# Patient Record
Sex: Female | Born: 1950 | Race: White | Hispanic: No | Marital: Married | Smoking: Former smoker
Health system: Southern US, Community
[De-identification: ages and names within clinical notes are randomized; demographics above are authoritative.]

## PROBLEM LIST (undated history)

## (undated) DIAGNOSIS — C4490 Unspecified malignant neoplasm of skin, unspecified: Secondary | ICD-10-CM

## (undated) DIAGNOSIS — C50411 Malignant neoplasm of upper-outer quadrant of right female breast: Secondary | ICD-10-CM

## (undated) DIAGNOSIS — I2510 Atherosclerotic heart disease of native coronary artery without angina pectoris: Secondary | ICD-10-CM

## (undated) DIAGNOSIS — Z9221 Personal history of antineoplastic chemotherapy: Secondary | ICD-10-CM

## (undated) DIAGNOSIS — E119 Type 2 diabetes mellitus without complications: Secondary | ICD-10-CM

## (undated) DIAGNOSIS — C50919 Malignant neoplasm of unspecified site of unspecified female breast: Secondary | ICD-10-CM

## (undated) DIAGNOSIS — R011 Cardiac murmur, unspecified: Secondary | ICD-10-CM

## (undated) DIAGNOSIS — Z923 Personal history of irradiation: Secondary | ICD-10-CM

## (undated) DIAGNOSIS — I10 Essential (primary) hypertension: Secondary | ICD-10-CM

## (undated) HISTORY — DX: Malignant neoplasm of upper-outer quadrant of right female breast: C50.411

## (undated) HISTORY — DX: Essential (primary) hypertension: I10

## (undated) HISTORY — DX: Type 2 diabetes mellitus without complications: E11.9

## (undated) HISTORY — DX: Unspecified malignant neoplasm of skin, unspecified: C44.90

---

## 2012-08-04 ENCOUNTER — Telehealth (HOSPITAL_COMMUNITY): Payer: Self-pay | Admitting: Cardiac Rehabilitation

## 2012-08-22 ENCOUNTER — Other Ambulatory Visit: Payer: Self-pay | Admitting: Family Medicine

## 2012-08-22 DIAGNOSIS — R011 Cardiac murmur, unspecified: Secondary | ICD-10-CM

## 2012-08-27 ENCOUNTER — Ambulatory Visit (HOSPITAL_COMMUNITY): Payer: Self-pay

## 2012-09-03 ENCOUNTER — Encounter: Payer: Self-pay | Admitting: Internal Medicine

## 2012-09-03 ENCOUNTER — Ambulatory Visit (INDEPENDENT_AMBULATORY_CARE_PROVIDER_SITE_OTHER): Payer: PRIVATE HEALTH INSURANCE | Admitting: Internal Medicine

## 2012-09-03 VITALS — BP 148/86 | HR 78 | Ht 62.0 in | Wt 183.1 lb

## 2012-09-03 DIAGNOSIS — R011 Cardiac murmur, unspecified: Secondary | ICD-10-CM

## 2012-09-03 NOTE — Patient Instructions (Addendum)
We will call you with echo appoinment.\ Also an appointment with Dr Rennis Golden

## 2012-09-04 ENCOUNTER — Encounter: Payer: Self-pay | Admitting: Internal Medicine

## 2012-09-04 DIAGNOSIS — R011 Cardiac murmur, unspecified: Secondary | ICD-10-CM | POA: Insufficient documentation

## 2012-09-04 NOTE — Progress Notes (Signed)
  OFFICE NOTE  Chief Complaint:  Murmur  Primary Care Physician: Delorse Lek, MD  HPI:  Darlene Kelly is a pleasant 62 year old female, with a history of heart murmur dating back to her youth. Recently she was referred by Dr. Electa Sniff for worsening intensity of the murmur and some lower extremity edema which is new.  She also has a history of dyslipidemia, hypertension and diabetes. She has not reported any new or worsening shortness of breath, chest pain, palpitations, presyncope, or syncopal symptoms. She denies any history of orthopnea, PND or other heart failure symptoms. There is no family history of sudden cardiac death.  PMHx:  History reviewed. No pertinent past medical history.  History reviewed. No pertinent past surgical history.  FAMHx:  No family history on file.  SOCHx:   reports that she quit smoking about 26 years ago. Her smoking use included Cigarettes. She has a 6 pack-year smoking history. She has never used smokeless tobacco. She reports that she drinks about 1.8 ounces of alcohol per week. She reports that she does not use illicit drugs.  ALLERGIES:  Allergies  Allergen Reactions  . Lipitor (Atorvastatin) Other (See Comments)    Bad leg cramps    ROS: A comprehensive review of systems was negative except for: Cardiovascular: positive for Murmur  HOME MEDS: Current Outpatient Prescriptions  Medication Sig Dispense Refill  . aspirin EC 81 MG tablet Take 81 mg by mouth daily.      Marland Kitchen MELATONIN PO Take by mouth at bedtime.      . metFORMIN (GLUCOPHAGE) 1000 MG tablet Take 1,000 mg by mouth 2 (two) times daily with a meal.      . metoprolol succinate (TOPROL-XL) 100 MG 24 hr tablet Take 100 mg by mouth daily. Take with or immediately following a meal.      . Omega-3 Fatty Acids (FISH OIL PO) Take by mouth.      . rosuvastatin (CRESTOR) 5 MG tablet Take 5 mg by mouth daily.       No current facility-administered medications for this visit.     LABS/IMAGING: No results found for this or any previous visit (from the past 48 hour(s)). No results found.  VITALS: BP 148/86  Pulse 78  Ht 5\' 2"  (1.575 m)  Wt 183 lb 1.6 oz (83.054 kg)  BMI 33.48 kg/m2  EXAM: General appearance: alert and no distress Neck: no adenopathy, no carotid bruit, no JVD, supple, symmetrical, trachea midline and thyroid not enlarged, symmetric, no tenderness/mass/nodules Lungs: clear to auscultation bilaterally Heart: S1, S2 normal and systolic murmur: early systolic 3/6, crescendo at 2nd right intercostal space Abdomen: soft, non-tender; bowel sounds normal; no masses,  no organomegaly Extremities: extremities normal, atraumatic, no cyanosis or edema Pulses: 2+ and symmetric Skin: Skin color, texture, turgor normal. No rashes or lesions Neurologic: Grossly normal  EKG: Normal sinus rhythm at 70  ASSESSMENT: 1. Murmur  PLAN: 1.   Darlene Kelly has a loud murmur, which is nonphysiologic. It does sound mitral, however there is also a louder intensity over the tricuspid area. This may represent 2 different murmurs. Would like to obtain an echocardiogram to rule out any significant degenerative valve disease, especially with her lower extremity swelling. We'll plan to see her back to discuss results in a few weeks. Thanks again for the referral.  Chrystie Nose, MD, [facility] Attending Cardiologist [facility]  HILTY,Kenneth C 09/04/2012, 8:07 PM

## 2012-09-08 NOTE — Addendum Note (Signed)
Addended by: Barrie Dunker on: 09/08/2012 05:07 PM   Modules accepted: Orders

## 2013-08-26 ENCOUNTER — Telehealth (HOSPITAL_COMMUNITY): Payer: Self-pay | Admitting: *Deleted

## 2013-09-22 ENCOUNTER — Ambulatory Visit: Payer: PRIVATE HEALTH INSURANCE | Admitting: Internal Medicine

## 2014-04-02 DIAGNOSIS — Z9221 Personal history of antineoplastic chemotherapy: Secondary | ICD-10-CM

## 2014-04-02 HISTORY — DX: Personal history of antineoplastic chemotherapy: Z92.21

## 2014-07-07 ENCOUNTER — Other Ambulatory Visit: Payer: Self-pay | Admitting: Family Medicine

## 2014-07-07 DIAGNOSIS — N6310 Unspecified lump in the right breast, unspecified quadrant: Secondary | ICD-10-CM

## 2014-07-09 ENCOUNTER — Other Ambulatory Visit: Payer: Self-pay | Admitting: Family Medicine

## 2014-07-09 ENCOUNTER — Ambulatory Visit
Admission: RE | Admit: 2014-07-09 | Discharge: 2014-07-09 | Disposition: A | Discharge status: Home / Self Care | Payer: 59 | Source: Ambulatory Visit | Attending: Family Medicine | Admitting: Family Medicine

## 2014-07-09 DIAGNOSIS — N6310 Unspecified lump in the right breast, unspecified quadrant: Secondary | ICD-10-CM

## 2014-07-12 ENCOUNTER — Other Ambulatory Visit: Payer: Self-pay | Admitting: Family Medicine

## 2014-07-12 DIAGNOSIS — N6310 Unspecified lump in the right breast, unspecified quadrant: Secondary | ICD-10-CM

## 2014-07-13 ENCOUNTER — Ambulatory Visit
Admission: RE | Admit: 2014-07-13 | Discharge: 2014-07-13 | Disposition: A | Discharge status: Home / Self Care | Payer: 59 | Source: Ambulatory Visit | Attending: Family Medicine | Admitting: Family Medicine

## 2014-07-13 DIAGNOSIS — N6310 Unspecified lump in the right breast, unspecified quadrant: Secondary | ICD-10-CM

## 2014-07-13 HISTORY — PX: BREAST BIOPSY: SHX20

## 2014-07-14 ENCOUNTER — Other Ambulatory Visit: Payer: Self-pay | Admitting: Family Medicine

## 2014-07-14 ENCOUNTER — Ambulatory Visit
Admission: RE | Admit: 2014-07-14 | Discharge: 2014-07-14 | Disposition: A | Discharge status: Home / Self Care | Payer: 59 | Source: Ambulatory Visit | Attending: Family Medicine | Admitting: Family Medicine

## 2014-07-14 DIAGNOSIS — R928 Other abnormal and inconclusive findings on diagnostic imaging of breast: Secondary | ICD-10-CM

## 2014-07-15 ENCOUNTER — Other Ambulatory Visit: Payer: Self-pay | Admitting: Family Medicine

## 2014-07-15 DIAGNOSIS — C50911 Malignant neoplasm of unspecified site of right female breast: Secondary | ICD-10-CM

## 2014-07-16 ENCOUNTER — Encounter: Payer: Self-pay | Admitting: *Deleted

## 2014-07-16 ENCOUNTER — Telehealth: Payer: Self-pay | Admitting: *Deleted

## 2014-07-16 DIAGNOSIS — C50411 Malignant neoplasm of upper-outer quadrant of right female breast: Secondary | ICD-10-CM

## 2014-07-16 DIAGNOSIS — Z170 Estrogen receptor positive status [ER+]: Secondary | ICD-10-CM

## 2014-07-16 HISTORY — DX: Malignant neoplasm of upper-outer quadrant of right female breast: C50.411

## 2014-07-16 NOTE — Telephone Encounter (Signed)
Confirmed [facility] for 07/21/14 at 0830 .  Instructions and contact information given.

## 2014-07-20 ENCOUNTER — Ambulatory Visit
Admission: RE | Admit: 2014-07-20 | Discharge: 2014-07-20 | Disposition: A | Discharge status: Home / Self Care | Payer: 59 | Source: Ambulatory Visit | Attending: Family Medicine | Admitting: Family Medicine

## 2014-07-20 DIAGNOSIS — C50911 Malignant neoplasm of unspecified site of right female breast: Secondary | ICD-10-CM

## 2014-07-21 ENCOUNTER — Ambulatory Visit: Payer: 59

## 2014-07-21 ENCOUNTER — Other Ambulatory Visit: Payer: Self-pay | Admitting: General Surgery

## 2014-07-21 ENCOUNTER — Ambulatory Visit: Payer: 59 | Attending: General Surgery | Admitting: Physical Therapy

## 2014-07-21 ENCOUNTER — Other Ambulatory Visit (HOSPITAL_BASED_OUTPATIENT_CLINIC_OR_DEPARTMENT_OTHER): Payer: 59

## 2014-07-21 ENCOUNTER — Encounter: Payer: Self-pay | Admitting: *Deleted

## 2014-07-21 ENCOUNTER — Ambulatory Visit
Admission: RE | Admit: 2014-07-21 | Discharge: 2014-07-21 | Disposition: A | Discharge status: Home / Self Care | Payer: 59 | Source: Ambulatory Visit | Attending: Radiation Oncology | Admitting: Radiation Oncology

## 2014-07-21 ENCOUNTER — Ambulatory Visit (HOSPITAL_BASED_OUTPATIENT_CLINIC_OR_DEPARTMENT_OTHER): Payer: 59 | Admitting: Oncology

## 2014-07-21 ENCOUNTER — Encounter: Payer: Self-pay | Admitting: Physical Therapy

## 2014-07-21 ENCOUNTER — Encounter: Payer: Self-pay | Admitting: Oncology

## 2014-07-21 ENCOUNTER — Encounter: Payer: Self-pay | Admitting: Skilled Nursing Facility1

## 2014-07-21 VITALS — BP 168/76 | HR 74 | Temp 98.6°F | Resp 18 | Ht 62.0 in | Wt 176.8 lb

## 2014-07-21 DIAGNOSIS — E119 Type 2 diabetes mellitus without complications: Secondary | ICD-10-CM | POA: Diagnosis not present

## 2014-07-21 DIAGNOSIS — C50411 Malignant neoplasm of upper-outer quadrant of right female breast: Secondary | ICD-10-CM

## 2014-07-21 DIAGNOSIS — R011 Cardiac murmur, unspecified: Secondary | ICD-10-CM

## 2014-07-21 DIAGNOSIS — E7800 Pure hypercholesterolemia, unspecified: Secondary | ICD-10-CM | POA: Insufficient documentation

## 2014-07-21 DIAGNOSIS — R293 Abnormal posture: Secondary | ICD-10-CM | POA: Diagnosis not present

## 2014-07-21 DIAGNOSIS — I10 Essential (primary) hypertension: Secondary | ICD-10-CM

## 2014-07-21 DIAGNOSIS — E785 Hyperlipidemia, unspecified: Secondary | ICD-10-CM

## 2014-07-21 LAB — COMPREHENSIVE METABOLIC PANEL (CC13)
ALT: 14 U/L (ref 0–55)
ANION GAP: 15 meq/L — AB (ref 3–11)
AST: 15 U/L (ref 5–34)
Albumin: 4.1 g/dL (ref 3.5–5.0)
Alkaline Phosphatase: 66 U/L (ref 40–150)
BUN: 15.3 mg/dL (ref 7.0–26.0)
CO2: 23 mEq/L (ref 22–29)
Calcium: 10 mg/dL (ref 8.4–10.4)
Chloride: 105 mEq/L (ref 98–109)
Creatinine: 0.8 mg/dL (ref 0.6–1.1)
EGFR: 82 mL/min/{1.73_m2} — ABNORMAL LOW (ref >90)
Glucose: 163 mg/dl — ABNORMAL HIGH (ref 70–140)
POTASSIUM: 4.7 meq/L (ref 3.5–5.1)
Sodium: 143 mEq/L (ref 136–145)
TOTAL PROTEIN: 7.4 g/dL (ref 6.4–8.3)
Total Bilirubin: 0.38 mg/dL (ref 0.20–1.20)

## 2014-07-21 LAB — CBC WITH DIFFERENTIAL/PLATELET
BASO%: 0.2 % (ref 0.0–2.0)
BASOS ABS: 0 10*3/uL (ref 0.0–0.1)
EOS%: 0.7 % (ref 0.0–7.0)
Eosinophils Absolute: 0 10*3/uL (ref 0.0–0.5)
HCT: 40.7 % (ref 34.8–46.6)
HGB: 13.3 g/dL (ref 11.6–15.9)
LYMPH#: 1.1 10*3/uL (ref 0.9–3.3)
LYMPH%: 18.8 % (ref 14.0–49.7)
MCH: 26.9 pg (ref 25.1–34.0)
MCHC: 32.7 g/dL (ref 31.5–36.0)
MCV: 82.2 fL (ref 79.5–101.0)
MONO#: 0.5 10*3/uL (ref 0.1–0.9)
MONO%: 7.6 % (ref 0.0–14.0)
NEUT#: 4.4 10*3/uL (ref 1.5–6.5)
NEUT%: 72.7 % (ref 38.4–76.8)
PLATELETS: 224 10*3/uL (ref 145–400)
RBC: 4.95 10*6/uL (ref 3.70–5.45)
RDW: 14.9 % — AB (ref 11.2–14.5)
WBC: 6.1 10*3/uL (ref 3.9–10.3)

## 2014-07-21 NOTE — Therapy (Signed)
[place], [place], [address] Phone: [phone]   Fax:  [phone]  Physical Therapy Evaluation  Patient Details  Name: Darlene Kelly MRN: [id number] Date of Birth: 1950/12/15 Referring Provider:  [name], MD  Encounter Date: 07/21/2014      PT End of Session - 07/21/14 1044    Visit Number 1   Number of Visits 1   PT Start Time 1000   PT Stop Time 1030   PT Time Calculation (min) 30 min   Activity Tolerance Patient tolerated treatment well   Behavior During Therapy [facility] for tasks assessed/performed      Past Medical History  Diagnosis Date  . Breast cancer of upper-outer quadrant of right female breast 07/16/2014  . Diabetes mellitus without complication   . Hypertension   . Skin cancer     History reviewed. No pertinent past surgical history.  There were no vitals filed for this visit.  Visit Diagnosis:  Carcinoma of upper-outer quadrant of right female breast - Plan: PT plan of care cert/re-cert  Abnormal posture - Plan: PT plan of care cert/re-cert      Subjective Assessment - 07/21/14 0932    Subjective Patient is being seen today for a baseline assessment related to her newly diagnosed right breast cancer.   Pertinent History Patient diagnosed 07/13/14 with right upper outer quadrant measuring 5 cm on MRI.  Her mass is 50% ER positive, PR negative, and HER2 negative with a Ki67  of 40%.  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   Patient Stated Goals Reduce lymphedema risk; learn post op shoulder ROM   Currently in Pain? No/denies            [facility] PT Assessment - 07/21/14 0001    Assessment   Medical Diagnosis Right breast cancer   Onset Date 07/14/14   Precautions   Precautions Other (comment)  Active right breast cancer   Restrictions   Weight Bearing Restrictions No   Balance Screen   Has the patient fallen in the past 6 months No   Has the patient had a  decrease in activity level because of a fear of falling?  No   Is the patient reluctant to leave their home because of a fear of falling?  No   Home Environment   Living Enviornment Private residence   Living Arrangements Spouse/significant other   Prior Function   Level of Independence Independent with basic ADLs   Vocation Retired  from [employer] She walks 30-60 minutes 4x/wk   Cognition   Overall Cognitive Status Within Functional Limits for tasks assessed   Posture/Postural Control   Posture/Postural Control Postural limitations   Postural Limitations Rounded Shoulders;Forward head   ROM / Strength   AROM / PROM / Strength AROM;Strength   AROM   AROM Assessment Site Shoulder   Right/Left Shoulder Right;Left   Right Shoulder Extension 60 Degrees   Right Shoulder Flexion 160 Degrees   Right Shoulder ABduction 167 Degrees   Right Shoulder Internal Rotation 68 Degrees   Right Shoulder External Rotation 87 Degrees   Left Shoulder Extension 65 Degrees   Left Shoulder Flexion 152 Degrees   Left Shoulder ABduction 162 Degrees   Left Shoulder Internal Rotation 72 Degrees   Left Shoulder External Rotation 84 Degrees   Strength   Overall Strength Within functional limits for tasks performed           LYMPHEDEMA/ONCOLOGY QUESTIONNAIRE -  07/21/14 1041    Type   Cancer Type Right breast   Lymphedema Assessments   Lymphedema Assessments Upper extremities   Right Upper Extremity Lymphedema   10 cm Proximal to Olecranon Process 31.6 cm   Olecranon Process 26.6 cm   10 cm Proximal to Ulnar Styloid Process 23 cm   Just Proximal to Ulnar Styloid Process 16.2 cm   Across Hand at [employer] 18.9 cm   At [place] of 2nd Digit 6.8 cm   Left Upper Extremity Lymphedema   10 cm Proximal to Olecranon Process 33 cm   Olecranon Process 26.5 cm   10 cm Proximal to Ulnar Styloid Process 23.7 cm   Just Proximal to Ulnar Styloid Process 16.7 cm   Across Hand at  [employer] 19.3 cm   At [place] of 2nd Digit 6.6 cm      Patient was instructed today in a home exercise program today for post op shoulder range of motion. These included active assist shoulder flexion in sitting, scapular retraction, wall walking with shoulder abduction, and hands behind head external rotation.  She was encouraged to do these twice a day, holding 3 seconds and repeating 5 times when permitted by her physician.         PT Education - 07/21/14 1043    Education provided Yes   Education Details Lymphedema risk reduction and post op shoulder ROM HEP   Person(s) Educated Patient;Spouse   Methods Explanation;Demonstration;Handout   Comprehension Verbalized understanding;Returned demonstration              Breast Clinic Goals - 07/21/14 1057    Patient will be able to verbalize understanding of pertinent lymphedema risk reduction practices relevant to her diagnosis specifically related to skin care.   Time 1   Period Days   Status Achieved   Patient will be able to return demonstrate and/or verbalize understanding of the post-op home exercise program related to regaining shoulder range of motion.   Time 1   Period Days   Status Achieved   Patient will be able to verbalize understanding of the importance of attending the postoperative After Breast Cancer Class for further lymphedema risk reduction education and therapeutic exercise.   Time 1   Period Days   Status Achieved              Plan - 07/21/14 1044    Clinical Impression Statement Patient was seen today for a baseline assessment of her newly diagnosed right breast cancer.  Her mass measures 5 cm in size.  She is planning to undergo neoadjuvant chemotherapy, have a right lumpectomy with a sentinel node biopsy (unless she opts to have a bilateral mastectomy if her genetic test comes back positive), and then radiation and anti-estrogen.   Pt will benefit from skilled therapeutic intervention in order  to improve on the following deficits Decreased range of motion;Increased edema;Decreased knowledge of precautions;Impaired UE functional use;Pain;Decreased strength   Rehab Potential Excellent   Clinical Impairments Affecting Rehab Potential none   PT Frequency One time visit   PT Treatment/Interventions Therapeutic exercise;Patient/family education   Consulted and Agree with Plan of Care Patient     Patient will follow up at outpatient cancer rehab if needed following surgery.  If the patient requires physical therapy at that time, a specific plan will be dictated and sent to the referring physician for approval. The patient was educated today on appropriate basic range of motion exercises to begin post operatively and  the importance of attending the After Breast Cancer class following surgery.  Patient was educated today on lymphedema risk reduction practices as it pertains to recommendations that will benefit the patient immediately following surgery.  She verbalized good understanding.  No additional physical therapy is indicated at this time.       Problem List Patient Active Problem List   Diagnosis Date Noted  . Diabetes mellitus type II, non insulin dependent 07/21/2014  . Essential hypertension 07/21/2014  . Cardiac murmur 07/21/2014  . Hyperlipidemia 07/21/2014  . Breast cancer of upper-outer quadrant of right female breast 07/16/2014    Annia Friendly, PT 07/21/2014, 10:59 AM  [place], [place], [address] Phone: [phone]   Fax:  [phone]

## 2014-07-21 NOTE — Progress Notes (Signed)
[place]  Telephone:(336) [phone] Fax:(336) [phone]     ID: Kynzleigh Bandel DOB: 1950/12/11  MR#: [id number]  WTU#:[id number]  Patient Care Team: [name], MD as PCP - General (Family Medicine) [name], MD as Consulting Physician (General Surgery) [name], MD as Consulting Physician (Oncology) [name], MD as Consulting Physician (Radiation Oncology) [name], RN as Registered Nurse [name], RN as Registered Nurse [name], MD as Referring Physician (Cardiology) [name], MD as Referring Physician (Dermatology) PCP: [name], MD OTHER MD:  CHIEF COMPLAINT: Estrogen receptor positive breast cancer  CURRENT TREATMENT: Neoadjuvant chemotherapy   BREAST CANCER HISTORY: Kamoni first noted a change in her right breast September 2015. At that time however she was consumed by the care of her mother-in-law, who had severe Alzheimer's disease. She finally passed in January 2016, but Janautica did not share the information regarding the right breast with her husband until late March 2016. At that point she was set up for bilateral diagnostic mammography with tomography at the breast Center, for 11/20/2014. This found the breast density to be category B. In the upper outer quadrant of the right breast there was a 4 cm irregular spiculated mass. There were no other findings of concern. The mass was palpable and firm, and by ultrasound measured 3.3 cm. The right axilla was negative sonographically.  Biopsy of the right breast mass in question was obtained for 03/22/2015. 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  On 07/20/2014 the patient underwent bilateral breast MRI. This measured the large irregular enhancing mass in the right breast at the 10:00 position at  5.0 cm. There were no additional worrisome findings in the right breast, and no findings in the left breast or any abnormal appearing lymph nodes.  The patient's subsequent history is as detailed below  INTERVAL HISTORY: Anniemae was evaluated at the multidisciplinary breast cancer clinic 07/21/2014 accompanied by her husband bruits. Her case was also presented in the multidisciplinary breast cancer conference that same morning. At that conference a preliminary plan was formulated to include neoadjuvant chemotherapy, followed by surgery, followed by radiation, followed by anti-estrogens.  REVIEW OF SYSTEMS: Aside from the mass itself, there were no specific symptoms leading to the April mammogram. The patient denies unusual headaches, visual changes, nausea, vomiting, stiff neck, dizziness, or gait imbalance. There has been no cough, phlegm production, or pleurisy, no chest pain or pressure, and no change in bowel or bladder habits. The patient denies fever, rash, bleeding, unexplained fatigue or unexplained weight loss. She rarely experiences palpitations and these have been evaluated by her cardiologist. Her murmur is asymptomatic and does not require prophylactic antibiotics prior to dental procedures. She tells me her blood sugars are well-controlled. For exercise she walks a mile and about 4 times a week. A detailed review of systems was otherwise entirely negative.  PAST MEDICAL HISTORY: Past Medical History  Diagnosis Date  . Breast cancer of upper-outer quadrant of right female breast 07/16/2014  . Diabetes mellitus without complication   . Hypertension   . Skin cancer     PAST SURGICAL HISTORY: History reviewed. No pertinent past surgical history.  FAMILY HISTORY Family History  Problem Relation Age of Onset  . Lung cancer Father    the patient's father died from lung cancer in the setting of tobacco abuse at the  age of 53. The patient's mother died from COPD at the age of 59. The  patient has 3 brothers, no sisters. There is no history of breast or ovarian cancer in the family.  GYNECOLOGIC HISTORY:  No LMP recorded. Menarche age 64, first live birth age 64. She is GX P3. She went through the change of life in her late 9s. She did not take hormone replacement.  SOCIAL HISTORY:  Hina worked for [employer], but is now retired. Her husband [name] is Programmer, systems for a [employer]. They have been married more than 40 years. Son [name] "[name] was "'s Tanaiya Kolarik is a climbing Acupuncturist in [place]. Son [name] "map" Britnay Magnussen is a Dealer in [place]. Daughter Vickki Igou is a branch bank or for [employer] in [place]. The patient has 3 grandchildren.    ADVANCED DIRECTIVES: In place   HEALTH MAINTENANCE: History  Substance Use Topics  . Smoking status: Former Smoker -- 0.50 packs/day for 12 years    Types: Cigarettes    Quit date: 04/02/1986  . Smokeless tobacco: Never Used  . Alcohol Use: 1.8 oz/week    3 Glasses of wine per week     Colonoscopy: Never  PAP:  Bone density: Never  Lipid panel:  Allergies  Allergen Reactions  . Latex Itching, Dermatitis and Rash  . Lipitor [Atorvastatin] Other (See Comments)    Bad leg cramps    Current Outpatient Prescriptions  Medication Sig Dispense Refill  . aspirin EC 81 MG tablet Take 81 mg by mouth daily.    Marland Kitchen lisinopril (PRINIVIL,ZESTRIL) 10 MG tablet Take 10 mg by mouth daily.  6  . MELATONIN PO Take by mouth at bedtime.    . metFORMIN (GLUCOPHAGE) 1000 MG tablet Take 1,000 mg by mouth 2 (two) times daily with a meal.    . metoprolol succinate (TOPROL-XL) 100 MG 24 hr tablet Take 100 mg by mouth daily. Take with or immediately following a meal.    . Omega-3 Fatty Acids (FISH OIL PO) Take by mouth.    . rosuvastatin (CRESTOR) 5 MG tablet Take 5 mg by mouth daily.     No current facility-administered medications for this visit.    OBJECTIVE: Middle-aged white woman  without appears stated age 64 Vitals:   07/21/14 0841  BP: 168/76  Pulse: 74  Temp: 98.6 F (37 C)  Resp: 18     Body mass index is 32.33 kg/(m^2).    ECOG FS:0 - Asymptomatic  Ocular: Sclerae unicteric, pupils equal, round and reactive to light Ear-nose-throat: Oropharynx clear, dentition in good repair Lymphatic: No cervical or supraclavicular adenopathy Lungs no rales or rhonchi, good excursion bilaterally Heart regular rate and rhythm, 3/6 midsystolic murmur noted Abd soft, obese, nontender, positive bowel sounds MSK no focal spinal tenderness, no joint edema Neuro: non-focal, well-oriented, appropriate affect Breasts: In the upper outer quadrant of the right breast there is a firm movable mass which is hard to delineate but measures in excess of 4 cm. There is no associated erythema or nipple change. The rest of the breast is unremarkable as is the right axillae. The left breast is unremarkable as well   LAB RESULTS:  CMP     Component Value Date/Time   NA 143 07/21/2014 0818   K 4.7 07/21/2014 0818   CO2 23 07/21/2014 0818   GLUCOSE 163* 07/21/2014 0818   BUN 15.3 07/21/2014 0818   CREATININE 0.8 07/21/2014 0818   CALCIUM 10.0  07/21/2014 0818   PROT 7.4 07/21/2014 0818   ALBUMIN 4.1 07/21/2014 0818   AST 15 07/21/2014 0818   ALT 14 07/21/2014 0818   ALKPHOS 66 07/21/2014 0818   BILITOT 0.38 07/21/2014 0818    INo results found for: SPEP, UPEP  Lab Results  Component Value Date   WBC 6.1 07/21/2014   NEUTROABS 4.4 07/21/2014   HGB 13.3 07/21/2014   HCT 40.7 07/21/2014   MCV 82.2 07/21/2014   PLT 224 07/21/2014      Chemistry      Component Value Date/Time   NA 143 07/21/2014 0818   K 4.7 07/21/2014 0818   CO2 23 07/21/2014 0818   BUN 15.3 07/21/2014 0818   CREATININE 0.8 07/21/2014 0818      Component Value Date/Time   CALCIUM 10.0 07/21/2014 0818   ALKPHOS 66 07/21/2014 0818   AST 15 07/21/2014 0818   ALT 14 07/21/2014 0818   BILITOT 0.38  07/21/2014 0818       No results found for: LABCA2  No components found for: LABCA125  No results for input(s): INR in the last 168 hours.  Urinalysis No results found for: COLORURINE, APPEARANCEUR, LABSPEC, PHURINE, GLUCOSEU, HGBUR, BILIRUBINUR, KETONESUR, PROTEINUR, UROBILINOGEN, NITRITE, LEUKOCYTESUR  STUDIES: Mr Breast Bilateral W Wo Contrast  07/20/2014   CLINICAL DATA:  Recently diagnosed grade 3 right breast invasive ductal carcinoma. Preop evaluation.  LABS:  BUN and creatinine were obtained on site at [facility] at  [address]  Results:  BUN 16 mg/dL,  Creatinine 0.6 mg/dL.  EXAM: BILATERAL BREAST MRI WITH AND WITHOUT CONTRAST  TECHNIQUE: Multiplanar, multisequence MR images of both breasts were obtained prior to and following the intravenous administration of 15 ml of MultiHance  THREE-DIMENSIONAL MR IMAGE RENDERING ON INDEPENDENT WORKSTATION:  Three-dimensional MR images were rendered by post-processing of the original MR data on an independent workstation. The three-dimensional MR images were interpreted, and findings are reported in the following complete MRI report for this study. Three dimensional images were evaluated at the independent DynaCad workstation  COMPARISON:  Mammogram and ultrasound dated 07/09/2014.  FINDINGS: Breast composition: b.  Scattered fibroglandular tissue.  Background parenchymal enhancement: Minimal  Right breast: There is a large irregular enhancing mass located within the middle 1/3 of the right breast at the 10 o'clock position which measures 5.0 x 4.3 x 3.5 cm in size and corresponds to the recently diagnosed invasive ductal carcinoma. There is signal void associated with the clip within the anterior portion of the mass. There are no additional worrisome enhancing foci within the right breast.  Left breast: No mass or abnormal enhancement.  Lymph nodes: No abnormal appearing lymph nodes.  Ancillary findings:  None.  IMPRESSION: 5.0 cm  irregular enhancing mass located within the right breast at 10 o'clock position corresponding to the recently diagnosed invasive ductal carcinoma. No additional findings.  RECOMMENDATION: Treatment plan.  BI-RADS CATEGORY  6: Known biopsy-proven malignancy.   Electronically Signed   By: Altamese Cabal M.D.   On: 07/20/2014 11:04   Mm Digital Diagnostic Unilat R  07/13/2014   CLINICAL DATA:  Post ultrasound-guided right breast biopsy  EXAM: DIAGNOSTIC right MAMMOGRAM POST ULTRASOUND BIOPSY  COMPARISON:  Previous exam(s).  FINDINGS: Mammographic images were obtained following ultrasound guided biopsy of right breast mass 10 o'clock location. The ribbon shaped clip is appropriately located at the biopsy portion of the mass anteriorly.  IMPRESSION: Appropriate ribbon shaped clip location, right breast mass 10 o'clock location biopsy  site.  Final Assessment: Post Procedure Mammograms for Marker Placement   Electronically Signed   By: Conchita Paris M.D.   On: 07/13/2014 10:45   Mm Radiologist Eval And Mgmt  07/14/2014   EXAM: ESTABLISHED PATIENT OFFICE VISIT - LEVEL II  CHIEF COMPLAINT: The patient presented initially for evaluation of a palpable right breast mass in the 10 o'clock location. Imaging revealed a 3 cm irregular spiculated suspicious mass in that location and biopsy was performed 07/13/2014.  Current Pain Level: 0  HISTORY OF PRESENT ILLNESS: The patient presents today for discussion of pathology results after biopsy.  EXAM: At physical exam, there is mild erythema at the right breast 10 o'clock location biopsy site. The bandages are clean and dry and no ecchymosis or hematoma formation is identified.  PATHOLOGY: Pathology demonstrates invasive ductal carcinoma, grade 3, which is concordant with the imaging appearance.  ASSESSMENT AND PLAN: ASSESSMENT AND PLAN Multi disciplinary [place] referral has been arranged 07/21/2014. The patient will be contacted regarding breast MRI scheduling, and  an appointment will be made by telephone. All questions were answered. She was provided with educational materials. She was asked to contact us if the erythema increases or if there is any other clinical problem in the meantime. Infection would be unlikely so soon after biopsy, and contact dermatitis reaction to the chlorhexidine preparation or Band-Aids is possible. Conservative treatment is recommended unless there is any worsening.   Electronically Signed   By: Conchita Paris M.D.   On: 07/14/2014 11:06   US Breast Ltd Uni Right Inc Axilla  07/09/2014   CLINICAL DATA:  Patient presents for evaluation of palpable abnormality within the upper-outer right breast.  EXAM: DIGITAL DIAGNOSTIC RIGHT MAMMOGRAM WITH 3D TOMOSYNTHESIS WITH CAD  ULTRASOUND RIGHT BREAST  COMPARISON:  None.  ACR Breast Density Category b: There are scattered areas of fibroglandular density.  FINDINGS: Within the upper-outer right breast middle depth underlying the palpable marker is a 4 cm irregular spiculated mass. No additional concerning masses, calcifications or architectural distortion identified within either breast.  Mammographic images were processed with CAD.  On physical exam, I palpate a firm mass within the upper-outer right breast.  Targeted ultrasound is performed, showing a 3.2 x 3.3 x 3.0 cm irregular hypoechoic mass within the right breast 10 o'clock position 6 cm from the nipple with internal color vascularity. No definite enlarged right axillary lymph nodes are able to be identified with ultrasound.  IMPRESSION: Large suspicious right breast mass.  RECOMMENDATION: Ultrasound-guided core needle biopsy right breast mass.  This has been scheduled for 07/13/2014 at 10 a.m.  I have discussed the findings and recommendations with the patient. Results were also provided in writing at the conclusion of the visit. If applicable, a reminder letter will be sent to the patient regarding the next appointment.  BI-RADS CATEGORY  5:  Highly suggestive of malignancy.   Electronically Signed   By: Lovey Newcomer M.D.   On: 07/09/2014 10:24   Mm Diag Breast Tomo Bilateral  07/09/2014   CLINICAL DATA:  Patient presents for evaluation of palpable abnormality within the upper-outer right breast.  EXAM: DIGITAL DIAGNOSTIC RIGHT MAMMOGRAM WITH 3D TOMOSYNTHESIS WITH CAD  ULTRASOUND RIGHT BREAST  COMPARISON:  None.  ACR Breast Density Category b: There are scattered areas of fibroglandular density.  FINDINGS: Within the upper-outer right breast middle depth underlying the palpable marker is a 4 cm irregular spiculated mass. No additional concerning masses, calcifications or architectural distortion identified within either breast.  Mammographic images were processed with CAD.  On physical exam, I palpate a firm mass within the upper-outer right breast.  Targeted ultrasound is performed, showing a 3.2 x 3.3 x 3.0 cm irregular hypoechoic mass within the right breast 10 o'clock position 6 cm from the nipple with internal color vascularity. No definite enlarged right axillary lymph nodes are able to be identified with ultrasound.  IMPRESSION: Large suspicious right breast mass.  RECOMMENDATION: Ultrasound-guided core needle biopsy right breast mass.  This has been scheduled for 07/13/2014 at 10 a.m.  I have discussed the findings and recommendations with the patient. Results were also provided in writing at the conclusion of the visit. If applicable, a reminder letter will be sent to the patient regarding the next appointment.  BI-RADS CATEGORY  5: Highly suggestive of malignancy.   Electronically Signed   By: Lovey Newcomer M.D.   On: 07/09/2014 10:24   [place] Rt Breast Bx W Loc Dev 1st Lesion Img Bx Spec US Guide  07/13/2014   CLINICAL DATA:  Suspicious right breast mass 10 o'clock location  EXAM: ULTRASOUND GUIDED right BREAST CORE NEEDLE BIOPSY  COMPARISON:  Previous exam(s).  PROCEDURE: I met with the patient and we discussed the procedure of  ultrasound-guided biopsy, including benefits and alternatives. We discussed the high likelihood of a successful procedure. We discussed the risks of the procedure including infection, bleeding, tissue injury, clip migration, and inadequate sampling. Informed written consent was given. The usual time-out protocol was performed immediately prior to the procedure.  Using sterile technique and 2% Lidocaine as local anesthetic, under direct ultrasound visualization, a 12 gauge vacuum-assisted device was used to perform biopsy of right breast mass 10 o'clock locationusing a lateral to medial approach. At the conclusion of the procedure, a ribbon shaped tissue marker clip was deployed into the biopsy cavity. Follow-up 2-view mammogram was performed and dictated separately.  IMPRESSION: Ultrasound-guided biopsy of right breast mass 10 o'clock location with ribbon shaped clip placement. Pathology is pending. No apparent complications.   Electronically Signed   By: Conchita Paris M.D.   On: 07/13/2014 11:28    ASSESSMENT: 64 y.o. [place] woman status post right breast upper outer quadrant biopsy 07/13/2014, for a clinical T3 N0, stage IIB invasive ductal carcinoma, grade 3, estrogen receptor positive, HER-2 and progesterone receptor negative, with an MIB-1 of 40%  (1) neoadjuvant chemotherapy to start 08/15/2013, consisting of doxorubicin and cyclophosphamide in dose dense fashion 4, with Neulasta support, followed by weekly paclitaxel 12  (2) breast conserving surgery with sentinel lymph node sampling to follow chemotherapy  (3) adjuvant radiation to follow surgery  (4) anti-estrogens to follow radiation  PLAN: We spent the better part of today's hour-long appointment discussing the biology of breast cancer in general, and the specifics of the patient's tumor in particular. Stephanie Coup understands that there is no difference in terms of survival between doing chemotherapy first or surgery first. She would  receive the same chemotherapy either way. In her case we recommend neoadjuvant chemotherapy because it will make the surgery easier and the cosmetic result better. S  We then discussed chemotherapy which will consist of cyclophosphamide and doxorubicin in dose dense fashion 4 followed by weekly paclitaxel 12. She has a good understanding of the possible toxicities, side effects and complications of these agents and she will also come to "chemotherapy school" to learn more about this.  She will need an echocardiogram, which we will request through Dr. Woody Seller' office. She will also need a port.  Are target start date is May 16, which means she will be done with the first part of her chemotherapy before they're July 4 week at the beach. She will start the weekly paclitaxel after that vacation.  The patient has a good understanding of the overall plan. She agrees with it. She knows the goal of treatment in her case is cure. She will call with any problems that may develop before her next visit here.  [name], MD   07/21/2014 10:05 AM Medical Oncology and Hematology [facility] [address] [place], [place] [address] Tel. [phone]    Fax. [phone]

## 2014-07-21 NOTE — Progress Notes (Signed)
  Radiation Oncology         [phone]) [phone] ________________________________  Initial outpatient Consultation - Date: 07/21/2014   Name: Darlene Kelly MRN: [id number]   DOB: 1950-08-17  REFERRING PHYSICIAN: [name], MD  DIAGNOSIS:    ICD-9-CM ICD-10-CM   1. Breast cancer of upper-outer quadrant of right female breast 174.4 C50.411     STAGE: T2N0 (Stage II)  HISTORY OF PRESENT ILLNESS::Darlene Kelly is a 64 y.o. female who presents with palpable right breast mass. This was found on mammogram and ultrasound to measure 3.6 cm abnormal density. Subsequent MRI showed 5 x 4.3 x 3.5 cm area of outer breast. Biopsy showed Grade 3 IDC. ER 50% PR negative KI67 was 40 and HER2 was negative. She was referred to me by Dr. [name] for consideration of radiation therapy in the management of her disease. She has recovered well from her biopsy. She presents today with he husband and daughter.  Past medical, social and family history were reviewed in the electronic chart. Review of symptoms was reviewed in the electronic chart. Medications were reviewed in the electronic chart.   GYN History: Menarche at age 76 No longer having periods. She doesn't use hormone replacement.  Obstetric History: Gx 3 Had her first child at 46 y/o. She is not pregnant and is not trying to get pregnant. She has used contraception for 12 years.    PHYSICAL EXAM:    07/21/14   BP 168/76 mmHg [nurse is aware of bp 168/74]   Pulse Rate 74   Resp 18   Temp 98.6 F (37 C)   Temp Source Oral   SpO2 99 %   Weight 176 lb 12.8 oz (80.196 kg)   Height 5' 2" (1.575 m)   Pain Score 0        Pleasant female. Ptotic breasts bilaterally. She has a palpable mass in the outer breast. There is no palpable axillary, cervical or supraclavicular adenopathy.   IMPRESSION: T2N0 Right breast cancer  PLAN:  We discussed the role of radiation and decreasing local failures in patients who undergo lumpectomy. We discussed the  retrospective data showing an increase in failure rates in patients even in patients who have had a pathologic complete response and did not undergo radiation. For this reason I have recommended radiation to the whole breast followed by boost to the tumor bed following her surgery. We discussed the process of simulation and the placement tattoos. We discussed possible side effects during treatment including but not limited to skin irritation darkness and fatigue. We discussed long-term effects of treatment which are extremely unlikely but possible including damage to the lungs and ribs. We discussed the low likelihood of secondary malignancies.  She was seen by medical oncology, surgery, physical therapy, and our dietician. She will be seen by our survivorship navigator following her treatment.   I spent 40 minutes  face to face with the patient and more than 50% of that time was spent in counseling and/or coordination of care.   This document serves as a record of services personally performed by [name] , MD. It was created on her behalf by Pearlie Oyster, a trained medical scribe. The creation of this record is based on the scribe's personal observations and the provider's statements to them. This document has been checked and approved by the attending provider.          ------------------------------------------------  [name], MD

## 2014-07-21 NOTE — Progress Notes (Signed)
Clinical Social Work [place] Psychosocial Distress Screening [place]  Patient completed distress screening protocol and scored a 5 on the Psychosocial Distress Thermometer which indicates moderate distress. Clinical Social Worker met with patient and patients husband in [facility] to assess for distress and other psychosocial needs. Patient stated she felt "much better" after getting information on her treatment plan and meeting with the treatment team. CSW and patient discussed common feeling and emotions when being diagnosed with cancer. CSW and patient discussed the importance of support during treatment. Patient reported a strong support system in her family and friends.  CSW informed patient of the support team and support services at [facility], and patient was agreeable to an [employer] referral. CSW provided contact information and encouraged patient to call with any questions or concerns.   ONCBCN DISTRESS SCREENING 07/21/2014  Screening Type Initial Screening  Distress experienced in past week (1-10) 5  Emotional problem type Nervousness/Anxiety;Adjusting to illness  Physician notified of physical symptoms Yes  Referral to clinical psychology No  Referral to clinical social work Yes  Referral to dietition No  Referral to financial advocate No  Referral to support programs Yes  Referral to palliative care No   Johnnye Lana, MSW, LCSW, OSW-C Clinical Social Worker [place] [phone]

## 2014-07-21 NOTE — Patient Instructions (Signed)

## 2014-07-21 NOTE — Progress Notes (Signed)
Subjective:     Patient ID: Darlene Kelly, female   DOB: 1951-03-02, 64 y.o.   MRN: [id number]  HPI   Review of Systems     Objective:   Physical Exam For the patient to understand and be given the tools to implement a healthy plant based diet during their cancer diagnosis.     Assessment:     Patient was seen today and found to be in good spirits and accompanied by her husband. Pts wt 176 pounds 5'2'' BMI 32.4. Pt does have diabetes and is being treated for hyperlipidemia. Pts current/relevant labs: crestor, lisinopril, metformin, and omega 3 fish oil. Pt expressed her distaste for salmon but states she is trying to eat it. Pt states she walks for about 30 minutes a couple times a week.     Plan:     Dietitian educated the patient on implementing a plant based diet by incorporating more plant proteins, fruits, and vegetables. As a part of a healthy routine physical activity was discussed. Dietitian also advised trying to incorporate more physical activity into her days. Dietitian also offered some recipes for salmon so it did not taste so "fishy" and assured her that tuna in a can packed in water is a great option. Other food sources as a source of omega 3 fatty acids was also discussed.  The importance of legitimate, evidence based information was discussed and examples were given. A folder of evidence based information with a focus on a plant based diet and general nutrition during cancer was given to the patient.  As a part of the continuum of care the cancer dietitian's contact information was given to the patient in the event they would like to have a follow up appointment.

## 2014-07-21 NOTE — Progress Notes (Signed)
Ms. Kumari is a very pleasant 64 y.o. female from [place], [place] with newly diagnosed grade 3 invasive ductal carcinoma of the right breast.  Biopsy results revealed the tumor's prognostic profile is ER positive, PR negative, and HER2/neu negative. Ki67 is 40%.  She presents today with her husband to the [facility]) for treatment consideration and recommendations from the breast surgeon, radiation oncologist, and medical oncologist.     I briefly met with Ms. Kuck and her husband during her [facility] visit today. We discussed the purpose of the Survivorship Clinic, which will include monitoring for recurrence, coordinating completion of age and gender-appropriate cancer screenings, promotion of overall wellness, as well as managing potential late/long-term side effects of anti-cancer treatments.    The treatment plan for Ms. Kugel will likely include chemotherapy, surgery, radiation therapy, and anti-estrogen therapy.  As of today, the intent of treatment for Ms. Lacerda is cure, therefore she will be eligible for the Survivorship Clinic upon her completion of treatment.  Her survivorship care plan (SCP) document will be drafted and updated throughout the course of her treatment trajectory. She will receive the SCP in an office visit with myself in the Survivorship Clinic once she has completed treatment.   Ms. Sant was encouraged to ask questions and all questions were answered to her satisfaction.  She was given my business card and encouraged to contact me with any concerns regarding survivorship.  I look forward to participating in her care.   Mike Craze, NP [place] [phone]

## 2014-07-21 NOTE — Progress Notes (Signed)
Checked in new pt with no financial concerns prior to seeing the dr. Informed pt if chemo is part of her treatment we will get auth from her ins as well as contact foundations that offer copay assistance for chemo if needed. She has my card for any billing questions or concerns.

## 2014-07-22 ENCOUNTER — Telehealth: Payer: Self-pay

## 2014-07-22 NOTE — Telephone Encounter (Signed)
Pt called about schedule. She thought she would start her adria/cytoxan chemo on 5/9 and her schedule showed 5/16. She is concerned b/c they are taking vacation on 7[place]2 to the beach and she was worried about chemo on the 27th June and leaving on 2 July. I assured her this would be OK.

## 2014-07-26 ENCOUNTER — Telehealth: Payer: Self-pay | Admitting: *Deleted

## 2014-07-26 ENCOUNTER — Other Ambulatory Visit: Payer: 59

## 2014-07-26 NOTE — Telephone Encounter (Signed)
Spoke with patient from [facility] 07/21/14.  She is doing well.  No questions or concerns at this time except for she stated that her appointment for her injection was the same day as her chemo.  Informed her I would get this changed.  Encouraged her to call with any needs or concerns.

## 2014-07-27 NOTE — Patient Instructions (Addendum)
Darlene Kelly  07/27/2014   Your procedure is scheduled on:08-03-2014   Report to [facility] Main  Entrance and follow signs to               [place] at 0800 AM.    Call this number if you have problems the morning of surgery [phone]   Remember:  Do not eat food or drink liquids :After Midnight.     Take these medicines the morning of surgery with A SIP OF WATER: None                               You may not have any metal on your body including hair pins and              piercings  Do not wear jewelry, make-up, lotions, powders or perfumes, deodorant             Do not wear nail polish.  Do not shave  48 hours prior to surgery.     .   Do not bring valuables to the hospital. [place].  Contacts, dentures or bridgework may not be worn into surgery. .     Patients discharged the day of surgery will not be allowed to drive home.  Name and phone number of your driver:  Special Instructions: coughing and deep breathing exercises, leg exercises              Please read over the following fact sheets you were given: _____________________________________________________________________             [facility] - Preparing for Surgery Before surgery, you can play an important role.  Because skin is not sterile, your skin needs to be as free of germs as possible.  You can reduce the number of germs on your skin by washing with CHG (chlorahexidine gluconate) soap before surgery.  CHG is an antiseptic cleaner which kills germs and bonds with the skin to continue killing germs even after washing. Please DO NOT use if you have an allergy to CHG or antibacterial soaps.  If your skin becomes reddened/irritated stop using the CHG and inform your nurse when you arrive at Short Stay. Do not shave (including legs and underarms) for at least 48 hours prior to the first CHG shower.  You may shave your  face/neck. Please follow these instructions carefully:  1.  Shower with CHG Soap the night before surgery and the  morning of Surgery.  2.  If you choose to wash your hair, wash your hair first as usual with your  normal  shampoo.  3.  After you shampoo, rinse your hair and body thoroughly to remove the  shampoo.                           4.  Use CHG as you would any other liquid soap.  You can apply chg directly  to the skin and wash                       Gently with a scrungie or clean washcloth.  5.  Apply the CHG Soap to your body ONLY FROM THE NECK DOWN.  Do not use on face/ open                           Wound or open sores. Avoid contact with eyes, ears mouth and genitals (private parts).                       Wash face,  Genitals (private parts) with your normal soap.             6.  Wash thoroughly, paying special attention to the area where your surgery  will be performed.  7.  Thoroughly rinse your body with warm water from the neck down.  8.  DO NOT shower/wash with your normal soap after using and rinsing off  the CHG Soap.                9.  Pat yourself dry with a clean towel.            10.  Wear clean pajamas.            11.  Place clean sheets on your bed the night of your first shower and do not  sleep with pets. Day of Surgery : Do not apply any lotions/deodorants the morning of surgery.  Please wear clean clothes to the hospital/surgery center.  FAILURE TO FOLLOW THESE INSTRUCTIONS MAY RESULT IN THE CANCELLATION OF YOUR SURGERY PATIENT SIGNATURE_________________________________  NURSE SIGNATURE__________________________________  ________________________________________________________________________

## 2014-07-28 ENCOUNTER — Encounter (HOSPITAL_COMMUNITY)
Admission: RE | Admit: 2014-07-28 | Discharge: 2014-07-28 | Disposition: A | Discharge status: Home / Self Care | Payer: 59 | Source: Ambulatory Visit | Attending: General Surgery | Admitting: General Surgery

## 2014-07-28 ENCOUNTER — Encounter (HOSPITAL_COMMUNITY): Payer: Self-pay

## 2014-07-28 DIAGNOSIS — C50411 Malignant neoplasm of upper-outer quadrant of right female breast: Secondary | ICD-10-CM | POA: Diagnosis not present

## 2014-07-28 DIAGNOSIS — Z01818 Encounter for other preprocedural examination: Secondary | ICD-10-CM | POA: Diagnosis not present

## 2014-07-28 HISTORY — DX: Cardiac murmur, unspecified: R01.1

## 2014-07-28 NOTE — Progress Notes (Signed)
CBC and CMP done 07/21/2014 in [facility].   EKG- 07/06/2014 in [facility]  Office visit - Dr Woody Seller- 07/06/2014 on chart  ECHO- 10/22/2013- on chart To have another ECHO- done on 54/2016 per patient at office of Dr Woody Seller.

## 2014-08-03 ENCOUNTER — Ambulatory Visit (HOSPITAL_COMMUNITY)
Admission: RE | Admit: 2014-08-03 | Discharge: 2014-08-03 | Disposition: A | Discharge status: Home / Self Care | Payer: 59 | Source: Ambulatory Visit | Attending: General Surgery | Admitting: General Surgery

## 2014-08-03 ENCOUNTER — Ambulatory Visit (HOSPITAL_COMMUNITY): Payer: 59 | Admitting: Certified Registered Nurse Anesthetist

## 2014-08-03 ENCOUNTER — Ambulatory Visit (HOSPITAL_COMMUNITY): Payer: 59

## 2014-08-03 ENCOUNTER — Encounter (HOSPITAL_COMMUNITY): Payer: Self-pay | Admitting: *Deleted

## 2014-08-03 ENCOUNTER — Encounter (HOSPITAL_COMMUNITY)
Admission: RE | Disposition: A | Discharge status: Home / Self Care | Payer: Self-pay | Source: Ambulatory Visit | Attending: General Surgery

## 2014-08-03 DIAGNOSIS — Z87891 Personal history of nicotine dependence: Secondary | ICD-10-CM | POA: Diagnosis not present

## 2014-08-03 DIAGNOSIS — E119 Type 2 diabetes mellitus without complications: Secondary | ICD-10-CM | POA: Insufficient documentation

## 2014-08-03 DIAGNOSIS — E780 Pure hypercholesterolemia: Secondary | ICD-10-CM | POA: Insufficient documentation

## 2014-08-03 DIAGNOSIS — Z79899 Other long term (current) drug therapy: Secondary | ICD-10-CM | POA: Diagnosis not present

## 2014-08-03 DIAGNOSIS — Z95828 Presence of other vascular implants and grafts: Secondary | ICD-10-CM

## 2014-08-03 DIAGNOSIS — Z09 Encounter for follow-up examination after completed treatment for conditions other than malignant neoplasm: Secondary | ICD-10-CM

## 2014-08-03 DIAGNOSIS — C50411 Malignant neoplasm of upper-outer quadrant of right female breast: Secondary | ICD-10-CM | POA: Diagnosis not present

## 2014-08-03 DIAGNOSIS — Z452 Encounter for adjustment and management of vascular access device: Secondary | ICD-10-CM | POA: Insufficient documentation

## 2014-08-03 DIAGNOSIS — Z170 Estrogen receptor positive status [ER+]: Secondary | ICD-10-CM | POA: Insufficient documentation

## 2014-08-03 DIAGNOSIS — K219 Gastro-esophageal reflux disease without esophagitis: Secondary | ICD-10-CM | POA: Insufficient documentation

## 2014-08-03 DIAGNOSIS — I10 Essential (primary) hypertension: Secondary | ICD-10-CM | POA: Insufficient documentation

## 2014-08-03 HISTORY — PX: PORTACATH PLACEMENT: SHX2246

## 2014-08-03 LAB — GLUCOSE, CAPILLARY: Glucose-Capillary: 108 mg/dL — ABNORMAL HIGH (ref 70–99)

## 2014-08-03 SURGERY — INSERTION, TUNNELED CENTRAL VENOUS DEVICE, WITH PORT
Anesthesia: General | Site: Chest

## 2014-08-03 MED ORDER — FENTANYL CITRATE (PF) 100 MCG/2ML IJ SOLN
INTRAMUSCULAR | Status: AC
  Filled 2014-08-03: qty 2

## 2014-08-03 MED ORDER — LIDOCAINE HCL (CARDIAC) 20 MG/ML IV SOLN
INTRAVENOUS | Status: DC | PRN
  Administered 2014-08-03: 50 mg via INTRAVENOUS

## 2014-08-03 MED ORDER — ONDANSETRON HCL 4 MG/2ML IJ SOLN
INTRAMUSCULAR | Status: AC
  Filled 2014-08-03: qty 2

## 2014-08-03 MED ORDER — PROPOFOL 10 MG/ML IV BOLUS
INTRAVENOUS | Status: AC
  Filled 2014-08-03: qty 20

## 2014-08-03 MED ORDER — HEPARIN SOD (PORK) LOCK FLUSH 100 UNIT/ML IV SOLN
INTRAVENOUS | Status: AC
  Filled 2014-08-03: qty 5

## 2014-08-03 MED ORDER — HEPARIN SOD (PORK) LOCK FLUSH 100 UNIT/ML IV SOLN
INTRAVENOUS | Status: DC | PRN
  Administered 2014-08-03: 500 [IU]

## 2014-08-03 MED ORDER — BUPIVACAINE-EPINEPHRINE 0.25% -1:200000 IJ SOLN
INTRAMUSCULAR | Status: DC | PRN
  Administered 2014-08-03: 19 mL

## 2014-08-03 MED ORDER — FENTANYL CITRATE (PF) 250 MCG/5ML IJ SOLN
INTRAMUSCULAR | Status: DC | PRN
  Administered 2014-08-03 (×2): 50 ug via INTRAVENOUS

## 2014-08-03 MED ORDER — CHLORHEXIDINE GLUCONATE 4 % EX LIQD: 1.0000 "application " | Freq: Once | CUTANEOUS | Status: DC

## 2014-08-03 MED ORDER — ONDANSETRON HCL 4 MG/2ML IJ SOLN
INTRAMUSCULAR | Status: DC | PRN
  Administered 2014-08-03: 4 mg via INTRAVENOUS

## 2014-08-03 MED ORDER — BUPIVACAINE HCL (PF) 0.5 % IJ SOLN
INTRAMUSCULAR | Status: AC
  Filled 2014-08-03: qty 30

## 2014-08-03 MED ORDER — MIDAZOLAM HCL 5 MG/5ML IJ SOLN
INTRAMUSCULAR | Status: DC | PRN
  Administered 2014-08-03: 2 mg via INTRAVENOUS

## 2014-08-03 MED ORDER — PROPOFOL 10 MG/ML IV BOLUS
INTRAVENOUS | Status: DC | PRN
  Administered 2014-08-03: 150 mg via INTRAVENOUS
  Administered 2014-08-03: 50 mg via INTRAVENOUS

## 2014-08-03 MED ORDER — HEPARIN SODIUM (PORCINE) 5000 UNIT/ML IJ SOLN
Freq: Once | INTRAMUSCULAR | Status: AC
  Administered 2014-08-03: 500 mL
  Filled 2014-08-03: qty 1.2

## 2014-08-03 MED ORDER — CEFAZOLIN SODIUM-DEXTROSE 2-3 GM-% IV SOLR
INTRAVENOUS | Status: AC
  Filled 2014-08-03: qty 50

## 2014-08-03 MED ORDER — HYDROMORPHONE HCL 1 MG/ML IJ SOLN: 0.2500 mg | INTRAMUSCULAR | Status: DC | PRN

## 2014-08-03 MED ORDER — MEPERIDINE HCL 50 MG/ML IJ SOLN: 6.2500 mg | INTRAMUSCULAR | Status: DC | PRN

## 2014-08-03 MED ORDER — LACTATED RINGERS IV SOLN
INTRAVENOUS | Status: DC
  Administered 2014-08-03: 1000 mL via INTRAVENOUS

## 2014-08-03 MED ORDER — BUPIVACAINE-EPINEPHRINE 0.25% -1:200000 IJ SOLN
INTRAMUSCULAR | Status: AC
  Filled 2014-08-03: qty 1

## 2014-08-03 MED ORDER — MIDAZOLAM HCL 2 MG/2ML IJ SOLN
INTRAMUSCULAR | Status: AC
Start: 2014-08-03 — End: 2014-08-03
  Filled 2014-08-03: qty 2

## 2014-08-03 MED ORDER — ONDANSETRON HCL 4 MG/2ML IJ SOLN: 4.0000 mg | Freq: Once | INTRAMUSCULAR | Status: DC | PRN

## 2014-08-03 MED ORDER — LIDOCAINE HCL 0.5 % IJ SOLN
INTRAMUSCULAR | Status: AC
  Filled 2014-08-03: qty 1

## 2014-08-03 MED ORDER — LIDOCAINE HCL (CARDIAC) 20 MG/ML IV SOLN
INTRAVENOUS | Status: AC
  Filled 2014-08-03: qty 5

## 2014-08-03 MED ORDER — HYDROCODONE-ACETAMINOPHEN 5-325 MG PO TABS: 1.0000 | ORAL_TABLET | ORAL | Status: DC | PRN

## 2014-08-03 MED ORDER — SODIUM BICARBONATE 4 % IV SOLN
INTRAVENOUS | Status: AC
  Filled 2014-08-03: qty 5

## 2014-08-03 MED ORDER — CEFAZOLIN SODIUM-DEXTROSE 2-3 GM-% IV SOLR
2.0000 g | INTRAVENOUS | Status: AC
  Administered 2014-08-03: 2 g via INTRAVENOUS

## 2014-08-03 SURGICAL SUPPLY — 33 items
APL SKNCLS STERI-STRIP NONHPOA (GAUZE/BANDAGES/DRESSINGS)
BAG DECANTER FOR FLEXI CONT (MISCELLANEOUS) ×3 IMPLANT
BENZOIN TINCTURE PRP APPL 2/3 (GAUZE/BANDAGES/DRESSINGS) IMPLANT
BLADE HEX COATED 2.75 (ELECTRODE) ×3 IMPLANT
BLADE SURG 15 STRL LF DISP TIS (BLADE) ×1 IMPLANT
BLADE SURG 15 STRL SS (BLADE) ×3
CLOSURE WOUND 1/2 X4 (GAUZE/BANDAGES/DRESSINGS)
DECANTER SPIKE VIAL GLASS SM (MISCELLANEOUS) ×3 IMPLANT
DRAPE C-ARM 42X120 X-RAY (DRAPES) ×3 IMPLANT
DRAPE LAPAROTOMY TRNSV 102X78 (DRAPE) ×3 IMPLANT
ELECT REM PT RETURN 9FT ADLT (ELECTROSURGICAL) ×3
ELECTRODE REM PT RTRN 9FT ADLT (ELECTROSURGICAL) ×1 IMPLANT
GAUZE SPONGE 4X4 12PLY STRL (GAUZE/BANDAGES/DRESSINGS) IMPLANT
GAUZE SPONGE 4X4 16PLY XRAY LF (GAUZE/BANDAGES/DRESSINGS) ×3 IMPLANT
GLOVE BIOGEL PI IND STRL 7.0 (GLOVE) ×3 IMPLANT
GLOVE BIOGEL PI INDICATOR 7.0 (GLOVE) ×6
GLOVE SURG SS PI 6.5 STRL IVOR (GLOVE) ×6 IMPLANT
GOWN STRL REUS W/TWL XL LVL3 (GOWN DISPOSABLE) ×3 IMPLANT
KIT BASIN OR (CUSTOM PROCEDURE TRAY) ×3 IMPLANT
KIT PORT POWER 8FR ISP CVUE (Catheter) ×3 IMPLANT
LIQUID BAND (GAUZE/BANDAGES/DRESSINGS) ×3 IMPLANT
MARKER SKIN DUAL TIP RULER LAB (MISCELLANEOUS) ×3 IMPLANT
NEEDLE HYPO 22GX1.5 SAFETY (NEEDLE) IMPLANT
NEEDLE HYPO 25X1 1.5 SAFETY (NEEDLE) ×3 IMPLANT
NS IRRIG 1000ML POUR BTL (IV SOLUTION) ×3 IMPLANT
PACK BASIC VI WITH GOWN DISP (CUSTOM PROCEDURE TRAY) ×3 IMPLANT
PENCIL BUTTON HOLSTER BLD 10FT (ELECTRODE) ×3 IMPLANT
STRIP CLOSURE SKIN 1/2X4 (GAUZE/BANDAGES/DRESSINGS) IMPLANT
SUT MNCRL AB 4-0 PS2 18 (SUTURE) ×3 IMPLANT
SUT PROLENE 2 0 CT2 30 (SUTURE) ×3 IMPLANT
SYR CONTROL 10ML LL (SYRINGE) ×3 IMPLANT
SYRINGE 10CC LL (SYRINGE) ×6 IMPLANT
TOWEL OR 17X26 10 PK STRL BLUE (TOWEL DISPOSABLE) ×3 IMPLANT

## 2014-08-03 NOTE — Transfer of Care (Signed)
Immediate Anesthesia Transfer of Care Note  Patient: Darlene Kelly  Procedure(s) Performed: Procedure(s): INSERTION PORT-A-CATH (N/A)  Patient Location: PACU  Anesthesia Type:General  Level of Consciousness: awake and alert   Airway & Oxygen Therapy: Patient Spontanous Breathing and Patient connected to face mask oxygen  Post-op Assessment: Report given to RN, Post -op Vital signs reviewed and stable and Patient moving all extremities  Post vital signs: Reviewed and stable  Last Vitals:  Filed Vitals:   08/03/14 0749  BP: 169/80  Pulse: 77  Temp: 36.7 C  Resp: 18    Complications: No apparent anesthesia complications

## 2014-08-03 NOTE — H&P (Signed)
History of Present Illness Darlene Kitchen T. Daphne Karrer MD; 07/21/2014 11:16 AM) The patient is a 64 year old female who presents with breast cancer. She is a post menopausal female referred by Dr. Conchita Paris for evaluation of recently diagnosed carcinoma of the right breast. she had not had a breast screening in recent years. She recently was able to feel a mass in the right breast. She subsequently was referred to the breast center for evaluation. Subsequent imaging included diagnostic mamogram showing a 4 cm irregular spiculated mass in the upper outer right breast middle depth and ultrasound showing a 3.2 x 3.3 x 3.0 irregular hypoechlic mass at the 05:69 position 6 cm from the nipple. An ultrasound guided breast biopsy was performed on 07/13/2014 with pathology revealing invasive ductal carcinoma of the breast. subsequent bilateral breast MRI showed a large irregular enhancing mass in the upper outer right breast measuring 5 cm in maximum diameter and no other worrisome abnormalities in either breast or any evidence of adenopathy. She is seen now in breast multidisciplinary clinic for initial treatment planning. She has experienced a lump but no other symptoms such as pain, skin changes or nipple retraction or discharge. She does not have a personal history of any previous breast problems.  Findings at that time were the following: Tumor size: 5 cm Tumor grade: III Estrogen Receptor: Pos Progesterone Receptor: Neg Her-2 neu: Neg Lymph node status: Neg    Other Problems Jeanann Lewandowsky, RMA; 07/21/2014 7:48 AM) Arthritis Back Pain Breast Cancer Diabetes Mellitus Gastroesophageal Reflux Disease Heart murmur High blood pressure Hypercholesterolemia Kidney Stone Lump In Breast Melanoma Umbilical Hernia Repair  Past Surgical History Anderson Malta [place], RMA; 07/21/2014 7:48 AM) Breast Biopsy Right. Oral Surgery  Diagnostic Studies History Anderson Malta [place], [place]; 07/21/2014 7:48  AM) Colonoscopy never Mammogram within last year Pap Smear >5 years ago  Social History Anderson Malta [place], RMA; 07/21/2014 7:48 AM) Alcohol use Occasional alcohol use. Caffeine use Coffee, Tea. No drug use Tobacco use Former smoker.  Family History Anderson Malta [place], [place]; 07/21/2014 7:48 AM) Alcohol Abuse Family Members In General. Arthritis Mother. Colon Cancer Family Members In General. Heart Disease Brother, Father. Heart disease in female family member before age 46 Ischemic Bowel Disease Mother. Respiratory Condition Father, Mother.  Pregnancy / Birth History Anderson Malta [place], [place]; 07/21/2014 7:48 AM) Age at menarche 65 years. Age of menopause 51-55 Contraceptive History Oral contraceptives. Gravida 4 Irregular periods Maternal age 54-30 Para 3  Review of Systems Anderson Malta [place] RMA; 07/21/2014 7:48 AM) General Present- Appetite Loss. Not Present- Chills, Fatigue, Fever, Night Sweats, Weight Gain and Weight Loss. Skin Not Present- Change in Wart/Mole, Dryness, Hives, Jaundice, New Lesions, Non-Healing Wounds, Rash and Ulcer. HEENT Present- Hoarseness, Seasonal Allergies and Wears glasses/contact lenses. Not Present- Earache, Hearing Loss, Nose Bleed, Oral Ulcers, Ringing in the Ears, Sinus Pain, Sore Throat, Visual Disturbances and Yellow Eyes. Respiratory Present- Snoring. Not Present- Bloody sputum, Chronic Cough, Difficulty Breathing and Wheezing. Breast Present- Breast Mass and Breast Pain. Not Present- Nipple Discharge and Skin Changes. Cardiovascular Present- Palpitations. Not Present- Chest Pain, Difficulty Breathing Lying Down, Leg Cramps, Rapid Heart Rate, Shortness of Breath and Swelling of Extremities. Gastrointestinal Not Present- Abdominal Pain, Bloating, Bloody Stool, Change in Bowel Habits, Chronic diarrhea, Constipation, Difficulty Swallowing, Excessive gas, Gets full quickly at meals, Hemorrhoids, Indigestion, Nausea, Rectal Pain and  Vomiting. Female Genitourinary Not Present- Frequency, Nocturia, Painful Urination, Pelvic Pain and Urgency. Musculoskeletal Present- Back Pain and Joint Pain. Not Present- Joint Stiffness, Muscle Pain, Muscle Weakness and  Swelling of Extremities. Neurological Not Present- Decreased Memory, Fainting, Headaches, Numbness, Seizures, Tingling, Tremor, Trouble walking and Weakness. Psychiatric Present- Anxiety, Change in Sleep Pattern and Fearful. Not Present- Bipolar, Depression and Frequent crying. Endocrine Not Present- Cold Intolerance, Excessive Hunger, Hair Changes, Heat Intolerance, Hot flashes and New Diabetes. Hematology Present- Easy Bruising. Not Present- Excessive bleeding, Gland problems, HIV and Persistent Infections.   Physical Exam Darlene Kitchen T. Carole Doner MD; 07/21/2014 11:18 AM) The physical exam findings are as follows: Note:General: Alert, moderately overweight Caucasian female, in no distress Skin: Warm and dry without rash or infection. HEENT: No palpable masses or thyromegaly. Sclera nonicteric. Pupils equal round and reactive. Oropharynx clear. Lymph nodes: No cervical, supraclavicular, or inguinal nodes palpable. Breasts: there is a approximately 5 cm firm freely movable palpable mass in the lateral right breast. No skin changes or nipple discharge or inversion Lungs: Breath sounds clear and equal. No wheezing or increased work of breathing. Cardiovascular: Regular rate and rhythm, 2/6 systolic murmer. No JVD or edema. Abdomen: Nondistended. Soft and nontender. No masses palpable. No organomegaly. No palpable hernias. Extremities: No edema or joint swelling or deformity. No chronic venous stasis changes. Neurologic: Alert and fully oriented. Gait normal. No focal weakness. Psychiatric: Normal mood and affect. Thought content appropriate with normal judgement and insight    Assessment & Plan Darlene Kitchen T. Dakarai Mcglocklin MD; 07/21/2014 11:26 AM) MALIGNANT NEOPLASM OF UPPER-OUTER  QUADRANT OF RIGHT FEMALE BREAST (174.4  C50.411) Impression: 64 year old female with a new diagnosis of cancer of the right breast, upper outer quadrant. Clinical stage II, T2N0M0 ER Pos, PR Neg, HER-2 Neg. I discussed with the patient and family members present today initial surgical treatment options. We discussed options of breast conservation with lumpectomy or total mastectomy and sentinal lymph node biopsy/dissection. Options for reconstruction were discussed. she will definitely require systemic chemotherapy. She is currently borderline for breast conservation and therefore I believe would benefit from neoadjuvant treatment which would give [place] options for her surgical therapy.After discussion they have elected to proceed with neoadjuvant chemotherapy and likely breast conservation with lumpectomy and sentinel lymph node biopsy following treatment. We discussed Port-A-Cath placement including s well asations for the procedure and its nature as well as risks of anesthesia, bleeding, infection, pneumothorax,, catheter displacement or occlusion and small risk of DVT. All their questions were answered. Current Plans  Pt Education - [facility] Free Text Education/Instructions: discussed with patient and provided information. Schedule for Surgery Port-A-Cath placement under general anesthesia as an outpatient

## 2014-08-03 NOTE — Anesthesia Postprocedure Evaluation (Signed)
  Anesthesia Post-op Note  Patient: Darlene Kelly  Procedure(s) Performed: Procedure(s) (LRB): INSERTION PORT-A-CATH (N/A)  Patient Location: PACU  Anesthesia Type: General  Level of Consciousness: awake and alert   Airway and Oxygen Therapy: Patient Spontanous Breathing  Post-op Pain: mild  Post-op Assessment: Post-op Vital signs reviewed, Patient's Cardiovascular Status Stable, Respiratory Function Stable, Patent Airway and No signs of Nausea or vomiting  Last Vitals:  Filed Vitals:   08/03/14 1311  BP: 137/70  Pulse: 77  Temp: 36.6 C  Resp: 16    Post-op Vital Signs: stable   Complications: No apparent anesthesia complications

## 2014-08-03 NOTE — Interval H&P Note (Signed)
History and Physical Interval Note:  08/03/2014 10:42 AM  Darlene Kelly  has presented today for surgery, with the diagnosis of right breast cancer  The various methods of treatment have been discussed with the patient and family. After consideration of risks, benefits and other options for treatment, the patient has consented to  Procedure(s): INSERTION PORT-A-CATH (N/A) as a surgical intervention .  The patient's history has been reviewed, patient examined, no change in status, stable for surgery.  I have reviewed the patient's chart and labs.  Questions were answered to the patient's satisfaction.     Vuong Musa T

## 2014-08-03 NOTE — Anesthesia Procedure Notes (Signed)
Procedure Name: LMA Insertion Performed by: Chandra Batch A Pre-anesthesia Checklist: Patient identified, Emergency Drugs available, Suction available, Patient being monitored and Timeout performed Patient Re-evaluated:Patient Re-evaluated prior to inductionOxygen Delivery Method: Circle system utilized Preoxygenation: Pre-oxygenation with 100% oxygen Intubation Type: IV induction Ventilation: Mask ventilation without difficulty LMA: LMA inserted LMA Size: 3.0 Number of attempts: 1 Placement Confirmation: breath sounds checked- equal and bilateral and positive ETCO2 Dental Injury: Teeth and Oropharynx as per pre-operative assessment

## 2014-08-03 NOTE — Discharge Instructions (Signed)
PORT-A-CATH: POST OP INSTRUCTIONS ° °Always review your discharge instruction sheet given to you by the facility where your surgery was performed.  ° °1. A prescription for pain medication may be given to you upon discharge. Take your pain medication as prescribed, if needed. If narcotic pain medicine is not needed, then you make take acetaminophen (Tylenol) or ibuprofen (Advil) as needed.  °2. Take your usually prescribed medications unless otherwise directed. °3. If you need a refill on your pain medication, please contact our office. All narcotic pain medicine now requires a paper prescription.  Phoned in and fax refills are no longer allowed by law.  Prescriptions will not be filled after 5 pm or on weekends.  °4. You should follow a light diet for the remainder of the day after your procedure. °5. Most patients will experience some mild swelling and/or bruising in the area of the incision. It may take several days to resolve. °6. It is common to experience some constipation if taking pain medication after surgery. Increasing fluid intake and taking a stool softener (such as Colace) will usually help or prevent this problem from occurring. A mild laxative (Milk of Magnesia or Miralax) should be taken according to package directions if there are no bowel movements after 48 hours.  °7. Unless discharge instructions indicate otherwise, you may remove your bandages 48 hours after surgery, and you may shower at that time. You may have steri-strips (small white skin tapes) in place directly over the incision.  These strips should be left on the skin for 7-10 days.  If your surgeon used Dermabond (skin glue) on the incision, you may shower in 24 hours.  The glue will flake off over the next 2-3 weeks.  °8. If your port is left accessed at the end of surgery (needle left in port), the dressing cannot get wet and should only by changed by a healthcare professional. When the port is no longer accessed (when the needle has  been removed), follow step 7.   °9. ACTIVITIES:  Limit activity involving your arms for the next 72 hours. Do no strenuous exercise or activity for 1 week. You may drive when you are no longer taking prescription pain medication, you can comfortably wear a seatbelt, and you can maneuver your car. °10.You may need to see your doctor in the office for a follow-up appointment.  Please °      check with your doctor.  °11.When you receive a new Port-a-Cath, you will get a product guide and  °      ID card.  Please keep them in case you need them. ° °WHEN TO CALL YOUR DOCTOR ([phone]): °1. Fever over 101.0 °2. Chills °3. Continued bleeding from incision °4. Increased redness and tenderness at the site °5. Shortness of breath, difficulty breathing ° ° °The clinic staff is available to answer your questions during regular business hours. Please don’t hesitate to call and ask to speak to one of the nurses or medical assistants for clinical concerns. If you have a medical emergency, go to the nearest emergency room or call 911.  A surgeon from [facility] is always on call at the hospital.  ° ° ° °For further information, please visit [email or web address] ° ° ° ° °General Anesthesia, Care After °Refer to this sheet in the next few weeks. These instructions provide you with information on caring for yourself after your procedure. Your health care provider may also give you more specific instructions. Your treatment has been   planned according to current medical practices, but problems sometimes occur. Call your health care provider if you have any problems or questions after your procedure. °WHAT TO EXPECT AFTER THE PROCEDURE °After the procedure, it is typical to experience: °· Sleepiness. °· Nausea and vomiting. °HOME CARE INSTRUCTIONS °· For the first 24 hours after general anesthesia: °¨ Have a responsible person with you. °¨ Do not drive a car. If you are alone, do not take public  transportation. °¨ Do not drink alcohol. °¨ Do not take medicine that has not been prescribed by your health care provider. °¨ Do not sign important papers or make important decisions. °¨ You may resume a normal diet and activities as directed by your health care provider. °· Change bandages (dressings) as directed. °· If you have questions or problems that seem related to general anesthesia, call the hospital and ask for the anesthetist or anesthesiologist on call. °SEEK MEDICAL CARE IF: °· You have nausea and vomiting that continue the day after anesthesia. °· You develop a rash. °SEEK IMMEDIATE MEDICAL CARE IF:  °· You have difficulty breathing. °· You have chest pain. °· You have any allergic problems. °Document Released: 06/25/2000 Document Revised: 03/24/2013 Document Reviewed: 10/02/2012 °Exit[employer]® Patient Information ©2015 [employer]. This information is not intended to replace advice given to you by your health care provider. Make sure you discuss any questions you have with your health care provider. ° °

## 2014-08-03 NOTE — Anesthesia Preprocedure Evaluation (Addendum)
Anesthesia Evaluation  Patient identified by MRN, date of birth, ID band Patient awake    Reviewed: Allergy & Precautions, NPO status , Patient's Chart, lab work & pertinent test results  Airway Mallampati: I  TM Distance: >3 FB Neck ROM: Full    Dental   Pulmonary former smoker,    Pulmonary exam normal       Cardiovascular hypertension, Pt. on medications + Valvular Problems/Murmurs AS and MR  Pt had ECHO 08/2013 - Rheumatic Fever as a child Mild AS and MR Good LV function   Neuro/Psych    GI/Hepatic   Endo/Other  diabetes, Type 2, Oral Hypoglycemic Agents  Renal/GU      Musculoskeletal   Abdominal   Peds  Hematology   Anesthesia Other Findings   Reproductive/Obstetrics                          Anesthesia Physical Anesthesia Plan  ASA: II  Anesthesia Plan: General   Post-op Pain Management:    Induction: Intravenous  Airway Management Planned: LMA  Additional Equipment:   Intra-op Plan:   Post-operative Plan: Extubation in OR  Informed Consent: I have reviewed the patients History and Physical, chart, labs and discussed the procedure including the risks, benefits and alternatives for the proposed anesthesia with the patient or authorized representative who has indicated his/her understanding and acceptance.     Plan Discussed with: CRNA and Surgeon  Anesthesia Plan Comments:         Anesthesia Quick Evaluation

## 2014-08-03 NOTE — Op Note (Signed)
Preoperative diagnosis: Cancer of the breast and the poor venous access  Postoperative diagnosis: Same  Procedure: Placement of ClearVue subcutaneous venous port  Surgeon: [name] M.D.  Anesthesia: LMA General  Description of procedure: Patient is brought to the operating room and placed in the supine position on the operating table. IV sedation was administered. The entire upper chest and neck were widely sterilely prepped and draped. Local anesthesia was used to infiltrate the insertion of port site. The left internal jugular vein vein was cannulated with a needle and guidewire without difficulty after initial attempt at the left subclavian vein. Good blood return but I could not thread the guidewire, and position in the superior vena cava was confirmed by fluoroscopy. The introducer was then placed over the guidewire and the flushed catheter placed via the introducer which was stripped away and the tip of the catheter positioned near the cavoatrial junction. A small transverse incision was made in the anterior chest wall and subcutaneous pocket created. The catheter was tunneled into the pocket, trimmed to length, and attached to the flushed port which was positioned in the pocket. The port was sutured to the chest wall with interrupted 2-0 Prolene. The incisions were closed with subcutaneous interrupted Monocryl and the skin incisions closed with subcuticular Monocryl and Dermabond. The port was accessed and flushed and aspirated easily and was left flushed with concentrated heparin solution. Sponge needle as the counts were correct. The patient was taken to recovery in good condition.  Darlene Kelly  08/03/2014

## 2014-08-04 ENCOUNTER — Encounter (HOSPITAL_COMMUNITY): Payer: Self-pay | Admitting: General Surgery

## 2014-08-04 NOTE — Anesthesia Postprocedure Evaluation (Signed)
Anesthesia Post Note  Patient: Darlene Kelly  Procedure(s) Performed: Procedure(s) (LRB): INSERTION PORT-A-CATH (N/A)  Anesthesia type: general  Patient location: PACU  Post pain: Pain level controlled  Post assessment: Patient's Cardiovascular Status Stable  Last Vitals:  Filed Vitals:   08/03/14 1415  BP: 140/72  Pulse: 79  Temp: 36.6 C  Resp: 18    Post vital signs: Reviewed and stable  Level of consciousness: sedated  Complications: No apparent anesthesia complications

## 2014-08-06 ENCOUNTER — Telehealth: Payer: Self-pay | Admitting: *Deleted

## 2014-08-06 ENCOUNTER — Other Ambulatory Visit: Payer: Self-pay | Admitting: *Deleted

## 2014-08-06 MED ORDER — LIDOCAINE-PRILOCAINE 2.5-2.5 % EX KIT: PACK | CUTANEOUS | Status: DC | PRN

## 2014-08-06 NOTE — Telephone Encounter (Signed)
TC from patient requesting prescription for EMLA cream. She has had her port inserted and needs the cream prior to chemo on 08/16/14

## 2014-08-06 NOTE — Telephone Encounter (Signed)
Order sent to pharmacy  

## 2014-08-11 ENCOUNTER — Encounter: Payer: Self-pay | Admitting: Nurse Practitioner

## 2014-08-11 ENCOUNTER — Telehealth: Payer: Self-pay | Admitting: Nurse Practitioner

## 2014-08-11 ENCOUNTER — Ambulatory Visit (HOSPITAL_BASED_OUTPATIENT_CLINIC_OR_DEPARTMENT_OTHER): Payer: 59 | Admitting: Nurse Practitioner

## 2014-08-11 VITALS — BP 163/63 | HR 66 | Temp 98.8°F | Resp 18 | Ht 63.0 in | Wt 172.9 lb

## 2014-08-11 DIAGNOSIS — C50411 Malignant neoplasm of upper-outer quadrant of right female breast: Secondary | ICD-10-CM | POA: Diagnosis not present

## 2014-08-11 DIAGNOSIS — Z170 Estrogen receptor positive status [ER+]: Secondary | ICD-10-CM | POA: Diagnosis not present

## 2014-08-11 MED ORDER — DEXAMETHASONE 4 MG PO TABS: ORAL_TABLET | ORAL | Status: DC

## 2014-08-11 MED ORDER — LORAZEPAM 0.5 MG PO TABS: 0.5000 mg | ORAL_TABLET | Freq: Every day | ORAL | Status: DC

## 2014-08-11 MED ORDER — ONDANSETRON HCL 8 MG PO TABS: 8.0000 mg | ORAL_TABLET | Freq: Two times a day (BID) | ORAL | Status: DC | PRN

## 2014-08-11 MED ORDER — PROCHLORPERAZINE MALEATE 10 MG PO TABS: 10.0000 mg | ORAL_TABLET | Freq: Four times a day (QID) | ORAL | Status: DC | PRN

## 2014-08-11 NOTE — Progress Notes (Signed)
[facility]  [phone] [phone]) [phone]     ID: Darlene Kelly DOB: 1950/08/21  MR#: [id number]  [facility]#:[id number]  Patient Care Team: [name], MD as PCP - General (Family Medicine) [name], MD as Consulting Physician (General Surgery) [name], MD as Consulting Physician (Oncology) [name], MD as Consulting Physician (Radiation Oncology) [name], RN as Registered Nurse [name], RN as Registered Nurse [name], MD as Referring Physician (Cardiology) [name], MD as Referring Physician (Dermatology) [name], NP as Nurse Practitioner (Nurse Practitioner) PCP: [name], MD OTHER MD:  CHIEF COMPLAINT: Estrogen receptor positive breast cancer  CURRENT TREATMENT: Neoadjuvant chemotherapy   BREAST CANCER HISTORY: Darlene Kelly first noted a change in her right breast September 2015. At that time however she was consumed by the care of her mother-in-law, who had severe Alzheimer's disease. She finally passed in January 2016, but Darlene Kelly did not share the information regarding the right breast with her husband until late March 2016. At that point she was set up for bilateral diagnostic mammography with tomography at the breast Center, for 11/20/2014. This found the breast density to be category B. In the upper outer quadrant of the right breast there was a 4 cm irregular spiculated mass. There were no other findings of concern. The mass was palpable and firm, and by ultrasound measured 3.3 cm. The right axilla was negative sonographically.  Biopsy of the right breast mass in question was obtained for 03/22/2015. 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  On 07/20/2014 the patient underwent bilateral breast MRI. This measured the large  irregular enhancing mass in the right breast at the 10:00 position at 5.0 cm. There were no additional worrisome findings in the right breast, and no findings in the left breast or any abnormal appearing lymph nodes.  The patient's subsequent history is as detailed below  INTERVAL HISTORY: Darlene Kelly returns today for follow up of her breast cancer. She had a port placed last week and this is healing nicely. She never had to fill her norco as advil was enough for the pain. She is due to start her neoadjuvant chemotherapy regimen, doxorubicin and cyclophosphamide in dose dense fashion 4, with Neulasta support on day 2.   REVIEW OF SYSTEMS: Darlene Kelly has no complaints to offer today. She walks multiple days weekly for exercise, sometimes twice a day. She has no pain beyond the port site. She denies shortness of breath, chest pain, cough, or palpitations. She sleeps well. She has no headaches, dizziness, vision changes, or weakness. A detailed review of systems is otherwise stable.  PAST MEDICAL HISTORY: Past Medical History  Diagnosis Date  . Breast cancer of upper-outer quadrant of right female breast 07/16/2014  . Diabetes mellitus without complication   . Hypertension   . Skin cancer   . Heart murmur     PAST SURGICAL HISTORY: Past Surgical History  Procedure Laterality Date  . Portacath placement N/A 08/03/2014    Procedure: INSERTION PORT-A-CATH;  Surgeon: [name], MD;  Location: [facility];  Service: General;  Laterality: N/A;    FAMILY HISTORY Family History  Problem Relation Age of Onset  . Lung cancer Father    the patient's father died from lung cancer in the setting of tobacco abuse at the age of 60. The patient's mother died from COPD at  the age of 56. The patient has 3 brothers, no sisters. There is no history of breast or ovarian cancer in the family.  GYNECOLOGIC HISTORY:  No LMP recorded. Patient is postmenopausal. Menarche age 54, first live birth age 72. She is GX P3.  She went through the change of life in her late 13s. She did not take hormone replacement.  SOCIAL HISTORY:  Darlene Kelly worked for [employer], but is now retired. Her husband [name] is Programmer, systems for a [employer]. They have been married more than 40 years. Son [name] "[name] was "'s Wing Gfeller is a climbing Acupuncturist in [place]. Son [name] "map" Ariela Mochizuki is a Dealer in [place]. Daughter Akira Perusse is a branch bank or for [employer] in [place]. The patient has 3 grandchildren.    ADVANCED DIRECTIVES: In place   HEALTH MAINTENANCE: History  Substance Use Topics  . Smoking status: Former Smoker -- 0.50 packs/day for 12 years    Types: Cigarettes    Quit date: 04/02/1986  . Smokeless tobacco: Never Used  . Alcohol Use: 1.8 oz/week    3 Glasses of wine per week     Colonoscopy: Never  PAP:  Bone density: Never  Lipid panel:  Allergies  Allergen Reactions  . Latex Itching, Dermatitis and Rash  . Lipitor [Atorvastatin] Other (See Comments)    Bad leg cramps    Current Outpatient Prescriptions  Medication Sig Dispense Refill  . aspirin EC 81 MG tablet Take 81 mg by mouth at bedtime.     Marland Kitchen ibuprofen (ADVIL,MOTRIN) 200 MG tablet Take 400 mg by mouth every 6 (six) hours as needed for headache or moderate pain.    Marland Kitchen lisinopril (PRINIVIL,ZESTRIL) 10 MG tablet Take 10 mg by mouth at bedtime.   6  . MELATONIN PO Take 5 mg by mouth at bedtime.     . metFORMIN (GLUCOPHAGE) 1000 MG tablet Take 1,000 mg by mouth 2 (two) times daily with a meal.    . metoprolol succinate (TOPROL-XL) 100 MG 24 hr tablet Take 100 mg by mouth every evening.     . Omega-3 Fatty Acids (FISH OIL PO) Take 1 capsule by mouth every evening.     . rosuvastatin (CRESTOR) 40 MG tablet Take by mouth daily. Pt takes 1/2 tablet(36m) on Mondays, Wednesdays, Fridays.    . cetirizine (ZYRTEC) 10 MG tablet Take 10 mg by mouth daily as needed for allergies.    .Marland Kitchendexamethasone  (DECADRON) 4 MG tablet Take 2 tablets by mouth once a day on the day after chemotherapy and then take 2 tablets two times a day for 2 days. Take with food. 30 tablet 1  . HYDROcodone-acetaminophen (NORCO/VICODIN) 5-325 MG per tablet Take 1-2 tablets by mouth every 4 (four) hours as needed for moderate pain or severe pain. (Patient not taking: Reported on 08/11/2014) 30 tablet 0  . lidocaine-prilocaine (EMLA) cream Apply topically as needed. Use as directed on port site 1 hour prior to IV access (Patient not taking: Reported on 08/11/2014) 1 each 3  . LORazepam (ATIVAN) 0.5 MG tablet Take 1 tablet (0.5 mg total) by mouth at bedtime. 30 tablet 0  . ondansetron (ZOFRAN) 8 MG tablet Take 1 tablet (8 mg total) by mouth 2 (two) times daily as needed. Start on the third day after chemotherapy. 30 tablet 1  . prochlorperazine (COMPAZINE) 10 MG tablet Take 1 tablet (10 mg total) by mouth every 6 (six) hours as needed (Nausea or vomiting).  30 tablet 1   No current facility-administered medications for this visit.    OBJECTIVE: Middle-aged white woman without appears stated age 84 Vitals:   08/11/14 1319  BP: 163/63  Pulse: 66  Temp: 98.8 F (37.1 C)  Resp: 18     Body mass index is 30.64 kg/(m^2).    ECOG FS:0 - Asymptomatic  Skin: warm, dry  HEENT: sclerae anicteric, conjunctivae pink, oropharynx clear. No thrush or mucositis.  Lymph Nodes: No cervical or supraclavicular lymphadenopathy  Lungs: clear to auscultation bilaterally, no rales, wheezes, or rhonci  Heart: regular rate and rhythm, systolic murmur  Abdomen: round, soft, non tender, positive bowel sounds  Musculoskeletal: No focal spinal tenderness, no peripheral edema  Neuro: non focal, well oriented, positive affect  Breasts: deferred. New port to left chest wall clean dry and intact  LAB RESULTS:  CMP     Component Value Date/Time   NA 143 07/21/2014 0818   K 4.7 07/21/2014 0818   CO2 23 07/21/2014 0818   GLUCOSE 163*  07/21/2014 0818   BUN 15.3 07/21/2014 0818   CREATININE 0.8 07/21/2014 0818   CALCIUM 10.0 07/21/2014 0818   PROT 7.4 07/21/2014 0818   ALBUMIN 4.1 07/21/2014 0818   AST 15 07/21/2014 0818   ALT 14 07/21/2014 0818   ALKPHOS 66 07/21/2014 0818   BILITOT 0.38 07/21/2014 0818    INo results found for: SPEP, UPEP  Lab Results  Component Value Date   WBC 6.1 07/21/2014   NEUTROABS 4.4 07/21/2014   HGB 13.3 07/21/2014   HCT 40.7 07/21/2014   MCV 82.2 07/21/2014   PLT 224 07/21/2014      Chemistry      Component Value Date/Time   NA 143 07/21/2014 0818   K 4.7 07/21/2014 0818   CO2 23 07/21/2014 0818   BUN 15.3 07/21/2014 0818   CREATININE 0.8 07/21/2014 0818      Component Value Date/Time   CALCIUM 10.0 07/21/2014 0818   ALKPHOS 66 07/21/2014 0818   AST 15 07/21/2014 0818   ALT 14 07/21/2014 0818   BILITOT 0.38 07/21/2014 0818       No results found for: LABCA2  No components found for: LABCA125  No results for input(s): INR in the last 168 hours.  Urinalysis No results found for: COLORURINE, APPEARANCEUR, LABSPEC, PHURINE, GLUCOSEU, HGBUR, BILIRUBINUR, KETONESUR, PROTEINUR, UROBILINOGEN, NITRITE, LEUKOCYTESUR  STUDIES: Dg Chest 2 View  08/03/2014   CLINICAL DATA:  64 year old female status post porta cath placement. Breast cancer. Abnormal appearing left hilum. Initial encounter.  EXAM: CHEST  2 VIEW  COMPARISON:  Portable chest 1230 hours today.  FINDINGS: Left chest IJ approach porta cath re - identified. The entire catheter course now is visible. The tip appears stable at the innominate vein confluence.  Low lung volumes. Persistent left perihilar and lower lobe Patchy and confluent pulmonary opacity. No pleural effusion identified. No pneumothorax or pulmonary edema.  Other mediastinal contours are within normal limits. Visualized tracheal air column is within normal limits. No acute osseous abnormality identified. Calcified atherosclerosis of the aorta.   IMPRESSION: 1. Stable left chest porta cath. The entire catheter course is included on these images. 2. Abnormal but nonspecific left perihilar and lower lobe pulmonary opacity. Consider aspiration, pneumonia, less likely atelectasis. No pleural effusion. 3. Chest CT (IV contrast preferred) would evaluate further).   Electronically Signed   By: Genevie Ann M.D.   On: 08/03/2014 13:34   Mr Breast Bilateral W Wo Contrast  07/20/2014  CLINICAL DATA:  Recently diagnosed grade 3 right breast invasive ductal carcinoma. Preop evaluation.  LABS:  BUN and creatinine were obtained on site at [facility] at  [address]  Results:  BUN 16 mg/dL,  Creatinine 0.6 mg/dL.  EXAM: BILATERAL BREAST MRI WITH AND WITHOUT CONTRAST  TECHNIQUE: Multiplanar, multisequence MR images of both breasts were obtained prior to and following the intravenous administration of 15 ml of MultiHance  THREE-DIMENSIONAL MR IMAGE RENDERING ON INDEPENDENT WORKSTATION:  Three-dimensional MR images were rendered by post-processing of the original MR data on an independent workstation. The three-dimensional MR images were interpreted, and findings are reported in the following complete MRI report for this study. Three dimensional images were evaluated at the independent DynaCad workstation  COMPARISON:  Mammogram and ultrasound dated 07/09/2014.  FINDINGS: Breast composition: b.  Scattered fibroglandular tissue.  Background parenchymal enhancement: Minimal  Right breast: There is a large irregular enhancing mass located within the middle 1/3 of the right breast at the 10 o'clock position which measures 5.0 x 4.3 x 3.5 cm in size and corresponds to the recently diagnosed invasive ductal carcinoma. There is signal void associated with the clip within the anterior portion of the mass. There are no additional worrisome enhancing foci within the right breast.  Left breast: No mass or abnormal enhancement.  Lymph nodes: No abnormal appearing lymph  nodes.  Ancillary findings:  None.  IMPRESSION: 5.0 cm irregular enhancing mass located within the right breast at 10 o'clock position corresponding to the recently diagnosed invasive ductal carcinoma. No additional findings.  RECOMMENDATION: Treatment plan.  BI-RADS CATEGORY  6: Known biopsy-proven malignancy.   Electronically Signed   By: Altamese Cabal M.D.   On: 07/20/2014 11:04   Dg Chest Port 1 View  08/03/2014   CLINICAL DATA:  64 year old female status post left porta cath placement. Breast cancer. Initial encounter.  EXAM: PORTABLE CHEST - 1 VIEW  COMPARISON:  None.  FINDINGS: Portable AP semi upright view at 1230 hours. Left chest IJ approach porta cath in place. The most cephalad aspect of the catheter is not included on this image. The catheter tip projects above the carina at the level of the innominate vein confluence. No pneumothorax.  There is an asymmetric appearance of the left hilum with mildly increased density and perihilar and retrocardiac opacity.  Other mediastinal contours are within normal limits. No pulmonary edema, pleural effusion or other confluent pulmonary opacity. Visualized tracheal air column is within normal limits.  IMPRESSION: 1. Left IJ approach porta cath placed. Tip projects at the innominate vein confluence level. The most cephalad aspect of the catheter is not included on this image. 2. Asymmetric appearance of the left hilum with perihilar/retrocardiac opacity. This is nonspecific and might simply reflect atelectasis. Recommend upright PA and lateral chest radiographs today if possible to evaluate further. PACU RN Bridget Hartshorn advised of the above by telephone on 08/03/2014 at 12:47 .   Electronically Signed   By: Genevie Ann M.D.   On: 08/03/2014 12:47   Mm Digital Diagnostic Unilat R  07/13/2014   CLINICAL DATA:  Post ultrasound-guided right breast biopsy  EXAM: DIAGNOSTIC right MAMMOGRAM POST ULTRASOUND BIOPSY  COMPARISON:  Previous exam(s).  FINDINGS: Mammographic  images were obtained following ultrasound guided biopsy of right breast mass 10 o'clock location. The ribbon shaped clip is appropriately located at the biopsy portion of the mass anteriorly.  IMPRESSION: Appropriate ribbon shaped clip location, right breast mass 10 o'clock location biopsy site.  Final Assessment: Post Procedure Mammograms for Marker Placement   Electronically Signed   By: Conchita Paris M.D.   On: 07/13/2014 10:45   Mm Radiologist Eval And Mgmt  07/14/2014   EXAM: ESTABLISHED PATIENT OFFICE VISIT - LEVEL II  CHIEF COMPLAINT: The patient presented initially for evaluation of a palpable right breast mass in the 10 o'clock location. Imaging revealed a 3 cm irregular spiculated suspicious mass in that location and biopsy was performed 07/13/2014.  Current Pain Level: 0  HISTORY OF PRESENT ILLNESS: The patient presents today for discussion of pathology results after biopsy.  EXAM: At physical exam, there is mild erythema at the right breast 10 o'clock location biopsy site. The bandages are clean and dry and no ecchymosis or hematoma formation is identified.  PATHOLOGY: Pathology demonstrates invasive ductal carcinoma, grade 3, which is concordant with the imaging appearance.  ASSESSMENT AND PLAN: ASSESSMENT AND PLAN Multi disciplinary [place] referral has been arranged 07/21/2014. The patient will be contacted regarding breast MRI scheduling, and an appointment will be made by telephone. All questions were answered. She was provided with educational materials. She was asked to contact us if the erythema increases or if there is any other clinical problem in the meantime. Infection would be unlikely so soon after biopsy, and contact dermatitis reaction to the chlorhexidine preparation or Band-Aids is possible. Conservative treatment is recommended unless there is any worsening.   Electronically Signed   By: Conchita Paris M.D.   On: 07/14/2014 11:06   Dg C-arm 1-60 Min-no Report  08/03/2014    CLINICAL DATA: port-a-cath placement   C-ARM 1-60 MINUTES  Fluoroscopy was utilized by the requesting physician.  No radiographic  interpretation.    [place] Rt Breast Bx W Loc Dev 1st Lesion Img Bx Spec US Guide  07/13/2014   CLINICAL DATA:  Suspicious right breast mass 10 o'clock location  EXAM: ULTRASOUND GUIDED right BREAST CORE NEEDLE BIOPSY  COMPARISON:  Previous exam(s).  PROCEDURE: I met with the patient and we discussed the procedure of ultrasound-guided biopsy, including benefits and alternatives. We discussed the high likelihood of a successful procedure. We discussed the risks of the procedure including infection, bleeding, tissue injury, clip migration, and inadequate sampling. Informed written consent was given. The usual time-out protocol was performed immediately prior to the procedure.  Using sterile technique and 2% Lidocaine as local anesthetic, under direct ultrasound visualization, a 12 gauge vacuum-assisted device was used to perform biopsy of right breast mass 10 o'clock locationusing a lateral to medial approach. At the conclusion of the procedure, a ribbon shaped tissue marker clip was deployed into the biopsy cavity. Follow-up 2-view mammogram was performed and dictated separately.  IMPRESSION: Ultrasound-guided biopsy of right breast mass 10 o'clock location with ribbon shaped clip placement. Pathology is pending. No apparent complications.   Electronically Signed   By: Conchita Paris M.D.   On: 07/13/2014 11:28    ASSESSMENT: 64 y.o.  woman status post right breast upper outer quadrant biopsy 07/13/2014, for a clinical T3 N0, stage IIB invasive ductal carcinoma, grade 3, estrogen receptor positive, HER-2 and progesterone receptor negative, with an MIB-1 of 40%  (1) neoadjuvant chemotherapy to start 08/15/2013, consisting of doxorubicin and cyclophosphamide in dose dense fashion 4, with Neulasta support, followed by weekly paclitaxel 12  (2) breast conserving surgery  with sentinel lymph node sampling to follow chemotherapy  (3) adjuvant radiation to follow surgery  (4) anti-estrogens to follow radiation  PLAN: Bria and I spent about 40  minutes discussing her upcoming chemotherapy plans. She was made aware of potential side effects and toxicities of both cyclophosphamide and doxorubicin. She attended chemotherapy school 2 weeks ago,so a lot of this information was a review for her. She was made aware of her neulasta injections. She prefers to return the next day for her injections instead of using the onbody device.  Finally, we reviewed her antiemetic schedule, and she feels comfortable with every medication listed as well as the indication and potential side effects. These prescriptions were sent to her pharmacy today.  Juliett will return next Monday for her first cycle of treatment. She understands and agrees with this plan. She knows the goal of treatment in her case is cure. She has been encouraged to call with any issues that might arise before her next visit here.  Laurie Panda, NP   08/11/2014 2:46 PM

## 2014-08-11 NOTE — Telephone Encounter (Signed)
Appointments made per pof and she will get a new schedule/avs on 5/16 at chemo

## 2014-08-12 ENCOUNTER — Telehealth: Payer: Self-pay | Admitting: *Deleted

## 2014-08-12 NOTE — Telephone Encounter (Signed)
This RN contacted [facility] to follow up on ECHO pt was to obtain.  ECHO was performed on 08/04/2014 " but not read yet and usually that take 7-10 business days ".  Per inquiry and need to start cardiotoxic chemotherapy on 5/17 request being sent to Dr Woody Seller for urgent reading.  Contact number for above is [phone].

## 2014-08-13 ENCOUNTER — Telehealth: Payer: Self-pay | Admitting: *Deleted

## 2014-08-13 NOTE — Telephone Encounter (Signed)
ECHO results received from [facility].  Test performed on 08/04/2014.  Reading states :  Left ventrickle cavity is normal in size. Mild concentric hypertrophy of the left ventricle. Normal global wall motion. Calculated EF 60%.  Read by Dr Adrian Prows

## 2014-08-16 ENCOUNTER — Other Ambulatory Visit: Payer: Self-pay | Admitting: *Deleted

## 2014-08-16 ENCOUNTER — Other Ambulatory Visit: Payer: Self-pay | Admitting: Oncology

## 2014-08-16 ENCOUNTER — Ambulatory Visit (HOSPITAL_BASED_OUTPATIENT_CLINIC_OR_DEPARTMENT_OTHER): Payer: 59

## 2014-08-16 VITALS — BP 129/54 | HR 69 | Temp 98.0°F | Resp 18

## 2014-08-16 DIAGNOSIS — C50411 Malignant neoplasm of upper-outer quadrant of right female breast: Secondary | ICD-10-CM

## 2014-08-16 DIAGNOSIS — Z5111 Encounter for antineoplastic chemotherapy: Secondary | ICD-10-CM | POA: Diagnosis not present

## 2014-08-16 LAB — BASIC METABOLIC PANEL (CC13)
ANION GAP: 12 meq/L — AB (ref 3–11)
BUN: 16.4 mg/dL (ref 7.0–26.0)
CO2: 22 meq/L (ref 22–29)
Calcium: 9.3 mg/dL (ref 8.4–10.4)
Chloride: 108 mEq/L (ref 98–109)
Creatinine: 0.7 mg/dL (ref 0.6–1.1)
EGFR: >90 mL/min/{1.73_m2} (ref >90)
GLUCOSE: 132 mg/dL (ref 70–140)
POTASSIUM: 3.8 meq/L (ref 3.5–5.1)
Sodium: 142 mEq/L (ref 136–145)

## 2014-08-16 MED ORDER — PALONOSETRON HCL INJECTION 0.25 MG/5ML
0.2500 mg | Freq: Once | INTRAVENOUS | Status: AC
  Administered 2014-08-16: 0.25 mg via INTRAVENOUS

## 2014-08-16 MED ORDER — SODIUM CHLORIDE 0.9 % IV SOLN
Freq: Once | INTRAVENOUS | Status: AC
  Administered 2014-08-16: 09:00:00 via INTRAVENOUS

## 2014-08-16 MED ORDER — SODIUM CHLORIDE 0.9 % IJ SOLN
10.0000 mL | INTRAMUSCULAR | Status: DC | PRN
  Administered 2014-08-16: 10 mL
  Filled 2014-08-16: qty 10

## 2014-08-16 MED ORDER — HEPARIN SOD (PORK) LOCK FLUSH 100 UNIT/ML IV SOLN
500.0000 [IU] | Freq: Once | INTRAVENOUS | Status: AC | PRN
  Administered 2014-08-16: 500 [IU]
  Filled 2014-08-16: qty 5

## 2014-08-16 MED ORDER — SODIUM CHLORIDE 0.9 % IV SOLN
Freq: Once | INTRAVENOUS | Status: AC
  Administered 2014-08-16: 10:00:00 via INTRAVENOUS
  Filled 2014-08-16: qty 5

## 2014-08-16 MED ORDER — DOXORUBICIN HCL CHEMO IV INJECTION 2 MG/ML
60.0000 mg/m2 | Freq: Once | INTRAVENOUS | Status: AC
  Administered 2014-08-16: 112 mg via INTRAVENOUS
  Filled 2014-08-16: qty 56

## 2014-08-16 MED ORDER — PALONOSETRON HCL INJECTION 0.25 MG/5ML
INTRAVENOUS | Status: AC
  Filled 2014-08-16: qty 5

## 2014-08-16 MED ORDER — CYCLOPHOSPHAMIDE CHEMO INJECTION 1 GM
600.0000 mg/m2 | Freq: Once | INTRAMUSCULAR | Status: AC
  Administered 2014-08-16: 1120 mg via INTRAVENOUS
  Filled 2014-08-16: qty 56

## 2014-08-16 NOTE — Patient Instructions (Addendum)
[facility] Discharge Instructions for Patients Receiving Chemotherapy  Today you received the following chemotherapy agents Adriamycin and Cytoxan.  To help prevent nausea and vomiting after your treatment, we encourage you to take your nausea medication as follows take Compazine(prochlorperazine) every 6 hours as needed. You may start taking Zofran(ondanstron) on Thursday 08/19/14.   If you develop nausea and vomiting that is not controlled by your nausea medication, call the clinic.   BELOW ARE SYMPTOMS THAT SHOULD BE REPORTED IMMEDIATELY:  *FEVER GREATER THAN 100.5 F  *CHILLS WITH OR WITHOUT FEVER  NAUSEA AND VOMITING THAT IS NOT CONTROLLED WITH YOUR NAUSEA MEDICATION  *UNUSUAL SHORTNESS OF BREATH  *UNUSUAL BRUISING OR BLEEDING  TENDERNESS IN MOUTH AND THROAT WITH OR WITHOUT PRESENCE OF ULCERS  *URINARY PROBLEMS  *BOWEL PROBLEMS  UNUSUAL RASH Items with * indicate a potential emergency and should be followed up as soon as possible.  Feel free to call the clinic you have any questions or concerns. The clinic phone number is [phone].  Please show the [facility] at check-in to the Emergency Department and triage nurse.

## 2014-08-16 NOTE — Progress Notes (Signed)
Per MD, proceed with treatment without lab results. BMET drawn today. Ejection fraction 60% per Dr Adrian Prows on 08/04/14.

## 2014-08-17 ENCOUNTER — Ambulatory Visit (HOSPITAL_BASED_OUTPATIENT_CLINIC_OR_DEPARTMENT_OTHER): Payer: 59

## 2014-08-17 VITALS — BP 133/70 | HR 64 | Temp 98.1°F

## 2014-08-17 DIAGNOSIS — C50411 Malignant neoplasm of upper-outer quadrant of right female breast: Secondary | ICD-10-CM | POA: Diagnosis not present

## 2014-08-17 DIAGNOSIS — Z5189 Encounter for other specified aftercare: Secondary | ICD-10-CM

## 2014-08-17 MED ORDER — PEGFILGRASTIM INJECTION 6 MG/0.6ML ~~LOC~~
6.0000 mg | PREFILLED_SYRINGE | Freq: Once | SUBCUTANEOUS | Status: AC
  Administered 2014-08-17: 6 mg via SUBCUTANEOUS
  Filled 2014-08-17: qty 0.6

## 2014-08-17 NOTE — Patient Instructions (Signed)
Pegfilgrastim injection What is this medicine? PEGFILGRASTIM (peg fil GRA stim) is a long-acting granulocyte colony-stimulating factor that stimulates the growth of neutrophils, a type of white blood cell important in the body's fight against infection. It is used to reduce the incidence of fever and infection in patients with certain types of cancer who are receiving chemotherapy that affects the bone marrow. This medicine may be used for other purposes; ask your health care provider or pharmacist if you have questions. COMMON BRAND NAME(S): Neulasta What should I tell my health care provider before I take this medicine? They need to know if you have any of these conditions: -latex allergy -ongoing radiation therapy -sickle cell disease -skin reactions to acrylic adhesives (On-Body Injector only) -an unusual or allergic reaction to pegfilgrastim, filgrastim, other medicines, foods, dyes, or preservatives -pregnant or trying to get pregnant -breast-feeding How should I use this medicine? This medicine is for injection under the skin. If you get this medicine at home, you will be taught how to prepare and give the pre-filled syringe or how to use the On-body Injector. Refer to the patient Instructions for Use for detailed instructions. Use exactly as directed. Take your medicine at regular intervals. Do not take your medicine more often than directed. It is important that you put your used needles and syringes in a special sharps container. Do not put them in a trash can. If you do not have a sharps container, call your pharmacist or healthcare provider to get one. Talk to your pediatrician regarding the use of this medicine in children. Special care may be needed. Overdosage: If you think you have taken too much of this medicine contact a poison control center or emergency room at once. NOTE: This medicine is only for you. Do not share this medicine with others. What if I miss a dose? It is  important not to miss your dose. Call your doctor or health care professional if you miss your dose. If you miss a dose due to an On-body Injector failure or leakage, a new dose should be administered as soon as possible using a single prefilled syringe for manual use. What may interact with this medicine? Interactions have not been studied. Give your health care provider a list of all the medicines, herbs, non-prescription drugs, or dietary supplements you use. Also tell them if you smoke, drink alcohol, or use illegal drugs. Some items may interact with your medicine. This list may not describe all possible interactions. Give your health care provider a list of all the medicines, herbs, non-prescription drugs, or dietary supplements you use. Also tell them if you smoke, drink alcohol, or use illegal drugs. Some items may interact with your medicine. What should I watch for while using this medicine? You may need blood work done while you are taking this medicine. If you are going to need a MRI, CT scan, or other procedure, tell your doctor that you are using this medicine (On-Body Injector only). What side effects may I notice from receiving this medicine? Side effects that you should report to your doctor or health care professional as soon as possible: -allergic reactions like skin rash, itching or hives, swelling of the face, lips, or tongue -dizziness -fever -pain, redness, or irritation at site where injected -pinpoint red spots on the skin -shortness of breath or breathing problems -stomach or side pain, or pain at the shoulder -swelling -tiredness -trouble passing urine Side effects that usually do not require medical attention (report to your doctor   or health care professional if they continue or are bothersome): -bone pain -muscle pain This list may not describe all possible side effects. Call your doctor for medical advice about side effects. You may report side effects to FDA at  1-800-FDA-1088. Where should I keep my medicine? Keep out of the reach of children. Store pre-filled syringes in a refrigerator between 2 and 8 degrees C (36 and 46 degrees F). Do not freeze. Keep in carton to protect from light. Throw away this medicine if it is left out of the refrigerator for more than 48 hours. Throw away any unused medicine after the expiration date. NOTE: This sheet is a summary. It may not cover all possible information. If you have questions about this medicine, talk to your doctor, pharmacist, or health care provider.  2015, Elsevier/Gold Standard. (2013-06-18 16:14:05)  

## 2014-08-18 ENCOUNTER — Telehealth: Payer: Self-pay | Admitting: *Deleted

## 2014-08-18 NOTE — Telephone Encounter (Signed)
This RN called pt and obtained VM.  Message left to return call to this office for follow up post treatment.

## 2014-08-24 ENCOUNTER — Ambulatory Visit (HOSPITAL_BASED_OUTPATIENT_CLINIC_OR_DEPARTMENT_OTHER): Payer: 59 | Admitting: Oncology

## 2014-08-24 ENCOUNTER — Other Ambulatory Visit: Payer: Self-pay | Admitting: *Deleted

## 2014-08-24 ENCOUNTER — Telehealth: Payer: Self-pay | Admitting: Nurse Practitioner

## 2014-08-24 ENCOUNTER — Other Ambulatory Visit (HOSPITAL_BASED_OUTPATIENT_CLINIC_OR_DEPARTMENT_OTHER): Payer: 59

## 2014-08-24 VITALS — BP 135/66 | HR 66 | Temp 98.3°F | Resp 18 | Ht 63.0 in | Wt 168.8 lb

## 2014-08-24 DIAGNOSIS — C50411 Malignant neoplasm of upper-outer quadrant of right female breast: Secondary | ICD-10-CM

## 2014-08-24 DIAGNOSIS — Z170 Estrogen receptor positive status [ER+]: Secondary | ICD-10-CM

## 2014-08-24 LAB — COMPREHENSIVE METABOLIC PANEL (CC13)
ALK PHOS: 57 U/L (ref 40–150)
ALT: 11 U/L (ref 0–55)
AST: 12 U/L (ref 5–34)
Albumin: 3.5 g/dL (ref 3.5–5.0)
Anion Gap: 12 mEq/L — ABNORMAL HIGH (ref 3–11)
BILIRUBIN TOTAL: 0.33 mg/dL (ref 0.20–1.20)
BUN: 10.3 mg/dL (ref 7.0–26.0)
CHLORIDE: 104 meq/L (ref 98–109)
CO2: 24 mEq/L (ref 22–29)
Calcium: 8.9 mg/dL (ref 8.4–10.4)
Creatinine: 0.7 mg/dL (ref 0.6–1.1)
Glucose: 129 mg/dl (ref 70–140)
Potassium: 3.8 mEq/L (ref 3.5–5.1)
SODIUM: 140 meq/L (ref 136–145)
Total Protein: 6.3 g/dL — ABNORMAL LOW (ref 6.4–8.3)

## 2014-08-24 LAB — CBC WITH DIFFERENTIAL/PLATELET
BASO%: 1.1 % (ref 0.0–2.0)
Basophils Absolute: 0 10*3/uL (ref 0.0–0.1)
EOS%: 4.6 % (ref 0.0–7.0)
Eosinophils Absolute: 0.1 10*3/uL (ref 0.0–0.5)
HCT: 36.2 % (ref 34.8–46.6)
HGB: 12.1 g/dL (ref 11.6–15.9)
LYMPH%: 33.1 % (ref 14.0–49.7)
MCH: 27.2 pg (ref 25.1–34.0)
MCHC: 33.4 g/dL (ref 31.5–36.0)
MCV: 81.3 fL (ref 79.5–101.0)
MONO#: 0.3 10*3/uL (ref 0.1–0.9)
MONO%: 15.4 % — ABNORMAL HIGH (ref 0.0–14.0)
NEUT#: 0.8 10*3/uL — ABNORMAL LOW (ref 1.5–6.5)
NEUT%: 45.8 % (ref 38.4–76.8)
Platelets: 131 10*3/uL — ABNORMAL LOW (ref 145–400)
RBC: 4.46 10*6/uL (ref 3.70–5.45)
RDW: 15.7 % — ABNORMAL HIGH (ref 11.2–14.5)
WBC: 1.7 10*3/uL — ABNORMAL LOW (ref 3.9–10.3)
lymph#: 0.6 10*3/uL — ABNORMAL LOW (ref 0.9–3.3)

## 2014-08-24 NOTE — Telephone Encounter (Signed)
Appointments made and avs printed for patient,patient aware to call for her mri  Darlene Kelly

## 2014-08-24 NOTE — Telephone Encounter (Signed)
Called and left a message with 5/31 lab

## 2014-08-24 NOTE — Progress Notes (Signed)
[facility]  [phone] [phone]     ID: Darlene Kelly DOB: 1950-10-15  MR#: [id number]  WER#:[id number]  Patient Care Team: [name], MD as PCP - General (Family Medicine) [name], MD as Consulting Physician (General Surgery) [name], MD as Consulting Physician (Oncology) [name], MD as Consulting Physician (Radiation Oncology) [name], RN as Registered Nurse [name], RN as Registered Nurse [name], MD as Referring Physician (Cardiology) [name], MD as Referring Physician (Dermatology) [name], NP as Nurse Practitioner (Nurse Practitioner) PCP: [name], MD OTHER MD:  CHIEF COMPLAINT: Estrogen receptor positive breast cancer  CURRENT TREATMENT: Neoadjuvant chemotherapy   BREAST CANCER HISTORY: From the original intake note:  Latausha first noted a change in her right breast September 2015. At that time however she was consumed by the care of her mother-in-law, who had severe Alzheimer's disease. She finally passed in January 2016, but Darlene Kelly did not share the information regarding the right breast with her husband until late March 2016. At that point she was set up for bilateral diagnostic mammography with tomography at the breast Center, for 11/20/2014. This found the breast density to be category B. In the upper outer quadrant of the right breast there was a 4 cm irregular spiculated mass. There were no other findings of concern. The mass was palpable and firm, and by ultrasound measured 3.3 cm. The right axilla was negative sonographically.  Biopsy of the right breast mass in question was obtained for 03/22/2015. 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  On 07/20/2014 the patient underwent bilateral  breast MRI. This measured the large irregular enhancing mass in the right breast at the 10:00 position at 5.0 cm. There were no additional worrisome findings in the right breast, and no findings in the left breast or any abnormal appearing lymph nodes.  The patient's subsequent history is as detailed below  INTERVAL HISTORY: [name] returns today for follow up of her breast cancer. Today is day 9 cycle 1 of 4 planned cycles of cyclophosphamide and doxorubicin, to be followed by weekly paclitaxel 12.  REVIEW OF SYSTEMS: Everlean did "great" with cycle one. In fact she had no problems with nausea or vomiting, no mouth sores, no bony pain from the Neulasta shot, no changes in bowel habits and specifically no diarrhea or constipation problems. The worse problem she had was a little bit of fatigue on day 6. She took it easy that day, but she still walked and in fact she is walking twice a day and joining [facility] [employer]. She has not yet started to lose her hair, but her sister-in-law is a hairdresser and they are planning to visit the "wake shop" today while she still has her here so they can make an appropriate choice. A detailed review of systems today was otherwise noncontributory  PAST MEDICAL HISTORY: Past Medical History  Diagnosis Date  . Breast cancer of upper-outer quadrant of right female breast 07/16/2014  . Diabetes mellitus without complication   . Hypertension   . Skin cancer   . Heart murmur     PAST SURGICAL HISTORY: Past Surgical History  Procedure Laterality Date  . Portacath placement N/A 08/03/2014    Procedure: INSERTION PORT-A-CATH;  Surgeon: [name], MD;  Location: [facility];  Service: General;  Laterality: N/A;  FAMILY HISTORY Family History  Problem Relation Age of Onset  . Lung cancer Father    the patient's father died from lung cancer in the setting of tobacco abuse at the age of 87. The patient's mother died from COPD at the age of 88. The patient has 3 brothers, no  sisters. There is no history of breast or ovarian cancer in the family.  GYNECOLOGIC HISTORY:  No LMP recorded. Patient is postmenopausal. Menarche age 7, first live birth age 56. She is GX P3. She went through the change of life in her late 64s. She did not take hormone replacement.  SOCIAL HISTORY:  [place] worked for [employer], but is now retired. Her husband [name] is Programmer, systems for a [employer]. They have been married more than 40 years. Son [name] "[name] was "'s [name] is a climbing Acupuncturist in [place]. Son [name] "map" [name] is a Dealer in [place]. Daughter [name] is a branch bank or for [employer] in [place]. The patient has 3 grandchildren.    ADVANCED DIRECTIVES: In place   HEALTH MAINTENANCE: History  Substance Use Topics  . Smoking status: Former Smoker -- 0.50 packs/day for 12 years    Types: Cigarettes    Quit date: 04/02/1986  . Smokeless tobacco: Never Used  . Alcohol Use: 1.8 oz/week    3 Glasses of wine per week     Colonoscopy: Never  PAP:  Bone density: Never  Lipid panel:  Allergies  Allergen Reactions  . Latex Itching, Dermatitis and Rash  . Lipitor [Atorvastatin] Other (See Comments)    Bad leg cramps    Current Outpatient Prescriptions  Medication Sig Dispense Refill  . aspirin EC 81 MG tablet Take 81 mg by mouth at bedtime.     . cetirizine (ZYRTEC) 10 MG tablet Take 10 mg by mouth daily as needed for allergies.    Marland Kitchen dexamethasone (DECADRON) 4 MG tablet Take 2 tablets by mouth once a day on the day after chemotherapy and then take 2 tablets two times a day for 2 days. Take with food. 30 tablet 1  . HYDROcodone-acetaminophen (NORCO/VICODIN) 5-325 MG per tablet Take 1-2 tablets by mouth every 4 (four) hours as needed for moderate pain or severe pain. (Patient not taking: Reported on 08/11/2014) 30 tablet 0  . ibuprofen (ADVIL,MOTRIN) 200 MG tablet Take 400 mg by mouth every 6 (six) hours  as needed for headache or moderate pain.    Marland Kitchen lidocaine-prilocaine (EMLA) cream Apply topically as needed. Use as directed on port site 1 hour prior to IV access (Patient not taking: Reported on 08/11/2014) 1 each 3  . lisinopril (PRINIVIL,ZESTRIL) 10 MG tablet Take 10 mg by mouth at bedtime.   6  . LORazepam (ATIVAN) 0.5 MG tablet Take 1 tablet (0.5 mg total) by mouth at bedtime. 30 tablet 0  . MELATONIN PO Take 5 mg by mouth at bedtime.     . metFORMIN (GLUCOPHAGE) 1000 MG tablet Take 1,000 mg by mouth 2 (two) times daily with a meal.    . metoprolol succinate (TOPROL-XL) 100 MG 24 hr tablet Take 100 mg by mouth every evening.     . Omega-3 Fatty Acids (FISH OIL PO) Take 1 capsule by mouth every evening.     . ondansetron (ZOFRAN) 8 MG tablet Take 1 tablet (8 mg total) by mouth 2 (two) times daily as needed. Start on the third day after chemotherapy. 30 tablet 1  .  prochlorperazine (COMPAZINE) 10 MG tablet Take 1 tablet (10 mg total) by mouth every 6 (six) hours as needed (Nausea or vomiting). 30 tablet 1  . rosuvastatin (CRESTOR) 40 MG tablet Take by mouth daily. Pt takes 1/2 tablet(77m) on Mondays, Wednesdays, Fridays.     No current facility-administered medications for this visit.    OBJECTIVE: Middle-aged white woman in no acute distress Filed Vitals:   08/24/14 0809  BP: 135/66  Pulse: 66  Temp: 98.3 F (36.8 C)  Resp: 18     Body mass index is 29.91 kg/(m^2).    ECOG FS:0 - Asymptomatic  Sclerae unicteric, pupils round and equal Oropharynx clear and moist-- no thrush or other lesions No cervical or supraclavicular adenopathy Lungs no rales or rhonchi Heart regular rate and rhythm Abd soft, nontender, positive bowel sounds MSK no focal spinal tenderness, no upper extremity lymphedema Neuro: nonfocal, well oriented, appropriate affect Breasts: Deferred   LAB RESULTS:  CMP     Component Value Date/Time   NA 142 08/16/2014 0912   K 3.8 08/16/2014 0912   CO2 22  08/16/2014 0912   GLUCOSE 132 08/16/2014 0912   BUN 16.4 08/16/2014 0912   CREATININE 0.7 08/16/2014 0912   CALCIUM 9.3 08/16/2014 0912   PROT 7.4 07/21/2014 0818   ALBUMIN 4.1 07/21/2014 0818   AST 15 07/21/2014 0818   ALT 14 07/21/2014 0818   ALKPHOS 66 07/21/2014 0818   BILITOT 0.38 07/21/2014 0818    INo results found for: SPEP, UPEP  Lab Results  Component Value Date   WBC 1.7* 08/24/2014   NEUTROABS 0.8* 08/24/2014   HGB 12.1 08/24/2014   HCT 36.2 08/24/2014   MCV 81.3 08/24/2014   PLT 131* 08/24/2014      Chemistry      Component Value Date/Time   NA 142 08/16/2014 0912   K 3.8 08/16/2014 0912   CO2 22 08/16/2014 0912   BUN 16.4 08/16/2014 0912   CREATININE 0.7 08/16/2014 0912      Component Value Date/Time   CALCIUM 9.3 08/16/2014 0912   ALKPHOS 66 07/21/2014 0818   AST 15 07/21/2014 0818   ALT 14 07/21/2014 0818   BILITOT 0.38 07/21/2014 0818       No results found for: LABCA2  No components found for: LABCA125  No results for input(s): INR in the last 168 hours.  Urinalysis No results found for: COLORURINE, APPEARANCEUR, LABSPEC, PHURINE, GLUCOSEU, HGBUR, BILIRUBINUR, KETONESUR, PROTEINUR, UROBILINOGEN, NITRITE, LEUKOCYTESUR  STUDIES: Dg Chest 2 View  08/03/2014   CLINICAL DATA:  64year old female status post porta cath placement. Breast cancer. Abnormal appearing left hilum. Initial encounter.  EXAM: CHEST  2 VIEW  COMPARISON:  Portable chest 1230 hours today.  FINDINGS: Left chest IJ approach porta cath re - identified. The entire catheter course now is visible. The tip appears stable at the innominate vein confluence.  Low lung volumes. Persistent left perihilar and lower lobe Patchy and confluent pulmonary opacity. No pleural effusion identified. No pneumothorax or pulmonary edema.  Other mediastinal contours are within normal limits. Visualized tracheal air column is within normal limits. No acute osseous abnormality identified. Calcified  atherosclerosis of the aorta.  IMPRESSION: 1. Stable left chest porta cath. The entire catheter course is included on these images. 2. Abnormal but nonspecific left perihilar and lower lobe pulmonary opacity. Consider aspiration, pneumonia, less likely atelectasis. No pleural effusion. 3. Chest CT (IV contrast preferred) would evaluate further).   Electronically Signed   By: HGenevie Ann  M.D.   On: 08/03/2014 13:34   Dg Chest Port 1 View  08/03/2014   CLINICAL DATA:  64 year old female status post left porta cath placement. Breast cancer. Initial encounter.  EXAM: PORTABLE CHEST - 1 VIEW  COMPARISON:  None.  FINDINGS: Portable AP semi upright view at 1230 hours. Left chest IJ approach porta cath in place. The most cephalad aspect of the catheter is not included on this image. The catheter tip projects above the carina at the level of the innominate vein confluence. No pneumothorax.  There is an asymmetric appearance of the left hilum with mildly increased density and perihilar and retrocardiac opacity.  Other mediastinal contours are within normal limits. No pulmonary edema, pleural effusion or other confluent pulmonary opacity. Visualized tracheal air column is within normal limits.  IMPRESSION: 1. Left IJ approach porta cath placed. Tip projects at the innominate vein confluence level. The most cephalad aspect of the catheter is not included on this image. 2. Asymmetric appearance of the left hilum with perihilar/retrocardiac opacity. This is nonspecific and might simply reflect atelectasis. Recommend upright PA and lateral chest radiographs today if possible to evaluate further. PACU RN Bridget Hartshorn advised of the above by telephone on 08/03/2014 at 12:47 .   Electronically Signed   By: Genevie Ann M.D.   On: 08/03/2014 12:47   Dg C-arm 1-60 Min-no Report  08/03/2014   CLINICAL DATA: port-a-cath placement   C-ARM 1-60 MINUTES  Fluoroscopy was utilized by the requesting physician.  No radiographic  interpretation.      ASSESSMENT: 64 y.o.  woman status post right breast upper outer quadrant biopsy 07/13/2014, for a clinical T3 N0, stage IIB invasive ductal carcinoma, grade 3, estrogen receptor positive, HER-2 and progesterone receptor negative, with an MIB-1 of 40%  (1) neoadjuvant chemotherapy to start 08/15/2013, consisting of doxorubicin and cyclophosphamide in dose dense fashion 4, with Neulasta support, followed by weekly paclitaxel 12  (2) breast conserving surgery with sentinel lymph node sampling to follow chemotherapy  (3) adjuvant radiation to follow surgery  (4) anti-estrogens to follow radiation  PLAN: Kariyah did terrific with her first cycle of chemotherapy. In fact there was absolutely nothing to "fix". Her counts also are very favorable, with the neutrophils between 500 and 1000, which is optimal.  She will receive the second cycle next week and we will check labs only the following week. Then she will be seen with cycles 3 and 4 with off-week visits and after that she will go to the beach for the July 4 weekend.  I have entered a visit with me for July 12. That is the first day she will receive her weekly paclitaxel. She will have a repeat breast MRI the day before to assess initial response. I will make sure her surgeon is aware of this mid-point reassessment in case he wants to see her in preparation for surgery later on.  Tahja has a good understanding of the overall plan. She agrees with it. She knows the goal of treatment in her case is cure. She will call with any problems that may develop before her next visit here   [name], MD   08/24/2014 8:45 AM

## 2014-08-31 ENCOUNTER — Other Ambulatory Visit (HOSPITAL_BASED_OUTPATIENT_CLINIC_OR_DEPARTMENT_OTHER): Payer: 59 | Admitting: *Deleted

## 2014-08-31 ENCOUNTER — Ambulatory Visit (HOSPITAL_BASED_OUTPATIENT_CLINIC_OR_DEPARTMENT_OTHER): Payer: 59

## 2014-08-31 ENCOUNTER — Encounter: Payer: Self-pay | Admitting: *Deleted

## 2014-08-31 ENCOUNTER — Ambulatory Visit: Payer: 59

## 2014-08-31 ENCOUNTER — Other Ambulatory Visit (HOSPITAL_BASED_OUTPATIENT_CLINIC_OR_DEPARTMENT_OTHER): Payer: 59

## 2014-08-31 ENCOUNTER — Other Ambulatory Visit: Payer: Self-pay | Admitting: Oncology

## 2014-08-31 VITALS — BP 152/72 | HR 72 | Temp 97.9°F | Resp 18

## 2014-08-31 DIAGNOSIS — C50411 Malignant neoplasm of upper-outer quadrant of right female breast: Secondary | ICD-10-CM

## 2014-08-31 DIAGNOSIS — Z5111 Encounter for antineoplastic chemotherapy: Secondary | ICD-10-CM

## 2014-08-31 LAB — COMPREHENSIVE METABOLIC PANEL (CC13)
ALBUMIN: 3.5 g/dL (ref 3.5–5.0)
ALK PHOS: 65 U/L (ref 40–150)
ALT: 15 U/L (ref 0–55)
AST: 16 U/L (ref 5–34)
Anion Gap: 11 mEq/L (ref 3–11)
BUN: 11.3 mg/dL (ref 7.0–26.0)
CHLORIDE: 107 meq/L (ref 98–109)
CO2: 24 mEq/L (ref 22–29)
CREATININE: 0.7 mg/dL (ref 0.6–1.1)
Calcium: 8.6 mg/dL (ref 8.4–10.4)
Glucose: 182 mg/dl — ABNORMAL HIGH (ref 70–140)
Potassium: 3.5 mEq/L (ref 3.5–5.1)
Sodium: 143 mEq/L (ref 136–145)
Total Bilirubin: 0.29 mg/dL (ref 0.20–1.20)
Total Protein: 5.9 g/dL — ABNORMAL LOW (ref 6.4–8.3)

## 2014-08-31 LAB — CBC WITH DIFFERENTIAL/PLATELET
BASO%: 0.4 % (ref 0.0–2.0)
Basophils Absolute: 0 10*3/uL (ref 0.0–0.1)
EOS ABS: 0 10*3/uL (ref 0.0–0.5)
EOS%: 0.1 % (ref 0.0–7.0)
HCT: 35.6 % (ref 34.8–46.6)
HEMOGLOBIN: 12 g/dL (ref 11.6–15.9)
LYMPH#: 1 10*3/uL (ref 0.9–3.3)
LYMPH%: 10.5 % — ABNORMAL LOW (ref 14.0–49.7)
MCH: 27.5 pg (ref 25.1–34.0)
MCHC: 33.6 g/dL (ref 31.5–36.0)
MCV: 81.9 fL (ref 79.5–101.0)
MONO#: 0.4 10*3/uL (ref 0.1–0.9)
MONO%: 4.1 % (ref 0.0–14.0)
NEUT#: 7.7 10*3/uL — ABNORMAL HIGH (ref 1.5–6.5)
NEUT%: 84.9 % — ABNORMAL HIGH (ref 38.4–76.8)
Platelets: 125 10*3/uL — ABNORMAL LOW (ref 145–400)
RBC: 4.35 10*6/uL (ref 3.70–5.45)
RDW: 15.9 % — ABNORMAL HIGH (ref 11.2–14.5)
WBC: 9.1 10*3/uL (ref 3.9–10.3)

## 2014-08-31 MED ORDER — SODIUM CHLORIDE 0.9 % IJ SOLN
10.0000 mL | INTRAMUSCULAR | Status: DC | PRN
  Administered 2014-08-31: 10 mL
  Filled 2014-08-31: qty 10

## 2014-08-31 MED ORDER — SODIUM CHLORIDE 0.9 % IV SOLN
Freq: Once | INTRAVENOUS | Status: AC
  Administered 2014-08-31: 09:00:00 via INTRAVENOUS

## 2014-08-31 MED ORDER — SODIUM CHLORIDE 0.9 % IV SOLN
600.0000 mg/m2 | Freq: Once | INTRAVENOUS | Status: AC
  Administered 2014-08-31: 1120 mg via INTRAVENOUS
  Filled 2014-08-31: qty 56

## 2014-08-31 MED ORDER — PALONOSETRON HCL INJECTION 0.25 MG/5ML
0.2500 mg | Freq: Once | INTRAVENOUS | Status: AC
  Administered 2014-08-31: 0.25 mg via INTRAVENOUS

## 2014-08-31 MED ORDER — PALONOSETRON HCL INJECTION 0.25 MG/5ML
INTRAVENOUS | Status: AC
  Filled 2014-08-31: qty 5

## 2014-08-31 MED ORDER — HEPARIN SOD (PORK) LOCK FLUSH 100 UNIT/ML IV SOLN
500.0000 [IU] | Freq: Once | INTRAVENOUS | Status: AC | PRN
  Administered 2014-08-31: 500 [IU]
  Filled 2014-08-31: qty 5

## 2014-08-31 MED ORDER — DOXORUBICIN HCL CHEMO IV INJECTION 2 MG/ML
60.0000 mg/m2 | Freq: Once | INTRAVENOUS | Status: AC
  Administered 2014-08-31: 112 mg via INTRAVENOUS
  Filled 2014-08-31: qty 56

## 2014-08-31 MED ORDER — SODIUM CHLORIDE 0.9 % IV SOLN
Freq: Once | INTRAVENOUS | Status: AC
  Administered 2014-08-31: 10:00:00 via INTRAVENOUS
  Filled 2014-08-31: qty 5

## 2014-08-31 NOTE — Progress Notes (Signed)
Met with patient today at her 2nd cycle A/C.  She states she is doing remarkably well.  She denies any side effects except for some fatigue.  She states she joined the [employer] and has been working out.  Encouraged her to call with any needs or concerns.

## 2014-08-31 NOTE — Patient Instructions (Signed)
[facility] Discharge Instructions for Patients Receiving Chemotherapy  Today you received the following chemotherapy agents Adriamycin and Cytoxan.  To help prevent nausea and vomiting after your treatment, we encourage you to take your nausea medication as prescribed.   If you develop nausea and vomiting that is not controlled by your nausea medication, call the clinic.   BELOW ARE SYMPTOMS THAT SHOULD BE REPORTED IMMEDIATELY:  *FEVER GREATER THAN 100.5 F  *CHILLS WITH OR WITHOUT FEVER  NAUSEA AND VOMITING THAT IS NOT CONTROLLED WITH YOUR NAUSEA MEDICATION  *UNUSUAL SHORTNESS OF BREATH  *UNUSUAL BRUISING OR BLEEDING  TENDERNESS IN MOUTH AND THROAT WITH OR WITHOUT PRESENCE OF ULCERS  *URINARY PROBLEMS  *BOWEL PROBLEMS  UNUSUAL RASH Items with * indicate a potential emergency and should be followed up as soon as possible.  Feel free to call the clinic you have any questions or concerns. The clinic phone number is [phone].  Please show the CHEMO ALERT CARD at check-in to the Emergency Department and triage nurse.   

## 2014-09-01 ENCOUNTER — Ambulatory Visit (HOSPITAL_BASED_OUTPATIENT_CLINIC_OR_DEPARTMENT_OTHER): Payer: 59

## 2014-09-01 VITALS — BP 118/48 | HR 68 | Temp 99.0°F

## 2014-09-01 DIAGNOSIS — C50411 Malignant neoplasm of upper-outer quadrant of right female breast: Secondary | ICD-10-CM

## 2014-09-01 DIAGNOSIS — Z5189 Encounter for other specified aftercare: Secondary | ICD-10-CM

## 2014-09-01 MED ORDER — PEGFILGRASTIM INJECTION 6 MG/0.6ML ~~LOC~~
6.0000 mg | PREFILLED_SYRINGE | Freq: Once | SUBCUTANEOUS | Status: AC
  Administered 2014-09-01: 6 mg via SUBCUTANEOUS
  Filled 2014-09-01: qty 0.6

## 2014-09-14 ENCOUNTER — Other Ambulatory Visit (HOSPITAL_BASED_OUTPATIENT_CLINIC_OR_DEPARTMENT_OTHER): Payer: 59

## 2014-09-14 ENCOUNTER — Encounter: Payer: Self-pay | Admitting: Nurse Practitioner

## 2014-09-14 ENCOUNTER — Ambulatory Visit (HOSPITAL_BASED_OUTPATIENT_CLINIC_OR_DEPARTMENT_OTHER): Payer: 59 | Admitting: Nurse Practitioner

## 2014-09-14 ENCOUNTER — Encounter: Payer: Self-pay | Admitting: *Deleted

## 2014-09-14 ENCOUNTER — Ambulatory Visit (HOSPITAL_BASED_OUTPATIENT_CLINIC_OR_DEPARTMENT_OTHER): Payer: 59

## 2014-09-14 ENCOUNTER — Telehealth: Payer: Self-pay | Admitting: Nurse Practitioner

## 2014-09-14 VITALS — BP 141/88 | HR 73 | Temp 98.2°F | Resp 18 | Ht 63.0 in | Wt 167.9 lb

## 2014-09-14 DIAGNOSIS — Z5111 Encounter for antineoplastic chemotherapy: Secondary | ICD-10-CM

## 2014-09-14 DIAGNOSIS — Z170 Estrogen receptor positive status [ER+]: Secondary | ICD-10-CM | POA: Diagnosis not present

## 2014-09-14 DIAGNOSIS — C50411 Malignant neoplasm of upper-outer quadrant of right female breast: Secondary | ICD-10-CM

## 2014-09-14 LAB — COMPREHENSIVE METABOLIC PANEL (CC13)
ALBUMIN: 3.7 g/dL (ref 3.5–5.0)
ALT: 17 U/L (ref 0–55)
AST: 16 U/L (ref 5–34)
Alkaline Phosphatase: 83 U/L (ref 40–150)
Anion Gap: 12 mEq/L — ABNORMAL HIGH (ref 3–11)
BUN: 10 mg/dL (ref 7.0–26.0)
CO2: 24 mEq/L (ref 22–29)
Calcium: 9.3 mg/dL (ref 8.4–10.4)
Chloride: 104 mEq/L (ref 98–109)
Creatinine: 0.7 mg/dL (ref 0.6–1.1)
GLUCOSE: 130 mg/dL (ref 70–140)
POTASSIUM: 4.7 meq/L (ref 3.5–5.1)
Sodium: 141 mEq/L (ref 136–145)
TOTAL PROTEIN: 6.4 g/dL (ref 6.4–8.3)
Total Bilirubin: 0.32 mg/dL (ref 0.20–1.20)

## 2014-09-14 LAB — CBC WITH DIFFERENTIAL/PLATELET
BASO%: 0.5 % (ref 0.0–2.0)
Basophils Absolute: 0.1 10*3/uL (ref 0.0–0.1)
EOS%: 0 % (ref 0.0–7.0)
Eosinophils Absolute: 0 10*3/uL (ref 0.0–0.5)
HEMATOCRIT: 36.7 % (ref 34.8–46.6)
HGB: 12.2 g/dL (ref 11.6–15.9)
LYMPH%: 7.4 % — AB (ref 14.0–49.7)
MCH: 27.3 pg (ref 25.1–34.0)
MCHC: 33.2 g/dL (ref 31.5–36.0)
MCV: 82.4 fL (ref 79.5–101.0)
MONO#: 0.8 10*3/uL (ref 0.1–0.9)
MONO%: 6.8 % (ref 0.0–14.0)
NEUT#: 10.6 10*3/uL — ABNORMAL HIGH (ref 1.5–6.5)
NEUT%: 85.3 % — AB (ref 38.4–76.8)
Platelets: 153 10*3/uL (ref 145–400)
RBC: 4.45 10*6/uL (ref 3.70–5.45)
RDW: 16.7 % — ABNORMAL HIGH (ref 11.2–14.5)
WBC: 12.4 10*3/uL — ABNORMAL HIGH (ref 3.9–10.3)
lymph#: 0.9 10*3/uL (ref 0.9–3.3)

## 2014-09-14 MED ORDER — HEPARIN SOD (PORK) LOCK FLUSH 100 UNIT/ML IV SOLN
500.0000 [IU] | Freq: Once | INTRAVENOUS | Status: AC | PRN
  Administered 2014-09-14: 500 [IU]
  Filled 2014-09-14: qty 5

## 2014-09-14 MED ORDER — FOSAPREPITANT DIMEGLUMINE INJECTION 150 MG
Freq: Once | INTRAVENOUS | Status: AC
  Administered 2014-09-14: 11:00:00 via INTRAVENOUS
  Filled 2014-09-14: qty 5

## 2014-09-14 MED ORDER — SODIUM CHLORIDE 0.9 % IV SOLN
Freq: Once | INTRAVENOUS | Status: AC
  Administered 2014-09-14: 11:00:00 via INTRAVENOUS

## 2014-09-14 MED ORDER — PALONOSETRON HCL INJECTION 0.25 MG/5ML
0.2500 mg | Freq: Once | INTRAVENOUS | Status: AC
  Administered 2014-09-14: 0.25 mg via INTRAVENOUS

## 2014-09-14 MED ORDER — SODIUM CHLORIDE 0.9 % IV SOLN
600.0000 mg/m2 | Freq: Once | INTRAVENOUS | Status: AC
  Administered 2014-09-14: 1120 mg via INTRAVENOUS
  Filled 2014-09-14: qty 56

## 2014-09-14 MED ORDER — DOXORUBICIN HCL CHEMO IV INJECTION 2 MG/ML
60.0000 mg/m2 | Freq: Once | INTRAVENOUS | Status: AC
  Administered 2014-09-14: 112 mg via INTRAVENOUS
  Filled 2014-09-14: qty 56

## 2014-09-14 MED ORDER — SODIUM CHLORIDE 0.9 % IJ SOLN
10.0000 mL | INTRAMUSCULAR | Status: DC | PRN
  Administered 2014-09-14: 10 mL
  Filled 2014-09-14: qty 10

## 2014-09-14 MED ORDER — PALONOSETRON HCL INJECTION 0.25 MG/5ML
INTRAVENOUS | Status: AC
  Filled 2014-09-14: qty 5

## 2014-09-14 NOTE — Telephone Encounter (Signed)
Appointments added per pof and patient will get a new avs in chemo

## 2014-09-14 NOTE — Patient Instructions (Signed)
[facility] Discharge Instructions for Patients Receiving Chemotherapy  Today you received the following chemotherapy agents: Adriamycin, Cytoxan To help prevent nausea and vomiting after your treatment, we encourage you to take your nausea medication: compazine 10mg  every 6 hours as needed. If you develop nausea and vomiting that is not controlled by your nausea medication, call the clinic.   BELOW ARE SYMPTOMS THAT SHOULD BE REPORTED IMMEDIATELY:  *FEVER GREATER THAN 100.5 F  *CHILLS WITH OR WITHOUT FEVER  NAUSEA AND VOMITING THAT IS NOT CONTROLLED WITH YOUR NAUSEA MEDICATION  *UNUSUAL SHORTNESS OF BREATH  *UNUSUAL BRUISING OR BLEEDING  TENDERNESS IN MOUTH AND THROAT WITH OR WITHOUT PRESENCE OF ULCERS  *URINARY PROBLEMS  *BOWEL PROBLEMS  UNUSUAL RASH Items with * indicate a potential emergency and should be followed up as soon as possible.  Feel free to call the clinic you have any questions or concerns. The clinic phone number is [phone].  Please show the [place] at check-in to the Emergency Department and triage nurse.

## 2014-09-14 NOTE — Progress Notes (Signed)
Darlene Kelly  [phone] [phone]     ID: Darlene Kelly DOB: 1950-11-22  MR#: [id number]  VXB#:[id number]  Patient Care Team: [name], MD as PCP - General (Family Medicine) [name], MD as Consulting Physician (General Surgery) [name], MD as Consulting Physician (Oncology) [name], MD as Consulting Physician (Radiation Oncology) [name], RN as Registered Nurse [name], RN as Registered Nurse [name], MD as Referring Physician (Cardiology) [name], MD as Referring Physician (Dermatology) [name], NP as Nurse Practitioner (Nurse Practitioner) PCP: [name], MD OTHER MD:  CHIEF COMPLAINT: Estrogen receptor positive breast cancer  CURRENT TREATMENT: Neoadjuvant chemotherapy   BREAST CANCER HISTORY: From the original intake note:  Darlene Kelly first noted a change in her right breast September 2015. At that time however she was consumed by the care of her mother-in-law, who had severe Alzheimer's disease. She finally passed in January 2016, but Darlene Kelly did not share the information regarding the right breast with her husband until late March 2016. At that point she was set up for bilateral diagnostic mammography with tomography at the breast Center, for 11/20/2014. This found the breast density to be category B. In the upper outer quadrant of the right breast there was a 4 cm irregular spiculated mass. There were no other findings of concern. The mass was palpable and firm, and by ultrasound measured 3.3 cm. The right axilla was negative sonographically.  Biopsy of the right breast mass in question was obtained for 03/22/2015. 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  On 07/20/2014 the patient underwent bilateral  breast MRI. This measured the large irregular enhancing mass in the right breast at the 10:00 position at 5.0 cm. There were no additional worrisome findings in the right breast, and no findings in the left breast or any abnormal appearing lymph nodes.  The patient's subsequent history is as detailed below  INTERVAL HISTORY: Darlene Kelly returns today for follow up of her breast cancer, accompanied by her daughter. Today is day 1 cycle 3 of 4 planned cycles of cyclophosphamide and doxorubicin, to be followed by weekly paclitaxel 12.  REVIEW OF SYSTEMS: Darlene Kelly denies fevers, chills, nausea, or vomiting. She had constipation on just one day and colace resolved this. She is eating and drinking well. She has fatigue for 2 days, but still makes sure to be physically active the best she can. She denies mouth sores, rashes, or peripheral neuropathy symptoms. She finally had her hair buzzed off. A detailed review of systems is otherwise stable.  PAST MEDICAL HISTORY: Past Medical History  Diagnosis Date  . Breast cancer of upper-outer quadrant of right female breast 07/16/2014  . Diabetes mellitus without complication   . Hypertension   . Skin cancer   . Heart murmur     PAST SURGICAL HISTORY: Past Surgical History  Procedure Laterality Date  . Portacath placement N/A 08/03/2014    Procedure: INSERTION PORT-A-CATH;  Surgeon: [name], MD;  Location: [facility];  Service: General;  Laterality: N/A;    FAMILY HISTORY Family History  Problem Relation Age of Onset  . Lung cancer Father    the patient's father died from lung cancer in the setting of tobacco abuse at the age of 55. The patient's mother died from COPD at the age of 17. The patient has 3 brothers,  no sisters. There is no history of breast or ovarian cancer in the family.  GYNECOLOGIC HISTORY:  No LMP recorded. Patient is postmenopausal. Menarche age 34, first live birth age 66. She is GX P3. She went through the change of life in  her late 61s. She did not take hormone replacement.  SOCIAL HISTORY:  Jan worked for [employer], but is now retired. Her husband [name] is Programmer, systems for a [employer]. They have been married more than 40 years. Son [name] "[name] was "'s Felita Bump is a climbing Acupuncturist in [place]. Son [name] "map" Dylana Shaw is a Dealer in [place]. Daughter Euline Kimbler is a branch bank or for [employer] in [place]. The patient has 3 grandchildren.    ADVANCED DIRECTIVES: In place   HEALTH MAINTENANCE: History  Substance Use Topics  . Smoking status: Former Smoker -- 0.50 packs/day for 12 years    Types: Cigarettes    Quit date: 04/02/1986  . Smokeless tobacco: Never Used  . Alcohol Use: 1.8 oz/week    3 Glasses of wine per week     Colonoscopy: Never  PAP:  Bone density: Never  Lipid panel:  Allergies  Allergen Reactions  . Latex Itching, Dermatitis and Rash  . Lipitor [Atorvastatin] Other (See Comments)    Bad leg cramps    Current Outpatient Prescriptions  Medication Sig Dispense Refill  . aspirin EC 81 MG tablet Take 81 mg by mouth at bedtime.     Marland Kitchen dexamethasone (DECADRON) 4 MG tablet Take 2 tablets by mouth once a day on the day after chemotherapy and then take 2 tablets two times a day for 2 days. Take with food. 30 tablet 1  . lidocaine-prilocaine (EMLA) cream Apply topically as needed. Use as directed on port site 1 hour prior to IV access 1 each 3  . lisinopril (PRINIVIL,ZESTRIL) 10 MG tablet Take 10 mg by mouth at bedtime.   6  . MELATONIN PO Take 5 mg by mouth at bedtime.     . metFORMIN (GLUCOPHAGE) 1000 MG tablet Take 1,000 mg by mouth 2 (two) times daily with a meal.    . metoprolol succinate (TOPROL-XL) 100 MG 24 hr tablet Take 100 mg by mouth every evening.     . Omega-3 Fatty Acids (FISH OIL PO) Take 1 capsule by mouth every evening.     . ondansetron (ZOFRAN) 8 MG tablet Take 1 tablet (8 mg total) by mouth 2 (two) times  daily as needed. Start on the third day after chemotherapy. 30 tablet 1  . prochlorperazine (COMPAZINE) 10 MG tablet Take 1 tablet (10 mg total) by mouth every 6 (six) hours as needed (Nausea or vomiting). 30 tablet 1  . rosuvastatin (CRESTOR) 40 MG tablet Take by mouth daily. Pt takes 1/2 tablet(52m) on Mondays, Wednesdays, Fridays.    . cetirizine (ZYRTEC) 10 MG tablet Take 10 mg by mouth daily as needed for allergies.    .Marland KitchenHYDROcodone-acetaminophen (NORCO/VICODIN) 5-325 MG per tablet Take 1-2 tablets by mouth every 4 (four) hours as needed for moderate pain or severe pain. (Patient not taking: Reported on 08/11/2014) 30 tablet 0  . ibuprofen (ADVIL,MOTRIN) 200 MG tablet Take 400 mg by mouth every 6 (six) hours as needed for headache or moderate pain.    .Marland KitchenLORazepam (ATIVAN) 0.5 MG tablet Take 1 tablet (0.5 mg total) by mouth at bedtime. (Patient not taking: Reported on 09/14/2014) 30 tablet 0   No current facility-administered medications  for this visit.    OBJECTIVE: Middle-aged white woman in no acute distress Filed Vitals:   09/14/14 1005  BP: 141/88  Pulse: 73  Temp: 98.2 F (36.8 C)  Resp: 18     Body mass index is 29.75 kg/(m^2).    ECOG FS:0 - Asymptomatic  Skin: warm, dry  HEENT: sclerae anicteric, conjunctivae pink, oropharynx clear. No thrush or mucositis.  Lymph Nodes: No cervical or supraclavicular lymphadenopathy  Lungs: clear to auscultation bilaterally, no rales, wheezes, or rhonci  Heart: regular rate and rhythm  Abdomen: round, soft, non tender, positive bowel sounds  Musculoskeletal: No focal spinal tenderness, no peripheral edema  Neuro: non focal, well oriented, positive affect  Breasts: deferred  LAB RESULTS:  CMP     Component Value Date/Time   NA 143 08/31/2014 0847   K 3.5 08/31/2014 0847   CO2 24 08/31/2014 0847   GLUCOSE 182* 08/31/2014 0847   BUN 11.3 08/31/2014 0847   CREATININE 0.7 08/31/2014 0847   CALCIUM 8.6 08/31/2014 0847   PROT 5.9*  08/31/2014 0847   ALBUMIN 3.5 08/31/2014 0847   AST 16 08/31/2014 0847   ALT 15 08/31/2014 0847   ALKPHOS 65 08/31/2014 0847   BILITOT 0.29 08/31/2014 0847    INo results found for: SPEP, UPEP  Lab Results  Component Value Date   WBC 12.4* 09/14/2014   NEUTROABS 10.6* 09/14/2014   HGB 12.2 09/14/2014   HCT 36.7 09/14/2014   MCV 82.4 09/14/2014   PLT 153 09/14/2014      Chemistry      Component Value Date/Time   NA 143 08/31/2014 0847   K 3.5 08/31/2014 0847   CO2 24 08/31/2014 0847   BUN 11.3 08/31/2014 0847   CREATININE 0.7 08/31/2014 0847      Component Value Date/Time   CALCIUM 8.6 08/31/2014 0847   ALKPHOS 65 08/31/2014 0847   AST 16 08/31/2014 0847   ALT 15 08/31/2014 0847   BILITOT 0.29 08/31/2014 0847       No results found for: LABCA2  No components found for: LABCA125  No results for input(s): INR in the last 168 hours.  Urinalysis No results found for: COLORURINE, APPEARANCEUR, LABSPEC, PHURINE, GLUCOSEU, HGBUR, BILIRUBINUR, KETONESUR, PROTEINUR, UROBILINOGEN, NITRITE, LEUKOCYTESUR  STUDIES: No results found.  ASSESSMENT: 64 y.o. [place] woman status post right breast upper outer quadrant biopsy 07/13/2014, for a clinical T3 N0, stage IIB invasive ductal carcinoma, grade 3, estrogen receptor positive, HER-2 and progesterone receptor negative, with an MIB-1 of 40%  (1) neoadjuvant chemotherapy to start 08/15/2013, consisting of doxorubicin and cyclophosphamide in dose dense fashion 4, with Neulasta support, followed by weekly paclitaxel 12  (2) breast conserving surgery with sentinel lymph node sampling to follow chemotherapy  (3) adjuvant radiation to follow surgery  (4) anti-estrogens to follow radiation  PLAN: Darlene Kelly is performing remarkably well on chemo. The labs were reviewed in detail and were entirely stable. She will proceed with cycle 3 of doxorubicin and cyclophosphamide as planned today.   She will return in 1 week for labs  and a nadir visit. She understands and agrees with this plan. She knows the goal of treatment in her case is cure. She has been encouraged to call with any issues that might arise before her next visit here.   Laurie Panda, NP   09/14/2014 10:19 AM

## 2014-09-15 ENCOUNTER — Ambulatory Visit (HOSPITAL_BASED_OUTPATIENT_CLINIC_OR_DEPARTMENT_OTHER): Payer: 59

## 2014-09-15 VITALS — BP 116/44 | HR 78 | Temp 98.8°F

## 2014-09-15 DIAGNOSIS — Z5189 Encounter for other specified aftercare: Secondary | ICD-10-CM | POA: Diagnosis not present

## 2014-09-15 DIAGNOSIS — C50411 Malignant neoplasm of upper-outer quadrant of right female breast: Secondary | ICD-10-CM | POA: Diagnosis not present

## 2014-09-15 MED ORDER — PEGFILGRASTIM INJECTION 6 MG/0.6ML ~~LOC~~
6.0000 mg | PREFILLED_SYRINGE | Freq: Once | SUBCUTANEOUS | Status: AC
  Administered 2014-09-15: 6 mg via SUBCUTANEOUS
  Filled 2014-09-15: qty 0.6

## 2014-09-21 ENCOUNTER — Encounter: Payer: Self-pay | Admitting: Nurse Practitioner

## 2014-09-21 ENCOUNTER — Other Ambulatory Visit: Payer: Self-pay | Admitting: *Deleted

## 2014-09-21 ENCOUNTER — Ambulatory Visit (HOSPITAL_BASED_OUTPATIENT_CLINIC_OR_DEPARTMENT_OTHER): Payer: 59 | Admitting: Nurse Practitioner

## 2014-09-21 ENCOUNTER — Other Ambulatory Visit (HOSPITAL_BASED_OUTPATIENT_CLINIC_OR_DEPARTMENT_OTHER): Payer: 59

## 2014-09-21 VITALS — BP 99/55 | HR 74 | Temp 98.8°F | Resp 18 | Ht 63.0 in | Wt 163.5 lb

## 2014-09-21 DIAGNOSIS — C50411 Malignant neoplasm of upper-outer quadrant of right female breast: Secondary | ICD-10-CM

## 2014-09-21 DIAGNOSIS — Z170 Estrogen receptor positive status [ER+]: Secondary | ICD-10-CM

## 2014-09-21 LAB — CBC WITH DIFFERENTIAL/PLATELET
BASO%: 1.6 % (ref 0.0–2.0)
Basophils Absolute: 0 10*3/uL (ref 0.0–0.1)
EOS%: 0.2 % (ref 0.0–7.0)
Eosinophils Absolute: 0 10*3/uL (ref 0.0–0.5)
HCT: 32.2 % — ABNORMAL LOW (ref 34.8–46.6)
HEMOGLOBIN: 10.9 g/dL — AB (ref 11.6–15.9)
LYMPH%: 23.8 % (ref 14.0–49.7)
MCH: 27.9 pg (ref 25.1–34.0)
MCHC: 33.8 g/dL (ref 31.5–36.0)
MCV: 82.7 fL (ref 79.5–101.0)
MONO#: 0.1 10*3/uL (ref 0.1–0.9)
MONO%: 8.5 % (ref 0.0–14.0)
NEUT#: 0.8 10*3/uL — ABNORMAL LOW (ref 1.5–6.5)
NEUT%: 65.9 % (ref 38.4–76.8)
Platelets: 103 10*3/uL — ABNORMAL LOW (ref 145–400)
RBC: 3.89 10*6/uL (ref 3.70–5.45)
RDW: 17.4 % — ABNORMAL HIGH (ref 11.2–14.5)
WBC: 1.3 10*3/uL — AB (ref 3.9–10.3)
lymph#: 0.3 10*3/uL — ABNORMAL LOW (ref 0.9–3.3)

## 2014-09-21 MED ORDER — PROCHLORPERAZINE MALEATE 10 MG PO TABS: 10.0000 mg | ORAL_TABLET | Freq: Four times a day (QID) | ORAL | Status: DC | PRN

## 2014-09-21 NOTE — Progress Notes (Signed)
[facility]  Telephone:(336) [phone] Fax:(336) [phone]     ID: Jennaya Pogue DOB: 1951-03-05  MR#: [id number]  OIN#:[id number]  Patient Care Team: [name], MD as PCP - General (Family Medicine) [name], MD as Consulting Physician (General Surgery) [name], MD as Consulting Physician (Oncology) [name], MD as Consulting Physician (Radiation Oncology) [name], RN as Registered Nurse [name], RN as Registered Nurse [name], MD as Referring Physician (Cardiology) [name], MD as Referring Physician (Dermatology) [name], NP as Nurse Practitioner (Nurse Practitioner) PCP: [name], MD OTHER MD:  CHIEF COMPLAINT: Estrogen receptor positive breast cancer  CURRENT TREATMENT: Neoadjuvant chemotherapy   BREAST CANCER HISTORY: From the original intake note:  Delmy first noted a change in her right breast September 2015. At that time however she was consumed by the care of her mother-in-law, who had severe Alzheimer's disease. She finally passed in January 2016, but Jessabelle did not share the information regarding the right breast with her husband until late March 2016. At that point she was set up for bilateral diagnostic mammography with tomography at the breast Center, for 11/20/2014. This found the breast density to be category B. In the upper outer quadrant of the right breast there was a 4 cm irregular spiculated mass. There were no other findings of concern. The mass was palpable and firm, and by ultrasound measured 3.3 cm. The right axilla was negative sonographically.  Biopsy of the right breast mass in question was obtained for 03/22/2015. 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  On 07/20/2014 the patient underwent bilateral  breast MRI. This measured the large irregular enhancing mass in the right breast at the 10:00 position at 5.0 cm. There were no additional worrisome findings in the right breast, and no findings in the left breast or any abnormal appearing lymph nodes.  The patient's subsequent history is as detailed below  INTERVAL HISTORY: Laylanie returns today for follow up of her breast cancer, alone. Today is day 8 cycle 3 of 4 planned cycles of cyclophosphamide and doxorubicin, to be followed by weekly paclitaxel 12.  REVIEW OF SYSTEMS: Lindora managed this cycle well. She denies fevers, chills, nausea, or vomiting. Her bowel movements are looser, but she would not consider it diarrhea. She had taste changes that decreased her appetite, but they are back to normal now. She had fatigue over the weekend, but is improving today. She denies mouth sores, rashes, or peripheral neuropathy symptoms. A detailed review of systems is otherwise stable.  PAST MEDICAL HISTORY: Past Medical History  Diagnosis Date  . Breast cancer of upper-outer quadrant of right female breast 07/16/2014  . Diabetes mellitus without complication   . Hypertension   . Skin cancer   . Heart murmur     PAST SURGICAL HISTORY: Past Surgical History  Procedure Laterality Date  . Portacath placement N/A 08/03/2014    Procedure: INSERTION PORT-A-CATH;  Surgeon: [name], MD;  Location: [facility];  Service: General;  Laterality: N/A;    FAMILY HISTORY Family History  Problem Relation Age of Onset  . Lung cancer Father    the patient's father died from lung cancer in the setting of tobacco abuse at the age of 42. The patient's mother died from COPD at the age of 23. The patient has 3 brothers, no sisters. There  is no history of breast or ovarian cancer in the family.  GYNECOLOGIC HISTORY:  No LMP recorded. Patient is postmenopausal. Menarche age 70, first live birth age 6. She is GX P3. She went through the change of life in her  late 98s. She did not take hormone replacement.  SOCIAL HISTORY:  Eugenie worked for [employer], but is now retired. Her husband [name] is Programmer, systems for a [employer]. They have been married more than 40 years. Son [name] "[name] was "'s Aniqua Briere is a climbing Acupuncturist in [place]. Son [name] "map" Champagne Paletta is a Dealer in [place]. Daughter Yeila Morro is a branch bank or for [employer] in [place]. The patient has 3 grandchildren.    ADVANCED DIRECTIVES: In place   HEALTH MAINTENANCE: History  Substance Use Topics  . Smoking status: Former Smoker -- 0.50 packs/day for 12 years    Types: Cigarettes    Quit date: 04/02/1986  . Smokeless tobacco: Never Used  . Alcohol Use: 1.8 oz/week    3 Glasses of wine per week     Colonoscopy: Never  PAP:  Bone density: Never  Lipid panel:  Allergies  Allergen Reactions  . Latex Itching, Dermatitis and Rash  . Lipitor [Atorvastatin] Other (See Comments)    Bad leg cramps    Current Outpatient Prescriptions  Medication Sig Dispense Refill  . aspirin EC 81 MG tablet Take 81 mg by mouth at bedtime.     . cetirizine (ZYRTEC) 10 MG tablet Take 10 mg by mouth daily as needed for allergies.    Marland Kitchen ibuprofen (ADVIL,MOTRIN) 200 MG tablet Take 400 mg by mouth every 6 (six) hours as needed for headache or moderate pain.    Marland Kitchen lisinopril (PRINIVIL,ZESTRIL) 10 MG tablet Take 10 mg by mouth at bedtime.   6  . MELATONIN PO Take 5 mg by mouth at bedtime.     . metFORMIN (GLUCOPHAGE) 1000 MG tablet Take 1,000 mg by mouth 2 (two) times daily with a meal.    . metoprolol succinate (TOPROL-XL) 100 MG 24 hr tablet Take 100 mg by mouth every evening.     . Omega-3 Fatty Acids (FISH OIL PO) Take 1 capsule by mouth every evening.     . rosuvastatin (CRESTOR) 40 MG tablet Take by mouth daily. Pt takes 1/2 tablet(47m) on Mondays, Wednesdays, Fridays.    .Marland Kitchendexamethasone (DECADRON) 4 MG tablet Take 2 tablets by mouth  once a day on the day after chemotherapy and then take 2 tablets two times a day for 2 days. Take with food. (Patient not taking: Reported on 09/21/2014) 30 tablet 1  . HYDROcodone-acetaminophen (NORCO/VICODIN) 5-325 MG per tablet Take 1-2 tablets by mouth every 4 (four) hours as needed for moderate pain or severe pain. (Patient not taking: Reported on 08/11/2014) 30 tablet 0  . lidocaine-prilocaine (EMLA) cream Apply topically as needed. Use as directed on port site 1 hour prior to IV access (Patient not taking: Reported on 09/21/2014) 1 each 3  . LORazepam (ATIVAN) 0.5 MG tablet Take 1 tablet (0.5 mg total) by mouth at bedtime. (Patient not taking: Reported on 09/14/2014) 30 tablet 0  . ondansetron (ZOFRAN) 8 MG tablet Take 1 tablet (8 mg total) by mouth 2 (two) times daily as needed. Start on the third day after chemotherapy. (Patient not taking: Reported on 09/21/2014) 30 tablet 1  . prochlorperazine (COMPAZINE) 10 MG tablet Take 1 tablet (10 mg total) by mouth every 6 (six)  hours as needed (Nausea or vomiting). 30 tablet 1   No current facility-administered medications for this visit.    OBJECTIVE: Middle-aged white woman in no acute distress Filed Vitals:   09/21/14 0830  BP: 99/55  Pulse: 74  Temp: 98.8 F (37.1 C)  Resp: 18     Body mass index is 28.97 kg/(m^2).    ECOG FS:0 - Asymptomatic  Sclerae unicteric, pupils round and equal Oropharynx clear and moist-- no thrush or other lesions No cervical or supraclavicular adenopathy Lungs no rales or rhonchi Heart regular rate and rhythm Abd soft, nontender, positive bowel sounds MSK no focal spinal tenderness, no upper extremity lymphedema Neuro: nonfocal, well oriented, appropriate affect Breasts: deferred  LAB RESULTS:  CMP     Component Value Date/Time   NA 141 09/14/2014 0941   K 4.7 09/14/2014 0941   CO2 24 09/14/2014 0941   GLUCOSE 130 09/14/2014 0941   BUN 10.0 09/14/2014 0941   CREATININE 0.7 09/14/2014 0941   CALCIUM  9.3 09/14/2014 0941   PROT 6.4 09/14/2014 0941   ALBUMIN 3.7 09/14/2014 0941   AST 16 09/14/2014 0941   ALT 17 09/14/2014 0941   ALKPHOS 83 09/14/2014 0941   BILITOT 0.32 09/14/2014 0941    INo results found for: SPEP, UPEP  Lab Results  Component Value Date   WBC 1.3* 09/21/2014   NEUTROABS 0.8* 09/21/2014   HGB 10.9* 09/21/2014   HCT 32.2* 09/21/2014   MCV 82.7 09/21/2014   PLT 103* 09/21/2014      Chemistry      Component Value Date/Time   NA 141 09/14/2014 0941   K 4.7 09/14/2014 0941   CO2 24 09/14/2014 0941   BUN 10.0 09/14/2014 0941   CREATININE 0.7 09/14/2014 0941      Component Value Date/Time   CALCIUM 9.3 09/14/2014 0941   ALKPHOS 83 09/14/2014 0941   AST 16 09/14/2014 0941   ALT 17 09/14/2014 0941   BILITOT 0.32 09/14/2014 0941       No results found for: LABCA2  No components found for: NVBTY606  No results for input(s): INR in the last 168 hours.  Urinalysis No results found for: COLORURINE, APPEARANCEUR, LABSPEC, PHURINE, GLUCOSEU, HGBUR, BILIRUBINUR, KETONESUR, PROTEINUR, UROBILINOGEN, NITRITE, LEUKOCYTESUR  STUDIES: No results found.  ASSESSMENT: 64 y.o. [place] woman status post right breast upper outer quadrant biopsy 07/13/2014, for a clinical T3 N0, stage IIB invasive ductal carcinoma, grade 3, estrogen receptor positive, HER-2 and progesterone receptor negative, with an MIB-1 of 40%  (1) neoadjuvant chemotherapy to start 08/15/2013, consisting of doxorubicin and cyclophosphamide in dose dense fashion 4, with Neulasta support, followed by weekly paclitaxel 12  (2) breast conserving surgery with sentinel lymph node sampling to follow chemotherapy  (3) adjuvant radiation to follow surgery  (4) anti-estrogens to follow radiation  PLAN: Justine looks and feels well today. The labs were reviewed in detail and her [place] is down to 0.8, while her hgb is down to 10.9. She is asymptomatic at this time. We will continue to monitor these  values.  She will return in 1 week for her 4th and final cycle of doxorubicin and cyclophoshphamide. She understands and agrees with this plan. She knows the goal of treatment in her case is cure. She has been encouraged to call with any issues that might arise before her next visit here.   Laurie Panda, NP   09/21/2014 9:47 AM

## 2014-09-28 ENCOUNTER — Ambulatory Visit (HOSPITAL_BASED_OUTPATIENT_CLINIC_OR_DEPARTMENT_OTHER): Payer: 59

## 2014-09-28 ENCOUNTER — Other Ambulatory Visit (HOSPITAL_BASED_OUTPATIENT_CLINIC_OR_DEPARTMENT_OTHER): Payer: 59

## 2014-09-28 ENCOUNTER — Ambulatory Visit (HOSPITAL_BASED_OUTPATIENT_CLINIC_OR_DEPARTMENT_OTHER): Payer: 59 | Admitting: Nurse Practitioner

## 2014-09-28 ENCOUNTER — Encounter: Payer: Self-pay | Admitting: Nurse Practitioner

## 2014-09-28 VITALS — BP 142/72 | HR 99 | Temp 98.6°F | Resp 18 | Ht 63.0 in | Wt 163.0 lb

## 2014-09-28 DIAGNOSIS — C50411 Malignant neoplasm of upper-outer quadrant of right female breast: Secondary | ICD-10-CM

## 2014-09-28 DIAGNOSIS — Z170 Estrogen receptor positive status [ER+]: Secondary | ICD-10-CM

## 2014-09-28 DIAGNOSIS — R05 Cough: Secondary | ICD-10-CM | POA: Diagnosis not present

## 2014-09-28 DIAGNOSIS — Z5111 Encounter for antineoplastic chemotherapy: Secondary | ICD-10-CM

## 2014-09-28 DIAGNOSIS — R059 Cough, unspecified: Secondary | ICD-10-CM

## 2014-09-28 LAB — COMPREHENSIVE METABOLIC PANEL (CC13)
ALBUMIN: 3.6 g/dL (ref 3.5–5.0)
ALT: 18 U/L (ref 0–55)
AST: 17 U/L (ref 5–34)
Alkaline Phosphatase: 98 U/L (ref 40–150)
Anion Gap: 10 mEq/L (ref 3–11)
BUN: 6.1 mg/dL — ABNORMAL LOW (ref 7.0–26.0)
CO2: 26 mEq/L (ref 22–29)
Calcium: 8.9 mg/dL (ref 8.4–10.4)
Chloride: 103 mEq/L (ref 98–109)
Creatinine: 0.7 mg/dL (ref 0.6–1.1)
EGFR: >90 mL/min/{1.73_m2} (ref >90)
GLUCOSE: 151 mg/dL — AB (ref 70–140)
Potassium: 4.1 mEq/L (ref 3.5–5.1)
SODIUM: 139 meq/L (ref 136–145)
Total Bilirubin: 0.55 mg/dL (ref 0.20–1.20)
Total Protein: 6.3 g/dL — ABNORMAL LOW (ref 6.4–8.3)

## 2014-09-28 LAB — CBC WITH DIFFERENTIAL/PLATELET
BASO%: 0.7 % (ref 0.0–2.0)
Basophils Absolute: 0.1 10*3/uL (ref 0.0–0.1)
EOS%: 0 % (ref 0.0–7.0)
Eosinophils Absolute: 0 10*3/uL (ref 0.0–0.5)
HCT: 32.9 % — ABNORMAL LOW (ref 34.8–46.6)
HGB: 10.9 g/dL — ABNORMAL LOW (ref 11.6–15.9)
LYMPH%: 3.9 % — ABNORMAL LOW (ref 14.0–49.7)
MCH: 27.8 pg (ref 25.1–34.0)
MCHC: 33.3 g/dL (ref 31.5–36.0)
MCV: 83.6 fL (ref 79.5–101.0)
MONO#: 0.9 10*3/uL (ref 0.1–0.9)
MONO%: 7 % (ref 0.0–14.0)
NEUT#: 11 10*3/uL — ABNORMAL HIGH (ref 1.5–6.5)
NEUT%: 88.4 % — ABNORMAL HIGH (ref 38.4–76.8)
Platelets: 150 10*3/uL (ref 145–400)
RBC: 3.93 10*6/uL (ref 3.70–5.45)
RDW: 21 % — ABNORMAL HIGH (ref 11.2–14.5)
WBC: 12.4 10*3/uL — ABNORMAL HIGH (ref 3.9–10.3)
lymph#: 0.5 10*3/uL — ABNORMAL LOW (ref 0.9–3.3)

## 2014-09-28 MED ORDER — PALONOSETRON HCL INJECTION 0.25 MG/5ML
INTRAVENOUS | Status: AC
  Filled 2014-09-28: qty 5

## 2014-09-28 MED ORDER — HYDROCODONE-HOMATROPINE 5-1.5 MG/5ML PO SYRP: 5.0000 mL | ORAL_SOLUTION | Freq: Four times a day (QID) | ORAL | Status: DC | PRN

## 2014-09-28 MED ORDER — HEPARIN SOD (PORK) LOCK FLUSH 100 UNIT/ML IV SOLN
500.0000 [IU] | Freq: Once | INTRAVENOUS | Status: AC | PRN
  Administered 2014-09-28: 500 [IU]
  Filled 2014-09-28: qty 5

## 2014-09-28 MED ORDER — SODIUM CHLORIDE 0.9 % IV SOLN
Freq: Once | INTRAVENOUS | Status: AC
  Administered 2014-09-28: 09:00:00 via INTRAVENOUS

## 2014-09-28 MED ORDER — SODIUM CHLORIDE 0.9 % IV SOLN
600.0000 mg/m2 | Freq: Once | INTRAVENOUS | Status: AC
  Administered 2014-09-28: 1120 mg via INTRAVENOUS
  Filled 2014-09-28: qty 56

## 2014-09-28 MED ORDER — DOXORUBICIN HCL CHEMO IV INJECTION 2 MG/ML
60.0000 mg/m2 | Freq: Once | INTRAVENOUS | Status: AC
  Administered 2014-09-28: 112 mg via INTRAVENOUS
  Filled 2014-09-28: qty 56

## 2014-09-28 MED ORDER — SODIUM CHLORIDE 0.9 % IV SOLN
Freq: Once | INTRAVENOUS | Status: AC
  Administered 2014-09-28: 10:00:00 via INTRAVENOUS
  Filled 2014-09-28: qty 5

## 2014-09-28 MED ORDER — PALONOSETRON HCL INJECTION 0.25 MG/5ML
0.2500 mg | Freq: Once | INTRAVENOUS | Status: AC
  Administered 2014-09-28: 0.25 mg via INTRAVENOUS

## 2014-09-28 MED ORDER — SODIUM CHLORIDE 0.9 % IJ SOLN
10.0000 mL | INTRAMUSCULAR | Status: DC | PRN
  Administered 2014-09-28: 10 mL
  Filled 2014-09-28: qty 10

## 2014-09-28 NOTE — Patient Instructions (Signed)
[facility] Discharge Instructions for Patients Receiving Chemotherapy  Today you received the following chemotherapy agents: Adriamycin, Cytoxan To help prevent nausea and vomiting after your treatment, we encourage you to take your nausea medication: compazine 10mg  every 6 hours as needed. If you develop nausea and vomiting that is not controlled by your nausea medication, call the clinic.   BELOW ARE SYMPTOMS THAT SHOULD BE REPORTED IMMEDIATELY:  *FEVER GREATER THAN 100.5 F  *CHILLS WITH OR WITHOUT FEVER  NAUSEA AND VOMITING THAT IS NOT CONTROLLED WITH YOUR NAUSEA MEDICATION  *UNUSUAL SHORTNESS OF BREATH  *UNUSUAL BRUISING OR BLEEDING  TENDERNESS IN MOUTH AND THROAT WITH OR WITHOUT PRESENCE OF ULCERS  *URINARY PROBLEMS  *BOWEL PROBLEMS  UNUSUAL RASH Items with * indicate a potential emergency and should be followed up as soon as possible.  Feel free to call the clinic you have any questions or concerns. The clinic phone number is [phone].  Please show the [place] at check-in to the Emergency Department and triage nurse.

## 2014-09-28 NOTE — Progress Notes (Signed)
[place]  Telephone:(336) [phone] Fax:(336) [phone]     ID: Darlene Kelly DOB: 1951-02-07  MR#: [id number]  SJG#:[id number]  Patient Care Team: [name], MD as PCP - General (Family Medicine) [name], MD as Consulting Physician (General Surgery) [name], MD as Consulting Physician (Oncology) [name], MD as Consulting Physician (Radiation Oncology) [name], RN as Registered Nurse [name], RN as Registered Nurse [name], MD as Referring Physician (Cardiology) [name], MD as Referring Physician (Dermatology) [name], NP as Nurse Practitioner (Nurse Practitioner) PCP: [name], MD OTHER MD:  CHIEF COMPLAINT: Estrogen receptor positive breast cancer  CURRENT TREATMENT: Neoadjuvant chemotherapy   BREAST CANCER HISTORY: From the original intake note:  Darlene Kelly first noted a change in her right breast September 2015. At that time however she was consumed by the care of her mother-in-law, who had severe Alzheimer's disease. She finally passed in January 2016, but Darlene Kelly did not share the information regarding the right breast with her husband until late March 2016. At that point she was set up for bilateral diagnostic mammography with tomography at the breast Center, for 11/20/2014. This found the breast density to be category B. In the upper outer quadrant of the right breast there was a 4 cm irregular spiculated mass. There were no other findings of concern. The mass was palpable and firm, and by ultrasound measured 3.3 cm. The right axilla was negative sonographically.  Biopsy of the right breast mass in question was obtained for 03/22/2015. 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  On 07/20/2014 the patient underwent bilateral  breast MRI. This measured the large irregular enhancing mass in the right breast at the 10:00 position at 5.0 cm. There were no additional worrisome findings in the right breast, and no findings in the left breast or any abnormal appearing lymph nodes.  The patient's subsequent history is as detailed below  INTERVAL HISTORY: Darlene Kelly returns today for follow up of her breast cancer, accompanied by a friend. Today is day 1 cycle 4 of 4 planned cycles of cyclophosphamide and doxorubicin, to be followed by weekly paclitaxel 12.  REVIEW OF SYSTEMS: Darlene Kelly fevers, chills, nausea, or vomiting. She had constipation for 1 day but this resolved on her own. She is eating and drinking well now that her taste is back, but it typically wanes with treatment. New this week is vocal hoarseness caused by a dry hacking cough. This keeps her up at night when usually she is an easy sleeper. She Kelly mouth sores, rashes, or peripheral neuropathy symptoms. A detailed review of systems is otherwise stable.  PAST MEDICAL HISTORY: Past Medical History  Diagnosis Date  . Breast cancer of upper-outer quadrant of right female breast 07/16/2014  . Diabetes mellitus without complication   . Hypertension   . Skin cancer   . Heart murmur     PAST SURGICAL HISTORY: Past Surgical History  Procedure Laterality Date  . Portacath placement N/A 08/03/2014    Procedure: INSERTION PORT-A-CATH;  Surgeon: [name], MD;  Location: [facility];  Service: General;  Laterality: N/A;    FAMILY HISTORY Family History  Problem Relation Age of Onset  . Lung cancer Father    the patient's father died from lung cancer in the setting of tobacco abuse at the age of 42. The patient's mother  died from COPD at the age of 50. The patient has 3 brothers, no sisters. There is no history of breast or ovarian cancer in the family.  GYNECOLOGIC HISTORY:  No LMP recorded. Patient is postmenopausal. Menarche age 74, first live birth  age 89. She is GX P3. She went through the change of life in her late 74s. She did not take hormone replacement.  SOCIAL HISTORY:  Darlene Kelly worked for [employer], but is now retired. Her husband Darlene Kelly is Programmer, systems for a [employer]. They have been married more than 40 years. Son Darlene Kotyk "[name] was "'s Darlene Kelly is a climbing Acupuncturist in [place]. 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 [name] is a branch bank or for [employer] in [place]. The patient has 3 grandchildren.    ADVANCED DIRECTIVES: In place   HEALTH MAINTENANCE: History  Substance Use Topics  . Smoking status: Former Smoker -- 0.50 packs/day for 12 years    Types: Cigarettes    Quit date: 04/02/1986  . Smokeless tobacco: Never Used  . Alcohol Use: 1.8 oz/week    3 Glasses of wine per week     Colonoscopy: Never  PAP:  Bone density: Never  Lipid panel:  Allergies  Allergen Reactions  . Latex Itching, Dermatitis and Rash  . Lipitor [Atorvastatin] Other (See Comments)    Bad leg cramps    Current Outpatient Prescriptions  Medication Sig Dispense Refill  . aspirin EC 81 MG tablet Take 81 mg by mouth at bedtime.     . cetirizine (ZYRTEC) 10 MG tablet Take 10 mg by mouth daily as needed for allergies.    Marland Kitchen dexamethasone (DECADRON) 4 MG tablet Take 2 tablets by mouth once a day on the day after chemotherapy and then take 2 tablets two times a day for 2 days. Take with food. 30 tablet 1  . lidocaine-prilocaine (EMLA) cream Apply topically as needed. Use as directed on port site 1 hour prior to IV access 1 each 3  . lisinopril (PRINIVIL,ZESTRIL) 10 MG tablet Take 10 mg by mouth at bedtime.   6  . MELATONIN PO Take 5 mg by mouth at bedtime.     . metFORMIN (GLUCOPHAGE) 1000 MG tablet Take 1,000 mg by mouth 2 (two) times daily with a meal.    . metoprolol succinate (TOPROL-XL) 100 MG 24 hr tablet Take 100 mg by mouth every evening.     . Omega-3  Fatty Acids (FISH OIL PO) Take 1 capsule by mouth every evening.     . prochlorperazine (COMPAZINE) 10 MG tablet Take 1 tablet (10 mg total) by mouth every 6 (six) hours as needed (Nausea or vomiting). 30 tablet 1  . rosuvastatin (CRESTOR) 40 MG tablet Take by mouth daily. Pt takes 1/2 tablet(76m) on Mondays, Wednesdays, Fridays.    .Marland KitchenHYDROcodone-acetaminophen (NORCO/VICODIN) 5-325 MG per tablet Take 1-2 tablets by mouth every 4 (four) hours as needed for moderate pain or severe pain. (Patient not taking: Reported on 08/11/2014) 30 tablet 0  . HYDROcodone-homatropine (HYCODAN) 5-1.5 MG/5ML syrup Take 5 mLs by mouth every 6 (six) hours as needed for cough. 120 mL 0  . ibuprofen (ADVIL,MOTRIN) 200 MG tablet Take 400 mg by mouth every 6 (six) hours as needed for headache or moderate pain.    .Marland KitchenLORazepam (ATIVAN) 0.5 MG tablet Take 1 tablet (0.5 mg total) by mouth at bedtime. (Patient not taking: Reported on 09/14/2014) 30 tablet 0  .  ondansetron (ZOFRAN) 8 MG tablet Take 1 tablet (8 mg total) by mouth 2 (two) times daily as needed. Start on the third day after chemotherapy. (Patient not taking: Reported on 09/21/2014) 30 tablet 1   No current facility-administered medications for this visit.    OBJECTIVE: Middle-aged white woman in no acute distress Filed Vitals:   09/28/14 0823  BP: 142/72  Pulse: 99  Temp: 98.6 F (37 C)  Resp: 18     Body mass index is 28.88 kg/(m^2).    ECOG FS:0 - Asymptomatic  Skin: warm, dry  HEENT: sclerae anicteric, conjunctivae pink, oropharynx clear. No thrush or mucositis.  Lymph Nodes: No cervical or supraclavicular lymphadenopathy  Lungs: clear to auscultation bilaterally, no rales, wheezes, or rhonci  Heart: regular rate and rhythm  Abdomen: round, soft, non tender, positive bowel sounds  Musculoskeletal: No focal spinal tenderness, no peripheral edema  Neuro: non focal, well oriented, positive affect  Breasts: deferred  LAB RESULTS:  CMP     Component  Value Date/Time   NA 139 09/28/2014 0807   K 4.1 09/28/2014 0807   CO2 26 09/28/2014 0807   GLUCOSE 151* 09/28/2014 0807   BUN 6.1* 09/28/2014 0807   CREATININE 0.7 09/28/2014 0807   CALCIUM 8.9 09/28/2014 0807   PROT 6.3* 09/28/2014 0807   ALBUMIN 3.6 09/28/2014 0807   AST 17 09/28/2014 0807   ALT 18 09/28/2014 0807   ALKPHOS 98 09/28/2014 0807   BILITOT 0.55 09/28/2014 0807    INo results found for: SPEP, UPEP  Lab Results  Component Value Date   WBC 12.4* 09/28/2014   NEUTROABS 11.0* 09/28/2014   HGB 10.9* 09/28/2014   HCT 32.9* 09/28/2014   MCV 83.6 09/28/2014   PLT 150 09/28/2014      Chemistry      Component Value Date/Time   NA 139 09/28/2014 0807   K 4.1 09/28/2014 0807   CO2 26 09/28/2014 0807   BUN 6.1* 09/28/2014 0807   CREATININE 0.7 09/28/2014 0807      Component Value Date/Time   CALCIUM 8.9 09/28/2014 0807   ALKPHOS 98 09/28/2014 0807   AST 17 09/28/2014 0807   ALT 18 09/28/2014 0807   BILITOT 0.55 09/28/2014 0807       No results found for: LABCA2  No components found for: [id number]  No results for input(s): INR in the last 168 hours.  Urinalysis No results found for: COLORURINE, APPEARANCEUR, LABSPEC, PHURINE, GLUCOSEU, HGBUR, BILIRUBINUR, KETONESUR, PROTEINUR, UROBILINOGEN, NITRITE, LEUKOCYTESUR  STUDIES: No results found.  ASSESSMENT: 64 y.o. [place] woman status post right breast upper outer quadrant biopsy 07/13/2014, for a clinical T3 N0, stage IIB invasive ductal carcinoma, grade 3, estrogen receptor positive, HER-2 and progesterone receptor negative, with an MIB-1 of 40%  (1) neoadjuvant chemotherapy to start 08/15/2013, consisting of doxorubicin and cyclophosphamide in dose dense fashion 4, with Neulasta support, followed by weekly paclitaxel 12  (2) breast conserving surgery with sentinel lymph node sampling to follow chemotherapy  (3) adjuvant radiation to follow surgery  (4) anti-estrogens to follow  radiation  PLAN: Zannie continues to manage treatment well. The labs were reviewed in detail and her [place] has improved to 11.0, while her hgb is stable. She will proceed with her 4th and final cycle of doxorubicin and cyclophosphamide today as planned.  She will try OTC robitussin for her dry cough, but I have also written her for a hycodan syrup to fill if the robitussin is ineffective. She is headed to the [facility]  for a week and would not have been able to return for this script.   Eleena is scheduled for a breast MRI in 2 weeks. The next day she will have a follow up visit with Dr. Jana Hakim. They will discuss the results as well as her next regimen, paclitaxel weekly x12. She understands and agrees with this plan. She knows the goal of treatment in her case is cure. She has been encouraged to call with any issues that might arise before her next visit here.   Laurie Panda, NP   09/28/2014 8:46 AM

## 2014-09-29 ENCOUNTER — Ambulatory Visit (HOSPITAL_BASED_OUTPATIENT_CLINIC_OR_DEPARTMENT_OTHER): Payer: 59

## 2014-09-29 VITALS — BP 137/71 | HR 87 | Temp 98.2°F

## 2014-09-29 DIAGNOSIS — C50411 Malignant neoplasm of upper-outer quadrant of right female breast: Secondary | ICD-10-CM | POA: Diagnosis not present

## 2014-09-29 DIAGNOSIS — Z5189 Encounter for other specified aftercare: Secondary | ICD-10-CM | POA: Diagnosis not present

## 2014-09-29 MED ORDER — PEGFILGRASTIM INJECTION 6 MG/0.6ML ~~LOC~~
6.0000 mg | PREFILLED_SYRINGE | Freq: Once | SUBCUTANEOUS | Status: AC
  Administered 2014-09-29: 6 mg via SUBCUTANEOUS
  Filled 2014-09-29: qty 0.6

## 2014-09-29 NOTE — Patient Instructions (Signed)
Pegfilgrastim injection What is this medicine? PEGFILGRASTIM (peg fil GRA stim) is a long-acting granulocyte colony-stimulating factor that stimulates the growth of neutrophils, a type of white blood cell important in the body's fight against infection. It is used to reduce the incidence of fever and infection in patients with certain types of cancer who are receiving chemotherapy that affects the bone marrow. This medicine may be used for other purposes; ask your health care provider or pharmacist if you have questions. COMMON BRAND NAME(S): Neulasta What should I tell my health care provider before I take this medicine? They need to know if you have any of these conditions: -latex allergy -ongoing radiation therapy -sickle cell disease -skin reactions to acrylic adhesives (On-Body Injector only) -an unusual or allergic reaction to pegfilgrastim, filgrastim, other medicines, foods, dyes, or preservatives -pregnant or trying to get pregnant -breast-feeding How should I use this medicine? This medicine is for injection under the skin. If you get this medicine at home, you will be taught how to prepare and give the pre-filled syringe or how to use the On-body Injector. Refer to the patient Instructions for Use for detailed instructions. Use exactly as directed. Take your medicine at regular intervals. Do not take your medicine more often than directed. It is important that you put your used needles and syringes in a special sharps container. Do not put them in a trash can. If you do not have a sharps container, call your pharmacist or healthcare provider to get one. Talk to your pediatrician regarding the use of this medicine in children. Special care may be needed. Overdosage: If you think you have taken too much of this medicine contact a poison control center or emergency room at once. NOTE: This medicine is only for you. Do not share this medicine with others. What if I miss a dose? It is  important not to miss your dose. Call your doctor or health care professional if you miss your dose. If you miss a dose due to an On-body Injector failure or leakage, a new dose should be administered as soon as possible using a single prefilled syringe for manual use. What may interact with this medicine? Interactions have not been studied. Give your health care provider a list of all the medicines, herbs, non-prescription drugs, or dietary supplements you use. Also tell them if you smoke, drink alcohol, or use illegal drugs. Some items may interact with your medicine. This list may not describe all possible interactions. Give your health care provider a list of all the medicines, herbs, non-prescription drugs, or dietary supplements you use. Also tell them if you smoke, drink alcohol, or use illegal drugs. Some items may interact with your medicine. What should I watch for while using this medicine? You may need blood work done while you are taking this medicine. If you are going to need a MRI, CT scan, or other procedure, tell your doctor that you are using this medicine (On-Body Injector only). What side effects may I notice from receiving this medicine? Side effects that you should report to your doctor or health care professional as soon as possible: -allergic reactions like skin rash, itching or hives, swelling of the face, lips, or tongue -dizziness -fever -pain, redness, or irritation at site where injected -pinpoint red spots on the skin -shortness of breath or breathing problems -stomach or side pain, or pain at the shoulder -swelling -tiredness -trouble passing urine Side effects that usually do not require medical attention (report to your doctor   or health care professional if they continue or are bothersome): -bone pain -muscle pain This list may not describe all possible side effects. Call your doctor for medical advice about side effects. You may report side effects to FDA at  1-800-FDA-1088. Where should I keep my medicine? Keep out of the reach of children. Store pre-filled syringes in a refrigerator between 2 and 8 degrees C (36 and 46 degrees F). Do not freeze. Keep in carton to protect from light. Throw away this medicine if it is left out of the refrigerator for more than 48 hours. Throw away any unused medicine after the expiration date. NOTE: This sheet is a summary. It may not cover all possible information. If you have questions about this medicine, talk to your doctor, pharmacist, or health care provider.  2015, Elsevier/Gold Standard. (2013-06-18 16:14:05)  

## 2014-10-11 ENCOUNTER — Ambulatory Visit
Admission: RE | Admit: 2014-10-11 | Discharge: 2014-10-11 | Disposition: A | Discharge status: Home / Self Care | Payer: 59 | Source: Ambulatory Visit | Attending: Oncology | Admitting: Oncology

## 2014-10-11 DIAGNOSIS — C50411 Malignant neoplasm of upper-outer quadrant of right female breast: Secondary | ICD-10-CM

## 2014-10-11 MED ORDER — GADOBENATE DIMEGLUMINE 529 MG/ML IV SOLN
15.0000 mL | Freq: Once | INTRAVENOUS | Status: AC | PRN
  Administered 2014-10-11: 15 mL via INTRAVENOUS

## 2014-10-11 NOTE — Progress Notes (Signed)
[place]  Telephone:(336) [phone] Fax:(336) [phone]     ID: Darlene Kelly DOB: [birth year]  MR#: [id number]  GMW#:[id number]  Patient Care Team: [name], MD as PCP - General (Family Medicine) [name], MD as Consulting Physician (General Surgery) [name], MD as Consulting Physician (Oncology) [name], MD as Consulting Physician (Radiation Oncology) [name], RN as Registered Nurse [name], RN as Registered Nurse [name], MD as Referring Physician (Cardiology) [name], MD as Referring Physician (Dermatology) [name], NP as Nurse Practitioner (Nurse Practitioner) PCP: [name], MD OTHER MD:  CHIEF COMPLAINT: Estrogen receptor positive breast cancer  CURRENT TREATMENT: Neoadjuvant chemotherapy   BREAST CANCER HISTORY: From the original intake note:  Darlene Kelly first noted a change in her right breast September 2015. At that time however she was consumed by the care of her mother-in-law, who had severe Alzheimer's disease. She finally passed in January 2016, but Darlene Kelly did not share the information regarding the right breast with her husband until late March 2016. At that point she was set up for bilateral diagnostic mammography with tomography at the breast Center, for 11/20/2014. This found the breast density to be category B. In the upper outer quadrant of the right breast there was a 4 cm irregular spiculated mass. There were no other findings of concern. The mass was palpable and firm, and by ultrasound measured 3.3 cm. The right axilla was negative sonographically.  Biopsy of the right breast mass in question was obtained for 03/22/2015. 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  On 07/20/2014 the patient underwent bilateral  breast MRI. This measured the large irregular enhancing mass in the right breast at the 10:00 position at 5.0 cm. There were no additional worrisome findings in the right breast, and no findings in the left breast or any abnormal appearing lymph nodes.  The patient's subsequent history is as detailed below  INTERVAL HISTORY: Darlene Kelly returns today for follow up of her breast cancer. Today was going to be day 1 cycle 1 of 12 planned weekly doses of paclitaxel. However she tells me she has been having a cough for about 2 weeks, and a sore throat. She occasionally brings up a little phlegm. She has had no fever, worsening shortness of breath, pleurisy or other symptoms. In fact she generally feels well except for this upper respiratory problem. She just returned from a trip to [facility] where however she mostly stated in the house because he was very hot and because of the cough problem.  REVIEW OF SYSTEMS: Darlene Kelly is trying to take little walks during the day as her chief form of exercise. A detailed review of systems today was otherwise stable  PAST MEDICAL HISTORY: Past Medical History  Diagnosis Date  . Breast cancer of upper-outer quadrant of right female breast 07/16/2014  . Diabetes mellitus without complication   . Hypertension   . Skin cancer   . Heart murmur     PAST SURGICAL HISTORY: Past Surgical History  Procedure Laterality Date  . Portacath placement N/A 08/03/2014    Procedure: INSERTION PORT-A-CATH;  Surgeon: [name], MD;  Location: [facility];  Service: General;  Laterality: N/A;    FAMILY HISTORY Family History  Problem Relation Age of Onset  . Lung cancer Father    the patient's father died from lung  cancer in the setting of tobacco abuse at the age of 51. The patient's mother died from COPD at the age of 44. The patient has 3 brothers, no sisters. There is no history of breast or ovarian cancer in the family.  GYNECOLOGIC HISTORY:  No LMP recorded. Patient is  postmenopausal. Menarche age 67, first live birth age 55. She is GX P3. She went through the change of life in her late 18s. She did not take hormone replacement.  SOCIAL HISTORY:  Darlene Kelly worked for [employer], but is now retired. Her husband [name] is Programmer, systems for a [employer]. They have been married more than 40 years. Son [name] "[name] was "'s Caylin Nass is a climbing Acupuncturist in [place]. Son [name] "map" Jessly Lebeck is a Dealer in [place]. Daughter Jeanetta Alonzo is a branch bank or for [employer] in [place]. The patient has 3 grandchildren.    ADVANCED DIRECTIVES: In place   HEALTH MAINTENANCE: History  Substance Use Topics  . Smoking status: Former Smoker -- 0.50 packs/day for 12 years    Types: Cigarettes    Quit date: 04/02/1986  . Smokeless tobacco: Never Used  . Alcohol Use: 1.8 oz/week    3 Glasses of wine per week     Colonoscopy: Never  PAP:  Bone density: Never  Lipid panel:  Allergies  Allergen Reactions  . Latex Itching, Dermatitis and Rash  . Lipitor [Atorvastatin] Other (See Comments)    Bad leg cramps    Current Outpatient Prescriptions  Medication Sig Dispense Refill  . aspirin EC 81 MG tablet Take 81 mg by mouth at bedtime.     . cetirizine (ZYRTEC) 10 MG tablet Take 10 mg by mouth daily as needed for allergies.    Marland Kitchen HYDROcodone-homatropine (HYCODAN) 5-1.5 MG/5ML syrup Take 5 mLs by mouth every 6 (six) hours as needed for cough. 120 mL 0  . ibuprofen (ADVIL,MOTRIN) 200 MG tablet Take 400 mg by mouth every 6 (six) hours as needed for headache or moderate pain.    Marland Kitchen lidocaine-prilocaine (EMLA) cream Apply topically as needed. Use as directed on port site 1 hour prior to IV access 1 each 3  . lisinopril (PRINIVIL,ZESTRIL) 10 MG tablet Take 10 mg by mouth at bedtime.   6  . MELATONIN PO Take 5 mg by mouth at bedtime.     . metFORMIN (GLUCOPHAGE) 1000 MG tablet Take 1,000 mg by mouth 2 (two) times daily with a  meal.    . metoprolol succinate (TOPROL-XL) 100 MG 24 hr tablet Take 100 mg by mouth every evening.     . Omega-3 Fatty Acids (FISH OIL PO) Take 1 capsule by mouth every evening.     . prochlorperazine (COMPAZINE) 10 MG tablet Take 1 tablet (10 mg total) by mouth every 6 (six) hours as needed (Nausea or vomiting). 30 tablet 1  . rosuvastatin (CRESTOR) 40 MG tablet Take by mouth daily. Pt takes 1/2 tablet(13m) on Mondays, Wednesdays, Fridays.     No current facility-administered medications for this visit.    OBJECTIVE: Middle-aged white woman who appears stated age F41Vitals:   10/12/14 0845  BP: 128/56  Pulse: 90  Temp: 98.7 F (37.1 C)  Resp: 18     Body mass index is 28.54 kg/(m^2).    ECOG FS:1 - Symptomatic but completely ambulatory  Sclerae unicteric, pupils round and equal Oropharynx clear and moist-- no thrush or other lesions No cervical or supraclavicular adenopathy Lungs no  rales or rhonchi Heart regular rate and rhythm, 2/6 systolic murmur as previously noted Abd soft, nontender, positive bowel sounds MSK no focal spinal tenderness, no upper extremity lymphedema Neuro: nonfocal, well oriented, appropriate affect Breasts: The right breast mass is harder to palpate, although still can be localized. It seems a bit softer and considerably smaller. There are no skin or nipple changes of concern. The right axilla is benign. Left breast is unremarkable.   LAB RESULTS:  CMP     Component Value Date/Time   NA 140 10/12/2014 0834   K 3.9 10/12/2014 0834   CO2 25 10/12/2014 0834   GLUCOSE 215* 10/12/2014 0834   BUN 6.4* 10/12/2014 0834   CREATININE 0.7 10/12/2014 0834   CALCIUM 9.0 10/12/2014 0834   PROT 5.9* 10/12/2014 0834   ALBUMIN 3.3* 10/12/2014 0834   AST 15 10/12/2014 0834   ALT 14 10/12/2014 0834   ALKPHOS 93 10/12/2014 0834   BILITOT 0.50 10/12/2014 0834    INo results found for: SPEP, UPEP  Lab Results  Component Value Date   WBC 10.7* 10/12/2014     NEUTROABS 9.4* 10/12/2014   HGB 9.7* 10/12/2014   HCT 28.2* 10/12/2014   MCV 86.1 10/12/2014   PLT 186 10/12/2014      Chemistry      Component Value Date/Time   NA 140 10/12/2014 0834   K 3.9 10/12/2014 0834   CO2 25 10/12/2014 0834   BUN 6.4* 10/12/2014 0834   CREATININE 0.7 10/12/2014 0834      Component Value Date/Time   CALCIUM 9.0 10/12/2014 0834   ALKPHOS 93 10/12/2014 0834   AST 15 10/12/2014 0834   ALT 14 10/12/2014 0834   BILITOT 0.50 10/12/2014 0834       No results found for: LABCA2  No components found for: [id number]  No results for input(s): INR in the last 168 hours.  Urinalysis No results found for: COLORURINE, APPEARANCEUR, LABSPEC, [place], GLUCOSEU, [place], [place], [place], PROTEINUR, UROBILINOGEN, NITRITE, LEUKOCYTESUR  STUDIES: Mr Breast Bilateral W Wo Contrast  10/11/2014   CLINICAL DATA:  Right breast invasive mammary carcinoma diagnosed in April 2016. The patient is undergoing neoadjuvant chemotherapy.  LABS:  None  EXAM: BILATERAL BREAST MRI WITH AND WITHOUT CONTRAST  TECHNIQUE: Multiplanar, multisequence MR images of both breasts were obtained prior to and following the intravenous administration of 15 ml of MultiHance.  THREE-DIMENSIONAL MR IMAGE RENDERING ON INDEPENDENT WORKSTATION:  Three-dimensional MR images were rendered by post-processing of the original MR data on an independent workstation. The three-dimensional MR images were interpreted, and findings are reported in the following complete MRI report for this study. Three dimensional images were evaluated at the independent DynaCad workstation  COMPARISON:  MRI of the breast 07/20/2014  FINDINGS: Breast composition: b. Scattered fibroglandular tissue.  Background parenchymal enhancement: Mild.  Right breast: The previously demonstrated right breast malignancy at 11 o'clock, middle to posterior depth, has decreased in size and measures 2.6 by 2.7 by 2.7 cm. It demonstrates mostly  peripheral mixed progressive and rapid wash-in and washout enhancement, and probable central necrosis with most of the viable tumor located superiorly and laterally.  Left breast: No mass or abnormal enhancement.  Lymph nodes: No abnormal appearing lymph nodes.  Ancillary findings:  None.  IMPRESSION: Significant response to chemotherapy with decreased in the size of the necrotic right breast mass.  No evidence of lymphadenopathy by MRI.  RECOMMENDATION: Continued medical and surgical follow-up per treatment plan.  BI-RADS CATEGORY  6: Known biopsy-proven  malignancy.   Electronically Signed   By: Fidela Salisbury M.D.   On: 10/11/2014 14:35    ASSESSMENT: 64 y.o. [place] woman status post right breast upper outer quadrant biopsy 07/13/2014, for a clinical T3 N0, stage IIB invasive ductal carcinoma, grade 3, estrogen receptor positive, HER-2 and progesterone receptor negative, with an MIB-1 of 40%  (1) neoadjuvant chemotherapy starting 08/15/2013, consisting of doxorubicin and cyclophosphamide in dose dense fashion 4, completed 09/28/2014, followed by weekly paclitaxel 12 starting 10/19/2014  (2) breast conserving surgery with sentinel lymph node sampling to follow chemotherapy  (3) adjuvant radiation to follow surgery  (4) anti-estrogens to follow radiation  PLAN: Dalary has had a good response to the first part of her chemotherapy. We reviewed the images and she was very excited to see the significant reduction in the measurable mass. We are now ready to start part 2. Today we discussed the possible toxicities, side effects and complications of paclitaxel.  She does not need dexamethasone for nausea with this medication, and of course she will not need Neulasta either. She will use prochlorperazine for nausea as needed and Zofran as backup.  However I think we may be wise to wait a week. Although she generally feels well, she has a significant cough and sore throat. She is also moderately  anemic. I think if we start treatment now she may end up having treatment interruptions and a better strategy in my opinion is to wait a week, let her recover, and then start day 1 and a week from today.  She is agreeable to this. She will continue to take Zyrtec, antitussives, and drink lots of hot liquids. She will try to resume an exercise program. She knows to call for any problems that may develop before her next visit here.  [name], MD   10/12/2014 9:34 AM

## 2014-10-12 ENCOUNTER — Ambulatory Visit (HOSPITAL_BASED_OUTPATIENT_CLINIC_OR_DEPARTMENT_OTHER): Payer: 59 | Admitting: Oncology

## 2014-10-12 ENCOUNTER — Other Ambulatory Visit (HOSPITAL_BASED_OUTPATIENT_CLINIC_OR_DEPARTMENT_OTHER): Payer: 59

## 2014-10-12 ENCOUNTER — Ambulatory Visit: Payer: 59

## 2014-10-12 VITALS — BP 128/56 | HR 90 | Temp 98.7°F | Resp 18 | Ht 63.0 in | Wt 161.1 lb

## 2014-10-12 DIAGNOSIS — D649 Anemia, unspecified: Secondary | ICD-10-CM

## 2014-10-12 DIAGNOSIS — C50411 Malignant neoplasm of upper-outer quadrant of right female breast: Secondary | ICD-10-CM | POA: Diagnosis not present

## 2014-10-12 DIAGNOSIS — E119 Type 2 diabetes mellitus without complications: Secondary | ICD-10-CM

## 2014-10-12 DIAGNOSIS — Z170 Estrogen receptor positive status [ER+]: Secondary | ICD-10-CM | POA: Diagnosis not present

## 2014-10-12 LAB — CBC WITH DIFFERENTIAL/PLATELET
BASO%: 0.2 % (ref 0.0–2.0)
Basophils Absolute: 0 10*3/uL (ref 0.0–0.1)
EOS ABS: 0 10*3/uL (ref 0.0–0.5)
EOS%: 0 % (ref 0.0–7.0)
HCT: 28.2 % — ABNORMAL LOW (ref 34.8–46.6)
HGB: 9.7 g/dL — ABNORMAL LOW (ref 11.6–15.9)
LYMPH#: 0.5 10*3/uL — AB (ref 0.9–3.3)
LYMPH%: 4.2 % — ABNORMAL LOW (ref 14.0–49.7)
MCH: 29.7 pg (ref 25.1–34.0)
MCHC: 34.5 g/dL (ref 31.5–36.0)
MCV: 86.1 fL (ref 79.5–101.0)
MONO#: 0.8 10*3/uL (ref 0.1–0.9)
MONO%: 7.7 % (ref 0.0–14.0)
NEUT#: 9.4 10*3/uL — ABNORMAL HIGH (ref 1.5–6.5)
NEUT%: 87.9 % — ABNORMAL HIGH (ref 38.4–76.8)
Platelets: 186 10*3/uL (ref 145–400)
RBC: 3.27 10*6/uL — ABNORMAL LOW (ref 3.70–5.45)
RDW: 24 % — ABNORMAL HIGH (ref 11.2–14.5)
WBC: 10.7 10*3/uL — ABNORMAL HIGH (ref 3.9–10.3)

## 2014-10-12 LAB — COMPREHENSIVE METABOLIC PANEL (CC13)
ALT: 14 U/L (ref 0–55)
ANION GAP: 12 meq/L — AB (ref 3–11)
AST: 15 U/L (ref 5–34)
Albumin: 3.3 g/dL — ABNORMAL LOW (ref 3.5–5.0)
Alkaline Phosphatase: 93 U/L (ref 40–150)
BILIRUBIN TOTAL: 0.5 mg/dL (ref 0.20–1.20)
BUN: 6.4 mg/dL — ABNORMAL LOW (ref 7.0–26.0)
CO2: 25 meq/L (ref 22–29)
Calcium: 9 mg/dL (ref 8.4–10.4)
Chloride: 104 mEq/L (ref 98–109)
Creatinine: 0.7 mg/dL (ref 0.6–1.1)
EGFR: 87 mL/min/{1.73_m2} — ABNORMAL LOW (ref >90)
Glucose: 215 mg/dl — ABNORMAL HIGH (ref 70–140)
Potassium: 3.9 mEq/L (ref 3.5–5.1)
Sodium: 140 mEq/L (ref 136–145)
Total Protein: 5.9 g/dL — ABNORMAL LOW (ref 6.4–8.3)

## 2014-10-19 ENCOUNTER — Encounter: Payer: Self-pay | Admitting: Nurse Practitioner

## 2014-10-19 ENCOUNTER — Other Ambulatory Visit (HOSPITAL_BASED_OUTPATIENT_CLINIC_OR_DEPARTMENT_OTHER): Payer: 59

## 2014-10-19 ENCOUNTER — Ambulatory Visit: Payer: 59

## 2014-10-19 ENCOUNTER — Other Ambulatory Visit: Payer: 59

## 2014-10-19 ENCOUNTER — Other Ambulatory Visit: Payer: Self-pay | Admitting: Nurse Practitioner

## 2014-10-19 ENCOUNTER — Encounter: Payer: Self-pay | Admitting: *Deleted

## 2014-10-19 ENCOUNTER — Ambulatory Visit (HOSPITAL_BASED_OUTPATIENT_CLINIC_OR_DEPARTMENT_OTHER): Payer: 59 | Admitting: Nurse Practitioner

## 2014-10-19 ENCOUNTER — Ambulatory Visit (HOSPITAL_BASED_OUTPATIENT_CLINIC_OR_DEPARTMENT_OTHER): Payer: 59

## 2014-10-19 VITALS — BP 141/72 | HR 81 | Temp 98.7°F | Resp 16

## 2014-10-19 VITALS — BP 115/70 | HR 95 | Temp 99.4°F | Resp 18 | Ht 63.0 in | Wt 159.0 lb

## 2014-10-19 DIAGNOSIS — Z170 Estrogen receptor positive status [ER+]: Secondary | ICD-10-CM

## 2014-10-19 DIAGNOSIS — Z5111 Encounter for antineoplastic chemotherapy: Secondary | ICD-10-CM

## 2014-10-19 DIAGNOSIS — C50411 Malignant neoplasm of upper-outer quadrant of right female breast: Secondary | ICD-10-CM | POA: Diagnosis not present

## 2014-10-19 LAB — CBC WITH DIFFERENTIAL/PLATELET
BASO%: 0.5 % (ref 0.0–2.0)
Basophils Absolute: 0.1 10*3/uL (ref 0.0–0.1)
EOS ABS: 0 10*3/uL (ref 0.0–0.5)
EOS%: 0 % (ref 0.0–7.0)
HEMATOCRIT: 31.3 % — AB (ref 34.8–46.6)
HGB: 10.5 g/dL — ABNORMAL LOW (ref 11.6–15.9)
LYMPH%: 7.3 % — AB (ref 14.0–49.7)
MCH: 30 pg (ref 25.1–34.0)
MCHC: 33.5 g/dL (ref 31.5–36.0)
MCV: 89.5 fL (ref 79.5–101.0)
MONO#: 1.2 10*3/uL — AB (ref 0.1–0.9)
MONO%: 12.3 % (ref 0.0–14.0)
NEUT#: 8 10*3/uL — ABNORMAL HIGH (ref 1.5–6.5)
NEUT%: 79.9 % — ABNORMAL HIGH (ref 38.4–76.8)
Platelets: 260 10*3/uL (ref 145–400)
RBC: 3.5 10*6/uL — ABNORMAL LOW (ref 3.70–5.45)
RDW: 24.6 % — ABNORMAL HIGH (ref 11.2–14.5)
WBC: 10 10*3/uL (ref 3.9–10.3)
lymph#: 0.7 10*3/uL — ABNORMAL LOW (ref 0.9–3.3)

## 2014-10-19 MED ORDER — HEPARIN SOD (PORK) LOCK FLUSH 100 UNIT/ML IV SOLN
500.0000 [IU] | Freq: Once | INTRAVENOUS | Status: AC | PRN
  Administered 2014-10-19: 500 [IU]
  Filled 2014-10-19: qty 5

## 2014-10-19 MED ORDER — DEXAMETHASONE SODIUM PHOSPHATE 100 MG/10ML IJ SOLN
Freq: Once | INTRAMUSCULAR | Status: AC
  Administered 2014-10-19: 15:00:00 via INTRAVENOUS
  Filled 2014-10-19: qty 4

## 2014-10-19 MED ORDER — SODIUM CHLORIDE 0.9 % IJ SOLN
10.0000 mL | INTRAMUSCULAR | Status: DC | PRN
  Administered 2014-10-19: 10 mL
  Filled 2014-10-19: qty 10

## 2014-10-19 MED ORDER — FAMOTIDINE IN NACL 20-0.9 MG/50ML-% IV SOLN
INTRAVENOUS | Status: AC
  Filled 2014-10-19: qty 50

## 2014-10-19 MED ORDER — PACLITAXEL CHEMO INJECTION 300 MG/50ML
80.0000 mg/m2 | Freq: Once | INTRAVENOUS | Status: AC
  Administered 2014-10-19: 144 mg via INTRAVENOUS
  Filled 2014-10-19: qty 24

## 2014-10-19 MED ORDER — FAMOTIDINE IN NACL 20-0.9 MG/50ML-% IV SOLN
20.0000 mg | Freq: Once | INTRAVENOUS | Status: AC
  Administered 2014-10-19: 20 mg via INTRAVENOUS

## 2014-10-19 MED ORDER — DIPHENHYDRAMINE HCL 50 MG/ML IJ SOLN
25.0000 mg | Freq: Once | INTRAMUSCULAR | Status: AC
  Administered 2014-10-19: 25 mg via INTRAVENOUS

## 2014-10-19 MED ORDER — SODIUM CHLORIDE 0.9 % IV SOLN
Freq: Once | INTRAVENOUS | Status: AC
  Administered 2014-10-19: 14:00:00 via INTRAVENOUS

## 2014-10-19 MED ORDER — DIPHENHYDRAMINE HCL 50 MG/ML IJ SOLN
INTRAMUSCULAR | Status: AC
  Filled 2014-10-19: qty 1

## 2014-10-19 NOTE — Progress Notes (Signed)
No C-met needed today, use the one done last week for today's treatment per Val per Dr. Jana Hakim.

## 2014-10-19 NOTE — Progress Notes (Signed)
[facility]  [phone]) [phone] [phone]     ID: Darlene Kelly DOB: [birth year]  MR#: [id number]  ZHY#:[id number]  Patient Care Team: [name], MD as PCP - General (Family Medicine) [name], MD as Consulting Physician (General Surgery) [name], MD as Consulting Physician (Oncology) [name], MD as Consulting Physician (Radiation Oncology) [name], RN as Registered Nurse [name], RN as Registered Nurse [name], MD as Referring Physician (Cardiology) [name], MD as Referring Physician (Dermatology) [name], NP as Nurse Practitioner (Nurse Practitioner) PCP: [name], MD OTHER MD:  CHIEF COMPLAINT: Estrogen receptor positive breast cancer  CURRENT TREATMENT: Neoadjuvant chemotherapy  BREAST CANCER HISTORY: From the original intake note:  [name] first noted a change in her right breast September 2015. At that time however she was consumed by the care of her mother-in-law, who had severe Alzheimer's disease. She finally passed in January 2016, but [name] did not share the information regarding the right breast with her husband until late March 2016. At that point she was set up for bilateral diagnostic mammography with tomography at the breast Center, for 11/20/2014. This found the breast density to be category B. In the upper outer quadrant of the right breast there was a 4 cm irregular spiculated mass. There were no other findings of concern. The mass was palpable and firm, and by ultrasound measured 3.3 cm. The right axilla was negative sonographically.  Biopsy of the right breast mass in question was obtained for 03/22/2015. 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  On 07/20/2014 the patient underwent bilateral breast  MRI. This measured the large irregular enhancing mass in the right breast at the 10:00 position at 5.0 cm. There were no additional worrisome findings in the right breast, and no findings in the left breast or any abnormal appearing lymph nodes.  The patient's subsequent history is as detailed below  INTERVAL HISTORY: [name] returns today for follow up of her breast cancer. Today she is due to start cycle 1 of 12 planned weekly cycles of paclitaxel. This was held from occurring last week because of an upper respiratory infection. She is feeling better this week. She is hardly coughing, but is still somewhat congested. She denies fevers or chills. She states she feels "excellent."  REVIEW OF SYSTEMS: A detailed review of systems is otherwise entirely negative, except where noted above.   PAST MEDICAL HISTORY: Past Medical History  Diagnosis Date  . Breast cancer of upper-outer quadrant of right female breast 07/16/2014  . Diabetes mellitus without complication   . Hypertension   . Skin cancer   . Heart murmur     PAST SURGICAL HISTORY: Past Surgical History  Procedure Laterality Date  . Portacath placement N/A 08/03/2014    Procedure: INSERTION PORT-A-CATH;  Surgeon: [name], MD;  Location: [facility];  Service: General;  Laterality: N/A;    FAMILY HISTORY Family History  Problem Relation Age of Onset  . Lung cancer Father    the patient's father died from lung cancer in the setting of tobacco abuse at the age of 12. The patient's mother died from COPD at the age of 50. The patient has 3 brothers, no sisters. There is no history of breast or ovarian cancer in the family.  GYNECOLOGIC HISTORY:  No LMP recorded. Patient is postmenopausal. Menarche  age 52, first live birth age 63. She is GX P3. She went through the change of life in her late 69s. She did not take hormone replacement.  SOCIAL HISTORY:  Taronda worked for [employer], but is now retired. Her husband [name] is  Programmer, systems for a [employer]. They have been married more than 40 years. Son [name] "[name] was "'s [name] is a climbing Acupuncturist in [place]. Son [name] "map" Yaniris Braddock is a Dealer in [place]. Daughter [name] is a branch bank or for [employer] in [place]. The patient has 3 grandchildren.    ADVANCED DIRECTIVES: In place   HEALTH MAINTENANCE: History  Substance Use Topics  . Smoking status: Former Smoker -- 0.50 packs/day for 12 years    Types: Cigarettes    Quit date: 04/02/1986  . Smokeless tobacco: Never Used  . Alcohol Use: 1.8 oz/week    3 Glasses of wine per week     Colonoscopy: Never  PAP:  Bone density: Never  Lipid panel:  Allergies  Allergen Reactions  . Latex Itching, Dermatitis and Rash  . Lipitor [Atorvastatin] Other (See Comments)    Bad leg cramps    Current Outpatient Prescriptions  Medication Sig Dispense Refill  . aspirin EC 81 MG tablet Take 81 mg by mouth at bedtime.     . cetirizine (ZYRTEC) 10 MG tablet Take 10 mg by mouth daily as needed for allergies.    Marland Kitchen HYDROcodone-homatropine (HYCODAN) 5-1.5 MG/5ML syrup Take 5 mLs by mouth every 6 (six) hours as needed for cough. 120 mL 0  . ibuprofen (ADVIL,MOTRIN) 200 MG tablet Take 400 mg by mouth every 6 (six) hours as needed for headache or moderate pain.    Marland Kitchen lidocaine-prilocaine (EMLA) cream Apply topically as needed. Use as directed on port site 1 hour prior to IV access 1 each 3  . lisinopril (PRINIVIL,ZESTRIL) 10 MG tablet Take 10 mg by mouth at bedtime.   6  . MELATONIN PO Take 5 mg by mouth at bedtime.     . metFORMIN (GLUCOPHAGE) 1000 MG tablet Take 1,000 mg by mouth 2 (two) times daily with a meal.    . metoprolol succinate (TOPROL-XL) 100 MG 24 hr tablet Take 100 mg by mouth every evening.     . Omega-3 Fatty Acids (FISH OIL PO) Take 1 capsule by mouth every evening.     . prochlorperazine (COMPAZINE) 10 MG tablet Take 1 tablet (10 mg total) by  mouth every 6 (six) hours as needed (Nausea or vomiting). 30 tablet 1  . rosuvastatin (CRESTOR) 40 MG tablet Take by mouth daily. Pt takes 1/2 tablet(65m) on Mondays, Wednesdays, Fridays.     No current facility-administered medications for this visit.    OBJECTIVE: Middle-aged white woman who appears stated age F70Vitals:   10/19/14 1319  BP: 115/70  Pulse: 95  Temp: 99.4 F (37.4 C)  Resp: 18     Body mass index is 28.17 kg/(m^2).    ECOG FS:1 - Symptomatic but completely ambulatory   Skin: warm, dry  HEENT: sclerae anicteric, conjunctivae pink, oropharynx clear. No thrush or mucositis.  Lymph Nodes: No cervical or supraclavicular lymphadenopathy  Lungs: clear to auscultation bilaterally, no rales, wheezes, or rhonci  Heart: regular rate and rhythm  Abdomen: round, soft, non tender, positive bowel sounds  Musculoskeletal: No focal spinal tenderness, no peripheral edema  Neuro: non focal, well oriented, positive affect  Breasts: deferred  LAB RESULTS:  CMP  Component Value Date/Time   NA 140 10/12/2014 0834   K 3.9 10/12/2014 0834   CO2 25 10/12/2014 0834   GLUCOSE 215* 10/12/2014 0834   BUN 6.4* 10/12/2014 0834   CREATININE 0.7 10/12/2014 0834   CALCIUM 9.0 10/12/2014 0834   PROT 5.9* 10/12/2014 0834   ALBUMIN 3.3* 10/12/2014 0834   AST 15 10/12/2014 0834   ALT 14 10/12/2014 0834   ALKPHOS 93 10/12/2014 0834   BILITOT 0.50 10/12/2014 0834    INo results found for: SPEP, UPEP  Lab Results  Component Value Date   WBC 10.0 10/19/2014   NEUTROABS 8.0* 10/19/2014   HGB 10.5* 10/19/2014   HCT 31.3* 10/19/2014   MCV 89.5 10/19/2014   PLT 260 10/19/2014      Chemistry      Component Value Date/Time   NA 140 10/12/2014 0834   K 3.9 10/12/2014 0834   CO2 25 10/12/2014 0834   BUN 6.4* 10/12/2014 0834   CREATININE 0.7 10/12/2014 0834      Component Value Date/Time   CALCIUM 9.0 10/12/2014 0834   ALKPHOS 93 10/12/2014 0834   AST 15 10/12/2014 0834    ALT 14 10/12/2014 0834   BILITOT 0.50 10/12/2014 0834       No results found for: LABCA2  No components found for: [id number]  No results for input(s): INR in the last 168 hours.  Urinalysis No results found for: COLORURINE, APPEARANCEUR, LABSPEC, [place], GLUCOSEU, [place], [place], [place], PROTEINUR, UROBILINOGEN, NITRITE, LEUKOCYTESUR  STUDIES: Mr Breast Bilateral W Wo Contrast  10/11/2014   CLINICAL DATA:  Right breast invasive mammary carcinoma diagnosed in April 2016. The patient is undergoing neoadjuvant chemotherapy.  LABS:  None  EXAM: BILATERAL BREAST MRI WITH AND WITHOUT CONTRAST  TECHNIQUE: Multiplanar, multisequence MR images of both breasts were obtained prior to and following the intravenous administration of 15 ml of MultiHance.  THREE-DIMENSIONAL MR IMAGE RENDERING ON INDEPENDENT WORKSTATION:  Three-dimensional MR images were rendered by post-processing of the original MR data on an independent workstation. The three-dimensional MR images were interpreted, and findings are reported in the following complete MRI report for this study. Three dimensional images were evaluated at the independent DynaCad workstation  COMPARISON:  MRI of the breast 07/20/2014  FINDINGS: Breast composition: b. Scattered fibroglandular tissue.  Background parenchymal enhancement: Mild.  Right breast: The previously demonstrated right breast malignancy at 11 o'clock, middle to posterior depth, has decreased in size and measures 2.6 by 2.7 by 2.7 cm. It demonstrates mostly peripheral mixed progressive and rapid wash-in and washout enhancement, and probable central necrosis with most of the viable tumor located superiorly and laterally.  Left breast: No mass or abnormal enhancement.  Lymph nodes: No abnormal appearing lymph nodes.  Ancillary findings:  None.  IMPRESSION: Significant response to chemotherapy with decreased in the size of the necrotic right breast mass.  No evidence of lymphadenopathy by MRI.   RECOMMENDATION: Continued medical and surgical follow-up per treatment plan.  BI-RADS CATEGORY  6: Known biopsy-proven malignancy.   Electronically Signed   By: Fidela Salisbury M.D.   On: 10/11/2014 14:35    ASSESSMENT: 64 y.o. [place] woman status post right breast upper outer quadrant biopsy 07/13/2014, for a clinical T3 N0, stage IIB invasive ductal carcinoma, grade 3, estrogen receptor positive, HER-2 and progesterone receptor negative, with an MIB-1 of 40%  (1) neoadjuvant chemotherapy starting 08/15/2013, consisting of doxorubicin and cyclophosphamide in dose dense fashion 4, completed 09/28/2014, followed by weekly paclitaxel 12 starting 10/19/2014  (2)  breast conserving surgery with sentinel lymph node sampling to follow chemotherapy  (3) adjuvant radiation to follow surgery  (4) anti-estrogens to follow radiation  PLAN:  The labs were reviewed in detail and are stable. With the extra week off, her her hgb has risen to 10.5. Kiaya feeling better today, so we will go ahead and start cycle 1 of paclitaxel today. We reviewed the removal of dexamethasone and neulasta from her treatment plan. She will use compazine and zofran alone for her nausea.  Zo will return in 1 week for cycle 2 of treatment. She understands and agrees with this plan. She knows the goal of treatment in her case is cure. She has been encouraged to call with any issues that might arise before her next visit here.   Laurie Panda, NP   10/19/2014 1:36 PM

## 2014-10-19 NOTE — Patient Instructions (Signed)
[facility] Discharge Instructions for Patients Receiving Chemotherapy  Today you received the following chemotherapy agents: Taxol.   To help prevent nausea and vomiting after your treatment, we encourage you to take your nausea medication: Zofran. Take one every 8 hours as needed.    If you develop nausea and vomiting that is not controlled by your nausea medication, call the clinic.   BELOW ARE SYMPTOMS THAT SHOULD BE REPORTED IMMEDIATELY:  *FEVER GREATER THAN 100.5 F  *CHILLS WITH OR WITHOUT FEVER  NAUSEA AND VOMITING THAT IS NOT CONTROLLED WITH YOUR NAUSEA MEDICATION  *UNUSUAL SHORTNESS OF BREATH  *UNUSUAL BRUISING OR BLEEDING  TENDERNESS IN MOUTH AND THROAT WITH OR WITHOUT PRESENCE OF ULCERS  *URINARY PROBLEMS  *BOWEL PROBLEMS  UNUSUAL RASH Items with * indicate a potential emergency and should be followed up as soon as possible.  Feel free to call the clinic should you have any questions or concerns. The clinic phone number is [phone].  Please show the CHEMO ALERT CARD at check-in to the Emergency Department and triage nurse.   

## 2014-10-26 ENCOUNTER — Encounter: Payer: Self-pay | Admitting: *Deleted

## 2014-10-26 ENCOUNTER — Other Ambulatory Visit (HOSPITAL_BASED_OUTPATIENT_CLINIC_OR_DEPARTMENT_OTHER): Payer: 59

## 2014-10-26 ENCOUNTER — Ambulatory Visit (HOSPITAL_BASED_OUTPATIENT_CLINIC_OR_DEPARTMENT_OTHER): Payer: 59 | Admitting: Nurse Practitioner

## 2014-10-26 ENCOUNTER — Ambulatory Visit: Payer: 59

## 2014-10-26 ENCOUNTER — Other Ambulatory Visit: Payer: 59

## 2014-10-26 ENCOUNTER — Ambulatory Visit (HOSPITAL_BASED_OUTPATIENT_CLINIC_OR_DEPARTMENT_OTHER): Payer: 59

## 2014-10-26 ENCOUNTER — Encounter: Payer: Self-pay | Admitting: Nurse Practitioner

## 2014-10-26 VITALS — BP 112/61 | HR 90 | Temp 99.2°F | Resp 18 | Ht 63.0 in | Wt 157.8 lb

## 2014-10-26 DIAGNOSIS — C50411 Malignant neoplasm of upper-outer quadrant of right female breast: Secondary | ICD-10-CM

## 2014-10-26 DIAGNOSIS — Z5111 Encounter for antineoplastic chemotherapy: Secondary | ICD-10-CM

## 2014-10-26 DIAGNOSIS — Z170 Estrogen receptor positive status [ER+]: Secondary | ICD-10-CM | POA: Diagnosis not present

## 2014-10-26 LAB — COMPREHENSIVE METABOLIC PANEL (CC13)
ALBUMIN: 3.3 g/dL — AB (ref 3.5–5.0)
ALK PHOS: 60 U/L (ref 40–150)
ALT: 14 U/L (ref 0–55)
AST: 17 U/L (ref 5–34)
Anion Gap: 9 mEq/L (ref 3–11)
BILIRUBIN TOTAL: 0.53 mg/dL (ref 0.20–1.20)
BUN: 13.1 mg/dL (ref 7.0–26.0)
CO2: 25 mEq/L (ref 22–29)
Calcium: 9.2 mg/dL (ref 8.4–10.4)
Chloride: 107 mEq/L (ref 98–109)
Creatinine: 0.7 mg/dL (ref 0.6–1.1)
EGFR: 89 mL/min/{1.73_m2} — AB (ref >90)
GLUCOSE: 171 mg/dL — AB (ref 70–140)
Potassium: 3.6 mEq/L (ref 3.5–5.1)
Sodium: 142 mEq/L (ref 136–145)
Total Protein: 6.2 g/dL — ABNORMAL LOW (ref 6.4–8.3)

## 2014-10-26 LAB — CBC WITH DIFFERENTIAL/PLATELET
BASO%: 0.6 % (ref 0.0–2.0)
Basophils Absolute: 0 10*3/uL (ref 0.0–0.1)
EOS%: 0.2 % (ref 0.0–7.0)
Eosinophils Absolute: 0 10*3/uL (ref 0.0–0.5)
HCT: 30.6 % — ABNORMAL LOW (ref 34.8–46.6)
HEMOGLOBIN: 10.2 g/dL — AB (ref 11.6–15.9)
LYMPH%: 9.4 % — AB (ref 14.0–49.7)
MCH: 30.9 pg (ref 25.1–34.0)
MCHC: 33.3 g/dL (ref 31.5–36.0)
MCV: 92.7 fL (ref 79.5–101.0)
MONO#: 0.7 10*3/uL (ref 0.1–0.9)
MONO%: 10.3 % (ref 0.0–14.0)
NEUT#: 5 10*3/uL (ref 1.5–6.5)
NEUT%: 79.5 % — AB (ref 38.4–76.8)
Platelets: 256 10*3/uL (ref 145–400)
RBC: 3.3 10*6/uL — AB (ref 3.70–5.45)
RDW: 21.1 % — ABNORMAL HIGH (ref 11.2–14.5)
WBC: 6.3 10*3/uL (ref 3.9–10.3)
lymph#: 0.6 10*3/uL — ABNORMAL LOW (ref 0.9–3.3)

## 2014-10-26 MED ORDER — SODIUM CHLORIDE 0.9 % IJ SOLN
10.0000 mL | INTRAMUSCULAR | Status: DC | PRN
  Administered 2014-10-26: 10 mL
  Filled 2014-10-26: qty 10

## 2014-10-26 MED ORDER — HEPARIN SOD (PORK) LOCK FLUSH 100 UNIT/ML IV SOLN
500.0000 [IU] | Freq: Once | INTRAVENOUS | Status: AC | PRN
  Administered 2014-10-26: 500 [IU]
  Filled 2014-10-26: qty 5

## 2014-10-26 MED ORDER — SODIUM CHLORIDE 0.9 % IV SOLN
Freq: Once | INTRAVENOUS | Status: AC
  Administered 2014-10-26: 12:00:00 via INTRAVENOUS

## 2014-10-26 MED ORDER — SODIUM CHLORIDE 0.9 % IV SOLN
Freq: Once | INTRAVENOUS | Status: AC
  Administered 2014-10-26: 12:00:00 via INTRAVENOUS
  Filled 2014-10-26: qty 4

## 2014-10-26 MED ORDER — DIPHENHYDRAMINE HCL 50 MG/ML IJ SOLN
25.0000 mg | Freq: Once | INTRAMUSCULAR | Status: AC
  Administered 2014-10-26: 25 mg via INTRAVENOUS

## 2014-10-26 MED ORDER — FAMOTIDINE IN NACL 20-0.9 MG/50ML-% IV SOLN
INTRAVENOUS | Status: AC
Start: 2014-10-26 — End: 2014-10-26
  Filled 2014-10-26: qty 50

## 2014-10-26 MED ORDER — PACLITAXEL CHEMO INJECTION 300 MG/50ML
80.0000 mg/m2 | Freq: Once | INTRAVENOUS | Status: AC
  Administered 2014-10-26: 144 mg via INTRAVENOUS
  Filled 2014-10-26: qty 24

## 2014-10-26 MED ORDER — FAMOTIDINE IN NACL 20-0.9 MG/50ML-% IV SOLN
20.0000 mg | Freq: Once | INTRAVENOUS | Status: AC
  Administered 2014-10-26: 20 mg via INTRAVENOUS

## 2014-10-26 MED ORDER — DIPHENHYDRAMINE HCL 50 MG/ML IJ SOLN
INTRAMUSCULAR | Status: AC
  Filled 2014-10-26: qty 1

## 2014-10-26 NOTE — Patient Instructions (Signed)
[facility] Discharge Instructions for Patients Receiving Chemotherapy  Today you received the following chemotherapy agents: Taxol.   To help prevent nausea and vomiting after your treatment, we encourage you to take your nausea medication: Zofran. Take one every 8 hours as needed.    If you develop nausea and vomiting that is not controlled by your nausea medication, call the clinic.   BELOW ARE SYMPTOMS THAT SHOULD BE REPORTED IMMEDIATELY:  *FEVER GREATER THAN 100.5 F  *CHILLS WITH OR WITHOUT FEVER  NAUSEA AND VOMITING THAT IS NOT CONTROLLED WITH YOUR NAUSEA MEDICATION  *UNUSUAL SHORTNESS OF BREATH  *UNUSUAL BRUISING OR BLEEDING  TENDERNESS IN MOUTH AND THROAT WITH OR WITHOUT PRESENCE OF ULCERS  *URINARY PROBLEMS  *BOWEL PROBLEMS  UNUSUAL RASH Items with * indicate a potential emergency and should be followed up as soon as possible.  Feel free to call the clinic should you have any questions or concerns. The clinic phone number is [phone].  Please show the CHEMO ALERT CARD at check-in to the Emergency Department and triage nurse.   

## 2014-10-26 NOTE — Progress Notes (Signed)
[place]  [phone]) [phone] Fax:(336) [phone]     ID: Lummie Montijo DOB: Dec 17, 1950  MR#: [id number]  ANV#:[id number]  Patient Care Team: [name], MD as PCP - General (Family Medicine) [name], MD as Consulting Physician (General Surgery) [name], MD as Consulting Physician (Oncology) [name], MD as Consulting Physician (Radiation Oncology) [name], RN as Registered Nurse [name], RN as Registered Nurse [name], MD as Referring Physician (Cardiology) [name], MD as Referring Physician (Dermatology) [name], NP as Nurse Practitioner (Nurse Practitioner) PCP: [name], MD OTHER MD:  CHIEF COMPLAINT: Estrogen receptor positive breast cancer  CURRENT TREATMENT: Neoadjuvant chemotherapy  BREAST CANCER HISTORY: From the original intake note:  Avina first noted a change in her right breast September 2015. At that time however she was consumed by the care of her mother-in-law, who had severe Alzheimer's disease. She finally passed in January 2016, but Timesha did not share the information regarding the right breast with her husband until late March 2016. At that point she was set up for bilateral diagnostic mammography with tomography at the breast Center, for 11/20/2014. This found the breast density to be category B. In the upper outer quadrant of the right breast there was a 4 cm irregular spiculated mass. There were no other findings of concern. The mass was palpable and firm, and by ultrasound measured 3.3 cm. The right axilla was negative sonographically.  Biopsy of the right breast mass in question was obtained for 03/22/2015. 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  On 07/20/2014 the patient underwent bilateral breast  MRI. This measured the large irregular enhancing mass in the right breast at the 10:00 position at 5.0 cm. There were no additional worrisome findings in the right breast, and no findings in the left breast or any abnormal appearing lymph nodes.  The patient's subsequent history is as detailed below  INTERVAL HISTORY: Aala returns today for follow up of her breast cancer. Today she is due to start cycle 2 of 12 planned weekly cycles of paclitaxel.   REVIEW OF SYSTEMS: Aashi denies fevers, chills, or changes in bowel or bladder habits. She was never nauseous and didn't take a single one of her antiemetic meds. Her taste is slowly recovering so she has been eating better. She denies mouth sores, rashes, or neuropathy symptoms. Her energy level is decent. She walks 1 mile daily for exercise. Her upper respiratory infection improves week by week. She still coughs, but her voice is back as she is no longer hoarse. She denies headaches, dizziness, or weakness. A detailed review of systems is otherwise stable.   PAST MEDICAL HISTORY: Past Medical History  Diagnosis Date  . Breast cancer of upper-outer quadrant of right female breast 07/16/2014  . Diabetes mellitus without complication   . Hypertension   . Skin cancer   . Heart murmur     PAST SURGICAL HISTORY: Past Surgical History  Procedure Laterality Date  . Portacath placement N/A 08/03/2014    Procedure: INSERTION PORT-A-CATH;  Surgeon: [name], MD;  Location: [facility];  Service: General;  Laterality: N/A;    FAMILY HISTORY Family History  Problem Relation Age of Onset  . Lung cancer Father    the patient's father died from lung cancer in the setting of tobacco abuse at the age of  50. The patient's mother died from COPD at the age of 81. The patient has 3 brothers, no sisters. There is no history of breast or ovarian cancer in the family.  GYNECOLOGIC HISTORY:  No LMP recorded. Patient is postmenopausal. Menarche age 89,  first live birth age 19. She is GX P3. She went through the change of life in her late 50s. She did not take hormone replacement.  SOCIAL HISTORY:  Kieren worked for [employer], but is now retired. Her husband [name] is Programmer, systems for a [employer]. They have been married more than 40 years. Son [name] "[name] was "'s Tateanna Bach is a climbing Acupuncturist in [place]. Son [name] "map" Emunah Texidor is a Dealer in [place]. Daughter Ludell Zacarias is a branch bank or for [employer] in [place]. The patient has 3 grandchildren.    ADVANCED DIRECTIVES: In place   HEALTH MAINTENANCE: History  Substance Use Topics  . Smoking status: Former Smoker -- 0.50 packs/day for 12 years    Types: Cigarettes    Quit date: 04/02/1986  . Smokeless tobacco: Never Used  . Alcohol Use: 1.8 oz/week    3 Glasses of wine per week     Colonoscopy: Never  PAP:  Bone density: Never  Lipid panel:  Allergies  Allergen Reactions  . Latex Itching, Dermatitis and Rash  . Lipitor [Atorvastatin] Other (See Comments)    Bad leg cramps    Current Outpatient Prescriptions  Medication Sig Dispense Refill  . aspirin EC 81 MG tablet Take 81 mg by mouth at bedtime.     . cetirizine (ZYRTEC) 10 MG tablet Take 10 mg by mouth daily as needed for allergies.    Marland Kitchen ibuprofen (ADVIL,MOTRIN) 200 MG tablet Take 400 mg by mouth every 6 (six) hours as needed for headache or moderate pain.    Marland Kitchen lidocaine-prilocaine (EMLA) cream Apply topically as needed. Use as directed on port site 1 hour prior to IV access 1 each 3  . lisinopril (PRINIVIL,ZESTRIL) 10 MG tablet Take 10 mg by mouth at bedtime.   6  . MELATONIN PO Take 5 mg by mouth at bedtime.     . metFORMIN (GLUCOPHAGE) 1000 MG tablet Take 1,000 mg by mouth 2 (two) times daily with a meal.    . metoprolol succinate (TOPROL-XL) 100 MG 24 hr tablet Take 100 mg by mouth every evening.     . Omega-3 Fatty Acids (FISH OIL PO) Take 1 capsule by  mouth every evening.     . rosuvastatin (CRESTOR) 40 MG tablet Take by mouth daily. Pt takes 1/2 tablet(76m) on Mondays, Wednesdays, Fridays.    .Marland KitchenHYDROcodone-homatropine (HYCODAN) 5-1.5 MG/5ML syrup Take 5 mLs by mouth every 6 (six) hours as needed for cough. (Patient not taking: Reported on 10/26/2014) 120 mL 0  . prochlorperazine (COMPAZINE) 10 MG tablet Take 1 tablet (10 mg total) by mouth every 6 (six) hours as needed (Nausea or vomiting). (Patient not taking: Reported on 10/26/2014) 30 tablet 1   No current facility-administered medications for this visit.    OBJECTIVE: Middle-aged white woman who appears stated age F54Vitals:   10/26/14 0950  BP: 112/61  Pulse: 90  Temp: 99.2 F (37.3 C)  Resp: 18     Body mass index is 27.96 kg/(m^2).    ECOG FS:1 - Symptomatic but completely ambulatory  Sclerae unicteric, pupils round and equal Oropharynx clear and moist-- no thrush or other lesions No cervical or supraclavicular adenopathy Lungs  no rales or rhonchi Heart regular rate and rhythm Abd soft, nontender, positive bowel sounds MSK no focal spinal tenderness, no upper extremity lymphedema Neuro: nonfocal, well oriented, appropriate affect Breasts: deferred  LAB RESULTS:  CMP     Component Value Date/Time   NA 142 10/26/2014 0941   K 3.6 10/26/2014 0941   CO2 25 10/26/2014 0941   GLUCOSE 171* 10/26/2014 0941   BUN 13.1 10/26/2014 0941   CREATININE 0.7 10/26/2014 0941   CALCIUM 9.2 10/26/2014 0941   PROT 6.2* 10/26/2014 0941   ALBUMIN 3.3* 10/26/2014 0941   AST 17 10/26/2014 0941   ALT 14 10/26/2014 0941   ALKPHOS 60 10/26/2014 0941   BILITOT 0.53 10/26/2014 0941    INo results found for: SPEP, UPEP  Lab Results  Component Value Date   WBC 6.3 10/26/2014   NEUTROABS 5.0 10/26/2014   HGB 10.2* 10/26/2014   HCT 30.6* 10/26/2014   MCV 92.7 10/26/2014   PLT 256 10/26/2014      Chemistry      Component Value Date/Time   NA 142 10/26/2014 0941   K 3.6  10/26/2014 0941   CO2 25 10/26/2014 0941   BUN 13.1 10/26/2014 0941   CREATININE 0.7 10/26/2014 0941      Component Value Date/Time   CALCIUM 9.2 10/26/2014 0941   ALKPHOS 60 10/26/2014 0941   AST 17 10/26/2014 0941   ALT 14 10/26/2014 0941   BILITOT 0.53 10/26/2014 0941       No results found for: LABCA2  No components found for: [id number]  No results for input(s): INR in the last 168 hours.  Urinalysis No results found for: COLORURINE, APPEARANCEUR, LABSPEC, [place], GLUCOSEU, [place], [place], [place], PROTEINUR, UROBILINOGEN, NITRITE, LEUKOCYTESUR  STUDIES: Mr Breast Bilateral W Wo Contrast  10/11/2014   CLINICAL DATA:  Right breast invasive mammary carcinoma diagnosed in April 2016. The patient is undergoing neoadjuvant chemotherapy.  LABS:  None  EXAM: BILATERAL BREAST MRI WITH AND WITHOUT CONTRAST  TECHNIQUE: Multiplanar, multisequence MR images of both breasts were obtained prior to and following the intravenous administration of 15 ml of MultiHance.  THREE-DIMENSIONAL MR IMAGE RENDERING ON INDEPENDENT WORKSTATION:  Three-dimensional MR images were rendered by post-processing of the original MR data on an independent workstation. The three-dimensional MR images were interpreted, and findings are reported in the following complete MRI report for this study. Three dimensional images were evaluated at the independent DynaCad workstation  COMPARISON:  MRI of the breast 07/20/2014  FINDINGS: Breast composition: b. Scattered fibroglandular tissue.  Background parenchymal enhancement: Mild.  Right breast: The previously demonstrated right breast malignancy at 11 o'clock, middle to posterior depth, has decreased in size and measures 2.6 by 2.7 by 2.7 cm. It demonstrates mostly peripheral mixed progressive and rapid wash-in and washout enhancement, and probable central necrosis with most of the viable tumor located superiorly and laterally.  Left breast: No mass or abnormal enhancement.   Lymph nodes: No abnormal appearing lymph nodes.  Ancillary findings:  None.  IMPRESSION: Significant response to chemotherapy with decreased in the size of the necrotic right breast mass.  No evidence of lymphadenopathy by MRI.  RECOMMENDATION: Continued medical and surgical follow-up per treatment plan.  BI-RADS CATEGORY  6: Known biopsy-proven malignancy.   Electronically Signed   By: Fidela Salisbury M.D.   On: 10/11/2014 14:35    ASSESSMENT: 64 y.o. [place] woman status post right breast upper outer quadrant biopsy 07/13/2014, for a clinical T3 N0, stage IIB invasive ductal carcinoma, grade  3, estrogen receptor positive, HER-2 and progesterone receptor negative, with an MIB-1 of 40%  (1) neoadjuvant chemotherapy starting 08/15/2013, consisting of doxorubicin and cyclophosphamide in dose dense fashion 4, completed 09/28/2014, followed by weekly paclitaxel 12 starting 10/19/2014  (2) breast conserving surgery with sentinel lymph node sampling to follow chemotherapy  (3) adjuvant radiation to follow surgery  (4) anti-estrogens to follow radiation  PLAN: Geralda performed remarkably well with her first cycle of paclitaxel. The labs were reviewed in detail and were entirely stable. She will proceed with cycle 2 of paclitaxel today as planned.  Sumer will return in 1 week for cycle 3 of treatment. She understands and agrees with this plan. She knows the goal of treatment in her case is cure. She has been encouraged to call with any issues that might arise before her next visit here.   Laurie Panda, NP   10/26/2014 10:30 AM

## 2014-11-02 ENCOUNTER — Ambulatory Visit (HOSPITAL_BASED_OUTPATIENT_CLINIC_OR_DEPARTMENT_OTHER): Payer: 59

## 2014-11-02 ENCOUNTER — Other Ambulatory Visit (HOSPITAL_BASED_OUTPATIENT_CLINIC_OR_DEPARTMENT_OTHER): Payer: 59

## 2014-11-02 ENCOUNTER — Ambulatory Visit (HOSPITAL_BASED_OUTPATIENT_CLINIC_OR_DEPARTMENT_OTHER): Payer: 59 | Admitting: Nurse Practitioner

## 2014-11-02 ENCOUNTER — Encounter: Payer: Self-pay | Admitting: *Deleted

## 2014-11-02 ENCOUNTER — Encounter: Payer: Self-pay | Admitting: Nurse Practitioner

## 2014-11-02 VITALS — BP 125/56 | HR 70 | Temp 98.8°F | Resp 18 | Ht 63.0 in | Wt 156.7 lb

## 2014-11-02 DIAGNOSIS — Z5111 Encounter for antineoplastic chemotherapy: Secondary | ICD-10-CM

## 2014-11-02 DIAGNOSIS — C50411 Malignant neoplasm of upper-outer quadrant of right female breast: Secondary | ICD-10-CM | POA: Diagnosis not present

## 2014-11-02 DIAGNOSIS — Z170 Estrogen receptor positive status [ER+]: Secondary | ICD-10-CM

## 2014-11-02 LAB — CBC WITH DIFFERENTIAL/PLATELET
BASO%: 1.1 % (ref 0.0–2.0)
Basophils Absolute: 0 10*3/uL (ref 0.0–0.1)
EOS%: 1.6 % (ref 0.0–7.0)
Eosinophils Absolute: 0.1 10*3/uL (ref 0.0–0.5)
HEMATOCRIT: 30.9 % — AB (ref 34.8–46.6)
HEMOGLOBIN: 10.4 g/dL — AB (ref 11.6–15.9)
LYMPH#: 0.5 10*3/uL — AB (ref 0.9–3.3)
LYMPH%: 12.6 % — ABNORMAL LOW (ref 14.0–49.7)
MCH: 31.3 pg (ref 25.1–34.0)
MCHC: 33.5 g/dL (ref 31.5–36.0)
MCV: 93.6 fL (ref 79.5–101.0)
MONO#: 0.4 10*3/uL (ref 0.1–0.9)
MONO%: 9.7 % (ref 0.0–14.0)
NEUT#: 2.8 10*3/uL (ref 1.5–6.5)
NEUT%: 75 % (ref 38.4–76.8)
Platelets: 237 10*3/uL (ref 145–400)
RBC: 3.31 10*6/uL — AB (ref 3.70–5.45)
RDW: 20.2 % — AB (ref 11.2–14.5)
WBC: 3.7 10*3/uL — ABNORMAL LOW (ref 3.9–10.3)

## 2014-11-02 MED ORDER — FAMOTIDINE IN NACL 20-0.9 MG/50ML-% IV SOLN
20.0000 mg | Freq: Once | INTRAVENOUS | Status: AC
  Administered 2014-11-02: 20 mg via INTRAVENOUS

## 2014-11-02 MED ORDER — DIPHENHYDRAMINE HCL 50 MG/ML IJ SOLN
25.0000 mg | Freq: Once | INTRAMUSCULAR | Status: AC
  Administered 2014-11-02: 25 mg via INTRAVENOUS

## 2014-11-02 MED ORDER — FAMOTIDINE IN NACL 20-0.9 MG/50ML-% IV SOLN
INTRAVENOUS | Status: AC
  Filled 2014-11-02: qty 50

## 2014-11-02 MED ORDER — DEXTROSE 5 % IV SOLN
80.0000 mg/m2 | Freq: Once | INTRAVENOUS | Status: AC
  Administered 2014-11-02: 144 mg via INTRAVENOUS
  Filled 2014-11-02: qty 24

## 2014-11-02 MED ORDER — SODIUM CHLORIDE 0.9 % IV SOLN
Freq: Once | INTRAVENOUS | Status: AC
  Administered 2014-11-02: 10:00:00 via INTRAVENOUS
  Filled 2014-11-02: qty 4

## 2014-11-02 MED ORDER — SODIUM CHLORIDE 0.9 % IV SOLN
Freq: Once | INTRAVENOUS | Status: AC
  Administered 2014-11-02: 09:00:00 via INTRAVENOUS

## 2014-11-02 MED ORDER — DIPHENHYDRAMINE HCL 50 MG/ML IJ SOLN
INTRAMUSCULAR | Status: AC
  Filled 2014-11-02: qty 1

## 2014-11-02 MED ORDER — SODIUM CHLORIDE 0.9 % IJ SOLN
10.0000 mL | INTRAMUSCULAR | Status: DC | PRN
  Administered 2014-11-02: 10 mL
  Filled 2014-11-02: qty 10

## 2014-11-02 MED ORDER — HEPARIN SOD (PORK) LOCK FLUSH 100 UNIT/ML IV SOLN
500.0000 [IU] | Freq: Once | INTRAVENOUS | Status: AC | PRN
  Administered 2014-11-02: 500 [IU]
  Filled 2014-11-02: qty 5

## 2014-11-02 NOTE — Progress Notes (Signed)
[place]  Telephone:(336) [phone] Fax:(336) [phone]     ID: Darlene Kelly DOB: May 15, 1950  MR#: [id number]  IWP#:[id number]  Patient Care Team: [name], MD as PCP - General (Family Medicine) [name], MD as Consulting Physician (General Surgery) [name], MD as Consulting Physician (Oncology) [name], MD as Consulting Physician (Radiation Oncology) [name], RN as Registered Nurse [name], RN as Registered Nurse [name], MD as Referring Physician (Cardiology) [name], MD as Referring Physician (Dermatology) [name], NP as Nurse Practitioner (Nurse Practitioner) PCP: [name], MD OTHER MD:  CHIEF COMPLAINT: Estrogen receptor positive breast cancer  CURRENT TREATMENT: Neoadjuvant chemotherapy  BREAST CANCER HISTORY: From the original intake note:  Khady first noted a change in her right breast September 2015. At that time however she was consumed by the care of her mother-in-law, who had severe Alzheimer's disease. She finally passed in January 2016, but Donika did not share the information regarding the right breast with her husband until late March 2016. At that point she was set up for bilateral diagnostic mammography with tomography at the breast Center, for 11/20/2014. This found the breast density to be category B. In the upper outer quadrant of the right breast there was a 4 cm irregular spiculated mass. There were no other findings of concern. The mass was palpable and firm, and by ultrasound measured 3.3 cm. The right axilla was negative sonographically.  Biopsy of the right breast mass in question was obtained for 03/22/2015. 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  On 07/20/2014 the patient underwent bilateral breast  MRI. This measured the large irregular enhancing mass in the right breast at the 10:00 position at 5.0 cm. There were no additional worrisome findings in the right breast, and no findings in the left breast or any abnormal appearing lymph nodes.  The patient's subsequent history is as detailed below  INTERVAL HISTORY: Marita returns today for follow up of her breast cancer. Today she is due for cycle 3 of 12 planned weekly cycles of paclitaxel.   REVIEW OF SYSTEMS: Jhordan denies fevers, chills, or changes in bowel or bladder habits. She has no nausea or vomiting. Her taste is completely back and so is her appetite. She denies mouth sores, rashes, or neuropathy symptoms. Her energy level is decent. She walks 1 mile daily for exercise. She has right sided sinus congestion, but is otherwise over most of her cold symptoms. She denies headaches, dizziness, or weakness. A detailed review of systems is otherwise stable.   PAST MEDICAL HISTORY: Past Medical History  Diagnosis Date  . Breast cancer of upper-outer quadrant of right female breast 07/16/2014  . Diabetes mellitus without complication   . Hypertension   . Skin cancer   . Heart murmur     PAST SURGICAL HISTORY: Past Surgical History  Procedure Laterality Date  . Portacath placement N/A 08/03/2014    Procedure: INSERTION PORT-A-CATH;  Surgeon: [name], MD;  Location: [facility];  Service: General;  Laterality: N/A;    FAMILY HISTORY Family History  Problem Relation Age of Onset  . Lung cancer Father    the patient's father died from lung cancer in the setting of tobacco abuse at the age of 65. The patient's mother died from COPD at the age of 44. The patient has 3 brothers,  no sisters. There is no history of breast or ovarian cancer in the family.  GYNECOLOGIC HISTORY:  No LMP recorded. Patient is postmenopausal. Menarche age 64, first live birth age 40. She is GX P3. She went through the change of life in her late 27s. She  did not take hormone replacement.  SOCIAL HISTORY:  Brittini worked for [employer], but is now retired. Her husband [name] is Programmer, systems for a [employer]. They have been married more than 40 years. Son [name] "[name] was "'s Nyajah Hyson is a climbing Acupuncturist in [place]. Son [name] "map" Raguel Kosloski is a Dealer in [place]. Daughter Elvena Oyer is a branch bank or for [employer] in [place]. The patient has 3 grandchildren.    ADVANCED DIRECTIVES: In place   HEALTH MAINTENANCE: History  Substance Use Topics  . Smoking status: Former Smoker -- 0.50 packs/day for 12 years    Types: Cigarettes    Quit date: 04/02/1986  . Smokeless tobacco: Never Used  . Alcohol Use: 1.8 oz/week    3 Glasses of wine per week     Colonoscopy: Never  PAP:  Bone density: Never  Lipid panel:  Allergies  Allergen Reactions  . Latex Itching, Dermatitis and Rash  . Lipitor [Atorvastatin] Other (See Comments)    Bad leg cramps    Current Outpatient Prescriptions  Medication Sig Dispense Refill  . aspirin EC 81 MG tablet Take 81 mg by mouth at bedtime.     . cetirizine (ZYRTEC) 10 MG tablet Take 10 mg by mouth daily as needed for allergies.    Marland Kitchen lidocaine-prilocaine (EMLA) cream Apply topically as needed. Use as directed on port site 1 hour prior to IV access 1 each 3  . lisinopril (PRINIVIL,ZESTRIL) 10 MG tablet Take 10 mg by mouth at bedtime.   6  . MELATONIN PO Take 5 mg by mouth at bedtime.     . metFORMIN (GLUCOPHAGE) 1000 MG tablet Take 1,000 mg by mouth 2 (two) times daily with a meal.    . metoprolol succinate (TOPROL-XL) 100 MG 24 hr tablet Take 100 mg by mouth every evening.     . Omega-3 Fatty Acids (FISH OIL PO) Take 1 capsule by mouth every evening.     . rosuvastatin (CRESTOR) 40 MG tablet Take by mouth daily. Pt takes 1/2 tablet(8m) on Mondays, Wednesdays, Fridays.    .Marland KitchenHYDROcodone-homatropine (HYCODAN) 5-1.5 MG/5ML syrup Take 5 mLs by mouth  every 6 (six) hours as needed for cough. (Patient not taking: Reported on 10/26/2014) 120 mL 0  . ibuprofen (ADVIL,MOTRIN) 200 MG tablet Take 400 mg by mouth every 6 (six) hours as needed for headache or moderate pain.    .Marland Kitchenprochlorperazine (COMPAZINE) 10 MG tablet Take 1 tablet (10 mg total) by mouth every 6 (six) hours as needed (Nausea or vomiting). (Patient not taking: Reported on 10/26/2014) 30 tablet 1   No current facility-administered medications for this visit.    OBJECTIVE: Middle-aged white woman who appears stated age F61Vitals:   11/02/14 0835  BP: 125/56  Pulse: 70  Temp: 98.8 F (37.1 C)  Resp: 18     Body mass index is 27.77 kg/(m^2).    ECOG FS:1 - Symptomatic but completely ambulatory  Skin: warm, dry  HEENT: sclerae anicteric, conjunctivae pink, oropharynx clear. No thrush or mucositis.  Lymph Nodes: No cervical or supraclavicular lymphadenopathy  Lungs: clear to auscultation bilaterally, no rales, wheezes, or rhonci  Heart: regular rate  and rhythm, 3/6 systolic murmur  Abdomen: round, soft, non tender, positive bowel sounds  Musculoskeletal: No focal spinal tenderness, no peripheral edema  Neuro: non focal, well oriented, positive affect  Breasts: deferred  LAB RESULTS:  CMP     Component Value Date/Time   NA 142 10/26/2014 0941   K 3.6 10/26/2014 0941   CO2 25 10/26/2014 0941   GLUCOSE 171* 10/26/2014 0941   BUN 13.1 10/26/2014 0941   CREATININE 0.7 10/26/2014 0941   CALCIUM 9.2 10/26/2014 0941   PROT 6.2* 10/26/2014 0941   ALBUMIN 3.3* 10/26/2014 0941   AST 17 10/26/2014 0941   ALT 14 10/26/2014 0941   ALKPHOS 60 10/26/2014 0941   BILITOT 0.53 10/26/2014 0941    INo results found for: SPEP, UPEP  Lab Results  Component Value Date   WBC 3.7* 11/02/2014   NEUTROABS 2.8 11/02/2014   HGB 10.4* 11/02/2014   HCT 30.9* 11/02/2014   MCV 93.6 11/02/2014   PLT 237 11/02/2014      Chemistry      Component Value Date/Time   NA 142 10/26/2014  0941   K 3.6 10/26/2014 0941   CO2 25 10/26/2014 0941   BUN 13.1 10/26/2014 0941   CREATININE 0.7 10/26/2014 0941      Component Value Date/Time   CALCIUM 9.2 10/26/2014 0941   ALKPHOS 60 10/26/2014 0941   AST 17 10/26/2014 0941   ALT 14 10/26/2014 0941   BILITOT 0.53 10/26/2014 0941       No results found for: LABCA2  No components found for: [id number]  No results for input(s): INR in the last 168 hours.  Urinalysis No results found for: COLORURINE, APPEARANCEUR, LABSPEC, [place], GLUCOSEU, [place], [place], [place], PROTEINUR, UROBILINOGEN, NITRITE, LEUKOCYTESUR  STUDIES: Mr Breast Bilateral W Wo Contrast  10/11/2014   CLINICAL DATA:  Right breast invasive mammary carcinoma diagnosed in April 2016. The patient is undergoing neoadjuvant chemotherapy.  LABS:  None  EXAM: BILATERAL BREAST MRI WITH AND WITHOUT CONTRAST  TECHNIQUE: Multiplanar, multisequence MR images of both breasts were obtained prior to and following the intravenous administration of 15 ml of MultiHance.  THREE-DIMENSIONAL MR IMAGE RENDERING ON INDEPENDENT WORKSTATION:  Three-dimensional MR images were rendered by post-processing of the original MR data on an independent workstation. The three-dimensional MR images were interpreted, and findings are reported in the following complete MRI report for this study. Three dimensional images were evaluated at the independent DynaCad workstation  COMPARISON:  MRI of the breast 07/20/2014  FINDINGS: Breast composition: b. Scattered fibroglandular tissue.  Background parenchymal enhancement: Mild.  Right breast: The previously demonstrated right breast malignancy at 11 o'clock, middle to posterior depth, has decreased in size and measures 2.6 by 2.7 by 2.7 cm. It demonstrates mostly peripheral mixed progressive and rapid wash-in and washout enhancement, and probable central necrosis with most of the viable tumor located superiorly and laterally.  Left breast: No mass or abnormal  enhancement.  Lymph nodes: No abnormal appearing lymph nodes.  Ancillary findings:  None.  IMPRESSION: Significant response to chemotherapy with decreased in the size of the necrotic right breast mass.  No evidence of lymphadenopathy by MRI.  RECOMMENDATION: Continued medical and surgical follow-up per treatment plan.  BI-RADS CATEGORY  6: Known biopsy-proven malignancy.   Electronically Signed   By: Fidela Salisbury M.D.   On: 10/11/2014 14:35    ASSESSMENT: 64 y.o. [place] woman status post right breast upper outer quadrant biopsy 07/13/2014, for a clinical T3 N0, stage IIB invasive ductal  carcinoma, grade 3, estrogen receptor positive, HER-2 and progesterone receptor negative, with an MIB-1 of 40%  (1) neoadjuvant chemotherapy starting 08/15/2013, consisting of doxorubicin and cyclophosphamide in dose dense fashion 4, completed 09/28/2014, followed by weekly paclitaxel 12 starting 10/19/2014  (2) breast conserving surgery with sentinel lymph node sampling to follow chemotherapy  (3) adjuvant radiation to follow surgery  (4) anti-estrogens to follow radiation  PLAN: Janyth continues to tolerate treatment remarkably well. The labs were reviewed in detail and are entirely stable. She will proceed with cycle 3 of paclitaxel today as planned.  Khloie will return in 1 week for cycle 4 of treatment. She understands and agrees with this plan. She knows the goal of treatment in her case is cure. She has been encouraged to call with any issues that might arise before her next visit here.   Laurie Panda, NP   11/02/2014 8:54 AM

## 2014-11-02 NOTE — Patient Instructions (Signed)
[facility] Discharge Instructions for Patients Receiving Chemotherapy  Today you received the following chemotherapy agents:  Taxol  To help prevent nausea and vomiting after your treatment, we encourage you to take your nausea medication as prescribed.   If you develop nausea and vomiting that is not controlled by your nausea medication, call the clinic.   BELOW ARE SYMPTOMS THAT SHOULD BE REPORTED IMMEDIATELY:  *FEVER GREATER THAN 100.5 F  *CHILLS WITH OR WITHOUT FEVER  NAUSEA AND VOMITING THAT IS NOT CONTROLLED WITH YOUR NAUSEA MEDICATION  *UNUSUAL SHORTNESS OF BREATH  *UNUSUAL BRUISING OR BLEEDING  TENDERNESS IN MOUTH AND THROAT WITH OR WITHOUT PRESENCE OF ULCERS  *URINARY PROBLEMS  *BOWEL PROBLEMS  UNUSUAL RASH Items with * indicate a potential emergency and should be followed up as soon as possible.  Feel free to call the clinic you have any questions or concerns. The clinic phone number is [phone].  Please show the CHEMO ALERT CARD at check-in to the Emergency Department and triage nurse.   

## 2014-11-02 NOTE — Progress Notes (Signed)
Per Dr. Jana Hakim,  [place] to use port for chemo. Per Gentry Fitz, NP -  OK to treat without CMET today.  Lattie Haw, pharmacist notified. Per Marisa Sprinkles, Clinical nurse specialist -  OK to use port for chemo ( as per Burman Nieves, nurse manager ).

## 2014-11-09 ENCOUNTER — Other Ambulatory Visit: Payer: 59

## 2014-11-09 ENCOUNTER — Ambulatory Visit: Payer: 59

## 2014-11-10 ENCOUNTER — Encounter: Payer: Self-pay | Admitting: Nurse Practitioner

## 2014-11-10 ENCOUNTER — Other Ambulatory Visit: Payer: Self-pay | Admitting: Hematology and Oncology

## 2014-11-10 ENCOUNTER — Ambulatory Visit (HOSPITAL_BASED_OUTPATIENT_CLINIC_OR_DEPARTMENT_OTHER): Payer: 59 | Admitting: Nurse Practitioner

## 2014-11-10 ENCOUNTER — Ambulatory Visit (HOSPITAL_BASED_OUTPATIENT_CLINIC_OR_DEPARTMENT_OTHER): Payer: 59

## 2014-11-10 ENCOUNTER — Telehealth: Payer: Self-pay | Admitting: Nurse Practitioner

## 2014-11-10 ENCOUNTER — Other Ambulatory Visit (HOSPITAL_BASED_OUTPATIENT_CLINIC_OR_DEPARTMENT_OTHER): Payer: 59

## 2014-11-10 VITALS — BP 143/71 | HR 70 | Temp 98.7°F | Resp 18 | Ht 63.0 in | Wt 157.6 lb

## 2014-11-10 DIAGNOSIS — Z170 Estrogen receptor positive status [ER+]: Secondary | ICD-10-CM

## 2014-11-10 DIAGNOSIS — Z5111 Encounter for antineoplastic chemotherapy: Secondary | ICD-10-CM

## 2014-11-10 DIAGNOSIS — C50411 Malignant neoplasm of upper-outer quadrant of right female breast: Secondary | ICD-10-CM

## 2014-11-10 LAB — COMPREHENSIVE METABOLIC PANEL (CC13)
ALT: 17 U/L (ref 0–55)
ANION GAP: 9 meq/L (ref 3–11)
AST: 17 U/L (ref 5–34)
Albumin: 3.7 g/dL (ref 3.5–5.0)
Alkaline Phosphatase: 59 U/L (ref 40–150)
BILIRUBIN TOTAL: 0.62 mg/dL (ref 0.20–1.20)
BUN: 14.8 mg/dL (ref 7.0–26.0)
CALCIUM: 9.6 mg/dL (ref 8.4–10.4)
CHLORIDE: 106 meq/L (ref 98–109)
CO2: 25 meq/L (ref 22–29)
CREATININE: 0.7 mg/dL (ref 0.6–1.1)
EGFR: >90 mL/min/{1.73_m2} (ref >90)
Glucose: 122 mg/dl (ref 70–140)
Potassium: 4.3 mEq/L (ref 3.5–5.1)
SODIUM: 141 meq/L (ref 136–145)
TOTAL PROTEIN: 6.4 g/dL (ref 6.4–8.3)

## 2014-11-10 LAB — CBC WITH DIFFERENTIAL/PLATELET
BASO%: 0.5 % (ref 0.0–2.0)
Basophils Absolute: 0 10*3/uL (ref 0.0–0.1)
EOS%: 2 % (ref 0.0–7.0)
Eosinophils Absolute: 0.1 10*3/uL (ref 0.0–0.5)
HCT: 32.8 % — ABNORMAL LOW (ref 34.8–46.6)
HGB: 11 g/dL — ABNORMAL LOW (ref 11.6–15.9)
LYMPH#: 0.5 10*3/uL — AB (ref 0.9–3.3)
LYMPH%: 15.5 % (ref 14.0–49.7)
MCH: 31.4 pg (ref 25.1–34.0)
MCHC: 33.5 g/dL (ref 31.5–36.0)
MCV: 93.9 fL (ref 79.5–101.0)
MONO#: 0.4 10*3/uL (ref 0.1–0.9)
MONO%: 11.4 % (ref 0.0–14.0)
NEUT%: 70.6 % (ref 38.4–76.8)
NEUTROS ABS: 2.5 10*3/uL (ref 1.5–6.5)
PLATELETS: 163 10*3/uL (ref 145–400)
RBC: 3.5 10*6/uL — ABNORMAL LOW (ref 3.70–5.45)
RDW: 18.4 % — AB (ref 11.2–14.5)
WBC: 3.5 10*3/uL — AB (ref 3.9–10.3)

## 2014-11-10 MED ORDER — DIPHENHYDRAMINE HCL 50 MG/ML IJ SOLN
INTRAMUSCULAR | Status: AC
  Filled 2014-11-10: qty 1

## 2014-11-10 MED ORDER — HEPARIN SOD (PORK) LOCK FLUSH 100 UNIT/ML IV SOLN
500.0000 [IU] | Freq: Once | INTRAVENOUS | Status: AC | PRN
  Administered 2014-11-10: 500 [IU]
  Filled 2014-11-10: qty 5

## 2014-11-10 MED ORDER — SODIUM CHLORIDE 0.9 % IJ SOLN
10.0000 mL | INTRAMUSCULAR | Status: DC | PRN
  Administered 2014-11-10: 10 mL
  Filled 2014-11-10: qty 10

## 2014-11-10 MED ORDER — DIPHENHYDRAMINE HCL 50 MG/ML IJ SOLN
25.0000 mg | Freq: Once | INTRAMUSCULAR | Status: AC
  Administered 2014-11-10: 25 mg via INTRAVENOUS

## 2014-11-10 MED ORDER — SODIUM CHLORIDE 0.9 % IV SOLN
Freq: Once | INTRAVENOUS | Status: AC
  Administered 2014-11-10: 11:00:00 via INTRAVENOUS

## 2014-11-10 MED ORDER — FAMOTIDINE IN NACL 20-0.9 MG/50ML-% IV SOLN
INTRAVENOUS | Status: AC
  Filled 2014-11-10: qty 50

## 2014-11-10 MED ORDER — FAMOTIDINE IN NACL 20-0.9 MG/50ML-% IV SOLN
20.0000 mg | Freq: Once | INTRAVENOUS | Status: AC
  Administered 2014-11-10: 20 mg via INTRAVENOUS

## 2014-11-10 MED ORDER — PACLITAXEL CHEMO INJECTION 300 MG/50ML
80.0000 mg/m2 | Freq: Once | INTRAVENOUS | Status: AC
  Administered 2014-11-10: 144 mg via INTRAVENOUS
  Filled 2014-11-10: qty 24

## 2014-11-10 MED ORDER — SODIUM CHLORIDE 0.9 % IV SOLN
Freq: Once | INTRAVENOUS | Status: AC
  Administered 2014-11-10: 12:00:00 via INTRAVENOUS
  Filled 2014-11-10: qty 4

## 2014-11-10 NOTE — Progress Notes (Signed)
Darlene Kelly  Telephone:([phone] Fax:(336) [phone]     ID: Darlene Kelly DOB: Apr 21, 1950  MR#: [id number]  YIF#:[id number]  Patient Care Team: [name], MD as PCP - General (Family Medicine) [name], MD as Consulting Physician (General Surgery) [name], MD as Consulting Physician (Oncology) [name], MD as Consulting Physician (Radiation Oncology) [name], RN as Registered Nurse [name], RN as Registered Nurse [name], MD as Referring Physician (Cardiology) [name], MD as Referring Physician (Dermatology) [name], NP as Nurse Practitioner (Nurse Practitioner) PCP: [name], MD OTHER MD:  CHIEF COMPLAINT: Estrogen receptor positive breast cancer  CURRENT TREATMENT: Neoadjuvant chemotherapy  BREAST CANCER HISTORY: From the original intake note:  Leshea first noted a change in her right breast September 2015. At that time however she was consumed by the care of her mother-in-law, who had severe Alzheimer's disease. She finally passed in January 2016, but Darlene Kelly did not share the information regarding the right breast with her husband until late March 2016. At that point she was set up for bilateral diagnostic mammography with tomography at the breast Center, for 11/20/2014. This found the breast density to be category B. In the upper outer quadrant of the right breast there was a 4 cm irregular spiculated mass. There were no other findings of concern. The mass was palpable and firm, and by ultrasound measured 3.3 cm. The right axilla was negative sonographically.  Biopsy of the right breast mass in question was obtained for 03/22/2015. 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  On 07/20/2014 the patient underwent bilateral breast  MRI. This measured the large irregular enhancing mass in the right breast at the 10:00 position at 5.0 cm. There were no additional worrisome findings in the right breast, and no findings in the left breast or any abnormal appearing lymph nodes.  The patient's subsequent history is as detailed below  INTERVAL HISTORY: Darlene Kelly returns today for follow up of her breast cancer. Today she is due for cycle 4 of 12 planned weekly cycles of paclitaxel.   REVIEW OF SYSTEMS: Dazani denies fevers, chills, nausea, vomiting, or changes in bowel or bladder habits. She denies mouth sores, rashes, or neuropathy symptoms. Her energy level is excellent. She is up to walking 1.5 miles daily for exercise. She has no more sinus symptoms. She has no shortness of breath, chest pain, cough, or palpatations. She denies headaches, dizziness, or weakness. A detailed review of systems is otherwise stable.   PAST MEDICAL HISTORY: Past Medical History  Diagnosis Date  . Breast cancer of upper-outer quadrant of right female breast 07/16/2014  . Diabetes mellitus without complication   . Hypertension   . Skin cancer   . Heart murmur     PAST SURGICAL HISTORY: Past Surgical History  Procedure Laterality Date  . Portacath placement N/A 08/03/2014    Procedure: INSERTION PORT-A-CATH;  Surgeon: [name], MD;  Location: [facility];  Service: General;  Laterality: N/A;    FAMILY HISTORY Family History  Problem Relation Age of Onset  . Lung cancer Father    the patient's father died from lung cancer in the setting of tobacco abuse at the age of 73. The patient's mother died from COPD at the age of 54. The patient has 3 brothers, no sisters. There is no history of breast or  ovarian cancer in the family.  GYNECOLOGIC HISTORY:  No LMP recorded. Patient is postmenopausal. Menarche age 64, first live birth age 64. She is GX P3. She went through the change of life in her late 64s. She did not take hormone  replacement.  SOCIAL HISTORY:  Aleyda worked for [employer], but is now retired. Her husband [name] is Programmer, systems for a [employer]. They have been married more than 40 years. Son Darlene Kelly "Darlene Kelly was "'s Darlene Kelly is a climbing Acupuncturist in [place]. Son Darlene Kelly "map" Darlene Kelly is a Dealer in [place]. [name] is a branch bank or for [employer] in [place]. The patient has 3 grandchildren.    ADVANCED DIRECTIVES: In place   HEALTH MAINTENANCE: Social History  Substance Use Topics  . Smoking status: Former Smoker -- 0.50 packs/day for 12 years    Types: Cigarettes    Quit date: 04/02/1986  . Smokeless tobacco: Never Used  . Alcohol Use: 1.8 oz/week    3 Glasses of wine per week     Colonoscopy: Never  PAP:  Bone density: Never  Lipid panel:  Allergies  Allergen Reactions  . Latex Itching, Dermatitis and Rash  . Lipitor [Atorvastatin] Other (See Comments)    Bad leg cramps    Current Outpatient Prescriptions  Medication Sig Dispense Refill  . aspirin EC 81 MG tablet Take 81 mg by mouth at bedtime.     . cetirizine (ZYRTEC) 10 MG tablet Take 10 mg by mouth daily as needed for allergies.    Marland Kitchen lidocaine-prilocaine (EMLA) cream Apply topically as needed. Use as directed on port site 1 hour prior to IV access 1 each 3  . lisinopril (PRINIVIL,ZESTRIL) 10 MG tablet Take 10 mg by mouth at bedtime.   6  . MELATONIN PO Take 5 mg by mouth at bedtime.     . metFORMIN (GLUCOPHAGE) 1000 MG tablet Take 1,000 mg by mouth 2 (two) times daily with a meal.    . metoprolol succinate (TOPROL-XL) 100 MG 24 hr tablet Take 100 mg by mouth every evening.     . Omega-3 Fatty Acids (FISH OIL PO) Take 1 capsule by mouth every evening.     . rosuvastatin (CRESTOR) 40 MG tablet Take by mouth daily. Pt takes 1/2 tablet(21m) on Mondays, Wednesdays, Fridays.    .Marland KitchenHYDROcodone-homatropine (HYCODAN) 5-1.5 MG/5ML syrup Take 5 mLs by mouth every 6 (six)  hours as needed for cough. (Patient not taking: Reported on 10/26/2014) 120 mL 0  . ibuprofen (ADVIL,MOTRIN) 200 MG tablet Take 400 mg by mouth every 6 (six) hours as needed for headache or moderate pain.    .Marland Kitchenprochlorperazine (COMPAZINE) 10 MG tablet Take 1 tablet (10 mg total) by mouth every 6 (six) hours as needed (Nausea or vomiting). (Patient not taking: Reported on 10/26/2014) 30 tablet 1   No current facility-administered medications for this visit.    OBJECTIVE: Middle-aged white woman who appears stated age F27Vitals:   11/10/14 0926  BP: 143/71  Pulse: 70  Temp: 98.7 F (37.1 C)  Resp: 18     Body mass index is 27.92 kg/(m^2).    ECOG FS:1 - Symptomatic but completely ambulatory  Sclerae unicteric, pupils round and equal Oropharynx clear and moist-- no thrush or other lesions No cervical or supraclavicular adenopathy Lungs no rales or rhonchi Heart regular rate and rhythm Abd soft, nontender, positive bowel sounds MSK no focal spinal tenderness, no upper extremity lymphedema Neuro:  nonfocal, well oriented, appropriate affect Breasts: deferred  LAB RESULTS:  CMP     Component Value Date/Time   NA 142 10/26/2014 0941   K 3.6 10/26/2014 0941   CO2 25 10/26/2014 0941   GLUCOSE 171* 10/26/2014 0941   BUN 13.1 10/26/2014 0941   CREATININE 0.7 10/26/2014 0941   CALCIUM 9.2 10/26/2014 0941   PROT 6.2* 10/26/2014 0941   ALBUMIN 3.3* 10/26/2014 0941   AST 17 10/26/2014 0941   ALT 14 10/26/2014 0941   ALKPHOS 60 10/26/2014 0941   BILITOT 0.53 10/26/2014 0941    INo results found for: SPEP, UPEP  Lab Results  Component Value Date   WBC 3.5* 11/10/2014   NEUTROABS 2.5 11/10/2014   HGB 11.0* 11/10/2014   HCT 32.8* 11/10/2014   MCV 93.9 11/10/2014   PLT 163 11/10/2014      Chemistry      Component Value Date/Time   NA 142 10/26/2014 0941   K 3.6 10/26/2014 0941   CO2 25 10/26/2014 0941   BUN 13.1 10/26/2014 0941   CREATININE 0.7 10/26/2014 0941       Component Value Date/Time   CALCIUM 9.2 10/26/2014 0941   ALKPHOS 60 10/26/2014 0941   AST 17 10/26/2014 0941   ALT 14 10/26/2014 0941   BILITOT 0.53 10/26/2014 0941       No results found for: LABCA2  No components found for: GBTDV761  No results for input(s): INR in the last 168 hours.  Urinalysis No results found for: COLORURINE, APPEARANCEUR, LABSPEC, [place], GLUCOSEU, [place], [place], [place], PROTEINUR, UROBILINOGEN, NITRITE, LEUKOCYTESUR  STUDIES: Mr Breast Bilateral W Wo Contrast  10/11/2014   CLINICAL DATA:  Right breast invasive mammary carcinoma diagnosed in April 2016. The patient is undergoing neoadjuvant chemotherapy.  LABS:  None  EXAM: BILATERAL BREAST MRI WITH AND WITHOUT CONTRAST  TECHNIQUE: Multiplanar, multisequence MR images of both breasts were obtained prior to and following the intravenous administration of 15 ml of MultiHance.  THREE-DIMENSIONAL MR IMAGE RENDERING ON INDEPENDENT WORKSTATION:  Three-dimensional MR images were rendered by post-processing of the original MR data on an independent workstation. The three-dimensional MR images were interpreted, and findings are reported in the following complete MRI report for this study. Three dimensional images were evaluated at the independent DynaCad workstation  COMPARISON:  MRI of the breast 07/20/2014  FINDINGS: Breast composition: b. Scattered fibroglandular tissue.  Background parenchymal enhancement: Mild.  Right breast: The previously demonstrated right breast malignancy at 11 o'clock, middle to posterior depth, has decreased in size and measures 2.6 by 2.7 by 2.7 cm. It demonstrates mostly peripheral mixed progressive and rapid wash-in and washout enhancement, and probable central necrosis with most of the viable tumor located superiorly and laterally.  Left breast: No mass or abnormal enhancement.  Lymph nodes: No abnormal appearing lymph nodes.  Ancillary findings:  None.  IMPRESSION: Significant response  to chemotherapy with decreased in the size of the necrotic right breast mass.  No evidence of lymphadenopathy by MRI.  RECOMMENDATION: Continued medical and surgical follow-up per treatment plan.  BI-RADS CATEGORY  6: Known biopsy-proven malignancy.   Electronically Signed   By: Fidela Salisbury M.D.   On: 10/11/2014 14:35    ASSESSMENT: 64 y.o. [place] woman status post right breast upper outer quadrant biopsy 07/13/2014, for a clinical T3 N0, stage IIB invasive ductal carcinoma, grade 3, estrogen receptor positive, HER-2 and progesterone receptor negative, with an MIB-1 of 40%  (1) neoadjuvant chemotherapy starting 08/15/2013, consisting of doxorubicin and cyclophosphamide  in dose dense fashion 4, completed 09/28/2014, followed by weekly paclitaxel 12 starting 10/19/2014  (2) breast conserving surgery with sentinel lymph node sampling to follow chemotherapy  (3) adjuvant radiation to follow surgery  (4) anti-estrogens to follow radiation  PLAN: Ayari continues to tolerate treatment well with hardly any side effects. The labs were reviewed in detail and are entirely stable. She will proceed with cycle 4 of paclitaxel today as planned.  Darlene Kelly will return in 1 week for cycle 5 of treatment. She understands and agrees with this plan. She knows the goal of treatment in her case is cure. She has been encouraged to call with any issues that might arise before her next visit here.   Laurie Panda, NP   11/10/2014 9:43 AM

## 2014-11-10 NOTE — Telephone Encounter (Signed)
Gave avs & calendar for August/September °

## 2014-11-10 NOTE — Patient Instructions (Signed)
[facility] Discharge Instructions for Patients Receiving Chemotherapy  Today you received the following chemotherapy agents Taxol  To help prevent nausea and vomiting after your treatment, we encourage you to take your nausea medication   If you develop nausea and vomiting that is not controlled by your nausea medication, call the clinic.   BELOW ARE SYMPTOMS THAT SHOULD BE REPORTED IMMEDIATELY:  *FEVER GREATER THAN 100.5 F  *CHILLS WITH OR WITHOUT FEVER  NAUSEA AND VOMITING THAT IS NOT CONTROLLED WITH YOUR NAUSEA MEDICATION  *UNUSUAL SHORTNESS OF BREATH  *UNUSUAL BRUISING OR BLEEDING  TENDERNESS IN MOUTH AND THROAT WITH OR WITHOUT PRESENCE OF ULCERS  *URINARY PROBLEMS  *BOWEL PROBLEMS  UNUSUAL RASH Items with * indicate a potential emergency and should be followed up as soon as possible.  Feel free to call the clinic you have any questions or concerns. The clinic phone number is [phone].  Please show the CHEMO ALERT CARD at check-in to the Emergency Department and triage nurse.   

## 2014-11-16 ENCOUNTER — Ambulatory Visit: Payer: 59

## 2014-11-16 ENCOUNTER — Ambulatory Visit (HOSPITAL_BASED_OUTPATIENT_CLINIC_OR_DEPARTMENT_OTHER): Payer: 59

## 2014-11-16 ENCOUNTER — Ambulatory Visit: Payer: 59 | Admitting: Nurse Practitioner

## 2014-11-16 ENCOUNTER — Encounter: Payer: Self-pay | Admitting: *Deleted

## 2014-11-16 ENCOUNTER — Other Ambulatory Visit: Payer: 59

## 2014-11-16 ENCOUNTER — Other Ambulatory Visit (HOSPITAL_BASED_OUTPATIENT_CLINIC_OR_DEPARTMENT_OTHER): Payer: 59

## 2014-11-16 VITALS — BP 146/75 | HR 83 | Temp 98.9°F | Resp 18

## 2014-11-16 DIAGNOSIS — C50411 Malignant neoplasm of upper-outer quadrant of right female breast: Secondary | ICD-10-CM

## 2014-11-16 DIAGNOSIS — Z5111 Encounter for antineoplastic chemotherapy: Secondary | ICD-10-CM | POA: Diagnosis not present

## 2014-11-16 LAB — COMPREHENSIVE METABOLIC PANEL (CC13)
ALT: 16 U/L (ref 0–55)
ANION GAP: 9 meq/L (ref 3–11)
AST: 15 U/L (ref 5–34)
Albumin: 3.8 g/dL (ref 3.5–5.0)
Alkaline Phosphatase: 63 U/L (ref 40–150)
BUN: 9.9 mg/dL (ref 7.0–26.0)
CALCIUM: 9.4 mg/dL (ref 8.4–10.4)
CHLORIDE: 106 meq/L (ref 98–109)
CO2: 24 meq/L (ref 22–29)
Creatinine: 0.7 mg/dL (ref 0.6–1.1)
Glucose: 133 mg/dl (ref 70–140)
POTASSIUM: 4.6 meq/L (ref 3.5–5.1)
Sodium: 139 mEq/L (ref 136–145)
Total Bilirubin: 0.56 mg/dL (ref 0.20–1.20)
Total Protein: 6.3 g/dL — ABNORMAL LOW (ref 6.4–8.3)

## 2014-11-16 LAB — CBC WITH DIFFERENTIAL/PLATELET
BASO%: 0.5 % (ref 0.0–2.0)
Basophils Absolute: 0 10*3/uL (ref 0.0–0.1)
EOS%: 0.8 % (ref 0.0–7.0)
Eosinophils Absolute: 0 10*3/uL (ref 0.0–0.5)
HEMATOCRIT: 32.1 % — AB (ref 34.8–46.6)
HGB: 10.9 g/dL — ABNORMAL LOW (ref 11.6–15.9)
LYMPH#: 0.5 10*3/uL — AB (ref 0.9–3.3)
LYMPH%: 13.2 % — ABNORMAL LOW (ref 14.0–49.7)
MCH: 31.4 pg (ref 25.1–34.0)
MCHC: 34 g/dL (ref 31.5–36.0)
MCV: 92.5 fL (ref 79.5–101.0)
MONO#: 0.3 10*3/uL (ref 0.1–0.9)
MONO%: 8.6 % (ref 0.0–14.0)
NEUT#: 2.9 10*3/uL (ref 1.5–6.5)
NEUT%: 76.9 % — ABNORMAL HIGH (ref 38.4–76.8)
Platelets: 166 10*3/uL (ref 145–400)
RBC: 3.47 10*6/uL — AB (ref 3.70–5.45)
RDW: 15.4 % — ABNORMAL HIGH (ref 11.2–14.5)
WBC: 3.7 10*3/uL — AB (ref 3.9–10.3)

## 2014-11-16 MED ORDER — SODIUM CHLORIDE 0.9 % IV SOLN
Freq: Once | INTRAVENOUS | Status: AC
  Administered 2014-11-16: 10:00:00 via INTRAVENOUS
  Filled 2014-11-16: qty 4

## 2014-11-16 MED ORDER — SODIUM CHLORIDE 0.9 % IV SOLN
Freq: Once | INTRAVENOUS | Status: AC
  Administered 2014-11-16: 10:00:00 via INTRAVENOUS

## 2014-11-16 MED ORDER — DIPHENHYDRAMINE HCL 50 MG/ML IJ SOLN
INTRAMUSCULAR | Status: AC
  Filled 2014-11-16: qty 1

## 2014-11-16 MED ORDER — HEPARIN SOD (PORK) LOCK FLUSH 100 UNIT/ML IV SOLN
500.0000 [IU] | Freq: Once | INTRAVENOUS | Status: AC | PRN
  Administered 2014-11-16: 500 [IU]
  Filled 2014-11-16: qty 5

## 2014-11-16 MED ORDER — SODIUM CHLORIDE 0.9 % IJ SOLN
10.0000 mL | INTRAMUSCULAR | Status: DC | PRN
  Administered 2014-11-16: 10 mL
  Filled 2014-11-16: qty 10

## 2014-11-16 MED ORDER — PACLITAXEL CHEMO INJECTION 300 MG/50ML
80.0000 mg/m2 | Freq: Once | INTRAVENOUS | Status: AC
  Administered 2014-11-16: 144 mg via INTRAVENOUS
  Filled 2014-11-16: qty 24

## 2014-11-16 MED ORDER — FAMOTIDINE IN NACL 20-0.9 MG/50ML-% IV SOLN
20.0000 mg | Freq: Once | INTRAVENOUS | Status: AC
  Administered 2014-11-16: 20 mg via INTRAVENOUS

## 2014-11-16 MED ORDER — DIPHENHYDRAMINE HCL 50 MG/ML IJ SOLN
25.0000 mg | Freq: Once | INTRAMUSCULAR | Status: AC
  Administered 2014-11-16: 25 mg via INTRAVENOUS

## 2014-11-16 MED ORDER — FAMOTIDINE IN NACL 20-0.9 MG/50ML-% IV SOLN
INTRAVENOUS | Status: AC
  Filled 2014-11-16: qty 50

## 2014-11-16 NOTE — Patient Instructions (Signed)
[facility] Discharge Instructions for Patients Receiving Chemotherapy  Today you received the following chemotherapy agents Taxol   To help prevent nausea and vomiting after your treatment, we encourage you to take your nausea medication as directed.   If you develop nausea and vomiting that is not controlled by your nausea medication, call the clinic.   BELOW ARE SYMPTOMS THAT SHOULD BE REPORTED IMMEDIATELY:  *FEVER GREATER THAN 100.5 F  *CHILLS WITH OR WITHOUT FEVER  NAUSEA AND VOMITING THAT IS NOT CONTROLLED WITH YOUR NAUSEA MEDICATION  *UNUSUAL SHORTNESS OF BREATH  *UNUSUAL BRUISING OR BLEEDING  TENDERNESS IN MOUTH AND THROAT WITH OR WITHOUT PRESENCE OF ULCERS  *URINARY PROBLEMS  *BOWEL PROBLEMS  UNUSUAL RASH Items with * indicate a potential emergency and should be followed up as soon as possible.  Feel free to call the clinic you have any questions or concerns. The clinic phone number is [phone].  Please show the CHEMO ALERT CARD at check-in to the Emergency Department and triage nurse.   

## 2014-11-17 ENCOUNTER — Encounter: Payer: Self-pay | Admitting: Oncology

## 2014-11-17 NOTE — Progress Notes (Signed)
Pt came in stating spouse's insurance company was going to change in October to [place] my card and also information about different foundations that may assist with cost for Breast([employer], and [employer] in [place].

## 2014-11-23 ENCOUNTER — Ambulatory Visit (HOSPITAL_BASED_OUTPATIENT_CLINIC_OR_DEPARTMENT_OTHER): Payer: 59 | Admitting: Nurse Practitioner

## 2014-11-23 ENCOUNTER — Ambulatory Visit (HOSPITAL_BASED_OUTPATIENT_CLINIC_OR_DEPARTMENT_OTHER): Payer: 59

## 2014-11-23 ENCOUNTER — Other Ambulatory Visit (HOSPITAL_BASED_OUTPATIENT_CLINIC_OR_DEPARTMENT_OTHER): Payer: 59

## 2014-11-23 ENCOUNTER — Encounter: Payer: Self-pay | Admitting: Nurse Practitioner

## 2014-11-23 ENCOUNTER — Other Ambulatory Visit: Payer: Self-pay | Admitting: Oncology

## 2014-11-23 VITALS — BP 143/72 | HR 65 | Temp 98.9°F | Resp 18 | Ht 63.0 in | Wt 157.7 lb

## 2014-11-23 DIAGNOSIS — C50411 Malignant neoplasm of upper-outer quadrant of right female breast: Secondary | ICD-10-CM

## 2014-11-23 DIAGNOSIS — Z170 Estrogen receptor positive status [ER+]: Secondary | ICD-10-CM | POA: Diagnosis not present

## 2014-11-23 DIAGNOSIS — Z5111 Encounter for antineoplastic chemotherapy: Secondary | ICD-10-CM

## 2014-11-23 LAB — CBC WITH DIFFERENTIAL/PLATELET
BASO%: 0.4 % (ref 0.0–2.0)
Basophils Absolute: 0 10*3/uL (ref 0.0–0.1)
EOS ABS: 0.1 10*3/uL (ref 0.0–0.5)
EOS%: 2.1 % (ref 0.0–7.0)
HCT: 31.7 % — ABNORMAL LOW (ref 34.8–46.6)
HEMOGLOBIN: 10.8 g/dL — AB (ref 11.6–15.9)
LYMPH%: 10.7 % — ABNORMAL LOW (ref 14.0–49.7)
MCH: 32.2 pg (ref 25.1–34.0)
MCHC: 34.1 g/dL (ref 31.5–36.0)
MCV: 94.2 fL (ref 79.5–101.0)
MONO#: 0.4 10*3/uL (ref 0.1–0.9)
MONO%: 8.7 % (ref 0.0–14.0)
NEUT%: 78.1 % — ABNORMAL HIGH (ref 38.4–76.8)
NEUTROS ABS: 3.2 10*3/uL (ref 1.5–6.5)
Platelets: 195 10*3/uL (ref 145–400)
RBC: 3.36 10*6/uL — ABNORMAL LOW (ref 3.70–5.45)
RDW: 15.9 % — AB (ref 11.2–14.5)
WBC: 4.2 10*3/uL (ref 3.9–10.3)
lymph#: 0.4 10*3/uL — ABNORMAL LOW (ref 0.9–3.3)

## 2014-11-23 LAB — COMPREHENSIVE METABOLIC PANEL (CC13)
ALBUMIN: 3.6 g/dL (ref 3.5–5.0)
ALK PHOS: 55 U/L (ref 40–150)
ALT: 16 U/L (ref 0–55)
AST: 18 U/L (ref 5–34)
Anion Gap: 10 mEq/L (ref 3–11)
BILIRUBIN TOTAL: 0.54 mg/dL (ref 0.20–1.20)
BUN: 15.3 mg/dL (ref 7.0–26.0)
CO2: 23 mEq/L (ref 22–29)
Calcium: 9.3 mg/dL (ref 8.4–10.4)
Chloride: 109 mEq/L (ref 98–109)
Creatinine: 0.6 mg/dL (ref 0.6–1.1)
EGFR: >90 mL/min/{1.73_m2} (ref >90)
GLUCOSE: 124 mg/dL (ref 70–140)
Potassium: 4.4 mEq/L (ref 3.5–5.1)
SODIUM: 143 meq/L (ref 136–145)
TOTAL PROTEIN: 6 g/dL — AB (ref 6.4–8.3)

## 2014-11-23 MED ORDER — DIPHENHYDRAMINE HCL 50 MG/ML IJ SOLN
25.0000 mg | Freq: Once | INTRAMUSCULAR | Status: AC
  Administered 2014-11-23: 25 mg via INTRAVENOUS

## 2014-11-23 MED ORDER — FAMOTIDINE IN NACL 20-0.9 MG/50ML-% IV SOLN
INTRAVENOUS | Status: AC
  Filled 2014-11-23: qty 50

## 2014-11-23 MED ORDER — DIPHENHYDRAMINE HCL 50 MG/ML IJ SOLN
INTRAMUSCULAR | Status: AC
  Filled 2014-11-23: qty 1

## 2014-11-23 MED ORDER — FAMOTIDINE IN NACL 20-0.9 MG/50ML-% IV SOLN
20.0000 mg | Freq: Once | INTRAVENOUS | Status: AC
  Administered 2014-11-23: 20 mg via INTRAVENOUS

## 2014-11-23 MED ORDER — SODIUM CHLORIDE 0.9 % IV SOLN
Freq: Once | INTRAVENOUS | Status: AC
  Administered 2014-11-23: 10:00:00 via INTRAVENOUS
  Filled 2014-11-23: qty 4

## 2014-11-23 MED ORDER — SODIUM CHLORIDE 0.9 % IV SOLN
Freq: Once | INTRAVENOUS | Status: AC
  Administered 2014-11-23: 10:00:00 via INTRAVENOUS

## 2014-11-23 MED ORDER — PACLITAXEL CHEMO INJECTION 300 MG/50ML
80.0000 mg/m2 | Freq: Once | INTRAVENOUS | Status: AC
  Administered 2014-11-23: 144 mg via INTRAVENOUS
  Filled 2014-11-23: qty 24

## 2014-11-23 MED ORDER — HEPARIN SOD (PORK) LOCK FLUSH 100 UNIT/ML IV SOLN
500.0000 [IU] | Freq: Once | INTRAVENOUS | Status: AC | PRN
  Administered 2014-11-23: 500 [IU]
  Filled 2014-11-23: qty 5

## 2014-11-23 MED ORDER — SODIUM CHLORIDE 0.9 % IJ SOLN
10.0000 mL | INTRAMUSCULAR | Status: DC | PRN
  Administered 2014-11-23: 10 mL
  Filled 2014-11-23: qty 10

## 2014-11-23 NOTE — Progress Notes (Signed)
Darlene Kelly  Telephone:(336) [phone] Fax:(336) [phone]     ID: Darlene Kelly DOB: Feb 27, 1951  MR#: [id number]  BLT#:[id number]  Patient Care Team: [name], MD as PCP - General (Family Medicine) [name], MD as Consulting Physician (General Surgery) [name], MD as Consulting Physician (Oncology) [name], MD as Consulting Physician (Radiation Oncology) [name], RN as Registered Nurse [name], RN as Registered Nurse [name], MD as Referring Physician (Cardiology) [name], MD as Referring Physician (Dermatology) [name], NP as Nurse Practitioner (Nurse Practitioner) PCP: [name], MD OTHER MD:  CHIEF COMPLAINT: Estrogen receptor positive breast cancer  CURRENT TREATMENT: Neoadjuvant chemotherapy  BREAST CANCER HISTORY: From the original intake note:  Darlene Kelly first noted a change in her right breast September 2015. At that time however she was consumed by the care of her mother-in-law, who had severe Alzheimer's disease. She finally passed in January 2016, but Darlene Kelly did not share the information regarding the right breast with her husband until late March 2016. At that point she was set up for bilateral diagnostic mammography with tomography at the breast Center, for 11/20/2014. This found the breast density to be category B. In the upper outer quadrant of the right breast there was a 4 cm irregular spiculated mass. There were no other findings of concern. The mass was palpable and firm, and by ultrasound measured 3.3 cm. The right axilla was negative sonographically.  Biopsy of the right breast mass in question was obtained for 03/22/2015. 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  On 07/20/2014 the patient underwent bilateral breast  MRI. This measured the large irregular enhancing mass in the right breast at the 10:00 position at 5.0 cm. There were no additional worrisome findings in the right breast, and no findings in the left breast or any abnormal appearing lymph nodes.  The patient's subsequent history is as detailed below  INTERVAL HISTORY: Darlene Kelly returns today for follow up of her breast cancer. Today she is due for cycle 6 of 12 planned weekly cycles of paclitaxel.   REVIEW OF SYSTEMS: Aerial journals every treatment week, and has noticed she has generalized body aches every week during the 3rd-4th day after treatment. She takes ibuprofen for 2 days then this resolves on its own. Otherwise she tolerates the paclitaxel well with no complaints. She denies fevers, chills, nausea, vomiting, or changes in bowel or bladder habits. She has no mouth sores, rashes, or neuropathy symptoms. Her energy Kelly is excellent. She is up to walking 1.5 miles daily for exercise.  A detailed review of systems is otherwise stable.   PAST MEDICAL HISTORY: Past Medical History  Diagnosis Date  . Breast cancer of upper-outer quadrant of right female breast 07/16/2014  . Diabetes mellitus without complication   . Hypertension   . Skin cancer   . Heart murmur     PAST SURGICAL HISTORY: Past Surgical History  Procedure Laterality Date  . Portacath placement N/A 08/03/2014    Procedure: INSERTION PORT-A-CATH;  Surgeon: [name], MD;  Location: [facility];  Service: General;  Laterality: N/A;    FAMILY HISTORY Family History  Problem Relation Age of Onset  . Lung cancer Father    the patient's father died from lung cancer in the setting of tobacco abuse at the age of 16. The patient's mother died  from COPD at the age of 36. The patient has 3 brothers, no sisters. There is no history of breast or ovarian cancer in the family.  GYNECOLOGIC HISTORY:  No LMP recorded. Patient is postmenopausal. Menarche age 89, first live birth age  66. She is GX P3. She went through the change of life in her late 43s. She did not take hormone replacement.  SOCIAL HISTORY:  Darlene Kelly worked for [employer], but is now retired. Her husband Darlene Kelly is Programmer, systems for a [employer]. They have been married more than 40 years. Son Darlene Kelly is a climbing Acupuncturist in [place]. Son Darlene Kelly is a Dealer in [place]. 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 The patient has 3 grandchildren.    ADVANCED DIRECTIVES: In place   HEALTH MAINTENANCE: Social History  Substance Use Topics  . Smoking status: Former Smoker -- 0.50 packs/day for 12 years    Types: Cigarettes    Quit date: 04/02/1986  . Smokeless tobacco: Never Used  . Alcohol Use: 1.8 oz/week    3 Glasses of wine per week     Colonoscopy: Never  PAP:  Bone density: Never  Lipid panel:  Allergies  Allergen Reactions  . Latex Itching, Dermatitis and Rash  . Lipitor [Atorvastatin] Other (See Comments)    Bad leg cramps    Current Outpatient Prescriptions  Medication Sig Dispense Refill  . aspirin EC 81 MG tablet Take 81 mg by mouth at bedtime.     Marland Kitchen ibuprofen (ADVIL,MOTRIN) 200 MG tablet Take 400 mg by mouth every 6 (six) hours as needed for headache or moderate pain.    Marland Kitchen lidocaine-prilocaine (EMLA) cream Apply topically as needed. Use as directed on port site 1 hour prior to IV access 1 each 3  . lisinopril (PRINIVIL,ZESTRIL) 10 MG tablet Take 10 mg by mouth at bedtime.   6  . MELATONIN PO Take 5 mg by mouth at bedtime.     . metFORMIN (GLUCOPHAGE) 1000 MG tablet Take 1,000 mg by mouth 2 (two) times daily with a meal.    . metoprolol succinate (TOPROL-XL) 100 MG 24 hr tablet Take 100 mg by mouth every evening.     . Omega-3 Fatty Acids (FISH OIL PO) Take 1 capsule by mouth every evening.     . rosuvastatin (CRESTOR) 40 MG tablet Take by mouth daily. Pt takes 1/2  tablet(75m) on Mondays, Wednesdays, Fridays.    . cetirizine (ZYRTEC) 10 MG tablet Take 10 mg by mouth daily as needed for allergies.    .Marland KitchenHYDROcodone-homatropine (HYCODAN) 5-1.5 MG/5ML syrup Take 5 mLs by mouth every 6 (six) hours as needed for cough. (Patient not taking: Reported on 10/26/2014) 120 mL 0  . prochlorperazine (COMPAZINE) 10 MG tablet Take 1 tablet (10 mg total) by mouth every 6 (six) hours as needed (Nausea or vomiting). (Patient not taking: Reported on 10/26/2014) 30 tablet 1   No current facility-administered medications for this visit.    OBJECTIVE: Middle-aged white woman who appears stated age F10Vitals:   11/23/14 0829  BP: 143/72  Pulse: 65  Temp: 98.9 F (37.2 C)  Resp: 18     Body mass index is 27.94 kg/(m^2).    ECOG FS:1 - Symptomatic but completely ambulatory  Skin: warm, dry  HEENT: sclerae anicteric, conjunctivae pink, oropharynx clear. No thrush or mucositis.  Lymph Nodes: No cervical or supraclavicular lymphadenopathy  Lungs:  clear to auscultation bilaterally, no rales, wheezes, or rhonci  Heart: regular rate and rhythm  Abdomen: round, soft, non tender, positive bowel sounds  Musculoskeletal: No focal spinal tenderness, no peripheral edema  Neuro: non focal, well oriented, positive affect  Breasts: deferred  LAB RESULTS:  CMP     Component Value Date/Time   NA 143 11/23/2014 0754   K 4.4 11/23/2014 0754   CO2 23 11/23/2014 0754   GLUCOSE 124 11/23/2014 0754   BUN 15.3 11/23/2014 0754   CREATININE 0.6 11/23/2014 0754   CALCIUM 9.3 11/23/2014 0754   PROT 6.0* 11/23/2014 0754   ALBUMIN 3.6 11/23/2014 0754   AST 18 11/23/2014 0754   ALT 16 11/23/2014 0754   ALKPHOS 55 11/23/2014 0754   BILITOT 0.54 11/23/2014 0754    INo results found for: SPEP, UPEP  Lab Results  Component Value Date   WBC 4.2 11/23/2014   NEUTROABS 3.2 11/23/2014   HGB 10.8* 11/23/2014   HCT 31.7* 11/23/2014   MCV 94.2 11/23/2014   PLT 195 11/23/2014       Chemistry      Component Value Date/Time   NA 143 11/23/2014 0754   K 4.4 11/23/2014 0754   CO2 23 11/23/2014 0754   BUN 15.3 11/23/2014 0754   CREATININE 0.6 11/23/2014 0754      Component Value Date/Time   CALCIUM 9.3 11/23/2014 0754   ALKPHOS 55 11/23/2014 0754   AST 18 11/23/2014 0754   ALT 16 11/23/2014 0754   BILITOT 0.54 11/23/2014 0754       No results found for: LABCA2  No components found for: LABCA125  No results for input(s): INR in the last 168 hours.  Urinalysis No results found for: COLORURINE, APPEARANCEUR, LABSPEC, PHURINE, GLUCOSEU, HGBUR, BILIRUBINUR, KETONESUR, PROTEINUR, UROBILINOGEN, NITRITE, LEUKOCYTESUR  STUDIES: No results found.  ASSESSMENT: 64 y.o. Darlene Kelly woman status post right breast upper outer quadrant biopsy 07/13/2014, for a clinical T3 N0, stage IIB invasive ductal carcinoma, grade 3, estrogen receptor positive, HER-2 and progesterone receptor negative, with an MIB-1 of 40%  (1) neoadjuvant chemotherapy starting 08/15/2013, consisting of doxorubicin and cyclophosphamide in dose dense fashion 4, completed 09/28/2014, followed by weekly paclitaxel 12 starting 10/19/2014  (2) breast conserving surgery with sentinel lymph node sampling to follow chemotherapy  (3) adjuvant radiation to follow surgery  (4) anti-estrogens to follow radiation  PLAN: Berdena is doing well today. The labs were reviewed in detail and are entirely stable. She will proceed with cycle 6 of paclitaxel today as planned.  She will continue to use ibuprofen PRN for her body aches 3-4 days after chemotherapy. This is obviously related to her taxane therapy.   Shemiah will return in 1 week for cycle 7 of treatment. She understands and agrees with this plan. She knows the goal of treatment in her case is cure. She has been encouraged to call with any issues that might arise before her next visit here.   Laurie Panda, NP   11/23/2014 9:20 AM

## 2014-11-23 NOTE — Patient Instructions (Signed)
[facility] Discharge Instructions for Patients Receiving Chemotherapy  Today you received the following chemotherapy agents: Taxol.  To help prevent nausea and vomiting after your treatment, we encourage you to take your nausea medication: Compazine. Take one every 6 hours as needed.   If you develop nausea and vomiting that is not controlled by your nausea medication, call the clinic.   BELOW ARE SYMPTOMS THAT SHOULD BE REPORTED IMMEDIATELY:  *FEVER GREATER THAN 100.5 F  *CHILLS WITH OR WITHOUT FEVER  NAUSEA AND VOMITING THAT IS NOT CONTROLLED WITH YOUR NAUSEA MEDICATION  *UNUSUAL SHORTNESS OF BREATH  *UNUSUAL BRUISING OR BLEEDING  TENDERNESS IN MOUTH AND THROAT WITH OR WITHOUT PRESENCE OF ULCERS  *URINARY PROBLEMS  *BOWEL PROBLEMS  UNUSUAL RASH Items with * indicate a potential emergency and should be followed up as soon as possible.  Feel free to call the clinic should you have any questions or concerns. The clinic phone number is [phone].  Please show the CHEMO ALERT CARD at check-in to the Emergency Department and triage nurse.   

## 2014-11-30 ENCOUNTER — Other Ambulatory Visit (HOSPITAL_BASED_OUTPATIENT_CLINIC_OR_DEPARTMENT_OTHER): Payer: 59

## 2014-11-30 ENCOUNTER — Ambulatory Visit (HOSPITAL_BASED_OUTPATIENT_CLINIC_OR_DEPARTMENT_OTHER): Payer: 59 | Admitting: Nurse Practitioner

## 2014-11-30 ENCOUNTER — Encounter: Payer: Self-pay | Admitting: *Deleted

## 2014-11-30 ENCOUNTER — Encounter: Payer: Self-pay | Admitting: Nurse Practitioner

## 2014-11-30 ENCOUNTER — Other Ambulatory Visit: Payer: 59

## 2014-11-30 ENCOUNTER — Ambulatory Visit: Payer: 59

## 2014-11-30 ENCOUNTER — Ambulatory Visit (HOSPITAL_BASED_OUTPATIENT_CLINIC_OR_DEPARTMENT_OTHER): Payer: 59

## 2014-11-30 VITALS — BP 125/66 | HR 72 | Temp 98.6°F | Resp 18 | Ht 63.0 in | Wt 156.7 lb

## 2014-11-30 DIAGNOSIS — Z5111 Encounter for antineoplastic chemotherapy: Secondary | ICD-10-CM

## 2014-11-30 DIAGNOSIS — C50411 Malignant neoplasm of upper-outer quadrant of right female breast: Secondary | ICD-10-CM | POA: Diagnosis not present

## 2014-11-30 DIAGNOSIS — Z170 Estrogen receptor positive status [ER+]: Secondary | ICD-10-CM | POA: Diagnosis not present

## 2014-11-30 LAB — CBC WITH DIFFERENTIAL/PLATELET
BASO%: 0.4 % (ref 0.0–2.0)
BASOS ABS: 0 10*3/uL (ref 0.0–0.1)
EOS%: 1.1 % (ref 0.0–7.0)
Eosinophils Absolute: 0.1 10*3/uL (ref 0.0–0.5)
HEMATOCRIT: 33.3 % — AB (ref 34.8–46.6)
HEMOGLOBIN: 11.4 g/dL — AB (ref 11.6–15.9)
LYMPH#: 0.5 10*3/uL — AB (ref 0.9–3.3)
LYMPH%: 11.7 % — AB (ref 14.0–49.7)
MCH: 31.6 pg (ref 25.1–34.0)
MCHC: 34.2 g/dL (ref 31.5–36.0)
MCV: 92.2 fL (ref 79.5–101.0)
MONO#: 0.5 10*3/uL (ref 0.1–0.9)
MONO%: 10.6 % (ref 0.0–14.0)
NEUT#: 3.4 10*3/uL (ref 1.5–6.5)
NEUT%: 76.2 % (ref 38.4–76.8)
PLATELETS: 209 10*3/uL (ref 145–400)
RBC: 3.61 10*6/uL — ABNORMAL LOW (ref 3.70–5.45)
RDW: 15 % — ABNORMAL HIGH (ref 11.2–14.5)
WBC: 4.5 10*3/uL (ref 3.9–10.3)
nRBC: 1 % — ABNORMAL HIGH (ref 0–0)

## 2014-11-30 LAB — COMPREHENSIVE METABOLIC PANEL (CC13)
ALBUMIN: 3.8 g/dL (ref 3.5–5.0)
ALK PHOS: 57 U/L (ref 40–150)
ALT: 16 U/L (ref 0–55)
ANION GAP: 9 meq/L (ref 3–11)
AST: 17 U/L (ref 5–34)
BUN: 13.3 mg/dL (ref 7.0–26.0)
CALCIUM: 9.5 mg/dL (ref 8.4–10.4)
CHLORIDE: 107 meq/L (ref 98–109)
CO2: 23 mEq/L (ref 22–29)
Creatinine: 0.7 mg/dL (ref 0.6–1.1)
Glucose: 129 mg/dl (ref 70–140)
POTASSIUM: 4.7 meq/L (ref 3.5–5.1)
Sodium: 139 mEq/L (ref 136–145)
Total Bilirubin: 0.6 mg/dL (ref 0.20–1.20)
Total Protein: 6.3 g/dL — ABNORMAL LOW (ref 6.4–8.3)

## 2014-11-30 MED ORDER — FAMOTIDINE IN NACL 20-0.9 MG/50ML-% IV SOLN
20.0000 mg | Freq: Once | INTRAVENOUS | Status: AC
  Administered 2014-11-30: 20 mg via INTRAVENOUS

## 2014-11-30 MED ORDER — DIPHENHYDRAMINE HCL 50 MG/ML IJ SOLN
25.0000 mg | Freq: Once | INTRAMUSCULAR | Status: AC
  Administered 2014-11-30: 25 mg via INTRAVENOUS

## 2014-11-30 MED ORDER — SODIUM CHLORIDE 0.9 % IV SOLN
Freq: Once | INTRAVENOUS | Status: AC
Start: 2014-11-30 — End: 2014-11-30
  Administered 2014-11-30: 10:00:00 via INTRAVENOUS
  Filled 2014-11-30: qty 4

## 2014-11-30 MED ORDER — SODIUM CHLORIDE 0.9 % IJ SOLN
10.0000 mL | INTRAMUSCULAR | Status: DC | PRN
  Administered 2014-11-30: 10 mL
  Filled 2014-11-30: qty 10

## 2014-11-30 MED ORDER — FAMOTIDINE IN NACL 20-0.9 MG/50ML-% IV SOLN
INTRAVENOUS | Status: AC
  Filled 2014-11-30: qty 50

## 2014-11-30 MED ORDER — SODIUM CHLORIDE 0.9 % IV SOLN
Freq: Once | INTRAVENOUS | Status: AC
  Administered 2014-11-30: 10:00:00 via INTRAVENOUS

## 2014-11-30 MED ORDER — PACLITAXEL CHEMO INJECTION 300 MG/50ML
80.0000 mg/m2 | Freq: Once | INTRAVENOUS | Status: AC
  Administered 2014-11-30: 144 mg via INTRAVENOUS
  Filled 2014-11-30: qty 24

## 2014-11-30 MED ORDER — HEPARIN SOD (PORK) LOCK FLUSH 100 UNIT/ML IV SOLN
500.0000 [IU] | Freq: Once | INTRAVENOUS | Status: AC | PRN
  Administered 2014-11-30: 500 [IU]
  Filled 2014-11-30: qty 5

## 2014-11-30 MED ORDER — DIPHENHYDRAMINE HCL 50 MG/ML IJ SOLN
INTRAMUSCULAR | Status: AC
  Filled 2014-11-30: qty 1

## 2014-11-30 NOTE — Patient Instructions (Addendum)
[facility] Discharge Instructions for Patients Receiving Chemotherapy  Today you received the following chemotherapy agents: Taxol.  To help prevent nausea and vomiting after your treatment, we encourage you to take your nausea medication : Compazine 10 mg every 6 hours as needed.   If you develop nausea and vomiting that is not controlled by your nausea medication, call the clinic.   BELOW ARE SYMPTOMS THAT SHOULD BE REPORTED IMMEDIATELY:  *FEVER GREATER THAN 100.5 F  *CHILLS WITH OR WITHOUT FEVER  NAUSEA AND VOMITING THAT IS NOT CONTROLLED WITH YOUR NAUSEA MEDICATION  *UNUSUAL SHORTNESS OF BREATH  *UNUSUAL BRUISING OR BLEEDING  TENDERNESS IN MOUTH AND THROAT WITH OR WITHOUT PRESENCE OF ULCERS  *URINARY PROBLEMS  *BOWEL PROBLEMS  UNUSUAL RASH Items with * indicate a potential emergency and should be followed up as soon as possible.  Feel free to call the clinic you have any questions or concerns. The clinic phone number is [phone].  Please show the CHEMO ALERT CARD at check-in to the Emergency Department and triage nurse.   

## 2014-11-30 NOTE — Progress Notes (Signed)
[place]  [phone] Fax:(336) [phone]     ID: Darlene Kelly DOB: 01-09-1951  MR#: [id number]  FXJ#:[id number]  Patient Care Team: [name], MD as PCP - General (Family Medicine) [name], MD as Consulting Physician (General Surgery) [name], MD as Consulting Physician (Oncology) [name], MD as Consulting Physician (Radiation Oncology) [name], RN as Registered Nurse [name], RN as Registered Nurse [name], MD as Referring Physician (Cardiology) [name], MD as Referring Physician (Dermatology) [name], NP as Nurse Practitioner (Nurse Practitioner) PCP: [name], MD OTHER MD:  CHIEF COMPLAINT: Estrogen receptor positive breast cancer  CURRENT TREATMENT: Neoadjuvant chemotherapy  BREAST CANCER HISTORY: From the original intake note:  Deborrah first noted a change in her right breast September 2015. At that time however she was consumed by the care of her mother-in-law, who had severe Alzheimer's disease. She finally passed in January 2016, but Justise did not share the information regarding the right breast with her husband until late March 2016. At that point she was set up for bilateral diagnostic mammography with tomography at the breast Center, for 11/20/2014. This found the breast density to be category B. In the upper outer quadrant of the right breast there was a 4 cm irregular spiculated mass. There were no other findings of concern. The mass was palpable and firm, and by ultrasound measured 3.3 cm. The right axilla was negative sonographically.  Biopsy of the right breast mass in question was obtained for 03/22/2015. 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  On 07/20/2014 the patient underwent bilateral breast  MRI. This measured the large irregular enhancing mass in the right breast at the 10:00 position at 5.0 cm. There were no additional worrisome findings in the right breast, and no findings in the left breast or any abnormal appearing lymph nodes.  The patient's subsequent history is as detailed below  INTERVAL HISTORY: Darlene Kelly returns today for follow up of her breast cancer. Today she is due for cycle 7 of 12 planned weekly cycles of paclitaxel.   REVIEW OF SYSTEMS: This week during the 3-4 days after treatment where she has the body aches, she also had 4 episodes of loose stools which is uncommon for her. She believes this is more related to something she ate. She did not take any imodium as it resolved on its own. She denies fevers, chills, nausea or vomiting. She is eating and drinking well. Her sleep has been on and off lately and her legs have been restless. She is battling clear sinus drainage with claritin daily.  A detailed review of systems is otherwise stable.   PAST MEDICAL HISTORY: Past Medical History  Diagnosis Date  . Breast cancer of upper-outer quadrant of right female breast 07/16/2014  . Diabetes mellitus without complication   . Hypertension   . Skin cancer   . Heart murmur     PAST SURGICAL HISTORY: Past Surgical History  Procedure Laterality Date  . Portacath placement N/A 08/03/2014    Procedure: INSERTION PORT-A-CATH;  Surgeon: [name], MD;  Location: [facility];  Service: General;  Laterality: N/A;    FAMILY HISTORY Family History  Problem Relation Age of Onset  . Lung cancer Father    the patient's father died from lung cancer in the setting of tobacco abuse at the age  of 41. The patient's mother died from COPD at the age of 61. The patient has 3 brothers, no sisters. There is no history of breast or ovarian cancer in the family.  GYNECOLOGIC HISTORY:  No LMP recorded. Patient is postmenopausal. Menarche age 86, first live birth age 37. She is GX P3. She  went through the change of life in her late 77s. She did not take hormone replacement.  SOCIAL HISTORY:  Courteney worked for [employer], but is now retired. Her husband [name] is Programmer, systems for a [employer]. They have been married more than 40 years. Son [name] "[name] was "'s Dulcy Sida is a climbing Acupuncturist in [place]. Son [name] "map" Jaidee Stipe is a Dealer in [place]. Daughter Alizeh Madril is a branch bank or for [employer] in [place]. The patient has 3 grandchildren.    ADVANCED DIRECTIVES: In place   HEALTH MAINTENANCE: Social History  Substance Use Topics  . Smoking status: Former Smoker -- 0.50 packs/day for 12 years    Types: Cigarettes    Quit date: 04/02/1986  . Smokeless tobacco: Never Used  . Alcohol Use: 1.8 oz/week    3 Glasses of wine per week     Colonoscopy: Never  PAP:  Bone density: Never  Lipid panel:  Allergies  Allergen Reactions  . Latex Itching, Dermatitis and Rash  . Lipitor [Atorvastatin] Other (See Comments)    Bad leg cramps    Current Outpatient Prescriptions  Medication Sig Dispense Refill  . aspirin EC 81 MG tablet Take 81 mg by mouth at bedtime.     Marland Kitchen ibuprofen (ADVIL,MOTRIN) 200 MG tablet Take 400 mg by mouth every 6 (six) hours as needed for headache or moderate pain.    Marland Kitchen lidocaine-prilocaine (EMLA) cream Apply topically as needed. Use as directed on port site 1 hour prior to IV access 1 each 3  . lisinopril (PRINIVIL,ZESTRIL) 10 MG tablet Take 10 mg by mouth at bedtime.   6  . MELATONIN PO Take 5 mg by mouth at bedtime.     . metFORMIN (GLUCOPHAGE) 1000 MG tablet Take 1,000 mg by mouth 2 (two) times daily with a meal.    . metoprolol succinate (TOPROL-XL) 100 MG 24 hr tablet Take 100 mg by mouth every evening.     . Omega-3 Fatty Acids (FISH OIL PO) Take 1 capsule by mouth every evening.     . rosuvastatin (CRESTOR) 40 MG tablet Take by mouth daily. Pt takes 1/2 tablet(44m) on Mondays,  Wednesdays, Fridays.    . cetirizine (ZYRTEC) 10 MG tablet Take 10 mg by mouth daily as needed for allergies.    .Marland KitchenHYDROcodone-homatropine (HYCODAN) 5-1.5 MG/5ML syrup Take 5 mLs by mouth every 6 (six) hours as needed for cough. (Patient not taking: Reported on 10/26/2014) 120 mL 0  . prochlorperazine (COMPAZINE) 10 MG tablet Take 1 tablet (10 mg total) by mouth every 6 (six) hours as needed (Nausea or vomiting). (Patient not taking: Reported on 10/26/2014) 30 tablet 1   No current facility-administered medications for this visit.    OBJECTIVE: Middle-aged white woman who appears stated age F89Vitals:   11/30/14 0854  BP: 125/66  Pulse: 72  Temp: 98.6 F (37 C)  Resp: 18     Body mass index is 27.77 kg/(m^2).    ECOG FS:1 - Symptomatic but completely ambulatory  Sclerae unicteric, pupils round and equal Oropharynx clear and moist-- no thrush or other lesions No cervical or supraclavicular  adenopathy Lungs no rales or rhonchi Heart regular rate and rhythm Abd soft, nontender, positive bowel sounds MSK no focal spinal tenderness, no upper extremity lymphedema Neuro: nonfocal, well oriented, appropriate affect Breasts: deferred  LAB RESULTS:  CMP     Component Value Date/Time   NA 143 11/23/2014 0754   K 4.4 11/23/2014 0754   CO2 23 11/23/2014 0754   GLUCOSE 124 11/23/2014 0754   BUN 15.3 11/23/2014 0754   CREATININE 0.6 11/23/2014 0754   CALCIUM 9.3 11/23/2014 0754   PROT 6.0* 11/23/2014 0754   ALBUMIN 3.6 11/23/2014 0754   AST 18 11/23/2014 0754   ALT 16 11/23/2014 0754   ALKPHOS 55 11/23/2014 0754   BILITOT 0.54 11/23/2014 0754    INo results found for: SPEP, UPEP  Lab Results  Component Value Date   WBC 4.5 11/30/2014   NEUTROABS 3.4 11/30/2014   HGB 11.4* 11/30/2014   HCT 33.3* 11/30/2014   MCV 92.2 11/30/2014   PLT 209 11/30/2014      Chemistry      Component Value Date/Time   NA 143 11/23/2014 0754   K 4.4 11/23/2014 0754   CO2 23 11/23/2014 0754     BUN 15.3 11/23/2014 0754   CREATININE 0.6 11/23/2014 0754      Component Value Date/Time   CALCIUM 9.3 11/23/2014 0754   ALKPHOS 55 11/23/2014 0754   AST 18 11/23/2014 0754   ALT 16 11/23/2014 0754   BILITOT 0.54 11/23/2014 0754       No results found for: LABCA2  No components found for: LABCA125  No results for input(s): INR in the last 168 hours.  Urinalysis No results found for: COLORURINE, APPEARANCEUR, LABSPEC, PHURINE, GLUCOSEU, HGBUR, BILIRUBINUR, KETONESUR, PROTEINUR, UROBILINOGEN, NITRITE, LEUKOCYTESUR  STUDIES: No results found.  ASSESSMENT: 64 y.o. [place] woman status post right breast upper outer quadrant biopsy 07/13/2014, for a clinical T3 N0, stage IIB invasive ductal carcinoma, grade 3, estrogen receptor positive, HER-2 and progesterone receptor negative, with an MIB-1 of 40%  (1) neoadjuvant chemotherapy starting 08/15/2013, consisting of doxorubicin and cyclophosphamide in dose dense fashion 4, completed 09/28/2014, followed by weekly paclitaxel 12 starting 10/19/2014  (2) breast conserving surgery with sentinel lymph node sampling to follow chemotherapy  (3) adjuvant radiation to follow surgery  (4) anti-estrogens to follow radiation  PLAN: Luka continues to manage treatment well. The labs were reviewed in detail and are entirely stable. She will proceed with cycle 7 of paclitaxel today as planned.  She will continue to use ibuprofen PRN for her body aches 3-4 days after chemotherapy. This is obviously related to her taxane therapy.   Christen will return in 1 week for cycle 8 of treatment. She understands and agrees with this plan. She knows the goal of treatment in her case is cure. She has been encouraged to call with any issues that might arise before her next visit here.   Laurie Panda, NP   11/30/2014 9:19 AM

## 2014-12-07 ENCOUNTER — Other Ambulatory Visit (HOSPITAL_BASED_OUTPATIENT_CLINIC_OR_DEPARTMENT_OTHER): Payer: 59

## 2014-12-07 ENCOUNTER — Ambulatory Visit (HOSPITAL_BASED_OUTPATIENT_CLINIC_OR_DEPARTMENT_OTHER): Payer: 59

## 2014-12-07 ENCOUNTER — Encounter: Payer: Self-pay | Admitting: Nurse Practitioner

## 2014-12-07 ENCOUNTER — Ambulatory Visit (HOSPITAL_BASED_OUTPATIENT_CLINIC_OR_DEPARTMENT_OTHER): Payer: 59 | Admitting: Nurse Practitioner

## 2014-12-07 ENCOUNTER — Encounter: Payer: Self-pay | Admitting: *Deleted

## 2014-12-07 ENCOUNTER — Telehealth: Payer: Self-pay | Admitting: Nurse Practitioner

## 2014-12-07 VITALS — BP 129/72 | HR 67 | Temp 98.2°F | Resp 18

## 2014-12-07 VITALS — BP 143/74 | HR 76 | Temp 98.9°F | Resp 18 | Ht 63.0 in | Wt 156.8 lb

## 2014-12-07 DIAGNOSIS — Z5111 Encounter for antineoplastic chemotherapy: Secondary | ICD-10-CM | POA: Diagnosis not present

## 2014-12-07 DIAGNOSIS — C50411 Malignant neoplasm of upper-outer quadrant of right female breast: Secondary | ICD-10-CM | POA: Diagnosis not present

## 2014-12-07 DIAGNOSIS — Z170 Estrogen receptor positive status [ER+]: Secondary | ICD-10-CM | POA: Diagnosis not present

## 2014-12-07 LAB — COMPREHENSIVE METABOLIC PANEL (CC13)
ALBUMIN: 3.9 g/dL (ref 3.5–5.0)
ALK PHOS: 51 U/L (ref 40–150)
ALT: 17 U/L (ref 0–55)
ANION GAP: 9 meq/L (ref 3–11)
AST: 18 U/L (ref 5–34)
BILIRUBIN TOTAL: 0.58 mg/dL (ref 0.20–1.20)
BUN: 11.4 mg/dL (ref 7.0–26.0)
CALCIUM: 9.6 mg/dL (ref 8.4–10.4)
CO2: 26 mEq/L (ref 22–29)
CREATININE: 0.7 mg/dL (ref 0.6–1.1)
Chloride: 108 mEq/L (ref 98–109)
Glucose: 132 mg/dl (ref 70–140)
Potassium: 4.5 mEq/L (ref 3.5–5.1)
Sodium: 142 mEq/L (ref 136–145)
TOTAL PROTEIN: 6.4 g/dL (ref 6.4–8.3)

## 2014-12-07 LAB — CBC WITH DIFFERENTIAL/PLATELET
BASO%: 0.5 % (ref 0.0–2.0)
Basophils Absolute: 0 10*3/uL (ref 0.0–0.1)
EOS ABS: 0 10*3/uL (ref 0.0–0.5)
EOS%: 1 % (ref 0.0–7.0)
HEMATOCRIT: 33.4 % — AB (ref 34.8–46.6)
HGB: 11.5 g/dL — ABNORMAL LOW (ref 11.6–15.9)
LYMPH#: 0.5 10*3/uL — AB (ref 0.9–3.3)
LYMPH%: 13.4 % — ABNORMAL LOW (ref 14.0–49.7)
MCH: 32.2 pg (ref 25.1–34.0)
MCHC: 34.4 g/dL (ref 31.5–36.0)
MCV: 93.8 fL (ref 79.5–101.0)
MONO#: 0.4 10*3/uL (ref 0.1–0.9)
MONO%: 11.2 % (ref 0.0–14.0)
NEUT%: 73.9 % (ref 38.4–76.8)
NEUTROS ABS: 2.7 10*3/uL (ref 1.5–6.5)
PLATELETS: 216 10*3/uL (ref 145–400)
RBC: 3.56 10*6/uL — ABNORMAL LOW (ref 3.70–5.45)
RDW: 16 % — ABNORMAL HIGH (ref 11.2–14.5)
WBC: 3.7 10*3/uL — AB (ref 3.9–10.3)

## 2014-12-07 MED ORDER — SODIUM CHLORIDE 0.9 % IJ SOLN
10.0000 mL | INTRAMUSCULAR | Status: DC | PRN
  Administered 2014-12-07: 10 mL
  Filled 2014-12-07: qty 10

## 2014-12-07 MED ORDER — FAMOTIDINE IN NACL 20-0.9 MG/50ML-% IV SOLN
INTRAVENOUS | Status: AC
Start: 2014-12-07 — End: 2014-12-07
  Filled 2014-12-07: qty 50

## 2014-12-07 MED ORDER — HEPARIN SOD (PORK) LOCK FLUSH 100 UNIT/ML IV SOLN
500.0000 [IU] | Freq: Once | INTRAVENOUS | Status: AC | PRN
  Administered 2014-12-07: 500 [IU]
  Filled 2014-12-07: qty 5

## 2014-12-07 MED ORDER — FAMOTIDINE IN NACL 20-0.9 MG/50ML-% IV SOLN
20.0000 mg | Freq: Once | INTRAVENOUS | Status: AC
  Administered 2014-12-07: 20 mg via INTRAVENOUS

## 2014-12-07 MED ORDER — DIPHENHYDRAMINE HCL 50 MG/ML IJ SOLN
INTRAMUSCULAR | Status: AC
  Filled 2014-12-07: qty 1

## 2014-12-07 MED ORDER — SODIUM CHLORIDE 0.9 % IV SOLN
Freq: Once | INTRAVENOUS | Status: AC
  Administered 2014-12-07: 11:00:00 via INTRAVENOUS
  Filled 2014-12-07: qty 4

## 2014-12-07 MED ORDER — SODIUM CHLORIDE 0.9 % IV SOLN
Freq: Once | INTRAVENOUS | Status: AC
  Administered 2014-12-07: 10:00:00 via INTRAVENOUS

## 2014-12-07 MED ORDER — DIPHENHYDRAMINE HCL 50 MG/ML IJ SOLN
25.0000 mg | Freq: Once | INTRAMUSCULAR | Status: AC
  Administered 2014-12-07: 25 mg via INTRAVENOUS

## 2014-12-07 MED ORDER — DEXTROSE 5 % IV SOLN
80.0000 mg/m2 | Freq: Once | INTRAVENOUS | Status: AC
  Administered 2014-12-07: 144 mg via INTRAVENOUS
  Filled 2014-12-07: qty 24

## 2014-12-07 NOTE — Progress Notes (Signed)
[facility]  [phone]) [phone] Fax:(336) [phone]     ID: Darlene Kelly DOB: 1961-03-22  MR#: [id number]  LMB#:[id number]  Patient Care Team: [name], MD as PCP - General (Family Medicine) [name], MD as Consulting Physician (General Surgery) [name], MD as Consulting Physician (Oncology) [name], MD as Consulting Physician (Radiation Oncology) [name], RN as Registered Nurse [name], RN as Registered Nurse [name], MD as Referring Physician (Cardiology) [name], MD as Referring Physician (Dermatology) [name], NP as Nurse Practitioner (Nurse Practitioner) PCP: [name], MD OTHER MD:  CHIEF COMPLAINT: Estrogen receptor positive breast cancer  CURRENT TREATMENT: Neoadjuvant chemotherapy  BREAST CANCER HISTORY: From the original intake note:  Elias first noted a change in her right breast September 2015. At that time however she was consumed by the care of her mother-in-law, who had severe Alzheimer's disease. She finally passed in January 2016, but Merriel did not share the information regarding the right breast with her husband until late March 2016. At that point she was set up for bilateral diagnostic mammography with tomography at the breast Center, for 11/20/2014. This found the breast density to be category B. In the upper outer quadrant of the right breast there was a 4 cm irregular spiculated mass. There were no other findings of concern. The mass was palpable and firm, and by ultrasound measured 3.3 cm. The right axilla was negative sonographically.  Biopsy of the right breast mass in question was obtained for 03/22/2015. 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  On 07/20/2014 the patient underwent bilateral breast  MRI. This measured the large irregular enhancing mass in the right breast at the 10:00 position at 5.0 cm. There were no additional worrisome findings in the right breast, and no findings in the left breast or any abnormal appearing lymph nodes.  The patient's subsequent history is as detailed below  INTERVAL HISTORY: Geneve returns today for follow up of her breast cancer. Today she is due for cycle 8 of 12 planned weekly cycles of paclitaxel.   REVIEW OF SYSTEMS: Vantasia has no new complaints this week. She continues to have loose stools on day 3. She does not sleep well that evening, or the evening of treatment because the benadryl given as a premed keeps her up via restless legs. Otherwise, she denies complaints. She has no fevers, chills, nausea, or vomiting. Her appetite is good. Her energy levels are decent. A detailed review of systems is entirely otherwise negative.   PAST MEDICAL HISTORY: Past Medical History  Diagnosis Date  . Breast cancer of upper-outer quadrant of right female breast 07/16/2014  . Diabetes mellitus without complication   . Hypertension   . Skin cancer   . Heart murmur     PAST SURGICAL HISTORY: Past Surgical History  Procedure Laterality Date  . Portacath placement N/A 08/03/2014    Procedure: INSERTION PORT-A-CATH;  Surgeon: [name], MD;  Location: [facility];  Service: General;  Laterality: N/A;    FAMILY HISTORY Family History  Problem Relation Age of Onset  . Lung cancer Father    the patient's father died from lung cancer in the setting of tobacco abuse at the age of 36. The patient's mother died from COPD at the age of 63. The patient has 3 brothers, no sisters. There is no  history of breast or ovarian cancer in the family.  GYNECOLOGIC HISTORY:  No LMP recorded. Patient is postmenopausal. Menarche age 89, first live birth age 31. She is GX P3. She went through the change of life in her late 52s. She did not take hormone  replacement.  SOCIAL HISTORY:  Mercy worked for [employer], but is now retired. Her husband [name] is Programmer, systems for a [employer]. They have been married more than 40 years. Son [name] "[name] was "'s Malory Spurr is a climbing Acupuncturist in [place]. Son [name] "map" Journi Moffa is a Dealer in [place]. Daughter De Libman is a branch bank or for [employer] in [place]. The patient has 3 grandchildren.    ADVANCED DIRECTIVES: In place   HEALTH MAINTENANCE: Social History  Substance Use Topics  . Smoking status: Former Smoker -- 0.50 packs/day for 12 years    Types: Cigarettes    Quit date: 04/02/1986  . Smokeless tobacco: Never Used  . Alcohol Use: 1.8 oz/week    3 Glasses of wine per week     Colonoscopy: Never  PAP:  Bone density: Never  Lipid panel:  Allergies  Allergen Reactions  . Latex Itching, Dermatitis and Rash  . Lipitor [Atorvastatin] Other (See Comments)    Bad leg cramps    Current Outpatient Prescriptions  Medication Sig Dispense Refill  . ibuprofen (ADVIL,MOTRIN) 200 MG tablet Take 400 mg by mouth every 6 (six) hours as needed for headache or moderate pain.    Marland Kitchen lidocaine-prilocaine (EMLA) cream Apply topically as needed. Use as directed on port site 1 hour prior to IV access 1 each 3  . lisinopril (PRINIVIL,ZESTRIL) 10 MG tablet Take 10 mg by mouth at bedtime.   6  . MELATONIN PO Take 5 mg by mouth at bedtime.     . metFORMIN (GLUCOPHAGE) 1000 MG tablet Take 1,000 mg by mouth 2 (two) times daily with a meal.    . metoprolol succinate (TOPROL-XL) 100 MG 24 hr tablet Take 100 mg by mouth every evening.     . Omega-3 Fatty Acids (FISH OIL PO) Take 1 capsule by mouth every evening.     . rosuvastatin (CRESTOR) 40 MG tablet Take by mouth daily. Pt takes 1/2 tablet(71m) on Mondays, Wednesdays, Fridays.    .Marland Kitchenaspirin EC 81 MG tablet Take 81 mg by mouth at bedtime.     . cetirizine (ZYRTEC) 10 MG tablet Take 10 mg by  mouth daily as needed for allergies.    .Marland KitchenHYDROcodone-homatropine (HYCODAN) 5-1.5 MG/5ML syrup Take 5 mLs by mouth every 6 (six) hours as needed for cough. (Patient not taking: Reported on 10/26/2014) 120 mL 0  . prochlorperazine (COMPAZINE) 10 MG tablet Take 1 tablet (10 mg total) by mouth every 6 (six) hours as needed (Nausea or vomiting). (Patient not taking: Reported on 10/26/2014) 30 tablet 1   No current facility-administered medications for this visit.    OBJECTIVE: Middle-aged white woman who appears stated age F52Vitals:   12/07/14 0853  BP: 143/74  Pulse: 76  Temp: 98.9 F (37.2 C)  Resp: 18     Body mass index is 27.78 kg/(m^2).    ECOG FS:1 - Symptomatic but completely ambulatory  Skin: warm, dry  HEENT: sclerae anicteric, conjunctivae pink, oropharynx clear. No thrush or mucositis.  Lymph Nodes: No cervical or supraclavicular lymphadenopathy  Lungs: clear to auscultation bilaterally, no rales, wheezes, or rhonci  Heart: regular rate and rhythm  Abdomen:  round, soft, non tender, positive bowel sounds  Musculoskeletal: No focal spinal tenderness, no peripheral edema  Neuro: non focal, well oriented, positive affect  Breasts: deferred  LAB RESULTS:  CMP     Component Value Date/Time   NA 139 11/30/2014 0841   K 4.7 11/30/2014 0841   CO2 23 11/30/2014 0841   GLUCOSE 129 11/30/2014 0841   BUN 13.3 11/30/2014 0841   CREATININE 0.7 11/30/2014 0841   CALCIUM 9.5 11/30/2014 0841   PROT 6.3* 11/30/2014 0841   ALBUMIN 3.8 11/30/2014 0841   AST 17 11/30/2014 0841   ALT 16 11/30/2014 0841   ALKPHOS 57 11/30/2014 0841   BILITOT 0.60 11/30/2014 0841    INo results found for: SPEP, UPEP  Lab Results  Component Value Date   WBC 3.7* 12/07/2014   NEUTROABS 2.7 12/07/2014   HGB 11.5* 12/07/2014   HCT 33.4* 12/07/2014   MCV 93.8 12/07/2014   PLT 216 12/07/2014      Chemistry      Component Value Date/Time   NA 139 11/30/2014 0841   K 4.7 11/30/2014 0841   CO2  23 11/30/2014 0841   BUN 13.3 11/30/2014 0841   CREATININE 0.7 11/30/2014 0841      Component Value Date/Time   CALCIUM 9.5 11/30/2014 0841   ALKPHOS 57 11/30/2014 0841   AST 17 11/30/2014 0841   ALT 16 11/30/2014 0841   BILITOT 0.60 11/30/2014 0841       No results found for: LABCA2  No components found for: [id number]  No results for input(s): INR in the last 168 hours.  Urinalysis No results found for: COLORURINE, APPEARANCEUR, LABSPEC, PHURINE, GLUCOSEU, HGBUR, BILIRUBINUR, KETONESUR, PROTEINUR, UROBILINOGEN, NITRITE, LEUKOCYTESUR  STUDIES: No results found.  ASSESSMENT: 64 y.o. [place] woman status post right breast upper outer quadrant biopsy 07/13/2014, for a clinical T3 N0, stage IIB invasive ductal carcinoma, grade 3, estrogen receptor positive, HER-2 and progesterone receptor negative, with an MIB-1 of 40%  (1) neoadjuvant chemotherapy starting 08/15/2013, consisting of doxorubicin and cyclophosphamide in dose dense fashion 4, completed 09/28/2014, followed by weekly paclitaxel 12 starting 10/19/2014  (2) breast conserving surgery with sentinel lymph node sampling to follow chemotherapy  (3) adjuvant radiation to follow surgery  (4) anti-estrogens to follow radiation  PLAN: Shilah continues manage treatment with remarkable endurance. The labs were reviewed in detail and were entirely stable. She will proceed with cycle 8 of paclitaxel as planned today.  Marlisa will return in 1 week for cycle 9 of treatment. She understands and agrees with this plan. She knows the goal of treatment in her case is cure. She has been encouraged to call with any issues that might arise before her next visit here.   Laurie Panda, NP   12/07/2014 9:12 AM

## 2014-12-07 NOTE — Patient Instructions (Signed)
[facility] Discharge Instructions for Patients Receiving Chemotherapy  Today you received the following chemotherapy agents Paclitaxel.   To help prevent nausea and vomiting after your treatment, we encourage you to take your nausea medication as directed.    If you develop nausea and vomiting that is not controlled by your nausea medication, call the clinic.   BELOW ARE SYMPTOMS THAT SHOULD BE REPORTED IMMEDIATELY:  *FEVER GREATER THAN 100.5 F  *CHILLS WITH OR WITHOUT FEVER  NAUSEA AND VOMITING THAT IS NOT CONTROLLED WITH YOUR NAUSEA MEDICATION  *UNUSUAL SHORTNESS OF BREATH  *UNUSUAL BRUISING OR BLEEDING  TENDERNESS IN MOUTH AND THROAT WITH OR WITHOUT PRESENCE OF ULCERS  *URINARY PROBLEMS  *BOWEL PROBLEMS  UNUSUAL RASH Items with * indicate a potential emergency and should be followed up as soon as possible.  Feel free to call the clinic you have any questions or concerns. The clinic phone number is [phone].  Please show the CHEMO ALERT CARD at check-in to the Emergency Department and triage nurse.   

## 2014-12-07 NOTE — Telephone Encounter (Signed)
Gave avs & calendar for September/October. °

## 2014-12-14 ENCOUNTER — Encounter: Payer: Self-pay | Admitting: *Deleted

## 2014-12-14 ENCOUNTER — Ambulatory Visit (HOSPITAL_BASED_OUTPATIENT_CLINIC_OR_DEPARTMENT_OTHER): Payer: 59 | Admitting: Nurse Practitioner

## 2014-12-14 ENCOUNTER — Other Ambulatory Visit (HOSPITAL_BASED_OUTPATIENT_CLINIC_OR_DEPARTMENT_OTHER): Payer: 59

## 2014-12-14 ENCOUNTER — Encounter: Payer: Self-pay | Admitting: Nurse Practitioner

## 2014-12-14 ENCOUNTER — Ambulatory Visit (HOSPITAL_BASED_OUTPATIENT_CLINIC_OR_DEPARTMENT_OTHER): Payer: 59

## 2014-12-14 VITALS — BP 151/83 | HR 70 | Temp 98.5°F | Resp 18 | Ht 63.0 in | Wt 159.3 lb

## 2014-12-14 DIAGNOSIS — Z170 Estrogen receptor positive status [ER+]: Secondary | ICD-10-CM | POA: Diagnosis not present

## 2014-12-14 DIAGNOSIS — C50411 Malignant neoplasm of upper-outer quadrant of right female breast: Secondary | ICD-10-CM | POA: Diagnosis not present

## 2014-12-14 DIAGNOSIS — Z5111 Encounter for antineoplastic chemotherapy: Secondary | ICD-10-CM | POA: Diagnosis not present

## 2014-12-14 LAB — COMPREHENSIVE METABOLIC PANEL (CC13)
ALBUMIN: 3.7 g/dL (ref 3.5–5.0)
ALK PHOS: 54 U/L (ref 40–150)
ALT: 15 U/L (ref 0–55)
AST: 17 U/L (ref 5–34)
Anion Gap: 7 mEq/L (ref 3–11)
BILIRUBIN TOTAL: 0.54 mg/dL (ref 0.20–1.20)
BUN: 11.3 mg/dL (ref 7.0–26.0)
CO2: 26 mEq/L (ref 22–29)
CREATININE: 0.6 mg/dL (ref 0.6–1.1)
Calcium: 9.2 mg/dL (ref 8.4–10.4)
Chloride: 109 mEq/L (ref 98–109)
EGFR: >90 mL/min/{1.73_m2} (ref >90)
GLUCOSE: 117 mg/dL (ref 70–140)
POTASSIUM: 4.3 meq/L (ref 3.5–5.1)
SODIUM: 142 meq/L (ref 136–145)
TOTAL PROTEIN: 6.1 g/dL — AB (ref 6.4–8.3)

## 2014-12-14 LAB — CBC WITH DIFFERENTIAL/PLATELET
BASO%: 0.3 % (ref 0.0–2.0)
Basophils Absolute: 0 10*3/uL (ref 0.0–0.1)
EOS%: 1.1 % (ref 0.0–7.0)
Eosinophils Absolute: 0 10*3/uL (ref 0.0–0.5)
HCT: 33.3 % — ABNORMAL LOW (ref 34.8–46.6)
HEMOGLOBIN: 11.4 g/dL — AB (ref 11.6–15.9)
LYMPH#: 0.5 10*3/uL — AB (ref 0.9–3.3)
LYMPH%: 14.1 % (ref 14.0–49.7)
MCH: 31.5 pg (ref 25.1–34.0)
MCHC: 34.2 g/dL (ref 31.5–36.0)
MCV: 92 fL (ref 79.5–101.0)
MONO#: 0.4 10*3/uL (ref 0.1–0.9)
MONO%: 9.8 % (ref 0.0–14.0)
NEUT%: 74.7 % (ref 38.4–76.8)
NEUTROS ABS: 2.8 10*3/uL (ref 1.5–6.5)
NRBC: 0 % (ref 0–0)
Platelets: 193 10*3/uL (ref 145–400)
RBC: 3.62 10*6/uL — ABNORMAL LOW (ref 3.70–5.45)
RDW: 14.6 % — AB (ref 11.2–14.5)
WBC: 3.7 10*3/uL — ABNORMAL LOW (ref 3.9–10.3)

## 2014-12-14 MED ORDER — FAMOTIDINE IN NACL 20-0.9 MG/50ML-% IV SOLN
20.0000 mg | Freq: Once | INTRAVENOUS | Status: AC
  Administered 2014-12-14: 20 mg via INTRAVENOUS

## 2014-12-14 MED ORDER — FAMOTIDINE IN NACL 20-0.9 MG/50ML-% IV SOLN
INTRAVENOUS | Status: AC
  Filled 2014-12-14: qty 50

## 2014-12-14 MED ORDER — HEPARIN SOD (PORK) LOCK FLUSH 100 UNIT/ML IV SOLN
500.0000 [IU] | Freq: Once | INTRAVENOUS | Status: AC | PRN
  Administered 2014-12-14: 500 [IU]
  Filled 2014-12-14: qty 5

## 2014-12-14 MED ORDER — SODIUM CHLORIDE 0.9 % IV SOLN
Freq: Once | INTRAVENOUS | Status: AC
  Administered 2014-12-14: 11:00:00 via INTRAVENOUS
  Filled 2014-12-14: qty 4

## 2014-12-14 MED ORDER — DIPHENHYDRAMINE HCL 50 MG/ML IJ SOLN
25.0000 mg | Freq: Once | INTRAMUSCULAR | Status: AC
  Administered 2014-12-14: 25 mg via INTRAVENOUS

## 2014-12-14 MED ORDER — SODIUM CHLORIDE 0.9 % IJ SOLN
10.0000 mL | INTRAMUSCULAR | Status: DC | PRN
  Administered 2014-12-14: 10 mL
  Filled 2014-12-14: qty 10

## 2014-12-14 MED ORDER — DIPHENHYDRAMINE HCL 50 MG/ML IJ SOLN
INTRAMUSCULAR | Status: AC
  Filled 2014-12-14: qty 1

## 2014-12-14 MED ORDER — SODIUM CHLORIDE 0.9 % IV SOLN
Freq: Once | INTRAVENOUS | Status: AC
  Administered 2014-12-14: 11:00:00 via INTRAVENOUS

## 2014-12-14 MED ORDER — DEXTROSE 5 % IV SOLN
80.0000 mg/m2 | Freq: Once | INTRAVENOUS | Status: AC
  Administered 2014-12-14: 144 mg via INTRAVENOUS
  Filled 2014-12-14: qty 24

## 2014-12-14 NOTE — Patient Instructions (Signed)
[facility] Discharge Instructions for Patients Receiving Chemotherapy  Today you received the following chemotherapy agents:  Taxol  To help prevent nausea and vomiting after your treatment, we encourage you to take your nausea medication as prescribed.   If you develop nausea and vomiting that is not controlled by your nausea medication, call the clinic.   BELOW ARE SYMPTOMS THAT SHOULD BE REPORTED IMMEDIATELY:  *FEVER GREATER THAN 100.5 F  *CHILLS WITH OR WITHOUT FEVER  NAUSEA AND VOMITING THAT IS NOT CONTROLLED WITH YOUR NAUSEA MEDICATION  *UNUSUAL SHORTNESS OF BREATH  *UNUSUAL BRUISING OR BLEEDING  TENDERNESS IN MOUTH AND THROAT WITH OR WITHOUT PRESENCE OF ULCERS  *URINARY PROBLEMS  *BOWEL PROBLEMS  UNUSUAL RASH Items with * indicate a potential emergency and should be followed up as soon as possible.  Feel free to call the clinic you have any questions or concerns. The clinic phone number is [phone].  Please show the CHEMO ALERT CARD at check-in to the Emergency Department and triage nurse.   

## 2014-12-14 NOTE — Progress Notes (Signed)
[place]  [phone] [phone]     ID: Zenaya Ulatowski DOB: 01/13/1951  MR#: [id number]  YFV#:[id number]  Patient Care Team: [name], MD as PCP - General (Family Medicine) [name], MD as Consulting Physician (General Surgery) [name], MD as Consulting Physician (Oncology) [name], MD as Consulting Physician (Radiation Oncology) [name], RN as Registered Nurse [name], RN as Registered Nurse [name], MD as Referring Physician (Cardiology) [name], MD as Referring Physician (Dermatology) [name], NP as Nurse Practitioner (Nurse Practitioner) PCP: [name], MD OTHER MD:  CHIEF COMPLAINT: Estrogen receptor positive breast cancer  CURRENT TREATMENT: Neoadjuvant chemotherapy  BREAST CANCER HISTORY: From the original intake note:  Stephaine first noted a change in her right breast September 2015. At that time however she was consumed by the care of her mother-in-law, who had severe Alzheimer's disease. She finally passed in January 2016, but Monzerrath did not share the information regarding the right breast with her husband until late March 2016. At that point she was set up for bilateral diagnostic mammography with tomography at the breast Center, for 11/20/2014. This found the breast density to be category B. In the upper outer quadrant of the right breast there was a 4 cm irregular spiculated mass. There were no other findings of concern. The mass was palpable and firm, and by ultrasound measured 3.3 cm. The right axilla was negative sonographically.  Biopsy of the right breast mass in question was obtained for 03/22/2015. 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  On 07/20/2014 the patient underwent bilateral breast  MRI. This measured the large irregular enhancing mass in the right breast at the 10:00 position at 5.0 cm. There were no additional worrisome findings in the right breast, and no findings in the left breast or any abnormal appearing lymph nodes.  The patient's subsequent history is as detailed below  INTERVAL HISTORY: Kenzly returns today for follow up of her breast cancer. Today she is due for cycle 9 of 12 planned weekly cycles of paclitaxel.   REVIEW OF SYSTEMS: Again, Dezzie has no new complaints to offer. She continues to have loose stools on day 3. She does not sleep well that evening, or the evening of treatment because the benadryl given as a premed keeps her up via restless legs. Otherwise, she performs remarkably well with treatment. She has no fevers, chills, nausea, or vomiting. Her appetite is good. Her energy levels are decent. She denies mouth sores, rashes, or neuropathy symptoms. A detailed review of systems is entirely otherwise negative.   PAST MEDICAL HISTORY: Past Medical History  Diagnosis Date  . Breast cancer of upper-outer quadrant of right female breast 07/16/2014  . Diabetes mellitus without complication   . Hypertension   . Skin cancer   . Heart murmur     PAST SURGICAL HISTORY: Past Surgical History  Procedure Laterality Date  . Portacath placement N/A 08/03/2014    Procedure: INSERTION PORT-A-CATH;  Surgeon: [name], MD;  Location: [facility];  Service: General;  Laterality: N/A;    FAMILY HISTORY Family History  Problem Relation Age of Onset  . Lung cancer Father    the patient's father died from lung cancer in the setting of tobacco abuse at the age of 58. The patient's mother died from COPD at the age  of 58. The patient has 3 brothers, no sisters. There is no history of breast or ovarian cancer in the family.  GYNECOLOGIC HISTORY:  No LMP recorded. Patient is postmenopausal. Menarche age 61, first live birth age 89. She is GX P3. She went  through the change of life in her late 33s. She did not take hormone replacement.  SOCIAL HISTORY:  Marlin worked for [employer], but is now retired. Her husband [name] is Programmer, systems for a [employer]. They have been married more than 40 years. Son [name] "[name] was "'s William Laske is a climbing Acupuncturist in [place]. Son [name] "map" Shawntia Mangal is a Dealer in [place]. Daughter Shatara Stanek is a branch bank or for [employer] in [place]. The patient has 3 grandchildren.    ADVANCED DIRECTIVES: In place   HEALTH MAINTENANCE: Social History  Substance Use Topics  . Smoking status: Former Smoker -- 0.50 packs/day for 12 years    Types: Cigarettes    Quit date: 04/02/1986  . Smokeless tobacco: Never Used  . Alcohol Use: 1.8 oz/week    3 Glasses of wine per week     Colonoscopy: Never  PAP:  Bone density: Never  Lipid panel:  Allergies  Allergen Reactions  . Latex Itching, Dermatitis and Rash  . Lipitor [Atorvastatin] Other (See Comments)    Bad leg cramps    Current Outpatient Prescriptions  Medication Sig Dispense Refill  . ibuprofen (ADVIL,MOTRIN) 200 MG tablet Take 400 mg by mouth every 6 (six) hours as needed for headache or moderate pain.    Marland Kitchen lidocaine-prilocaine (EMLA) cream Apply topically as needed. Use as directed on port site 1 hour prior to IV access 1 each 3  . lisinopril (PRINIVIL,ZESTRIL) 10 MG tablet Take 10 mg by mouth at bedtime.   6  . MELATONIN PO Take 5 mg by mouth at bedtime.     . metFORMIN (GLUCOPHAGE) 1000 MG tablet Take 1,000 mg by mouth 2 (two) times daily with a meal.    . metoprolol succinate (TOPROL-XL) 100 MG 24 hr tablet Take 100 mg by mouth every evening.     . Omega-3 Fatty Acids (FISH OIL PO) Take 1 capsule by mouth every evening.     . rosuvastatin (CRESTOR) 40 MG tablet Take by mouth daily. Pt takes 1/2 tablet(48m) on Mondays, Wednesdays, Fridays.    .Marland Kitchenaspirin EC 81 MG tablet Take 81 mg by mouth  at bedtime.     . cetirizine (ZYRTEC) 10 MG tablet Take 10 mg by mouth daily as needed for allergies.    .Marland KitchenHYDROcodone-homatropine (HYCODAN) 5-1.5 MG/5ML syrup Take 5 mLs by mouth every 6 (six) hours as needed for cough. (Patient not taking: Reported on 10/26/2014) 120 mL 0  . prochlorperazine (COMPAZINE) 10 MG tablet Take 1 tablet (10 mg total) by mouth every 6 (six) hours as needed (Nausea or vomiting). (Patient not taking: Reported on 10/26/2014) 30 tablet 1   No current facility-administered medications for this visit.    OBJECTIVE: Middle-aged white woman who appears stated age F60Vitals:   12/14/14 0949  BP: 151/83  Pulse: 70  Temp: 98.5 F (36.9 C)  Resp: 18     Body mass index is 28.23 kg/(m^2).    ECOG FS:1 - Symptomatic but completely ambulatory  Sclerae unicteric, pupils round and equal Oropharynx clear and moist-- no thrush or other lesions No cervical or supraclavicular adenopathy Lungs no rales or rhonchi Heart regular rate and rhythm,  audible systolic murmur Abd soft, nontender, positive bowel sounds MSK no focal spinal tenderness, no upper extremity lymphedema Neuro: nonfocal, well oriented, appropriate affect Breasts: deferred  LAB RESULTS:  CMP     Component Value Date/Time   NA 142 12/14/2014 0931   K 4.3 12/14/2014 0931   CO2 26 12/14/2014 0931   GLUCOSE 117 12/14/2014 0931   BUN 11.3 12/14/2014 0931   CREATININE 0.6 12/14/2014 0931   CALCIUM 9.2 12/14/2014 0931   PROT 6.1* 12/14/2014 0931   ALBUMIN 3.7 12/14/2014 0931   AST 17 12/14/2014 0931   ALT 15 12/14/2014 0931   ALKPHOS 54 12/14/2014 0931   BILITOT 0.54 12/14/2014 0931    INo results found for: SPEP, UPEP  Lab Results  Component Value Date   WBC 3.7* 12/14/2014   NEUTROABS 2.8 12/14/2014   HGB 11.4* 12/14/2014   HCT 33.3* 12/14/2014   MCV 92.0 12/14/2014   PLT 193 12/14/2014      Chemistry      Component Value Date/Time   NA 142 12/14/2014 0931   K 4.3 12/14/2014 0931   CO2  26 12/14/2014 0931   BUN 11.3 12/14/2014 0931   CREATININE 0.6 12/14/2014 0931      Component Value Date/Time   CALCIUM 9.2 12/14/2014 0931   ALKPHOS 54 12/14/2014 0931   AST 17 12/14/2014 0931   ALT 15 12/14/2014 0931   BILITOT 0.54 12/14/2014 0931       No results found for: LABCA2  No components found for: [id number]  No results for input(s): INR in the last 168 hours.  Urinalysis No results found for: COLORURINE, APPEARANCEUR, LABSPEC, PHURINE, GLUCOSEU, HGBUR, BILIRUBINUR, KETONESUR, PROTEINUR, UROBILINOGEN, NITRITE, LEUKOCYTESUR  STUDIES: No results found.  ASSESSMENT: 64 y.o. [place] woman status post right breast upper outer quadrant biopsy 07/13/2014, for a clinical T3 N0, stage IIB invasive ductal carcinoma, grade 3, estrogen receptor positive, HER-2 and progesterone receptor negative, with an MIB-1 of 40%  (1) neoadjuvant chemotherapy starting 08/15/2013, consisting of doxorubicin and cyclophosphamide in dose dense fashion 4, completed 09/28/2014, followed by weekly paclitaxel 12 starting 10/19/2014  (2) breast conserving surgery with sentinel lymph node sampling to follow chemotherapy  (3) adjuvant radiation to follow surgery  (4) anti-estrogens to follow radiation  PLAN: Sharonica continues manage treatment with remarkable endurance. The labs were reviewed in detail and were entirely stable. She will proceed with cycle 9 of paclitaxel as planned today.  Tyreanna will return in 1 week for cycle 10 of treatment. She understands and agrees with this plan. She knows the goal of treatment in her case is cure. She has been encouraged to call with any issues that might arise before her next visit here.   Laurie Panda, NP   12/14/2014 10:33 AM

## 2014-12-21 ENCOUNTER — Other Ambulatory Visit (HOSPITAL_BASED_OUTPATIENT_CLINIC_OR_DEPARTMENT_OTHER): Payer: 59

## 2014-12-21 ENCOUNTER — Ambulatory Visit (HOSPITAL_BASED_OUTPATIENT_CLINIC_OR_DEPARTMENT_OTHER): Payer: 59 | Admitting: Nurse Practitioner

## 2014-12-21 ENCOUNTER — Encounter: Payer: Self-pay | Admitting: *Deleted

## 2014-12-21 ENCOUNTER — Other Ambulatory Visit: Payer: Self-pay | Admitting: Oncology

## 2014-12-21 ENCOUNTER — Ambulatory Visit (HOSPITAL_BASED_OUTPATIENT_CLINIC_OR_DEPARTMENT_OTHER): Payer: 59

## 2014-12-21 ENCOUNTER — Encounter: Payer: Self-pay | Admitting: Nurse Practitioner

## 2014-12-21 VITALS — BP 130/66 | HR 70 | Temp 98.5°F | Resp 18 | Ht 63.0 in | Wt 155.8 lb

## 2014-12-21 DIAGNOSIS — C50411 Malignant neoplasm of upper-outer quadrant of right female breast: Secondary | ICD-10-CM

## 2014-12-21 DIAGNOSIS — Z5111 Encounter for antineoplastic chemotherapy: Secondary | ICD-10-CM | POA: Diagnosis not present

## 2014-12-21 LAB — CBC WITH DIFFERENTIAL/PLATELET
BASO%: 0.2 % (ref 0.0–2.0)
BASOS ABS: 0 10*3/uL (ref 0.0–0.1)
EOS%: 0.6 % (ref 0.0–7.0)
Eosinophils Absolute: 0 10*3/uL (ref 0.0–0.5)
HEMATOCRIT: 36.3 % (ref 34.8–46.6)
HEMOGLOBIN: 12.4 g/dL (ref 11.6–15.9)
LYMPH#: 0.7 10*3/uL — AB (ref 0.9–3.3)
LYMPH%: 14.3 % (ref 14.0–49.7)
MCH: 31.4 pg (ref 25.1–34.0)
MCHC: 34.2 g/dL (ref 31.5–36.0)
MCV: 91.9 fL (ref 79.5–101.0)
MONO#: 0.4 10*3/uL (ref 0.1–0.9)
MONO%: 9.3 % (ref 0.0–14.0)
NEUT#: 3.5 10*3/uL (ref 1.5–6.5)
NEUT%: 75.6 % (ref 38.4–76.8)
Platelets: 228 10*3/uL (ref 145–400)
RBC: 3.95 10*6/uL (ref 3.70–5.45)
RDW: 14.6 % — AB (ref 11.2–14.5)
WBC: 4.6 10*3/uL (ref 3.9–10.3)

## 2014-12-21 LAB — COMPREHENSIVE METABOLIC PANEL (CC13)
ALBUMIN: 3.9 g/dL (ref 3.5–5.0)
ALK PHOS: 54 U/L (ref 40–150)
ALT: 14 U/L (ref 0–55)
AST: 16 U/L (ref 5–34)
Anion Gap: 8 mEq/L (ref 3–11)
BILIRUBIN TOTAL: 0.53 mg/dL (ref 0.20–1.20)
BUN: 13.3 mg/dL (ref 7.0–26.0)
CALCIUM: 9.7 mg/dL (ref 8.4–10.4)
CO2: 24 mEq/L (ref 22–29)
CREATININE: 0.7 mg/dL (ref 0.6–1.1)
Chloride: 107 mEq/L (ref 98–109)
EGFR: >90 mL/min/{1.73_m2} (ref >90)
Glucose: 121 mg/dl (ref 70–140)
POTASSIUM: 4.8 meq/L (ref 3.5–5.1)
Sodium: 139 mEq/L (ref 136–145)
Total Protein: 6.4 g/dL (ref 6.4–8.3)

## 2014-12-21 MED ORDER — SODIUM CHLORIDE 0.9 % IJ SOLN
10.0000 mL | INTRAMUSCULAR | Status: DC | PRN
  Administered 2014-12-21: 10 mL
  Filled 2014-12-21: qty 10

## 2014-12-21 MED ORDER — SODIUM CHLORIDE 0.9 % IV SOLN
Freq: Once | INTRAVENOUS | Status: AC
  Administered 2014-12-21: 10:00:00 via INTRAVENOUS

## 2014-12-21 MED ORDER — SODIUM CHLORIDE 0.9 % IV SOLN
Freq: Once | INTRAVENOUS | Status: AC
  Administered 2014-12-21: 10:00:00 via INTRAVENOUS
  Filled 2014-12-21: qty 4

## 2014-12-21 MED ORDER — HEPARIN SOD (PORK) LOCK FLUSH 100 UNIT/ML IV SOLN
500.0000 [IU] | Freq: Once | INTRAVENOUS | Status: AC | PRN
  Administered 2014-12-21: 500 [IU]
  Filled 2014-12-21: qty 5

## 2014-12-21 MED ORDER — DIPHENHYDRAMINE HCL 50 MG/ML IJ SOLN
INTRAMUSCULAR | Status: AC
  Filled 2014-12-21: qty 1

## 2014-12-21 MED ORDER — FAMOTIDINE IN NACL 20-0.9 MG/50ML-% IV SOLN
INTRAVENOUS | Status: AC
  Filled 2014-12-21: qty 50

## 2014-12-21 MED ORDER — DIPHENHYDRAMINE HCL 50 MG/ML IJ SOLN
25.0000 mg | Freq: Once | INTRAMUSCULAR | Status: AC
  Administered 2014-12-21: 25 mg via INTRAVENOUS

## 2014-12-21 MED ORDER — FAMOTIDINE IN NACL 20-0.9 MG/50ML-% IV SOLN
20.0000 mg | Freq: Once | INTRAVENOUS | Status: AC
  Administered 2014-12-21: 20 mg via INTRAVENOUS

## 2014-12-21 MED ORDER — PACLITAXEL CHEMO INJECTION 300 MG/50ML
80.0000 mg/m2 | Freq: Once | INTRAVENOUS | Status: AC
  Administered 2014-12-21: 144 mg via INTRAVENOUS
  Filled 2014-12-21: qty 24

## 2014-12-21 NOTE — Progress Notes (Signed)
[facility]  [phone] Fax:(336) [phone]     ID: [name] DOB: 06-14-1950  MR#: [id number]  ONG#:[id number]  Patient Care Team: [name], MD as PCP - General (Family Medicine) [name], MD as Consulting Physician (General Surgery) [name], MD as Consulting Physician (Oncology) [name], MD as Consulting Physician (Radiation Oncology) [name], RN as Registered Nurse [name], RN as Registered Nurse [name], MD as Referring Physician (Cardiology) [name], MD as Referring Physician (Dermatology) [name], NP as Nurse Practitioner (Nurse Practitioner) PCP: [name], MD OTHER MD:  CHIEF COMPLAINT: Estrogen receptor positive breast cancer  CURRENT TREATMENT: Neoadjuvant chemotherapy  BREAST CANCER HISTORY: From the original intake note:  Darlene Kelly first noted a change in her right breast September 2015. At that time however she was consumed by the care of her mother-in-law, who had severe Alzheimer's disease. She finally passed in January 2016, but Darlene Kelly did not share the information regarding the right breast with her husband until late March 2016. At that point she was set up for bilateral diagnostic mammography with tomography at the breast Center, for 11/20/2014. This found the breast density to be category B. In the upper outer quadrant of the right breast there was a 4 cm irregular spiculated mass. There were no other findings of concern. The mass was palpable and firm, and by ultrasound measured 3.3 cm. The right axilla was negative sonographically.  Biopsy of the right breast mass in question was obtained for 03/22/2015. 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  On 07/20/2014 the patient underwent bilateral breast  MRI. This measured the large irregular enhancing mass in the right breast at the 10:00 position at 5.0 cm. There were no additional worrisome findings in the right breast, and no findings in the left breast or any abnormal appearing lymph nodes.  The patient's subsequent history is as detailed below  INTERVAL HISTORY: Darlene Kelly returns today for follow up of her breast cancer, accompanied by her daughter. Today she is due for cycle 10 of 12 planned weekly cycles of paclitaxel.   REVIEW OF SYSTEMS: Dua denies fevers, chills, nausea, or vomiting. She has loose stools on day 3, but otherwise they return to normal. Her appetite is good. She is losing weight because she is making healthier food choices.  She denies mouth sores, rashes, or neuropathy symptoms. Her energy levels are decent. She sleeps well for the most part after benadryl is out of her system. A detailed review of systems is otherwise stable.  PAST MEDICAL HISTORY: Past Medical History  Diagnosis Date  . Breast cancer of upper-outer quadrant of right female breast 07/16/2014  . Diabetes mellitus without complication   . Hypertension   . Skin cancer   . Heart murmur     PAST SURGICAL HISTORY: Past Surgical History  Procedure Laterality Date  . Portacath placement N/A 08/03/2014    Procedure: INSERTION PORT-A-CATH;  Surgeon: [name], MD;  Location: [facility];  Service: General;  Laterality: N/A;    FAMILY HISTORY Family History  Problem Relation Age of Onset  . Lung cancer Father    the patient's father died from lung cancer in the setting of tobacco abuse at the age of 32. The patient's mother died from COPD at the age of 46. The patient has 3 brothers, no sisters.  There is no history of breast or ovarian cancer in the family.  GYNECOLOGIC HISTORY:  No LMP recorded. Patient is postmenopausal. Menarche age 73, first live birth age 76. She is GX P3. She went through the change of life in her late 69s. She did not take  hormone replacement.  SOCIAL HISTORY:  [name] worked for [employer], but is now retired. Her husband [name] is Programmer, systems for a [employer]. They have been married more than 40 years. Son Darlene Kelly "[name] was "'s Ayra Kelly is a climbing Acupuncturist in [place]. Son Darlene Kelly "map" [name] is a Dealer in [place]. [name] is a branch bank or for [employer] in [place]. The patient has 3 grandchildren.    ADVANCED DIRECTIVES: In place   HEALTH MAINTENANCE: Social History  Substance Use Topics  . Smoking status: Former Smoker -- 0.50 packs/day for 12 years    Types: Cigarettes    Quit date: 04/02/1986  . Smokeless tobacco: Never Used  . Alcohol Use: 1.8 oz/week    3 Glasses of wine per week     Colonoscopy: Never  PAP:  Bone density: Never  Lipid panel:  Allergies  Allergen Reactions  . Latex Itching, Dermatitis and Rash  . Lipitor [Atorvastatin] Other (See Comments)    Bad leg cramps    Current Outpatient Prescriptions  Medication Sig Dispense Refill  . ibuprofen (ADVIL,MOTRIN) 200 MG tablet Take 400 mg by mouth every 6 (six) hours as needed for headache or moderate pain.    Marland Kitchen lidocaine-prilocaine (EMLA) cream Apply topically as needed. Use as directed on port site 1 hour prior to IV access 1 each 3  . lisinopril (PRINIVIL,ZESTRIL) 10 MG tablet Take 10 mg by mouth at bedtime.   6  . MELATONIN PO Take 5 mg by mouth at bedtime.     . metFORMIN (GLUCOPHAGE) 1000 MG tablet Take 1,000 mg by mouth 2 (two) times daily with a meal.    . metoprolol succinate (TOPROL-XL) 100 MG 24 hr tablet Take 100 mg by mouth every evening.     . Omega-3 Fatty Acids (FISH OIL PO) Take 1 capsule by mouth every evening.     . rosuvastatin (CRESTOR) 40 MG tablet Take by mouth daily. Pt takes 1/2 tablet(57m) on Mondays, Wednesdays, Fridays.    .Marland Kitchenaspirin EC 81 MG tablet Take 81 mg by mouth at bedtime.     . cetirizine (ZYRTEC) 10 MG tablet Take 10 mg  by mouth daily as needed for allergies.    .Marland KitchenHYDROcodone-homatropine (HYCODAN) 5-1.5 MG/5ML syrup Take 5 mLs by mouth every 6 (six) hours as needed for cough. (Patient not taking: Reported on 10/26/2014) 120 mL 0  . prochlorperazine (COMPAZINE) 10 MG tablet Take 1 tablet (10 mg total) by mouth every 6 (six) hours as needed (Nausea or vomiting). (Patient not taking: Reported on 10/26/2014) 30 tablet 1   No current facility-administered medications for this visit.    OBJECTIVE: Middle-aged white woman who appears stated age F81Vitals:   12/21/14 0854  BP: 130/66  Pulse: 70  Temp: 98.5 F (36.9 C)  Resp: 18     Body mass index is 27.61 kg/(m^2).    ECOG FS:1 - Symptomatic but completely ambulatory  Skin: warm, dry  HEENT: sclerae anicteric, conjunctivae pink, oropharynx clear. No thrush or mucositis.  Lymph Nodes: No cervical or supraclavicular lymphadenopathy  Lungs: clear to auscultation bilaterally, no rales, wheezes, or rhonci  Heart: regular rate and  rhythm, systolic murmur Abdomen: round, soft, non tender, positive bowel sounds  Musculoskeletal: No focal spinal tenderness, no peripheral edema  Neuro: non focal, well oriented, positive affect  Breasts: deferred  LAB RESULTS:  CMP     Component Value Date/Time   NA 142 12/14/2014 0931   K 4.3 12/14/2014 0931   CO2 26 12/14/2014 0931   GLUCOSE 117 12/14/2014 0931   BUN 11.3 12/14/2014 0931   CREATININE 0.6 12/14/2014 0931   CALCIUM 9.2 12/14/2014 0931   PROT 6.1* 12/14/2014 0931   ALBUMIN 3.7 12/14/2014 0931   AST 17 12/14/2014 0931   ALT 15 12/14/2014 0931   ALKPHOS 54 12/14/2014 0931   BILITOT 0.54 12/14/2014 0931    INo results found for: SPEP, UPEP  Lab Results  Component Value Date   WBC 4.6 12/21/2014   NEUTROABS 3.5 12/21/2014   HGB 12.4 12/21/2014   HCT 36.3 12/21/2014   MCV 91.9 12/21/2014   PLT 228 12/21/2014      Chemistry      Component Value Date/Time   NA 142 12/14/2014 0931   K 4.3  12/14/2014 0931   CO2 26 12/14/2014 0931   BUN 11.3 12/14/2014 0931   CREATININE 0.6 12/14/2014 0931      Component Value Date/Time   CALCIUM 9.2 12/14/2014 0931   ALKPHOS 54 12/14/2014 0931   AST 17 12/14/2014 0931   ALT 15 12/14/2014 0931   BILITOT 0.54 12/14/2014 0931       No results found for: LABCA2  No components found for: [id number]  No results for input(s): INR in the last 168 hours.  Urinalysis No results found for: COLORURINE, APPEARANCEUR, LABSPEC, PHURINE, GLUCOSEU, HGBUR, BILIRUBINUR, KETONESUR, PROTEINUR, UROBILINOGEN, NITRITE, LEUKOCYTESUR  STUDIES: No results found.  ASSESSMENT: 64 y.o. [place] woman status post right breast upper outer quadrant biopsy 07/13/2014, for a clinical T3 N0, stage IIB invasive ductal carcinoma, grade 3, estrogen receptor positive, HER-2 and progesterone receptor negative, with an MIB-1 of 40%  (1) neoadjuvant chemotherapy starting 08/15/2013, consisting of doxorubicin and cyclophosphamide in dose dense fashion 4, completed 09/28/2014, followed by weekly paclitaxel 12 starting 10/19/2014  (2) breast conserving surgery with sentinel lymph node sampling to follow chemotherapy  (3) adjuvant radiation to follow surgery  (4) anti-estrogens to follow radiation  PLAN: Shaquoya looks and feels well today. The labs were reviewed in detail and were entirely stable. She is no longer anemic at this point. She will proceed with cycle 10 of paclitaxel as planned today.  I have placed a referral to Dr. Unknown Jim office for a radiation consultation visit today.  Lahna will return in 1 week for cycle 11 of treatment. She understands and agrees with this plan. She knows the goal of treatment in her case is cure. She has been encouraged to call with any issues that might arise before her next visit here.   Laurie Panda, NP   12/21/2014 9:10 AM

## 2014-12-21 NOTE — Patient Instructions (Signed)
[facility] Discharge Instructions for Patients Receiving Chemotherapy  Today you received the following chemotherapy agents:  Taxol  To help prevent nausea and vomiting after your treatment, we encourage you to take your nausea medication as prescribed.   If you develop nausea and vomiting that is not controlled by your nausea medication, call the clinic.   BELOW ARE SYMPTOMS THAT SHOULD BE REPORTED IMMEDIATELY:  *FEVER GREATER THAN 100.5 F  *CHILLS WITH OR WITHOUT FEVER  NAUSEA AND VOMITING THAT IS NOT CONTROLLED WITH YOUR NAUSEA MEDICATION  *UNUSUAL SHORTNESS OF BREATH  *UNUSUAL BRUISING OR BLEEDING  TENDERNESS IN MOUTH AND THROAT WITH OR WITHOUT PRESENCE OF ULCERS  *URINARY PROBLEMS  *BOWEL PROBLEMS  UNUSUAL RASH Items with * indicate a potential emergency and should be followed up as soon as possible.  Feel free to call the clinic you have any questions or concerns. The clinic phone number is [phone].  Please show the CHEMO ALERT CARD at check-in to the Emergency Department and triage nurse.   

## 2014-12-28 ENCOUNTER — Encounter: Payer: Self-pay | Admitting: *Deleted

## 2014-12-28 ENCOUNTER — Ambulatory Visit (HOSPITAL_BASED_OUTPATIENT_CLINIC_OR_DEPARTMENT_OTHER): Payer: 59 | Admitting: Nurse Practitioner

## 2014-12-28 ENCOUNTER — Encounter: Payer: Self-pay | Admitting: Nurse Practitioner

## 2014-12-28 ENCOUNTER — Ambulatory Visit (HOSPITAL_BASED_OUTPATIENT_CLINIC_OR_DEPARTMENT_OTHER): Payer: 59

## 2014-12-28 ENCOUNTER — Other Ambulatory Visit (HOSPITAL_BASED_OUTPATIENT_CLINIC_OR_DEPARTMENT_OTHER): Payer: 59

## 2014-12-28 VITALS — BP 132/86 | HR 79 | Temp 98.7°F | Resp 20 | Ht 63.0 in | Wt 155.7 lb

## 2014-12-28 DIAGNOSIS — C50411 Malignant neoplasm of upper-outer quadrant of right female breast: Secondary | ICD-10-CM

## 2014-12-28 DIAGNOSIS — Z5111 Encounter for antineoplastic chemotherapy: Secondary | ICD-10-CM

## 2014-12-28 DIAGNOSIS — Z170 Estrogen receptor positive status [ER+]: Secondary | ICD-10-CM | POA: Diagnosis not present

## 2014-12-28 LAB — CBC WITH DIFFERENTIAL/PLATELET
BASO%: 0.6 % (ref 0.0–2.0)
BASOS ABS: 0 10*3/uL (ref 0.0–0.1)
EOS%: 1.1 % (ref 0.0–7.0)
Eosinophils Absolute: 0 10*3/uL (ref 0.0–0.5)
HCT: 35.8 % (ref 34.8–46.6)
HEMOGLOBIN: 12.2 g/dL (ref 11.6–15.9)
LYMPH%: 17 % (ref 14.0–49.7)
MCH: 31.3 pg (ref 25.1–34.0)
MCHC: 34.2 g/dL (ref 31.5–36.0)
MCV: 91.6 fL (ref 79.5–101.0)
MONO#: 0.4 10*3/uL (ref 0.1–0.9)
MONO%: 9.9 % (ref 0.0–14.0)
NEUT%: 71.4 % (ref 38.4–76.8)
NEUTROS ABS: 2.5 10*3/uL (ref 1.5–6.5)
Platelets: 224 10*3/uL (ref 145–400)
RBC: 3.91 10*6/uL (ref 3.70–5.45)
RDW: 15.4 % — AB (ref 11.2–14.5)
WBC: 3.6 10*3/uL — AB (ref 3.9–10.3)
lymph#: 0.6 10*3/uL — ABNORMAL LOW (ref 0.9–3.3)

## 2014-12-28 MED ORDER — FAMOTIDINE IN NACL 20-0.9 MG/50ML-% IV SOLN
20.0000 mg | Freq: Once | INTRAVENOUS | Status: AC
  Administered 2014-12-28: 20 mg via INTRAVENOUS

## 2014-12-28 MED ORDER — SODIUM CHLORIDE 0.9 % IV SOLN
Freq: Once | INTRAVENOUS | Status: AC
  Administered 2014-12-28: 11:00:00 via INTRAVENOUS
  Filled 2014-12-28: qty 4

## 2014-12-28 MED ORDER — DIPHENHYDRAMINE HCL 50 MG/ML IJ SOLN
INTRAMUSCULAR | Status: AC
  Filled 2014-12-28: qty 1

## 2014-12-28 MED ORDER — DIPHENHYDRAMINE HCL 50 MG/ML IJ SOLN
25.0000 mg | Freq: Once | INTRAMUSCULAR | Status: AC
  Administered 2014-12-28: 25 mg via INTRAVENOUS

## 2014-12-28 MED ORDER — SODIUM CHLORIDE 0.9 % IV SOLN
Freq: Once | INTRAVENOUS | Status: AC
  Administered 2014-12-28: 10:00:00 via INTRAVENOUS

## 2014-12-28 MED ORDER — SODIUM CHLORIDE 0.9 % IJ SOLN
10.0000 mL | INTRAMUSCULAR | Status: DC | PRN
  Administered 2014-12-28: 10 mL
  Filled 2014-12-28: qty 10

## 2014-12-28 MED ORDER — PACLITAXEL CHEMO INJECTION 300 MG/50ML
80.0000 mg/m2 | Freq: Once | INTRAVENOUS | Status: AC
  Administered 2014-12-28: 144 mg via INTRAVENOUS
  Filled 2014-12-28: qty 24

## 2014-12-28 MED ORDER — FAMOTIDINE IN NACL 20-0.9 MG/50ML-% IV SOLN
INTRAVENOUS | Status: AC
  Filled 2014-12-28: qty 50

## 2014-12-28 MED ORDER — HEPARIN SOD (PORK) LOCK FLUSH 100 UNIT/ML IV SOLN
500.0000 [IU] | Freq: Once | INTRAVENOUS | Status: AC | PRN
  Administered 2014-12-28: 500 [IU]
  Filled 2014-12-28: qty 5

## 2014-12-28 NOTE — Progress Notes (Signed)
[name]  Telephone:(336) [phone] Fax:(336) [phone]     ID: Darlene Kelly DOB: April 19, 1950  MR#: [id number]  IHK#:[id number]  Patient Care Team: [name], MD as PCP - General (Family Medicine) [name], MD as Consulting Physician (General Surgery) [name], MD as Consulting Physician (Oncology) [name], MD as Consulting Physician (Radiation Oncology) [name], RN as Registered Nurse [name], RN as Registered Nurse [name], MD as Referring Physician (Cardiology) [name], MD as Referring Physician (Dermatology) [name], NP as Nurse Practitioner (Nurse Practitioner) PCP: [name], MD OTHER MD:  CHIEF COMPLAINT: Estrogen receptor positive breast cancer  CURRENT TREATMENT: Neoadjuvant chemotherapy  BREAST CANCER HISTORY: From the original intake note:  [name] first noted a change in her right breast September 2015. At that time however she was consumed by the care of her mother-in-law, who had severe Alzheimer's disease. She finally passed in January 2016, but [name] did not share the information regarding the right breast with her husband until late March 2016. At that point she was set up for bilateral diagnostic mammography with tomography at the breast Center, for 11/20/2014. This found the breast density to be category B. In the upper outer quadrant of the right breast there was a 4 cm irregular spiculated mass. There were no other findings of concern. The mass was palpable and firm, and by ultrasound measured 3.3 cm. The right axilla was negative sonographically.  Biopsy of the right breast mass in question was obtained for 03/22/2015. 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  On 07/20/2014 the patient underwent bilateral breast  MRI. This measured the large irregular enhancing mass in the right breast at the 10:00 position at 5.0 cm. There were no additional worrisome findings in the right breast, and no findings in the left breast or any abnormal appearing lymph nodes.  The patient's subsequent history is as detailed below  INTERVAL HISTORY: Darlene Kelly returns today for follow up of her breast cancer, alone. Today she is due for cycle 11 of 12 planned weekly cycles of paclitaxel.   REVIEW OF SYSTEMS: Kynsie states she feels the best she has in months today. Usually day 3 or 4 are low points during treatment, but she never experienced them this past week. She denies fevers, chills, nausea, or vomiting, or changes in bowels or bladder habits.. Her appetite is good. She is losing weight because she is making healthier food choices.  She denies mouth sores, rashes, or neuropathy symptoms. Her energy levels are remarkable. She has no headaches, dizziness, or vision changes. A detailed review of systems is otherwise stable.  PAST MEDICAL HISTORY: Past Medical History  Diagnosis Date  . Breast cancer of upper-outer quadrant of right female breast 07/16/2014  . Diabetes mellitus without complication   . Hypertension   . Skin cancer   . Heart murmur     PAST SURGICAL HISTORY: Past Surgical History  Procedure Laterality Date  . Portacath placement N/A 08/03/2014    Procedure: INSERTION PORT-A-CATH;  Surgeon: [name], MD;  Location: [facility];  Service: General;  Laterality: N/A;    FAMILY HISTORY Family History  Problem Relation Age of Onset  . Lung cancer Father    the patient's father died from lung cancer in the setting of tobacco abuse at the age of 63. The patient's mother died  from COPD at the age of 51. The patient has 3 brothers, no sisters. There is no history of breast or ovarian cancer in the family.  GYNECOLOGIC HISTORY:  No LMP recorded. Patient is postmenopausal. Menarche age 19, first live birth age  36. She is GX P3. She went through the change of life in her late 20s. She did not take hormone replacement.  SOCIAL HISTORY:  [name] worked for [employer], but is now retired. Her husband [name] is Programmer, systems for a [employer]. They have been married more than 40 years. Son Darlene Kelly "[name] was "'s Darlene Kelly is a climbing Acupuncturist in [place]. Son Darlene Kelly "map" [name] is a Dealer in [place]. [name] is a branch bank or for [employer] in [place]. The patient has 3 grandchildren.    ADVANCED DIRECTIVES: In place   HEALTH MAINTENANCE: Social History  Substance Use Topics  . Smoking status: Former Smoker -- 0.50 packs/day for 12 years    Types: Cigarettes    Quit date: 04/02/1986  . Smokeless tobacco: Never Used  . Alcohol Use: 1.8 oz/week    3 Glasses of wine per week     Colonoscopy: Never  PAP:  Bone density: Never  Lipid panel:  Allergies  Allergen Reactions  . Latex Itching, Dermatitis and Rash  . Lipitor [Atorvastatin] Other (See Comments)    Bad leg cramps    Current Outpatient Prescriptions  Medication Sig Dispense Refill  . aspirin EC 81 MG tablet Take 81 mg by mouth at bedtime.     . cetirizine (ZYRTEC) 10 MG tablet Take 10 mg by mouth daily as needed for allergies.    Marland Kitchen lidocaine-prilocaine (EMLA) cream Apply topically as needed. Use as directed on port site 1 hour prior to IV access 1 each 3  . lisinopril (PRINIVIL,ZESTRIL) 10 MG tablet Take 10 mg by mouth at bedtime.   6  . MELATONIN PO Take 5 mg by mouth at bedtime.     . metFORMIN (GLUCOPHAGE) 1000 MG tablet Take 1,000 mg by mouth 2 (two) times daily with a meal.    . metoprolol succinate (TOPROL-XL) 100 MG 24 hr tablet Take 100 mg by mouth every evening.     . Omega-3 Fatty Acids (FISH OIL PO) Take 1 capsule by mouth every evening.     . rosuvastatin (CRESTOR) 40 MG tablet Take by mouth daily. Pt takes 1/2 tablet($RemoveBeforeDE'20mg'OhcsGxExygpgsil$ ) on Mondays, Wednesdays,  Fridays.    Marland Kitchen HYDROcodone-homatropine (HYCODAN) 5-1.5 MG/5ML syrup Take 5 mLs by mouth every 6 (six) hours as needed for cough. (Patient not taking: Reported on 10/26/2014) 120 mL 0  . ibuprofen (ADVIL,MOTRIN) 200 MG tablet Take 400 mg by mouth every 6 (six) hours as needed for headache or moderate pain.    Marland Kitchen prochlorperazine (COMPAZINE) 10 MG tablet Take 1 tablet (10 mg total) by mouth every 6 (six) hours as needed (Nausea or vomiting). (Patient not taking: Reported on 10/26/2014) 30 tablet 1   No current facility-administered medications for this visit.    OBJECTIVE: Middle-aged white woman who appears stated age 64 Vitals:   12/28/14 0853  BP: 132/86  Pulse: 79  Temp: 98.7 F (37.1 C)  Resp: 20     Body mass index is 27.59 kg/(m^2).    ECOG FS:1 - Symptomatic but completely ambulatory  Skin: warm, dry  HEENT: sclerae anicteric, conjunctivae pink, oropharynx clear. No thrush or mucositis.  Lymph Nodes: No cervical or supraclavicular lymphadenopathy  Lungs:  clear to auscultation bilaterally, no rales, wheezes, or rhonci  Heart: regular rate and rhythm, systolic murmur Abdomen: round, soft, non tender, positive bowel sounds  Musculoskeletal: No focal spinal tenderness, no peripheral edema  Neuro: non focal, well oriented, positive affect  Breasts: deferred  LAB RESULTS:  CMP     Component Value Date/Time   NA 139 12/21/2014 0842   K 4.8 12/21/2014 0842   CO2 24 12/21/2014 0842   GLUCOSE 121 12/21/2014 0842   BUN 13.3 12/21/2014 0842   CREATININE 0.7 12/21/2014 0842   CALCIUM 9.7 12/21/2014 0842   PROT 6.4 12/21/2014 0842   ALBUMIN 3.9 12/21/2014 0842   AST 16 12/21/2014 0842   ALT 14 12/21/2014 0842   ALKPHOS 54 12/21/2014 0842   BILITOT 0.53 12/21/2014 0842    INo results found for: SPEP, UPEP  Lab Results  Component Value Date   WBC 3.6* 12/28/2014   NEUTROABS 2.5 12/28/2014   HGB 12.2 12/28/2014   HCT 35.8 12/28/2014   MCV 91.6 12/28/2014   PLT 224  12/28/2014      Chemistry      Component Value Date/Time   NA 139 12/21/2014 0842   K 4.8 12/21/2014 0842   CO2 24 12/21/2014 0842   BUN 13.3 12/21/2014 0842   CREATININE 0.7 12/21/2014 0842      Component Value Date/Time   CALCIUM 9.7 12/21/2014 0842   ALKPHOS 54 12/21/2014 0842   AST 16 12/21/2014 0842   ALT 14 12/21/2014 0842   BILITOT 0.53 12/21/2014 0842       No results found for: LABCA2  No components found for: LABCA125  No results for input(s): INR in the last 168 hours.  Urinalysis No results found for: COLORURINE, APPEARANCEUR, LABSPEC, PHURINE, GLUCOSEU, HGBUR, BILIRUBINUR, KETONESUR, PROTEINUR, UROBILINOGEN, NITRITE, LEUKOCYTESUR  STUDIES: No results found.  ASSESSMENT: 64 y.o. [place] woman status post right breast upper outer quadrant biopsy 07/13/2014, for a clinical T3 N0, stage IIB invasive ductal carcinoma, grade 3, estrogen receptor positive, HER-2 and progesterone receptor negative, with an MIB-1 of 40%  (1) neoadjuvant chemotherapy starting 08/15/2013, consisting of doxorubicin and cyclophosphamide in dose dense fashion 4, completed 09/28/2014, followed by weekly paclitaxel 12 starting 10/19/2014  (2) breast conserving surgery with sentinel lymph node sampling to follow chemotherapy  (3) adjuvant radiation to follow surgery  (4) anti-estrogens to follow radiation  PLAN: Sonnet has performed remarkably well throughout her entire course of chemo. The labs were reviewed in detail and were entirely stable. She will proceed with cycle 11 of paclitaxel as planned today.  She will have her final breast MRI this week, instead of at the completion of her chemotherapy because of insurance concerns. Her husband is starting a new job and their [employer] coverage is set to end on 9/30. Of course she has already reached her deductible and wishes to have her MRI covered under her current insurance plan.   Daijha will return in 1 week for her 12th  and final treatment. She understands and agrees with this plan. She knows the goal of treatment in her case is cure. She has been encouraged to call with any issues that might arise before her next visit here.   Laurie Panda, NP   12/28/2014 9:29 AM

## 2014-12-28 NOTE — Patient Instructions (Signed)
[facility] Discharge Instructions for Patients Receiving Chemotherapy  Today you received the following chemotherapy agents:  Taxol  To help prevent nausea and vomiting after your treatment, we encourage you to take your nausea medication as ordered per MD.   If you develop nausea and vomiting that is not controlled by your nausea medication, call the clinic.   BELOW ARE SYMPTOMS THAT SHOULD BE REPORTED IMMEDIATELY:  *FEVER GREATER THAN 100.5 F  *CHILLS WITH OR WITHOUT FEVER  NAUSEA AND VOMITING THAT IS NOT CONTROLLED WITH YOUR NAUSEA MEDICATION  *UNUSUAL SHORTNESS OF BREATH  *UNUSUAL BRUISING OR BLEEDING  TENDERNESS IN MOUTH AND THROAT WITH OR WITHOUT PRESENCE OF ULCERS  *URINARY PROBLEMS  *BOWEL PROBLEMS  UNUSUAL RASH Items with * indicate a potential emergency and should be followed up as soon as possible.  Feel free to call the clinic you have any questions or concerns. The clinic phone number is [phone].  Please show the CHEMO ALERT CARD at check-in to the Emergency Department and triage nurse.   

## 2014-12-28 NOTE — Progress Notes (Signed)
OK to treat today with CMET from 12/21/14 per H. Boelter NP.

## 2014-12-29 ENCOUNTER — Ambulatory Visit (HOSPITAL_COMMUNITY)
Admission: RE | Admit: 2014-12-29 | Discharge: 2014-12-29 | Disposition: A | Discharge status: Home / Self Care | Payer: 59 | Source: Ambulatory Visit | Attending: Nurse Practitioner | Admitting: Nurse Practitioner

## 2014-12-29 DIAGNOSIS — C50411 Malignant neoplasm of upper-outer quadrant of right female breast: Secondary | ICD-10-CM | POA: Insufficient documentation

## 2014-12-29 MED ORDER — GADOBENATE DIMEGLUMINE 529 MG/ML IV SOLN
15.0000 mL | Freq: Once | INTRAVENOUS | Status: AC | PRN
  Administered 2014-12-29: 14 mL via INTRAVENOUS

## 2015-01-04 ENCOUNTER — Ambulatory Visit: Payer: 59 | Admitting: Nurse Practitioner

## 2015-01-04 ENCOUNTER — Other Ambulatory Visit: Payer: 59

## 2015-01-04 ENCOUNTER — Telehealth: Payer: Self-pay | Admitting: Oncology

## 2015-01-04 ENCOUNTER — Ambulatory Visit (HOSPITAL_BASED_OUTPATIENT_CLINIC_OR_DEPARTMENT_OTHER): Payer: BLUE CROSS/BLUE SHIELD | Admitting: Oncology

## 2015-01-04 ENCOUNTER — Ambulatory Visit (HOSPITAL_BASED_OUTPATIENT_CLINIC_OR_DEPARTMENT_OTHER): Payer: BLUE CROSS/BLUE SHIELD

## 2015-01-04 ENCOUNTER — Other Ambulatory Visit (HOSPITAL_BASED_OUTPATIENT_CLINIC_OR_DEPARTMENT_OTHER): Payer: BLUE CROSS/BLUE SHIELD

## 2015-01-04 ENCOUNTER — Encounter: Payer: Self-pay | Admitting: *Deleted

## 2015-01-04 VITALS — BP 154/74 | HR 72 | Temp 98.9°F | Resp 18 | Ht 63.0 in | Wt 157.2 lb

## 2015-01-04 DIAGNOSIS — Z5111 Encounter for antineoplastic chemotherapy: Secondary | ICD-10-CM

## 2015-01-04 DIAGNOSIS — C50411 Malignant neoplasm of upper-outer quadrant of right female breast: Secondary | ICD-10-CM

## 2015-01-04 DIAGNOSIS — Z170 Estrogen receptor positive status [ER+]: Secondary | ICD-10-CM

## 2015-01-04 LAB — CBC WITH DIFFERENTIAL/PLATELET
BASO%: 0.4 % (ref 0.0–2.0)
BASOS ABS: 0 10*3/uL (ref 0.0–0.1)
EOS ABS: 0 10*3/uL (ref 0.0–0.5)
EOS%: 1 % (ref 0.0–7.0)
HEMATOCRIT: 35.6 % (ref 34.8–46.6)
HEMOGLOBIN: 12.3 g/dL (ref 11.6–15.9)
LYMPH#: 0.7 10*3/uL — AB (ref 0.9–3.3)
LYMPH%: 17.6 % (ref 14.0–49.7)
MCH: 31.6 pg (ref 25.1–34.0)
MCHC: 34.6 g/dL (ref 31.5–36.0)
MCV: 91.2 fL (ref 79.5–101.0)
MONO#: 0.4 10*3/uL (ref 0.1–0.9)
MONO%: 10.4 % (ref 0.0–14.0)
NEUT%: 70.6 % (ref 38.4–76.8)
NEUTROS ABS: 2.9 10*3/uL (ref 1.5–6.5)
Platelets: 229 10*3/uL (ref 145–400)
RBC: 3.9 10*6/uL (ref 3.70–5.45)
RDW: 15.3 % — AB (ref 11.2–14.5)
WBC: 4.1 10*3/uL (ref 3.9–10.3)

## 2015-01-04 LAB — COMPREHENSIVE METABOLIC PANEL (CC13)
ALT: 14 U/L (ref 0–55)
ANION GAP: 9 meq/L (ref 3–11)
AST: 16 U/L (ref 5–34)
Albumin: 3.9 g/dL (ref 3.5–5.0)
Alkaline Phosphatase: 57 U/L (ref 40–150)
BUN: 10.5 mg/dL (ref 7.0–26.0)
CHLORIDE: 108 meq/L (ref 98–109)
CO2: 24 meq/L (ref 22–29)
Calcium: 9.6 mg/dL (ref 8.4–10.4)
Creatinine: 0.7 mg/dL (ref 0.6–1.1)
Glucose: 123 mg/dl (ref 70–140)
Potassium: 4.5 mEq/L (ref 3.5–5.1)
Sodium: 141 mEq/L (ref 136–145)
Total Bilirubin: 0.64 mg/dL (ref 0.20–1.20)
Total Protein: 6.4 g/dL (ref 6.4–8.3)

## 2015-01-04 MED ORDER — SODIUM CHLORIDE 0.9 % IJ SOLN
10.0000 mL | INTRAMUSCULAR | Status: DC | PRN
  Administered 2015-01-04: 10 mL
  Filled 2015-01-04: qty 10

## 2015-01-04 MED ORDER — FAMOTIDINE IN NACL 20-0.9 MG/50ML-% IV SOLN
20.0000 mg | Freq: Once | INTRAVENOUS | Status: AC
  Administered 2015-01-04: 20 mg via INTRAVENOUS

## 2015-01-04 MED ORDER — PACLITAXEL CHEMO INJECTION 300 MG/50ML
80.0000 mg/m2 | Freq: Once | INTRAVENOUS | Status: AC
  Administered 2015-01-04: 144 mg via INTRAVENOUS
  Filled 2015-01-04: qty 24

## 2015-01-04 MED ORDER — HEPARIN SOD (PORK) LOCK FLUSH 100 UNIT/ML IV SOLN
500.0000 [IU] | Freq: Once | INTRAVENOUS | Status: AC | PRN
  Administered 2015-01-04: 500 [IU]
  Filled 2015-01-04: qty 5

## 2015-01-04 MED ORDER — SODIUM CHLORIDE 0.9 % IV SOLN
Freq: Once | INTRAVENOUS | Status: AC
  Administered 2015-01-04: 11:00:00 via INTRAVENOUS

## 2015-01-04 MED ORDER — FAMOTIDINE IN NACL 20-0.9 MG/50ML-% IV SOLN
INTRAVENOUS | Status: AC
  Filled 2015-01-04: qty 50

## 2015-01-04 MED ORDER — SODIUM CHLORIDE 0.9 % IV SOLN
Freq: Once | INTRAVENOUS | Status: AC
  Administered 2015-01-04: 12:00:00 via INTRAVENOUS
  Filled 2015-01-04: qty 4

## 2015-01-04 MED ORDER — DIPHENHYDRAMINE HCL 50 MG/ML IJ SOLN
25.0000 mg | Freq: Once | INTRAMUSCULAR | Status: AC
  Administered 2015-01-04: 25 mg via INTRAVENOUS

## 2015-01-04 MED ORDER — DIPHENHYDRAMINE HCL 50 MG/ML IJ SOLN
INTRAMUSCULAR | Status: AC
  Filled 2015-01-04: qty 1

## 2015-01-04 NOTE — Telephone Encounter (Signed)
Appointments made and avs printed for patient,rad onc will call patient with appointment as i have left them a message to call her   Darlene Kelly

## 2015-01-04 NOTE — Progress Notes (Signed)
[place]  Telephone:(336) [phone] Fax:(336) [phone]     ID: Darlene Kelly DOB: Jul 08, 1950  MR#: [id number]  ZMC#:[id number]  Patient Care Team: [name], MD as PCP - General (Family Medicine) [name], MD as Consulting Physician (General Surgery) [name], MD as Consulting Physician (Oncology) [name], MD as Consulting Physician (Radiation Oncology) [name], RN as Registered Nurse [name], RN as Registered Nurse [name], MD as Referring Physician (Cardiology) [name], MD as Referring Physician (Dermatology) [name], NP as Nurse Practitioner (Nurse Practitioner) PCP: [name], MD OTHER MD:  CHIEF COMPLAINT: Estrogen receptor positive breast cancer  CURRENT TREATMENT: Neoadjuvant chemotherapy  BREAST CANCER HISTORY: From the original intake note:  Darlene Kelly first noted a change in her right breast September 2015. At that time however she was consumed by the care of her mother-in-law, who had severe Alzheimer's disease. She finally passed in January 2016, but Darlene Kelly did not share the information regarding the right breast with her husband until late March 2016. At that point she was set up for bilateral diagnostic mammography with tomography at the breast Center, for 11/20/2014. This found the breast density to be category B. In the upper outer quadrant of the right breast there was a 4 cm irregular spiculated mass. There were no other findings of concern. The mass was palpable and firm, and by ultrasound measured 3.3 cm. The right axilla was negative sonographically.  Biopsy of the right breast mass in question was obtained for 03/22/2015. 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  On 07/20/2014 the patient underwent bilateral breast  MRI. This measured the large irregular enhancing mass in the right breast at the 10:00 position at 5.0 cm. There were no additional worrisome findings in the right breast, and no findings in the left breast or any abnormal appearing lymph nodes.  The patient's subsequent history is as detailed below  INTERVAL HISTORY: Darlene Kelly returns today for follow up of her breast cancer accompanied by her husband and daughter. Today is day 1 cycle 12 of 12 planned weekly cycles of paclitaxel.   REVIEW OF SYSTEMS: Darlene Kelly has done remarkably well with her Taxol treatments. Specifically she has experienced no peripheral neuropathy. Sometimes when she sits with her elbow on an oral chair she'll have a little bit of tingling in her fourth and fifth digits on the left, but obviously that is due to a temporary pressure on the ulnar nerve. Usually 3 or 4 days after Taxol treatments she will feel crampy and like she has the flu. She has been going to the gym about 5 days every week exercising 45 minutes to an hour on a regular basis. For the last few days she also has developed a rash in a sun exposure distribution. This is not itchy. A detailed review of systems is otherwise stable  PAST MEDICAL HISTORY: Past Medical History  Diagnosis Date  . Breast cancer of upper-outer quadrant of right female breast 07/16/2014  . Diabetes mellitus without complication   . Hypertension   . Skin cancer   . Heart murmur     PAST SURGICAL HISTORY: Past Surgical History  Procedure Laterality Date  . Portacath placement N/A 08/03/2014    Procedure: INSERTION PORT-A-CATH;  Surgeon: [name], MD;  Location: [facility];  Service: General;  Laterality: N/A;  FAMILY HISTORY Family History  Problem Relation Age of Onset  . Lung cancer Father    the patient's father died from lung cancer in the setting of tobacco abuse at the age of 25. The patient's mother died from COPD at the age of 63. The patient has 3 brothers, no  sisters. There is no history of breast or ovarian cancer in the family.  GYNECOLOGIC HISTORY:  No LMP recorded. Patient is postmenopausal. Menarche age 21, first live birth age 52. She is GX P3. She went through the change of life in her late 61s. She did not take hormone replacement.  SOCIAL HISTORY:  Darlene Kelly worked for [employer], but is now retired. Her husband [name] is Programmer, systems for a [employer]. They have been married more than 40 years. Son [name] "[name] was "'s Eimi Viney is a climbing Acupuncturist in [place]. Son [name] "map" Rakel Junio is a Dealer in [place]. Daughter Eulonda Andalon is a branch bank or for [employer] in [place]. The patient has 3 grandchildren.    ADVANCED DIRECTIVES: In place   HEALTH MAINTENANCE: Social History  Substance Use Topics  . Smoking status: Former Smoker -- 0.50 packs/day for 12 years    Types: Cigarettes    Quit date: 04/02/1986  . Smokeless tobacco: Never Used  . Alcohol Use: 1.8 oz/week    3 Glasses of wine per week     Colonoscopy: Never  PAP:  Bone density: Never  Lipid panel:  Allergies  Allergen Reactions  . Latex Itching, Dermatitis and Rash  . Lipitor [Atorvastatin] Other (See Comments)    Bad leg cramps    Current Outpatient Prescriptions  Medication Sig Dispense Refill  . aspirin EC 81 MG tablet Take 81 mg by mouth at bedtime.     . cetirizine (ZYRTEC) 10 MG tablet Take 10 mg by mouth daily as needed for allergies.    Marland Kitchen HYDROcodone-homatropine (HYCODAN) 5-1.5 MG/5ML syrup Take 5 mLs by mouth every 6 (six) hours as needed for cough. (Patient not taking: Reported on 10/26/2014) 120 mL 0  . ibuprofen (ADVIL,MOTRIN) 200 MG tablet Take 400 mg by mouth every 6 (six) hours as needed for headache or moderate pain.    Marland Kitchen lidocaine-prilocaine (EMLA) cream Apply topically as needed. Use as directed on port site 1 hour prior to IV access 1 each 3  . lisinopril (PRINIVIL,ZESTRIL) 10 MG tablet  Take 10 mg by mouth at bedtime.   6  . MELATONIN PO Take 5 mg by mouth at bedtime.     . metFORMIN (GLUCOPHAGE) 1000 MG tablet Take 1,000 mg by mouth 2 (two) times daily with a meal.    . metoprolol succinate (TOPROL-XL) 100 MG 24 hr tablet Take 100 mg by mouth every evening.     . Omega-3 Fatty Acids (FISH OIL PO) Take 1 capsule by mouth every evening.     . prochlorperazine (COMPAZINE) 10 MG tablet Take 1 tablet (10 mg total) by mouth every 6 (six) hours as needed (Nausea or vomiting). (Patient not taking: Reported on 10/26/2014) 30 tablet 1  . rosuvastatin (CRESTOR) 40 MG tablet Take by mouth daily. Pt takes 1/2 tablet(7m) on Mondays, Wednesdays, Fridays.     No current facility-administered medications for this visit.    OBJECTIVE: Middle-aged white woman in no acute distress Filed Vitals:   01/04/15 0943  BP: 154/74  Pulse: 72  Temp: 98.9 F (37.2 C)  Resp: 18     Body mass  index is 27.85 kg/(m^2).    ECOG FS:0 - Asymptomatic  Sclerae unicteric, EOMs intact Oropharynx clear and moist deferred No cervical or supraclavicular adenopathy Lungs no rales or rhonchi Heart regular rate and rhythm Abd soft, nontender, positive bowel sounds MSK no focal spinal tenderness, no upper extremity lymphedema Neuro: nonfocal, well oriented, appropriate affect Breasts:    LAB RESULTS:  CMP     Component Value Date/Time   NA 139 12/21/2014 0842   K 4.8 12/21/2014 0842   CO2 24 12/21/2014 0842   GLUCOSE 121 12/21/2014 0842   BUN 13.3 12/21/2014 0842   CREATININE 0.7 12/21/2014 0842   CALCIUM 9.7 12/21/2014 0842   PROT 6.4 12/21/2014 0842   ALBUMIN 3.9 12/21/2014 0842   AST 16 12/21/2014 0842   ALT 14 12/21/2014 0842   ALKPHOS 54 12/21/2014 0842   BILITOT 0.53 12/21/2014 0842    INo results found for: SPEP, UPEP  Lab Results  Component Value Date   WBC 4.1 01/04/2015   NEUTROABS 2.9 01/04/2015   HGB 12.3 01/04/2015   HCT 35.6 01/04/2015   MCV 91.2 01/04/2015   PLT 229  01/04/2015      Chemistry      Component Value Date/Time   NA 139 12/21/2014 0842   K 4.8 12/21/2014 0842   CO2 24 12/21/2014 0842   BUN 13.3 12/21/2014 0842   CREATININE 0.7 12/21/2014 0842      Component Value Date/Time   CALCIUM 9.7 12/21/2014 0842   ALKPHOS 54 12/21/2014 0842   AST 16 12/21/2014 0842   ALT 14 12/21/2014 0842   BILITOT 0.53 12/21/2014 0842       No results found for: LABCA2  No components found for: LABCA125  No results for input(s): INR in the last 168 hours.  Urinalysis No results found for: COLORURINE, APPEARANCEUR, LABSPEC, PHURINE, GLUCOSEU, HGBUR, BILIRUBINUR, KETONESUR, PROTEINUR, UROBILINOGEN, NITRITE, LEUKOCYTESUR  STUDIES: Mr Breast Bilateral W Wo Contrast  12/29/2014   CLINICAL DATA:  64 year old female with known right breast cancer. Evaluate response to chemotherapy.  LABS:  No labs were performed at the imaging center today.  EXAM: BILATERAL BREAST MRI WITH AND WITHOUT CONTRAST  TECHNIQUE: Multiplanar, multisequence MR images of both breasts were obtained prior to and following the intravenous administration of 14 ml of MultiHance.  THREE-DIMENSIONAL MR IMAGE RENDERING ON INDEPENDENT WORKSTATION:  Three-dimensional MR images were rendered by post-processing of the original MR data on an independent workstation. The three-dimensional MR images were interpreted, and findings are reported in the following complete MRI report for this study. Three dimensional images were evaluated at the independent DynaCad workstation  COMPARISON:  Breast MRIs dated 10/11/2014 and 07/20/2014  FINDINGS: Breast composition: b. Scattered fibroglandular tissue.  Background parenchymal enhancement: Mild  Right breast: There has been an interval decrease in enhancement of the known carcinoma within the upper, outer right breast. On the initial postcontrast imaging, there is a 6 x 5 x 2 mm area of enhancement along the the medial aspect of the mass. This is compared to a 2.6 x  2.7 x 2.7 cm area of enhancement on the initial post contrast imaging on the July 2016 study. Additional scattered foci of enhancement are seen within the mass on the more delayed postcontrast imaging of today's study. Biopsy marker artifact is noted along the anterior aspect of the mass (series 3, image 147).  Left breast: No suspicious mass or enhancement.  Lymph nodes: No abnormal appearing lymph nodes.  Ancillary findings:  A left  Port-A-Cath is present  IMPRESSION: Continued interval decrease in enhancement of the known carcinoma centered within the upper, outer right breast. Minimal residual enhancement is seen.  RECOMMENDATION: Treatment plan.  BI-RADS CATEGORY  6: Known biopsy-proven malignancy.   Electronically Signed   By: Pamelia Hoit M.D.   On: 12/29/2014 18:30    ASSESSMENT: 64 y.o. [place] woman status post right breast upper outer quadrant biopsy 07/13/2014, for a clinical T3 N0, stage IIB invasive ductal carcinoma, grade 3, estrogen receptor positive, HER-2 and progesterone receptor negative, with an MIB-1 of 40%  (1) neoadjuvant chemotherapy starting 08/15/2013, consisting of doxorubicin and cyclophosphamide in dose dense fashion 4, completed 09/28/2014, followed by weekly paclitaxel 12 starting 10/19/2014, completed 01/04/2015  (2) breast conserving surgery with sentinel lymph node sampling to follow chemotherapy  (3) adjuvant radiation to follow surgery  (4) anti-estrogens to follow radiation  PLAN: Darlene Kelly completes her neoadjuvant chemotherapy today. We reviewed the images of her before and after MRIs, and the result is very favorable. She understands the main purpose of chemotherapy is to sterilize systemic disease and if we are doing so well on her large breast mass, which is bili and some cells in it, we should do very well with anything that might be hiding elsewhere in her body, which would be of course a much smaller deposit.  She is now ready for surgery and she will be  meeting with Dr. [name] hopefully sometime next week at to discuss timing and other issues regarding the anticipated procedure. I am requesting an appointment with Dr. Pablo Ledger sometime late this month. By that time she should be recovering from the surgery and can start planning the radiation to follow. Of course she will start anti-estrogens only after she completes the radiation treatments  She will be going to the beach tomorrow. She is aware of the fact that chemotherapy is a son sensitizer and she will cover her stop up pretty well as well as use sunscreen if she is going to be outside for any length of time.  Darlene Kelly has a good understanding of the overall plan. She agrees with it. She knows a goal of treatment in her case is cure. She will call with any problems that may develop before her next visit here.  [name], MD   01/04/2015 9:47 AM

## 2015-01-04 NOTE — Patient Instructions (Signed)
[facility] Discharge Instructions for Patients Receiving Chemotherapy  Today you received the following chemotherapy agents:  Taxol  To help prevent nausea and vomiting after your treatment, we encourage you to take your nausea medication as ordered per MD.   If you develop nausea and vomiting that is not controlled by your nausea medication, call the clinic.   BELOW ARE SYMPTOMS THAT SHOULD BE REPORTED IMMEDIATELY:  *FEVER GREATER THAN 100.5 F  *CHILLS WITH OR WITHOUT FEVER  NAUSEA AND VOMITING THAT IS NOT CONTROLLED WITH YOUR NAUSEA MEDICATION  *UNUSUAL SHORTNESS OF BREATH  *UNUSUAL BRUISING OR BLEEDING  TENDERNESS IN MOUTH AND THROAT WITH OR WITHOUT PRESENCE OF ULCERS  *URINARY PROBLEMS  *BOWEL PROBLEMS  UNUSUAL RASH Items with * indicate a potential emergency and should be followed up as soon as possible.  Feel free to call the clinic you have any questions or concerns. The clinic phone number is [phone].  Please show the CHEMO ALERT CARD at check-in to the Emergency Department and triage nurse.   

## 2015-01-05 ENCOUNTER — Ambulatory Visit: Payer: BLUE CROSS/BLUE SHIELD | Admitting: Radiation Oncology

## 2015-01-11 ENCOUNTER — Other Ambulatory Visit (HOSPITAL_BASED_OUTPATIENT_CLINIC_OR_DEPARTMENT_OTHER): Payer: BLUE CROSS/BLUE SHIELD

## 2015-01-11 DIAGNOSIS — C50411 Malignant neoplasm of upper-outer quadrant of right female breast: Secondary | ICD-10-CM

## 2015-01-11 LAB — CBC WITH DIFFERENTIAL/PLATELET
BASO%: 0.3 % (ref 0.0–2.0)
Basophils Absolute: 0 10*3/uL (ref 0.0–0.1)
EOS ABS: 0 10*3/uL (ref 0.0–0.5)
EOS%: 1.1 % (ref 0.0–7.0)
HCT: 34.3 % — ABNORMAL LOW (ref 34.8–46.6)
HEMOGLOBIN: 11.7 g/dL (ref 11.6–15.9)
LYMPH#: 0.7 10*3/uL — AB (ref 0.9–3.3)
LYMPH%: 18.2 % (ref 14.0–49.7)
MCH: 31.1 pg (ref 25.1–34.0)
MCHC: 34.1 g/dL (ref 31.5–36.0)
MCV: 91.2 fL (ref 79.5–101.0)
MONO#: 0.3 10*3/uL (ref 0.1–0.9)
MONO%: 8.7 % (ref 0.0–14.0)
NEUT%: 71.7 % (ref 38.4–76.8)
NEUTROS ABS: 2.7 10*3/uL (ref 1.5–6.5)
Platelets: 206 10*3/uL (ref 145–400)
RBC: 3.76 10*6/uL (ref 3.70–5.45)
RDW: 14.6 % — AB (ref 11.2–14.5)
WBC: 3.7 10*3/uL — AB (ref 3.9–10.3)

## 2015-01-12 ENCOUNTER — Other Ambulatory Visit: Payer: Self-pay | Admitting: General Surgery

## 2015-01-12 DIAGNOSIS — C50411 Malignant neoplasm of upper-outer quadrant of right female breast: Secondary | ICD-10-CM

## 2015-01-19 ENCOUNTER — Telehealth: Payer: Self-pay | Admitting: Oncology

## 2015-01-19 ENCOUNTER — Other Ambulatory Visit: Payer: Self-pay | Admitting: General Surgery

## 2015-01-19 DIAGNOSIS — C50411 Malignant neoplasm of upper-outer quadrant of right female breast: Secondary | ICD-10-CM

## 2015-01-19 NOTE — Telephone Encounter (Signed)
Patient stopped by today to check on 3 mth f/u. Patient last seen 10/4 and per 10/4 pof - lab 10/11 and see Dr. Pablo Ledger end of October - no info re when to see GM. Lab complete 10/11 - patient on schedule with Dr. Pablo Ledger - per patient was rescheduled by radonc. Message to [facility] re when patient is to see him. Message to GM.

## 2015-01-20 ENCOUNTER — Other Ambulatory Visit: Payer: Self-pay | Admitting: Oncology

## 2015-01-20 ENCOUNTER — Telehealth: Payer: Self-pay | Admitting: Oncology

## 2015-01-20 NOTE — Telephone Encounter (Signed)
Patient aware of 11/22 appointment

## 2015-01-26 ENCOUNTER — Ambulatory Visit: Payer: BLUE CROSS/BLUE SHIELD | Admitting: Radiation Oncology

## 2015-01-26 ENCOUNTER — Ambulatory Visit: Payer: BLUE CROSS/BLUE SHIELD

## 2015-02-03 ENCOUNTER — Encounter (HOSPITAL_COMMUNITY)
Admission: RE | Admit: 2015-02-03 | Discharge: 2015-02-03 | Disposition: A | Discharge status: Home / Self Care | Payer: BLUE CROSS/BLUE SHIELD | Source: Ambulatory Visit | Attending: General Surgery | Admitting: General Surgery

## 2015-02-03 ENCOUNTER — Encounter (HOSPITAL_COMMUNITY)
Admission: RE | Admit: 2015-02-03 | Discharge: 2015-02-03 | Disposition: A | Discharge status: Home / Self Care | Payer: BLUE CROSS/BLUE SHIELD | Source: Ambulatory Visit | Attending: Anesthesiology | Admitting: Anesthesiology

## 2015-02-03 DIAGNOSIS — Z01812 Encounter for preprocedural laboratory examination: Secondary | ICD-10-CM | POA: Diagnosis not present

## 2015-02-03 DIAGNOSIS — Z01818 Encounter for other preprocedural examination: Secondary | ICD-10-CM | POA: Insufficient documentation

## 2015-02-03 DIAGNOSIS — C50411 Malignant neoplasm of upper-outer quadrant of right female breast: Secondary | ICD-10-CM | POA: Diagnosis not present

## 2015-02-03 DIAGNOSIS — I10 Essential (primary) hypertension: Secondary | ICD-10-CM | POA: Insufficient documentation

## 2015-02-03 DIAGNOSIS — K449 Diaphragmatic hernia without obstruction or gangrene: Secondary | ICD-10-CM | POA: Insufficient documentation

## 2015-02-03 LAB — CBC
HCT: 39.5 % (ref 36.0–46.0)
HEMOGLOBIN: 13.7 g/dL (ref 12.0–15.0)
MCH: 30.5 pg (ref 26.0–34.0)
MCHC: 34.7 g/dL (ref 30.0–36.0)
MCV: 88 fL (ref 78.0–100.0)
Platelets: 233 10*3/uL (ref 150–400)
RBC: 4.49 MIL/uL (ref 3.87–5.11)
RDW: 13.9 % (ref 11.5–15.5)
WBC: 7.6 10*3/uL (ref 4.0–10.5)

## 2015-02-03 LAB — BASIC METABOLIC PANEL
ANION GAP: 10 (ref 5–15)
BUN: 12 mg/dL (ref 6–20)
CHLORIDE: 103 mmol/L (ref 101–111)
CO2: 25 mmol/L (ref 22–32)
Calcium: 10.1 mg/dL (ref 8.9–10.3)
Creatinine, Ser: 0.63 mg/dL (ref 0.44–1.00)
GFR calc non Af Amer: >60 mL/min (ref >60)
Glucose, Bld: 124 mg/dL — ABNORMAL HIGH (ref 65–99)
POTASSIUM: 4.6 mmol/L (ref 3.5–5.1)
Sodium: 138 mmol/L (ref 135–145)

## 2015-02-03 LAB — GLUCOSE, CAPILLARY: Glucose-Capillary: 124 mg/dL — ABNORMAL HIGH (ref 65–99)

## 2015-02-03 MED ORDER — CHLORHEXIDINE GLUCONATE 4 % EX LIQD: 1.0000 "application " | Freq: Once | CUTANEOUS | Status: DC

## 2015-02-03 NOTE — Pre-Procedure Instructions (Signed)
Darlene Kelly  02/03/2015      [id number] - [place], [place] [address] [place] [address] Phone: [phone] Fax: [phone]    Your procedure is scheduled on Friday February 11, 2015 .  Report to [facility] Admitting at 5:30A.M.  Call this number if you have problems the morning of surgery:  [phone]   Remember:  Do not eat food or drink liquids after midnight.  Take these medicines the morning of surgery with A SIP OF WATER  : Toprol XL (metoprolol)  Discontinue all aspirin products, Goody and BC powders, ibuprofen, naproxyn, and other NSAIDS, as well as herbal medications, vitamins and fish oils five days prior to surgery.    How to Manage Your Diabetes Before Surgery  Why is it important to control my blood sugar before and after surgery?   Improving blood sugar levels before and after surgery helps healing and can limit problems.  A way of improving blood sugar control is eating a healthy diet by:  - Eating less sugar and carbohydrates  - Increasing activity/exercise  - Talk with your doctor about reaching your blood sugar goals  High blood sugars (greater than 180 mg/dL) can raise your risk of infections and slow down your recovery so you will need to focus on controlling your diabetes during the weeks before surgery.  Make sure that the doctor who takes care of your diabetes knows about your planned surgery including the date and location.  How do I manage my blood sugars before surgery?   Check your blood sugar at least 4 times a day, 2 days before surgery to make sure that they are not too high or low.   Check your blood sugar the morning of your surgery when you wake up and every 2               hours until you get to the Short-Stay unit.  If your blood sugar is less than 70 mg/dL, you will need to treat for low blood sugar by:  Treat a low blood sugar (less  than 70 mg/dL) with 1/2 cup of clear juice (cranberry or apple), 4 glucose tablets, OR glucose gel.  Recheck blood sugar in 15 minutes after treatment (to make sure it is greater than 70 mg/dL).  If blood sugar is not greater than 70 mg/dL on re-check, call [phone] for further instructions.   Report your blood sugar to the Short-Stay nurse when you get to Short-Stay.  References:  [facility], 2007 "How to Manage your Diabetes Before and After Surgery".  What do I do about my diabetes medications?   Do not take oral diabetes medicines (pills) the morning of surgery.       Do not wear jewelry, make-up or nail polish.  Do not wear lotions, powders, or perfumes.  You may NOT wear deodorant.  Do not shave 48 hours prior to surgery.    Do not bring valuables to the hospital.  [facility] is not responsible for any belongings or valuables.  Contacts, dentures or bridgework may not be worn into surgery.  Leave your suitcase in the car.  After surgery it may be brought to your room.  For patients admitted to the hospital, discharge time will be determined by your treatment team.  Patients discharged the day of surgery will not be allowed to drive home.    Special instructions:  [facility] - Preparing for Surgery  Before surgery, you can play an important role.  Because skin is not sterile, your skin needs to be as free of germs as possible.  You can reduce the number of germs on you skin by washing with CHG (chlorahexidine gluconate) soap before surgery.  CHG is an antiseptic cleaner which kills germs and bonds with the skin to continue killing germs even after washing.  Please DO NOT use if you have an allergy to CHG or antibacterial soaps.  If your skin becomes reddened/irritated stop using the CHG and inform your nurse when you arrive at Short Stay.  Do not shave (including legs and underarms) for at least 48 hours prior to the first CHG shower.  You may  shave your face.  Please follow these instructions carefully:   1.  Shower with CHG Soap the night before surgery and the                                morning of Surgery.  2.  If you choose to wash your hair, wash your hair first as usual with your       normal shampoo.  3.  After you shampoo, rinse your hair and body thoroughly to remove the                      Shampoo.  4.  Use CHG as you would any other liquid soap.  You can apply chg directly       to the skin and wash gently with scrungie or a clean washcloth.  5.  Apply the CHG Soap to your body ONLY FROM THE NECK DOWN.        Do not use on open wounds or open sores.  Avoid contact with your eyes,       ears, mouth and genitals (private parts).  Wash genitals (private parts)       with your normal soap.  6.  Wash thoroughly, paying special attention to the area where your surgery        will be performed.  7.  Thoroughly rinse your body with warm water from the neck down.  8.  DO NOT shower/wash with your normal soap after using and rinsing off       the CHG Soap.  9.  Pat yourself dry with a clean towel.            10.  Wear clean pajamas.            11.  Place clean sheets on your bed the night of your first shower and do not        sleep with pets.  Day of Surgery  Do not apply any lotions/deoderants the morning of surgery.  Please wear clean clothes to the hospital/surgery center.    Please read over the following fact sheets that you were given. Pain Booklet, Coughing and Deep Breathing, MRSA Information and Surgical Site Infection Prevention

## 2015-02-04 ENCOUNTER — Telehealth: Payer: Self-pay | Admitting: *Deleted

## 2015-02-04 LAB — HEMOGLOBIN A1C
HEMOGLOBIN A1C: 5.3 % (ref 4.8–5.6)
MEAN PLASMA GLUCOSE: 105 mg/dL

## 2015-02-04 NOTE — Telephone Encounter (Signed)
I spoke to patient  to inform her that the [facility] was ending I asked patient not to take any more of her medications and we would call her next week to schedule close-out visit. Patient verbalized understanding.

## 2015-02-07 ENCOUNTER — Ambulatory Visit
Admission: RE | Admit: 2015-02-07 | Discharge: 2015-02-07 | Disposition: A | Discharge status: Home / Self Care | Payer: BLUE CROSS/BLUE SHIELD | Source: Ambulatory Visit | Attending: General Surgery | Admitting: General Surgery

## 2015-02-07 DIAGNOSIS — C50411 Malignant neoplasm of upper-outer quadrant of right female breast: Secondary | ICD-10-CM

## 2015-02-07 NOTE — Progress Notes (Signed)
As I f/u to my call, pt. Called back to report that she is seen by Dr. Vear Clock at [facility].  Call to staff at [facility] & requested, EKG, advanced cardiac testing & last OVnote.

## 2015-02-09 NOTE — Progress Notes (Signed)
Patient picked up requested excuse letter for jury duty today.

## 2015-02-10 MED ORDER — CEFAZOLIN SODIUM-DEXTROSE 2-3 GM-% IV SOLR
2.0000 g | INTRAVENOUS | Status: AC
  Administered 2015-02-11: 2 g via INTRAVENOUS
  Filled 2015-02-10: qty 50

## 2015-02-10 NOTE — H&P (Signed)
History of Present Illness Darlene Kitchen T. Marjarie Irion MD; 01/12/2015 10:10 AM) Patient words: breast f/u.  The patient is a 64 year old female who presents with breast cancer. 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 She tolerated her chemotherapy very well. Her original presentation was as follows:  She is a post menopausal female referred by Dr. Conchita Paris for evaluation of recently diagnosed carcinoma of the right breast. she had not had a breast screening in recent years. She recently was able to feel a mass in the right breast. She subsequently was referred to the breast center for evaluation. Subsequent imaging included diagnostic mamogram showing a 4 cm irregular spiculated mass in the upper outer right breast middle depth and ultrasound showing a 3.2 x 3.3 x 3.0 irregular hypoechlic mass at the 16:10 position 6 cm from the nipple. An ultrasound guided breast biopsy was performed on 07/13/2014 with pathology revealing invasive ductal carcinoma of the breast. subsequent bilateral breast MRI showed a large irregular enhancing mass in the upper outer right breast measuring 5 cm in maximum diameter and no other worrisome abnormalities in either breast or any evidence of adenopathy. She is seen now in breast multidisciplinary clinic for initial treatment planning. She has experienced a lump but no other symptoms such as pain, skin changes or nipple retraction or discharge. She does not have a personal history of any previous breast problems.  Findings at that time were the following: Tumor size: 5 cm Tumor grade: III Estrogen Receptor: Pos Progesterone Receptor: Neg Her-2 neu: Neg Lymph node status: Neg  At this point she can no longer feel any mass in her breast. She has had a follow-up MRI: Right breast: There has been an interval decrease in enhancement of the known  carcinoma within the upper, outer right breast. On the initial postcontrast imaging, there is a 6 x 5 x 2 mm area of enhancement along the the medial aspect of the mass. This is compared to a 2.6 x 2.7 x 2.7 cm area of enhancement on the initial post contrast imaging on the July 2016 study. Additional scattered foci of enhancement are seen within the mass on the more delayed postcontrast imaging of today's study. Biopsy marker artifact is noted along the anterior aspect of the mass (series 3, image 147).  Left breast: No suspicious mass or enhancement.  Lymph nodes: No abnormal appearing lymph nodes.  Ancillary findings: A left Port-A-Cath is present  IMPRESSION: Continued interval decrease in enhancement of the known carcinoma centered within the upper, outer right breast. Minimal residual enhancement is seen.   Other Problems Darlene Kelly, CMA; 01/12/2015 9:59 AM) Breast Cancer Heart murmur High blood pressure Kidney Stone Lump In Breast  Past Surgical History Darlene Kelly, CMA; 01/12/2015 9:59 AM) Breast Biopsy Right.  Diagnostic Studies History Darlene Kelly, CMA; 01/12/2015 9:59 AM) Colonoscopy never Mammogram within last year Pap Smear >5 years ago  Allergies Darlene Kelly, CMA; 01/12/2015 9:59 AM) Latex Lipitor *ANTIHYPERLIPIDEMICS*  Medication History (Darlene Kelly, CMA; 01/12/2015 10:01 AM) Lisinopril (10MG Tablet, Oral) Active. MetFORMIN HCl (1000MG Tablet, Oral) Active. Metoprolol Succinate ER (100MG Tablet ER 24HR, Oral) Active. Aspirin (81MG Tablet, Oral) Active. ZyrTEC (10MG Tablet, Oral) Active. Ibuprofen 200 (200MG Tablet, Oral) Active. Melatonin (1MG Tablet, Oral) Active. Fish Oil (1000MG Capsule, Oral) Active. Crestor (40MG Tablet, Oral) Active.  Social History Darlene Kelly, CMA; 01/12/2015 9:59 AM) Alcohol use Occasional alcohol use. Caffeine  use Coffee, Tea. No drug use Tobacco use Former smoker.  Family  History Darlene Kelly, CMA; 01/12/2015 9:59 AM) Cancer Father. Heart Disease Brother, Father, Mother. Heart disease in female family member before age 38 Prostate Cancer Brother. Respiratory Condition Father, Mother.  Pregnancy / Birth History Darlene Kelly, CMA; 01/12/2015 9:59 AM) Age at menarche 59 years. Age of menopause <45 51-55 Contraceptive History Oral contraceptives. Gravida 4 Maternal age 63-30 Para 3    Review of Systems Darlene Kelly CMA; 01/12/2015 9:59 AM) General Not Present- Appetite Loss, Chills, Fatigue, Fever, Night Sweats, Weight Gain and Weight Loss. Skin Not Present- Change in Wart/Mole, Dryness, Hives, Jaundice, New Lesions, Non-Healing Wounds, Rash and Ulcer. HEENT Present- Seasonal Allergies and Wears glasses/contact lenses. Not Present- Earache, Hearing Loss, Hoarseness, Nose Bleed, Oral Ulcers, Ringing in the Ears, Sinus Pain, Sore Throat, Visual Disturbances and Yellow Eyes. Respiratory Not Present- Bloody sputum, Chronic Cough, Difficulty Breathing, Snoring and Wheezing. Breast Present- Breast Mass. Not Present- Breast Pain, Nipple Discharge and Skin Changes. Cardiovascular Not Present- Chest Pain, Difficulty Breathing Lying Down, Leg Cramps, Palpitations, Rapid Heart Rate, Shortness of Breath and Swelling of Extremities. Gastrointestinal Not Present- Abdominal Pain, Bloating, Bloody Stool, Change in Bowel Habits, Chronic diarrhea, Constipation, Difficulty Swallowing, Excessive gas, Gets full quickly at meals, Hemorrhoids, Indigestion, Nausea, Rectal Pain and Vomiting. Female Genitourinary Not Present- Frequency, Nocturia, Painful Urination, Pelvic Pain and Urgency. Musculoskeletal Not Present- Back Pain, Joint Pain, Joint Stiffness, Muscle Pain, Muscle Weakness and Swelling of Extremities. Neurological Not Present- Decreased Memory, Fainting, Headaches, Numbness, Seizures, Tingling, Tremor, Trouble walking and Weakness. Psychiatric Not Present-  Anxiety, Bipolar, Change in Sleep Pattern, Depression, Fearful and Frequent crying. Endocrine Not Present- Cold Intolerance, Excessive Hunger, Hair Changes, Heat Intolerance, Hot flashes and New Diabetes. Hematology Not Present- Easy Bruising, Excessive bleeding, Gland problems, HIV and Persistent Infections.  Vitals (Darlene Kelly CMA; 01/12/2015 9:59 AM) 01/12/2015 9:58 AM Weight: 160 lb Temp.: 98.14F(Oral)  Pulse: 76 (Regular)  BP: 118/78 (Sitting, Left Arm, Standard)     Physical Exam Darlene Kitchen T. Larosa Rhines MD; 01/12/2015 10:11 AM) The physical exam findings are as follows: Note:General: Alert, well-developed and well nourished Caucasian female, in no distress Skin: Warm and dry without rash or infection. HEENT: No palpable masses or thyromegaly. Sclera nonicteric. Pupils equal round and reactive. Oropharynx clear. Lymph nodes: No cervical, supraclavicular, or inguinal nodes palpable. Breasts: I cannot feel any mass in either breast. No skin changes or nipple discharge. Lungs: Breath sounds clear and equal. No wheezing or increased work of breathing. Cardiovascular: Regular rate and rhythm without murmer. Extremities: No edema or joint swelling or deformity. No chronic venous stasis changes. Neurologic: Alert and fully oriented. Gait normal. No focal weakness. Psychiatric: Normal mood and affect. Thought content appropriate with normal judgement and insight    Assessment & Plan Darlene Kitchen T. Asra Gambrel MD; 01/12/2015 10:22 AM) MALIGNANT NEOPLASM OF UPPER-OUTER QUADRANT OF RIGHT FEMALE BREAST (C50.411) Impression: 64 year old female with a new diagnosis of cancer of the right breast, upper outer quadrant. Clinical stage II, T2N0M0 ER Pos, PR Neg, HER-2 Neg. she has had an excellent response to chemotherapy. She no longer has any palpable mass. MRI shows a few scattered areas of minimal enhancement but still encompassed a total of several centimeters but hard to know if this  represents viable tumor or not. She certainly would appear to be a very good candidate for breast conservation as originally planned and that's what she would prefer. We will plan to schedule her for  radioactive seed localized right breast lumpectomy and right axillary sentinel lymph node biopsy as an outpatient under general anesthesia. We again discussed the procedure in detail including its indications and nature, expected recovery, risks of bleeding, infection, anesthetic complications, slight risk of lymphedema and possible need for further surgery based on final pathology. All her questions were answered. Current Plans Schedule for Surgery Radioactive seed localized right breast lumpectomy and right axillary sentinel lymph node biopsy and removal of Port-A-Cath under general anesthesia as an outpatient  Pt Education - [facility] Breast Biopsy HCI: discussed with patient and provided information.

## 2015-02-11 ENCOUNTER — Ambulatory Visit (HOSPITAL_COMMUNITY)
Admission: RE | Admit: 2015-02-11 | Discharge: 2015-02-11 | Disposition: A | Discharge status: Home / Self Care | Payer: BLUE CROSS/BLUE SHIELD | Source: Ambulatory Visit | Attending: General Surgery | Admitting: General Surgery

## 2015-02-11 ENCOUNTER — Ambulatory Visit
Admission: RE | Admit: 2015-02-11 | Discharge: 2015-02-11 | Disposition: A | Discharge status: Home / Self Care | Payer: BLUE CROSS/BLUE SHIELD | Source: Ambulatory Visit | Attending: General Surgery | Admitting: General Surgery

## 2015-02-11 ENCOUNTER — Encounter (HOSPITAL_COMMUNITY): Payer: Self-pay | Admitting: Surgery

## 2015-02-11 ENCOUNTER — Encounter (HOSPITAL_COMMUNITY)
Admission: RE | Disposition: A | Discharge status: Home / Self Care | Payer: Self-pay | Source: Ambulatory Visit | Attending: General Surgery

## 2015-02-11 ENCOUNTER — Ambulatory Visit (HOSPITAL_COMMUNITY): Payer: BLUE CROSS/BLUE SHIELD | Admitting: Anesthesiology

## 2015-02-11 DIAGNOSIS — Z170 Estrogen receptor positive status [ER+]: Secondary | ICD-10-CM | POA: Insufficient documentation

## 2015-02-11 DIAGNOSIS — C50411 Malignant neoplasm of upper-outer quadrant of right female breast: Secondary | ICD-10-CM

## 2015-02-11 DIAGNOSIS — Z9221 Personal history of antineoplastic chemotherapy: Secondary | ICD-10-CM | POA: Diagnosis not present

## 2015-02-11 DIAGNOSIS — E119 Type 2 diabetes mellitus without complications: Secondary | ICD-10-CM | POA: Insufficient documentation

## 2015-02-11 DIAGNOSIS — Z791 Long term (current) use of non-steroidal anti-inflammatories (NSAID): Secondary | ICD-10-CM | POA: Insufficient documentation

## 2015-02-11 DIAGNOSIS — Z452 Encounter for adjustment and management of vascular access device: Secondary | ICD-10-CM | POA: Insufficient documentation

## 2015-02-11 DIAGNOSIS — Z7982 Long term (current) use of aspirin: Secondary | ICD-10-CM | POA: Insufficient documentation

## 2015-02-11 DIAGNOSIS — I10 Essential (primary) hypertension: Secondary | ICD-10-CM | POA: Insufficient documentation

## 2015-02-11 DIAGNOSIS — Z7984 Long term (current) use of oral hypoglycemic drugs: Secondary | ICD-10-CM | POA: Diagnosis not present

## 2015-02-11 DIAGNOSIS — Z79899 Other long term (current) drug therapy: Secondary | ICD-10-CM | POA: Insufficient documentation

## 2015-02-11 DIAGNOSIS — Z87891 Personal history of nicotine dependence: Secondary | ICD-10-CM | POA: Insufficient documentation

## 2015-02-11 DIAGNOSIS — C50911 Malignant neoplasm of unspecified site of right female breast: Secondary | ICD-10-CM | POA: Diagnosis present

## 2015-02-11 HISTORY — PX: RADIOACTIVE SEED GUIDED PARTIAL MASTECTOMY WITH AXILLARY SENTINEL LYMPH NODE BIOPSY: SHX6520

## 2015-02-11 HISTORY — PX: BREAST LUMPECTOMY: SHX2

## 2015-02-11 HISTORY — PX: PORT-A-CATH REMOVAL: SHX5289

## 2015-02-11 LAB — GLUCOSE, CAPILLARY
Glucose-Capillary: 116 mg/dL — ABNORMAL HIGH (ref 65–99)
Glucose-Capillary: 135 mg/dL — ABNORMAL HIGH (ref 65–99)

## 2015-02-11 SURGERY — RADIOACTIVE SEED GUIDED PARTIAL MASTECTOMY WITH AXILLARY SENTINEL LYMPH NODE BIOPSY
Anesthesia: Regional | Site: Chest | Laterality: Right

## 2015-02-11 MED ORDER — BUPIVACAINE-EPINEPHRINE (PF) 0.25% -1:200000 IJ SOLN
INTRAMUSCULAR | Status: AC
  Filled 2015-02-11: qty 30

## 2015-02-11 MED ORDER — BUPIVACAINE-EPINEPHRINE 0.25% -1:200000 IJ SOLN
INTRAMUSCULAR | Status: DC | PRN
  Administered 2015-02-11: 16 mL

## 2015-02-11 MED ORDER — EPHEDRINE SULFATE 50 MG/ML IJ SOLN
INTRAMUSCULAR | Status: AC
  Filled 2015-02-11: qty 2

## 2015-02-11 MED ORDER — LACTATED RINGERS IV SOLN
INTRAVENOUS | Status: DC | PRN
  Administered 2015-02-11 (×2): via INTRAVENOUS

## 2015-02-11 MED ORDER — FENTANYL CITRATE (PF) 100 MCG/2ML IJ SOLN
INTRAMUSCULAR | Status: AC
  Administered 2015-02-11: 25 ug via INTRAVENOUS
  Filled 2015-02-11: qty 2

## 2015-02-11 MED ORDER — PROMETHAZINE HCL 25 MG/ML IJ SOLN
6.2500 mg | INTRAMUSCULAR | Status: DC | PRN
  Administered 2015-02-11: 6.25 mg via INTRAVENOUS

## 2015-02-11 MED ORDER — ONDANSETRON HCL 4 MG/2ML IJ SOLN
INTRAMUSCULAR | Status: AC
  Filled 2015-02-11: qty 4

## 2015-02-11 MED ORDER — PROPOFOL 10 MG/ML IV BOLUS
INTRAVENOUS | Status: AC
  Filled 2015-02-11: qty 20

## 2015-02-11 MED ORDER — LIDOCAINE HCL (PF) 1 % IJ SOLN
INTRAMUSCULAR | Status: AC
  Filled 2015-02-11: qty 30

## 2015-02-11 MED ORDER — FENTANYL CITRATE (PF) 250 MCG/5ML IJ SOLN
INTRAMUSCULAR | Status: DC | PRN
  Administered 2015-02-11 (×2): 50 ug via INTRAVENOUS
  Administered 2015-02-11: 100 ug via INTRAVENOUS
  Administered 2015-02-11: 50 ug via INTRAVENOUS

## 2015-02-11 MED ORDER — 0.9 % SODIUM CHLORIDE (POUR BTL) OPTIME
TOPICAL | Status: DC | PRN
  Administered 2015-02-11: 1000 mL

## 2015-02-11 MED ORDER — FENTANYL CITRATE (PF) 100 MCG/2ML IJ SOLN
25.0000 ug | INTRAMUSCULAR | Status: DC | PRN
  Administered 2015-02-11 (×2): 25 ug via INTRAVENOUS

## 2015-02-11 MED ORDER — ONDANSETRON HCL 4 MG/2ML IJ SOLN
INTRAMUSCULAR | Status: DC | PRN
  Administered 2015-02-11: 4 mg via INTRAVENOUS

## 2015-02-11 MED ORDER — BUPIVACAINE-EPINEPHRINE (PF) 0.5% -1:200000 IJ SOLN
INTRAMUSCULAR | Status: DC | PRN
  Administered 2015-02-11: 30 mL via PERINEURAL

## 2015-02-11 MED ORDER — FENTANYL CITRATE (PF) 250 MCG/5ML IJ SOLN
INTRAMUSCULAR | Status: AC
  Filled 2015-02-11: qty 5

## 2015-02-11 MED ORDER — PHENYLEPHRINE HCL 10 MG/ML IJ SOLN
INTRAMUSCULAR | Status: DC | PRN
  Administered 2015-02-11: 40 ug via INTRAVENOUS

## 2015-02-11 MED ORDER — SODIUM CHLORIDE 0.9 % IJ SOLN
INTRAMUSCULAR | Status: AC
  Filled 2015-02-11: qty 10

## 2015-02-11 MED ORDER — SODIUM CHLORIDE 0.9 % IJ SOLN
INTRAMUSCULAR | Status: DC | PRN
  Administered 2015-02-11: 5 mL via INTRAMUSCULAR

## 2015-02-11 MED ORDER — HYDROCODONE-ACETAMINOPHEN 5-325 MG PO TABS
ORAL_TABLET | ORAL | Status: AC
  Administered 2015-02-11: 2 via ORAL
  Filled 2015-02-11: qty 2

## 2015-02-11 MED ORDER — MIDAZOLAM HCL 2 MG/2ML IJ SOLN
INTRAMUSCULAR | Status: AC
  Filled 2015-02-11: qty 4

## 2015-02-11 MED ORDER — MIDAZOLAM HCL 5 MG/5ML IJ SOLN
INTRAMUSCULAR | Status: DC | PRN
  Administered 2015-02-11: 2 mg via INTRAVENOUS

## 2015-02-11 MED ORDER — HYDROCODONE-ACETAMINOPHEN 5-325 MG PO TABS: 1.0000 | ORAL_TABLET | ORAL | Status: DC | PRN

## 2015-02-11 MED ORDER — METHYLENE BLUE 1 % INJ SOLN
INTRAMUSCULAR | Status: AC
  Filled 2015-02-11: qty 10

## 2015-02-11 MED ORDER — PROMETHAZINE HCL 25 MG/ML IJ SOLN
INTRAMUSCULAR | Status: AC
  Administered 2015-02-11: 6.25 mg via INTRAVENOUS
  Filled 2015-02-11: qty 1

## 2015-02-11 MED ORDER — HYDROCODONE-ACETAMINOPHEN 5-325 MG PO TABS
1.0000 | ORAL_TABLET | Freq: Once | ORAL | Status: AC
  Administered 2015-02-11: 2 via ORAL

## 2015-02-11 MED ORDER — LIDOCAINE HCL (CARDIAC) 20 MG/ML IV SOLN
INTRAVENOUS | Status: DC | PRN
  Administered 2015-02-11: 80 mg via INTRAVENOUS

## 2015-02-11 MED ORDER — PHENYLEPHRINE 40 MCG/ML (10ML) SYRINGE FOR IV PUSH (FOR BLOOD PRESSURE SUPPORT)
PREFILLED_SYRINGE | INTRAVENOUS | Status: AC
  Filled 2015-02-11: qty 10

## 2015-02-11 MED ORDER — SODIUM CHLORIDE 0.9 % IJ SOLN
INTRAMUSCULAR | Status: AC
  Filled 2015-02-11: qty 20

## 2015-02-11 MED ORDER — LIDOCAINE HCL (CARDIAC) 20 MG/ML IV SOLN
INTRAVENOUS | Status: AC
  Filled 2015-02-11: qty 15

## 2015-02-11 MED ORDER — EPHEDRINE SULFATE 50 MG/ML IJ SOLN
INTRAMUSCULAR | Status: DC | PRN
  Administered 2015-02-11: 20 mg via INTRAVENOUS
  Administered 2015-02-11 (×2): 10 mg via INTRAVENOUS

## 2015-02-11 MED ORDER — TECHNETIUM TC 99M SULFUR COLLOID FILTERED
1.0000 | Freq: Once | INTRAVENOUS | Status: AC | PRN
  Administered 2015-02-11: 1 via INTRADERMAL

## 2015-02-11 MED ORDER — ROCURONIUM BROMIDE 50 MG/5ML IV SOLN
INTRAVENOUS | Status: AC
  Filled 2015-02-11: qty 1

## 2015-02-11 MED ORDER — PROPOFOL 10 MG/ML IV BOLUS
INTRAVENOUS | Status: DC | PRN
  Administered 2015-02-11: 150 mg via INTRAVENOUS

## 2015-02-11 SURGICAL SUPPLY — 63 items
APPLIER CLIP 9.375 MED OPEN (MISCELLANEOUS) ×4
APR CLP MED 9.3 20 MLT OPN (MISCELLANEOUS) ×1
BINDER BREAST LRG (GAUZE/BANDAGES/DRESSINGS) IMPLANT
BINDER BREAST XLRG (GAUZE/BANDAGES/DRESSINGS) IMPLANT
BLADE SURG 15 STRL LF DISP TIS (BLADE) ×2 IMPLANT
BLADE SURG 15 STRL SS (BLADE) ×4
CANISTER SUCTION 2500CC (MISCELLANEOUS) IMPLANT
CHLORAPREP W/TINT 10.5 ML (MISCELLANEOUS) ×4 IMPLANT
CHLORAPREP W/TINT 26ML (MISCELLANEOUS) ×4 IMPLANT
CLIP APPLIE 9.375 MED OPEN (MISCELLANEOUS) ×2 IMPLANT
CONT SPEC 4OZ CLIKSEAL STRL BL (MISCELLANEOUS) ×12 IMPLANT
COVER PROBE W GEL 5X96 (DRAPES) ×4 IMPLANT
COVER SURGICAL LIGHT HANDLE (MISCELLANEOUS) ×4 IMPLANT
DEVICE DUBIN SPECIMEN MAMMOGRA (MISCELLANEOUS) ×4 IMPLANT
DRAPE CHEST BREAST 15X10 FENES (DRAPES) ×4 IMPLANT
DRAPE PED LAPAROTOMY (DRAPES) ×4 IMPLANT
DRAPE UTILITY W/TAPE 26X15 (DRAPES) ×4 IMPLANT
DRAPE UTILITY XL STRL (DRAPES) ×4 IMPLANT
ELECT CAUTERY BLADE 6.4 (BLADE) ×4 IMPLANT
ELECT COATED BLADE 2.86 ST (ELECTRODE) ×4 IMPLANT
ELECT REM PT RETURN 9FT ADLT (ELECTROSURGICAL) ×4
ELECTRODE REM PT RTRN 9FT ADLT (ELECTROSURGICAL) ×2 IMPLANT
GAUZE SPONGE 4X4 16PLY XRAY LF (GAUZE/BANDAGES/DRESSINGS) ×4 IMPLANT
GLOVE BIOGEL PI IND STRL 7.0 (GLOVE) ×2 IMPLANT
GLOVE BIOGEL PI IND STRL 8 (GLOVE) ×2 IMPLANT
GLOVE BIOGEL PI INDICATOR 7.0 (GLOVE) ×2
GLOVE BIOGEL PI INDICATOR 8 (GLOVE) ×2
GLOVE ECLIPSE 7.5 STRL STRAW (GLOVE) ×4 IMPLANT
GLOVE SURG SS PI 7.0 STRL IVOR (GLOVE) ×4 IMPLANT
GLOVE SURG SS PI 7.5 STRL IVOR (GLOVE) ×4 IMPLANT
GOWN STRL REUS W/ TWL LRG LVL3 (GOWN DISPOSABLE) ×2 IMPLANT
GOWN STRL REUS W/ TWL XL LVL3 (GOWN DISPOSABLE) ×2 IMPLANT
GOWN STRL REUS W/TWL LRG LVL3 (GOWN DISPOSABLE) ×4
GOWN STRL REUS W/TWL XL LVL3 (GOWN DISPOSABLE) ×4
KIT BASIN OR (CUSTOM PROCEDURE TRAY) ×4 IMPLANT
KIT MARKER MARGIN INK (KITS) ×4 IMPLANT
KIT ROOM TURNOVER OR (KITS) ×4 IMPLANT
LIQUID BAND (GAUZE/BANDAGES/DRESSINGS) ×4 IMPLANT
NDL SAFETY ECLIPSE 18X1.5 (NEEDLE) ×2 IMPLANT
NEEDLE HYPO 18GX1.5 SHARP (NEEDLE) ×3
NEEDLE HYPO 25GX1X1/2 BEV (NEEDLE) ×4 IMPLANT
NEEDLE HYPO 25X1 1.5 SAFETY (NEEDLE) ×8 IMPLANT
NS IRRIG 1000ML POUR BTL (IV SOLUTION) ×4 IMPLANT
PACK SURGICAL SETUP 50X90 (CUSTOM PROCEDURE TRAY) ×4 IMPLANT
PAD ARMBOARD 7.5X6 YLW CONV (MISCELLANEOUS) ×4 IMPLANT
PENCIL BUTTON HOLSTER BLD 10FT (ELECTRODE) ×4 IMPLANT
SPONGE LAP 18X18 X RAY DECT (DISPOSABLE) ×4 IMPLANT
SUT MNCRL AB 4-0 PS2 18 (SUTURE) ×4 IMPLANT
SUT MON AB 4-0 PC3 18 (SUTURE) ×4 IMPLANT
SUT MON AB 5-0 PS2 18 (SUTURE) ×4 IMPLANT
SUT SILK 2 0 SH (SUTURE) IMPLANT
SUT VIC AB 2-0 SH 27 (SUTURE) ×4
SUT VIC AB 2-0 SH 27XBRD (SUTURE) ×2 IMPLANT
SUT VIC AB 3-0 SH 27 (SUTURE) ×7
SUT VIC AB 3-0 SH 27X BRD (SUTURE) ×2 IMPLANT
SUT VIC AB 3-0 SH 27XBRD (SUTURE) ×2 IMPLANT
SYR BULB 3OZ (MISCELLANEOUS) ×4 IMPLANT
SYR CONTROL 10ML LL (SYRINGE) ×4 IMPLANT
TOWEL OR 17X24 6PK STRL BLUE (TOWEL DISPOSABLE) ×4 IMPLANT
TOWEL OR 17X26 10 PK STRL BLUE (TOWEL DISPOSABLE) ×4 IMPLANT
TUBE CONNECTING 12'X1/4 (SUCTIONS) ×1
TUBE CONNECTING 12X1/4 (SUCTIONS) ×3 IMPLANT
YANKAUER SUCT BULB TIP NO VENT (SUCTIONS) ×4 IMPLANT

## 2015-02-11 NOTE — Op Note (Signed)
Preoperative Diagnosis: cancer right breast  Postoprative Diagnosis: cancer right breast  Procedure: Procedure(s): BLUE DYE INJECTION RIGHT BREAST, RIGHT RADIOACTIVE SEED LOCALIZATION LUMPECTOMY  WITH  RIGHT AXILLARY SENTINEL LYMPH NODE BIOPSY REMOVAL PORT-A-CATH   Surgeon: [name] T   Assistants: None  Anesthesia:  General LMA anesthesia  Indications: Patient is a 64 year old female who presented with a stage IIA, T2 N0 invasive ductal carcinoma of the right breast with tumor as large as 5 cm on initial MRI. She has undergone neoadjuvant chemotherapy with good response in terms of tumor size on imaging. After extensive preoperative discussion regarding indications and risks we are proceeding as planned with radioactive seed localized right breast lumpectomy, right axillary sentinel lymph node biopsy and removal of Port-A-Cath.    Procedure Detail:  Patient was seen in the holding area in the seed placement confirmed with the neoprobe. She had undergone injection of 1 mCi of technetium sulfur colloid intradermally around the right nipple preoperatively. She underwent a pectoral block by anesthesia in the holding area. A radioactive seed had been previously placed near the biopsy clip in the right breast. Patient was taken to the operating room, placed in supine position on operating table, laryngeal mask general anesthesia induced. The entire anterior chest breast and axilla were widely sterilely prepped and draped. After patient timeout occurred procedures verified and injected 5 mL of dilute methylene blue subcutaneously beneath the right nipple and massaged this for several minutes. The lobectomy was approached initially. The seed was localized with the neoprobe in the upper outer quadrant of the right breast. A curvilinear incision was made in the skin crease and dissection carried down through the subcutaneous tissue toward the breast capsule. Using the neoprobe for guidance a  generous specimen of breast tissue measuring about 5 cm in diameter was excised with cautery surrounding the seed. The specimen was inked for margins. Specimen mammogram was obtained showing the seed centrally located in the specimen but the marking clip was somewhat more toward the superior and lateral margins of the lumpectomy specimen. Further superior and lateral margins were completely excised back an additional at least 0.5 cm and these new margins were inked and sent for permanent pathology as well. Hemostasis was obtained in the lumpectomy cavity and the cavity was marked with clips. The breast a separate Heaney stitch was closed with interrupted 3-0 Vicryl. Attention was turned to the sentinel lymph node. A hot area in the right axilla was localized and a small transverse incision made. Dissection was carried down through the saphenous tissue using cautery and the clavipectoral fascia incised. Using the neoprobe for guidance I bluntly dissected down onto a small bright blue lymph node with very high counts. This was excised with cautery. Ex vivo the node had counts of 2300. Extensively examining the remainder of the axilla with the neoprobe counts were exactly 0. I could not feel any adenopathy on careful palpation nor identify any further blue dye. This node was sent as a single hot blue right axillary sentinel lymph node. The deep subcutaneous tissue and this wound was closed with interrupted 3-0 Vicryl. Skin was infiltrated with Marcaine in both incisions which were then closed with running subcutaneous 5-0 Monocryl and Dermabond. Attention was turned to the Port-A-Cath removal. The previous scar was elliptically excised along the left anterior chest and dissection carried sharply down onto the port. The hub was dissected free and the catheter withdrawn intact without difficulty. The 2 Prolene sutures were excised and the port hub and  catheter removed intact. The subcutaneous tissue was closed with  interrupted 3-0 Vicryl and skin with subcuticular 5-0 Monocryl and Dermabond. Sponge needle and estimate counts were correct.    Findings: As above  Estimated Blood Loss:  Minimal         Drains: none  Blood Given: none          Specimens: #1 right breast lumpectomy    #2 further superior margin    #3 further lateral margin   #4 hot blue right axillary sentinel lymph node        Complications:  * No complications entered in OR log *         Disposition: PACU - hemodynamically stable.         Condition: stable

## 2015-02-11 NOTE — Anesthesia Postprocedure Evaluation (Signed)
  Anesthesia Post-op Note  Patient: Darlene Kelly  Procedure(s) Performed: Procedure(s) (LRB): RIGHT RADIOACTIVE SEED LOCALIZATION LUMPECTOMY  WITH  RIGHT AXILLARY SENTINEL LYMPH NODE BIOPSY (Right) REMOVAL PORT-A-CATH (Left)  Patient Location: PACU  Anesthesia Type: General  Level of Consciousness: awake and alert   Airway and Oxygen Therapy: Patient Spontanous Breathing  Post-op Pain: mild  Post-op Assessment: Post-op Vital signs reviewed, Patient's Cardiovascular Status Stable, Respiratory Function Stable, Patent Airway and No signs of Nausea or vomiting  Last Vitals:  Filed Vitals:   02/11/15 0945  BP: 138/64  Pulse: 78  Temp:   Resp: 15    Post-op Vital Signs: stable   Complications: No apparent anesthesia complications

## 2015-02-11 NOTE — Interval H&P Note (Signed)
History and Physical Interval Note:  02/11/2015 7:16 AM  Darlene Kelly  has presented today for surgery, with the diagnosis of cancer right breast  The various methods of treatment have been discussed with the patient and family. After consideration of risks, benefits and other options for treatment, the patient has consented to  Procedure(s): RIGHT RADIOACTIVE SEED LOCALIZATION LUMPECTOMY  WITH  RIGHT AXILLARY SENTINEL LYMPH NODE BIOPSY (Right) REMOVAL PORT-A-CATH (N/A) as a surgical intervention .  The patient's history has been reviewed, patient examined, no change in status, stable for surgery.  I have reviewed the patient's chart and labs.  Questions were answered to the patient's satisfaction.     Dusty Raczkowski T

## 2015-02-11 NOTE — Anesthesia Procedure Notes (Addendum)
Anesthesia Regional Block:  Pectoralis block  Pre-Anesthetic Checklist: ,, timeout performed, Correct Patient, Correct Site, Correct Laterality, Correct Procedure, Correct Position, site marked, Risks and benefits discussed,  Surgical consent,  Pre-op evaluation,  At surgeon's request and post-op pain management  Laterality: Right  Prep: chloraprep       Needles:  Injection technique: Single-shot  Needle Type: Echogenic Stimulator Needle     Needle Length: 4cm 4 cm Needle Gauge: 21 and 21 G    Additional Needles:  Procedures: ultrasound guided (picture in chart) Pectoralis block Narrative:  Injection made incrementally with aspirations every 5 mL.  Performed by: Personally  Anesthesiologist: JUDD, BENJAMIN  Additional Notes: Risks, benefits and alternative to block explained extensively.  Patient tolerated procedure well, without complications.   Procedure Name: LMA Insertion Date/Time: 02/11/2015 7:34 AM Performed by: Mariea Clonts Pre-anesthesia Checklist: Patient identified, Timeout performed, Emergency Drugs available, Suction available and Patient being monitored Patient Re-evaluated:Patient Re-evaluated prior to inductionOxygen Delivery Method: Circle system utilized and Ambu bag Preoxygenation: Pre-oxygenation with 100% oxygen Intubation Type: IV induction LMA: LMA inserted LMA Size: 4.0 Number of attempts: 1 Placement Confirmation: breath sounds checked- equal and bilateral and positive ETCO2 Tube secured with: Tape Dental Injury: Teeth and Oropharynx as per pre-operative assessment

## 2015-02-11 NOTE — Transfer of Care (Signed)
Immediate Anesthesia Transfer of Care Note  Patient: Darlene Kelly  Procedure(s) Performed: Procedure(s): RIGHT RADIOACTIVE SEED LOCALIZATION LUMPECTOMY  WITH  RIGHT AXILLARY SENTINEL LYMPH NODE BIOPSY (Right) REMOVAL PORT-A-CATH (Left)  Patient Location: PACU  Anesthesia Type:General  Level of Consciousness: awake, alert  and oriented  Airway & Oxygen Therapy: Patient Spontanous Breathing and Patient connected to nasal cannula oxygen  Post-op Assessment: Report given to RN and Post -op Vital signs reviewed and stable  Post vital signs: Reviewed and stable  Last Vitals:  Filed Vitals:   02/11/15 0910  BP: 114/69  Pulse:   Temp: 36.4 C  Resp: 19    Complications: No apparent anesthesia complications

## 2015-02-11 NOTE — Anesthesia Preprocedure Evaluation (Signed)
Anesthesia Evaluation  Patient identified by MRN, date of birth, ID band Patient awake    Reviewed: Allergy & Precautions, NPO status , Patient's Chart, lab work & pertinent test results  Airway Mallampati: I  TM Distance: >3 FB Neck ROM: Full    Dental   Pulmonary former smoker,    Pulmonary exam normal        Cardiovascular hypertension, Pt. on medications Normal cardiovascular exam+ Valvular Problems/Murmurs AS and MR   Pt had ECHO 08/2013 - Rheumatic Fever as a child Mild AS and MR Good LV function   Neuro/Psych    GI/Hepatic   Endo/Other  diabetes, Type 2, Oral Hypoglycemic Agents  Renal/GU      Musculoskeletal   Abdominal   Peds  Hematology   Anesthesia Other Findings   Reproductive/Obstetrics                             Anesthesia Physical  Anesthesia Plan  ASA: II  Anesthesia Plan: General and Regional   Post-op Pain Management:    Induction: Intravenous  Airway Management Planned: LMA  Additional Equipment:   Intra-op Plan:   Post-operative Plan: Extubation in OR  Informed Consent: I have reviewed the patients History and Physical, chart, labs and discussed the procedure including the risks, benefits and alternatives for the proposed anesthesia with the patient or authorized representative who has indicated his/her understanding and acceptance.     Plan Discussed with: CRNA and Surgeon  Anesthesia Plan Comments: (PECS block + LMA GA)        Anesthesia Quick Evaluation

## 2015-02-11 NOTE — Discharge Instructions (Signed)
[facility] °Office Phone Number [phone] ° °BREAST BIOPSY/ PARTIAL MASTECTOMY: POST OP INSTRUCTIONS ° °Always review your discharge instruction sheet given to you by the facility where your surgery was performed. ° °IF YOU HAVE DISABILITY OR FAMILY LEAVE FORMS, YOU MUST BRING THEM TO THE OFFICE FOR PROCESSING.  DO NOT GIVE THEM TO YOUR DOCTOR. ° °1. A prescription for pain medication may be given to you upon discharge.  Take your pain medication as prescribed, if needed.  If narcotic pain medicine is not needed, then you may take acetaminophen (Tylenol) or ibuprofen (Advil) as needed. °2. Take your usually prescribed medications unless otherwise directed °3. If you need a refill on your pain medication, please contact your pharmacy.  They will contact our office to request authorization.  Prescriptions will not be filled after 5pm or on week-ends. °4. You should eat very light the first 24 hours after surgery, such as soup, crackers, pudding, etc.  Resume your normal diet the day after surgery. °5. Most patients will experience some swelling and bruising in the breast.  Ice packs and a good support bra will help.  Swelling and bruising can take several days to resolve.  °6. It is common to experience some constipation if taking pain medication after surgery.  Increasing fluid intake and taking a stool softener will usually help or prevent this problem from occurring.  A mild laxative (Milk of Magnesia or Miralax) should be taken according to package directions if there are no bowel movements after 48 hours. °7. Unless discharge instructions indicate otherwise, you may remove your bandages 24-48 hours after surgery, and you may shower at that time.  You may have steri-strips (small skin tapes) in place directly over the incision.  These strips should be left on the skin for 7-10 days.  If your surgeon used skin glue on the incision, you may shower in 24 hours.  The glue will flake off over the  next 2-3 weeks.  Any sutures or staples will be removed at the office during your follow-up visit. °8. ACTIVITIES:  You may resume regular daily activities (gradually increasing) beginning the next day.  Wearing a good support bra or sports bra minimizes pain and swelling.  You may have sexual intercourse when it is comfortable. °a. You may drive when you no longer are taking prescription pain medication, you can comfortably wear a seatbelt, and you can safely maneuver your car and apply brakes. °b. RETURN TO WORK:  ______________________________________________________________________________________ °9. You should see your doctor in the office for a follow-up appointment approximately two weeks after your surgery.  Your doctor’s nurse will typically make your follow-up appointment when she calls you with your pathology report.  Expect your pathology report 2-3 business days after your surgery.  You may call to check if you do not hear from us after three days. °10. OTHER INSTRUCTIONS: _______________________________________________________________________________________________ _____________________________________________________________________________________________________________________________________ °_____________________________________________________________________________________________________________________________________ °_____________________________________________________________________________________________________________________________________ ° °WHEN TO CALL YOUR DOCTOR: °1. Fever over 101.0 °2. Nausea and/or vomiting. °3. Extreme swelling or bruising. °4. Continued bleeding from incision. °5. Increased pain, redness, or drainage from the incision. ° °The clinic staff is available to answer your questions during regular business hours.  Please don’t hesitate to call and ask to speak to one of the nurses for clinical concerns.  If you have a medical emergency, go to the nearest  emergency room or call 911.  A surgeon from [facility] is always on call at the hospital. ° °For further questions, please visit [email or web address]  °

## 2015-02-14 ENCOUNTER — Encounter (HOSPITAL_COMMUNITY): Payer: Self-pay | Admitting: General Surgery

## 2015-02-22 ENCOUNTER — Ambulatory Visit (HOSPITAL_BASED_OUTPATIENT_CLINIC_OR_DEPARTMENT_OTHER): Payer: BLUE CROSS/BLUE SHIELD | Admitting: Oncology

## 2015-02-22 VITALS — BP 134/77 | HR 60 | Temp 98.3°F | Resp 18 | Ht 61.0 in | Wt 156.3 lb

## 2015-02-22 DIAGNOSIS — C50411 Malignant neoplasm of upper-outer quadrant of right female breast: Secondary | ICD-10-CM | POA: Diagnosis not present

## 2015-02-22 NOTE — Progress Notes (Signed)
[place]  [phone] Fax:([phone]) [phone]     ID: Darlene Kelly DOB: October 06, 1950  MR#: [id number]  CHY#:[id number]  Patient Care Team: [name], MD as PCP - General (Family Medicine) [name], MD as Consulting Physician (General Surgery) [name], MD as Consulting Physician (Oncology) [name], MD as Consulting Physician (Radiation Oncology) [name], RN as Registered Nurse [name], RN as Registered Nurse [name], MD as Referring Physician (Cardiology) [name], MD as Referring Physician (Dermatology) [name], NP as Nurse Practitioner (Nurse Practitioner) PCP: [name], MD OTHER MD:  CHIEF COMPLAINT: Estrogen receptor positive breast cancer  CURRENT TREATMENT: Adjuvant radiation pending  BREAST CANCER HISTORY: From the original intake note:  Shulamit first noted a change in her right breast September 2015. At that time however she was consumed by the care of her mother-in-law, who had severe Alzheimer's disease. She finally passed in January 2016, but Peighton did not share the information regarding the right breast with her husband until late March 2016. At that point she was set up for bilateral diagnostic mammography with tomography at the breast Center, for 11/20/2014. This found the breast density to be category B. In the upper outer quadrant of the right breast there was a 4 cm irregular spiculated mass. There were no other findings of concern. The mass was palpable and firm, and by ultrasound measured 3.3 cm. The right axilla was negative sonographically.  Biopsy of the right breast mass in question was obtained for 03/22/2015. 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  On 07/20/2014 the patient underwent bilateral  breast MRI. This measured the large irregular enhancing mass in the right breast at the 10:00 position at 5.0 cm. There were no additional worrisome findings in the right breast, and no findings in the left breast or any abnormal appearing lymph nodes.  The patient's subsequent history is as detailed below  INTERVAL HISTORY: Anastasha returns today for follow-up of her estrogen receptor positive breast cancer. Since her last visit here she underwent right lumpectomy and sentinel lymph node sampling, on 02/11/2015. The final pathology ([id number], found no residual invasive disease in the breast and the single sentinel lymph node was clear. 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 Her port was removed at the same time. She did remarkably well with the surgery, with no unusual pain, fever, or bleeding. She is delighted with the cosmetic result.  REVIEW OF SYSTEMS: Geraldy has largely recovered from her chemotherapy side effects and in particular reports no peripheral neuropathy symptoms. She has excellent right upper extremity range of motion postop, and no right upper extremity swelling. Her sense of taste is normal. Hair and eyebrows are ready coming back. She just came back from a week at the beach which she greatly enjoyed. A detailed review of systems today was noncontributory  PAST MEDICAL HISTORY: Past Medical History  Diagnosis Date  . Breast cancer of upper-outer quadrant of right female breast ([place]) 07/16/2014  . Diabetes mellitus without complication ([place])   . Hypertension   . Skin cancer   . Heart murmur     PAST SURGICAL HISTORY: Past Surgical History  Procedure Laterality Date  . Portacath placement N/A 08/03/2014    Procedure: INSERTION PORT-A-CATH;  Surgeon: [name], MD;  Location:  [facility];  Service: General;  Laterality: N/A;  . Radioactive seed guided mastectomy with axillary sentinel lymph node biopsy Right 02/11/2015     Procedure: RIGHT RADIOACTIVE SEED LOCALIZATION LUMPECTOMY  WITH  RIGHT AXILLARY SENTINEL LYMPH NODE BIOPSY;  Surgeon: [name], MD;  Location: [facility];  Service: General;  Laterality: Right;  . Port-a-cath removal Left 02/11/2015    Procedure: REMOVAL PORT-A-CATH;  Surgeon: [name], MD;  Location: [facility];  Service: General;  Laterality: Left;    FAMILY HISTORY Family History  Problem Relation Age of Onset  . Lung cancer Father    the patient's father died from lung cancer in the setting of tobacco abuse at the age of 63. The patient's mother died from COPD at the age of 87. The patient has 3 brothers, no sisters. There is no history of breast or ovarian cancer in the family.  GYNECOLOGIC HISTORY:  No LMP recorded. Patient is postmenopausal. Menarche age 35, first live birth age 41. She is GX P3. She went through the change of life in her late 12s. She did not take hormone replacement.  SOCIAL HISTORY:  Keidy worked for [employer], but is now retired. Her husband [name] is Programmer, systems for a [employer]. They have been married more than 40 years. Son [name] "[name] "Josh" Marshelle Bilger is a English as a second language teacher in [place]. Son [name] Dimitria Ketchum is a Dealer in [place]. Daughter Bayler Nehring is a branch bank or for [employer] in [place]. The patient has 3 grandchildren.    ADVANCED DIRECTIVES: In place   HEALTH MAINTENANCE: Social History  Substance Use Topics  . Smoking status: Former Smoker -- 0.50 packs/day for 12 years    Types: Cigarettes    Quit date: 04/02/1986  . Smokeless tobacco: Never Used  . Alcohol Use: 1.8 oz/week    3 Glasses of wine per week     Colonoscopy: Never  PAP:  Bone density: Never  Lipid panel:  Allergies  Allergen Reactions  . Latex Itching, Dermatitis and Rash  . Lipitor [Atorvastatin] Other (See Comments)    Bad leg cramps    Current Outpatient Prescriptions  Medication Sig Dispense  Refill  . aspirin EC 81 MG tablet Take 81 mg by mouth at bedtime.     . cetirizine (ZYRTEC) 10 MG tablet Take 10 mg by mouth daily as needed for allergies.    . Cholecalciferol (VITAMIN D) 2000 UNITS CAPS Take 1 capsule by mouth daily.    Marland Kitchen HYDROcodone-acetaminophen (NORCO/VICODIN) 5-325 MG tablet Take 1-2 tablets by mouth every 4 (four) hours as needed for moderate pain or severe pain. 40 tablet 0  . ibuprofen (ADVIL,MOTRIN) 200 MG tablet Take 400 mg by mouth every 6 (six) hours as needed for headache or moderate pain.    Marland Kitchen lisinopril (PRINIVIL,ZESTRIL) 10 MG tablet Take 10 mg by mouth at bedtime.   6  . MELATONIN PO Take 10 mg by mouth at bedtime.     . metFORMIN (GLUCOPHAGE) 1000 MG tablet Take 1,000 mg by mouth 2 (two) times daily with a meal.    . metoprolol succinate (TOPROL-XL) 100 MG 24 hr tablet Take 100 mg by mouth every evening.     . Omega-3 Fatty Acids (FISH OIL PO) Take 1 capsule by mouth every evening. MEGA RED 500 MG    . rosuvastatin (CRESTOR) 40 MG tablet Take 20 mg by mouth 3 (three) times a week. Pt takes 1/2 tablet(13m) on Mondays, Wednesdays, Fridays.  No current facility-administered medications for this visit.    OBJECTIVE: Middle-aged white woman who appears stated age 63 Vitals:   02/22/15 0820  BP: 134/77  Pulse: 60  Temp: 98.3 F (36.8 C)  Resp: 18     Body mass index is 29.55 kg/(m^2).    ECOG FS:0 - Asymptomatic  Sclerae unicteric, pupils round and equal Oropharynx clear and moist-- no thrush or other lesions No cervical or supraclavicular adenopathy Lungs no rales or rhonchi Heart regular rate and rhythm Abd soft, nontender, positive bowel sounds MSK no focal spinal tenderness, no upper extremity lymphedema Neuro: nonfocal, well oriented, appropriate affect Breasts: The right breast is status post lumpectomy and sentinel lymph node sampling. The incisions are healing nicely, with no dehiscence, erythema, or swelling. There is a minimal blush on  the anterior aspect of the breast which is common postop. The cosmetic result is excellent. The right axilla is benign. The left breast is unremarkable.     LAB RESULTS:  CMP     Component Value Date/Time   NA 138 02/03/2015 0944   NA 141 01/04/2015 0929   K 4.6 02/03/2015 0944   K 4.5 01/04/2015 0929   CL 103 02/03/2015 0944   CO2 25 02/03/2015 0944   CO2 24 01/04/2015 0929   GLUCOSE 124* 02/03/2015 0944   GLUCOSE 123 01/04/2015 0929   BUN 12 02/03/2015 0944   BUN 10.5 01/04/2015 0929   CREATININE 0.63 02/03/2015 0944   CREATININE 0.7 01/04/2015 0929   CALCIUM 10.1 02/03/2015 0944   CALCIUM 9.6 01/04/2015 0929   PROT 6.4 01/04/2015 0929   ALBUMIN 3.9 01/04/2015 0929   AST 16 01/04/2015 0929   ALT 14 01/04/2015 0929   ALKPHOS 57 01/04/2015 0929   BILITOT 0.64 01/04/2015 0929   GFRNONAA >60 02/03/2015 0944   GFRAA >60 02/03/2015 0944    INo results found for: SPEP, UPEP  Lab Results  Component Value Date   WBC 7.6 02/03/2015   NEUTROABS 2.7 01/11/2015   HGB 13.7 02/03/2015   HCT 39.5 02/03/2015   MCV 88.0 02/03/2015   PLT 233 02/03/2015      Chemistry      Component Value Date/Time   NA 138 02/03/2015 0944   NA 141 01/04/2015 0929   K 4.6 02/03/2015 0944   K 4.5 01/04/2015 0929   CL 103 02/03/2015 0944   CO2 25 02/03/2015 0944   CO2 24 01/04/2015 0929   BUN 12 02/03/2015 0944   BUN 10.5 01/04/2015 0929   CREATININE 0.63 02/03/2015 0944   CREATININE 0.7 01/04/2015 0929      Component Value Date/Time   CALCIUM 10.1 02/03/2015 0944   CALCIUM 9.6 01/04/2015 0929   ALKPHOS 57 01/04/2015 0929   AST 16 01/04/2015 0929   ALT 14 01/04/2015 0929   BILITOT 0.64 01/04/2015 0929       No results found for: LABCA2  No components found for: [id number]  No results for input(s): INR in the last 168 hours.  Urinalysis No results found for: COLORURINE, APPEARANCEUR, LABSPEC, PHURINE, GLUCOSEU, HGBUR, BILIRUBINUR, KETONESUR, PROTEINUR, UROBILINOGEN, NITRITE,  LEUKOCYTESUR  STUDIES: Dg Chest 2 View  02/03/2015  CLINICAL DATA:  Right upper outer quadrant breast carcinoma. Pre-op respiratory exam EXAM: CHEST  2 VIEW COMPARISON:  08/03/2014 FINDINGS: Left-sided Port-A-Cath position is unchanged with tip overlying the distal left innominate vein. Improved aeration of both lungs is seen. Both lungs are clear. No evidence of pleural effusion or pneumothorax. Small hiatal hernia noted. IMPRESSION:  No active cardiopulmonary disease.  Small hiatal hernia. Unchanged left Port-A-Cath position with tip overlying the distal left innominate vein. Electronically Signed   By: Earle Gell M.D.   On: 02/03/2015 10:12   Nm Sentinel Node Inj-no Rpt (breast)  02/11/2015  CLINICAL DATA: cancer right breast Sulfur colloid was injected intradermally by the nuclear medicine technologist for breast cancer sentinel node localization.   Mm Breast Surgical Specimen  02/11/2015  CLINICAL DATA:  Lumpectomy specimen radiograph from the right breast. EXAM: SPECIMEN RADIOGRAPH OF THE RIGHT BREAST COMPARISON:  Previous exam(s). FINDINGS: Status post excision of the right breast. The radioactive seed and biopsy marker clip are present, completely intact, and were marked for pathology. The mass is transected along the edge of the radiographed specimen. These findings were relayed to the OR at 8:05 a.m. IMPRESSION: Specimen radiograph of the right breast. Electronically Signed   By: Ammie Ferrier M.D.   On: 02/11/2015 08:15   Mm Rt Radioactive Seed Loc Mammo Guide  02/07/2015  CLINICAL DATA:  64 year old female for radioactive seed placement prior to right lumpectomy for breast cancer. EXAM: MAMMOGRAPHIC GUIDED RADIOACTIVE SEED LOCALIZATION OF THE RIGHT BREAST COMPARISON:  Previous exam(s). FINDINGS: Patient presents for radioactive seed localization prior to right lumpectomy. I met with the patient and we discussed the procedure of seed localization including benefits and alternatives. We  discussed the high likelihood of a successful procedure. We discussed the risks of the procedure including infection, bleeding, tissue injury and further surgery. We discussed the low dose of radioactivity involved in the procedure. Informed, written consent was given. The usual time-out protocol was performed immediately prior to the procedure. Using mammographic guidance, sterile technique, 2% lidocaine and an I-125 radioactive seed, the biopsy clip was localized using a superior approach. The follow-up mammogram images demonstrate the seed to be located 1 cm posteroinferior to the biopsy clip and were marked for Dr. [name]. Follow-up survey of the patient confirms presence of the radioactive seed. Order number of I-125 seed:  [id number]. Total activity:  0.254  Reference Date: 12/28/2014 The patient tolerated the procedure well and was released from the [facility]. She was given instructions regarding seed removal. IMPRESSION: Radioactive seed localization right breast. No apparent complications. Electronically Signed   By: Margarette Canada M.D.   On: 02/07/2015 13:34    ASSESSMENT: 64 y.o. [place] woman status post right breast upper outer quadrant biopsy 07/13/2014, for a clinical T3 N0, stage IIB invasive ductal carcinoma, grade 3, estrogen receptor positive, HER-2 and progesterone receptor negative, with an MIB-1 of 40%  (1) neoadjuvant chemotherapy starting 08/15/2013, consisting of doxorubicin and cyclophosphamide in dose dense fashion 4, completed 09/28/2014, followed by weekly paclitaxel 12 starting 10/19/2014, completed 01/04/2015  (2) status post right lumpectomy and sentinel lymph node sampling 02/11/2015 for a ypTis ypN0 ductal carcinoma in situ, with negative margins. This counts as a complete pathologic response  (3) adjuvant radiation to follow surgery  (4) anti-estrogens to follow radiation  PLAN: Bridget did very well with her neoadjuvant chemotherapy, and reports no continuing  problems or complications from those treatments. She has achieved a complete pathologic response, which is very favorable. The predictive value of this does vary by breast cancer subtype and it is probably least prognostic in the estrogen receptor positive group, but the advantage there is that she will continue systemic therapy with anti-estrogens after radiation. That will significantly further reduce her risk of recurrence.  Next step is radiation, and she is already set up for  an appropriate Dr. Pablo Ledger next week. I anticipate she will complete her radiation sometime in January. I am going to see her again in February. At the next visit we will choose her antiestrogen and to help [place] with that decision I am setting her up for a bone density next month. We will also obtain a vitamin D level before the next visit here  Quenna is very interested in volunteering here. She will be able to do that once she completes her local treatment and more fully recovers.  At this point I am delighted at how well she has done. She knows to call for any problems that may develop before her next visit here.  [name], MD   02/22/2015 8:23 AM

## 2015-02-23 ENCOUNTER — Telehealth: Payer: Self-pay | Admitting: Oncology

## 2015-02-23 NOTE — Telephone Encounter (Signed)
Left a message with dexa and follow up

## 2015-03-02 ENCOUNTER — Ambulatory Visit
Admission: RE | Admit: 2015-03-02 | Discharge: 2015-03-02 | Disposition: A | Discharge status: Home / Self Care | Payer: BLUE CROSS/BLUE SHIELD | Source: Ambulatory Visit | Attending: Radiation Oncology | Admitting: Radiation Oncology

## 2015-03-02 ENCOUNTER — Encounter: Payer: Self-pay | Admitting: Radiation Oncology

## 2015-03-02 VITALS — BP 151/77 | HR 82 | Temp 98.9°F | Ht 61.0 in | Wt 157.9 lb

## 2015-03-02 DIAGNOSIS — Z9221 Personal history of antineoplastic chemotherapy: Secondary | ICD-10-CM | POA: Diagnosis not present

## 2015-03-02 DIAGNOSIS — Z510 Encounter for antineoplastic radiation therapy: Secondary | ICD-10-CM | POA: Insufficient documentation

## 2015-03-02 DIAGNOSIS — C50411 Malignant neoplasm of upper-outer quadrant of right female breast: Secondary | ICD-10-CM | POA: Insufficient documentation

## 2015-03-02 DIAGNOSIS — Z9889 Other specified postprocedural states: Secondary | ICD-10-CM | POA: Insufficient documentation

## 2015-03-02 NOTE — Addendum Note (Signed)
Encounter addended by: Benn Moulder, RN on: 03/02/2015  5:57 PM<BR>     Documentation filed: Charges VN

## 2015-03-02 NOTE — Progress Notes (Signed)
Darlene Kelly here today for consult breast cancer.  States she is feeling good.  Appetite is good.  Energy level is good.  BP 151/77 mmHg  Pulse 82  Temp(Src) 98.9 F (37.2 C) (Oral)  Ht 5\' 1"  (1.549 m)  Wt 157 lb 14.4 oz (71.623 kg)  BMI 29.85 kg/m2  SpO2 98%   Wt Readings from Last 3 Encounters:  03/02/15 157 lb 14.4 oz (71.623 kg)  02/22/15 156 lb 4.8 oz (70.897 kg)  02/11/15 154 lb (69.854 kg)

## 2015-03-02 NOTE — Progress Notes (Signed)
   Department of Radiation Oncology  Phone:  [phone] Fax:        [phone]   Name: Darlene Kelly MRN: [id number]  DOB: 09/27/1950  Date: 03/02/2015  Follow Up Visit Note  Diagnosis: Breast cancer of upper-outer quadrant of right female breast [facility])   Staging form: Breast, AJCC 7th Edition     Clinical stage from 07/21/2014: Stage IIA (T2, N0, M0) - Unsigned   Interval History: Darlene Kelly presents today for routine followup.  She underwent neoadjuvant chemotherapy with [facility] and Taxol completed in October 2016. She then underwent a right lumpectomy on November 11th which showed only residual in situ disease. She had negative margins and a single lymph node was negative for metastatic disease. She has healed up well from surgery.   States she is feeling good. Appetite is good. Energy level is good. Her only complaint from chemotherapy is denting in her fingernails. She denies neuropathy. Darlene Kelly states that she is ready to begin radiation. She would like an early morning slot for radiation.    Physical Exam:  Filed Vitals:   03/02/15 1256  BP: 151/77  Pulse: 82  Temp: 98.9 F (37.2 C)  TempSrc: Oral  Height: 5\' 1"  (1.549 m)  Weight: 157 lb 14.4 oz (71.623 kg)  SpO2: 98%   Incision over the right lateral breast with residual sentinel lymph node dye. Incision is healing well with no signs of infection. She is alert and oriented x3.   IMPRESSION: Darlene Kelly is a 64 y.o. 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 We discussed the retrospective data showing an increase in failure rates in patients who have a pathologic complete response and did not undergo radiation. For this reason I have recommended radiation to the whole breast followed by boost to the tumor bed. We discussed the process of simulation the placement  tattoos. We discussed possible side effects during treatment including but not limited to skin irritation darkness and fatigue. We discussed long-term effects of treatment which are extremely unlikely but possible including damage to the lungs and ribs. We discussed the low likelihood of secondary malignancies.   She signed informed consent.   We have set her up for a simulation next Tuesday, 12/6, at 9 am.    [name], MD  This document serves as a record of services personally performed by [name], MD. It was created on her behalf by Arlyce Harman, a trained medical scribe. The creation of this record is based on the scribe's personal observations and the provider's statements to them. This document has been checked and approved by the attending provider.

## 2015-03-08 ENCOUNTER — Ambulatory Visit
Admission: RE | Admit: 2015-03-08 | Discharge: 2015-03-08 | Disposition: A | Discharge status: Home / Self Care | Payer: BLUE CROSS/BLUE SHIELD | Source: Ambulatory Visit | Attending: Radiation Oncology | Admitting: Radiation Oncology

## 2015-03-08 DIAGNOSIS — Z510 Encounter for antineoplastic radiation therapy: Secondary | ICD-10-CM | POA: Diagnosis not present

## 2015-03-08 DIAGNOSIS — C50411 Malignant neoplasm of upper-outer quadrant of right female breast: Secondary | ICD-10-CM

## 2015-03-08 NOTE — Progress Notes (Signed)
Radiation Oncology         [phone] ________________________________  Name: Darlene Kelly      MRN: [id number]          Date: 03/08/2015              DOB: 07-09-1950  Optical Surface Tracking Plan:  Since intensity modulated radiotherapy (IMRT) and 3D conformal radiation treatment methods are predicated on accurate and precise positioning for treatment, intrafraction motion monitoring is medically necessary to ensure accurate and safe treatment delivery.  The ability to quantify intrafraction motion without excessive ionizing radiation dose can only be performed with optical surface tracking. Accordingly, surface imaging offers the opportunity to obtain 3D measurements of patient position throughout IMRT and 3D treatments without excessive radiation exposure.  I am ordering optical surface tracking for this patient's upcoming course of radiotherapy. ________________________________ Signature   Reference:   Ursula Alert, J, et al. Surface imaging-based analysis of intrafraction motion for breast radiotherapy patients.Journal of [place], n. 6, nov. 2014. ISSN [id number].   Available at: <http://www.jacmp.org/index.php/jacmp/article/view/4957>.

## 2015-03-08 NOTE — Progress Notes (Signed)
Name: Darlene Kelly   MRN: [id number]  Date:  03/08/2015  DOB: 07/16/1950  Status:outpatient    DIAGNOSIS: Breast cancer.  CONSENT VERIFIED: yes   SET UP: Patient is setup supine   IMMOBILIZATION:  The following immobilization was used:Custom Moldable Pillow, breast board.   NARRATIVE: [name] was brought to the [place].  Identity was confirmed.  All relevant records and images related to the planned course of therapy were reviewed.  Then, the patient was positioned in a stable reproducible clinical set-up for radiation therapy.  Wires were placed to delineate the clinical extent of breast tissue. A wire was placed on the scar as well.  CT images were obtained.  An isocenter was placed. Skin markings were placed.  The CT images were loaded into the planning software where the target and avoidance structures were contoured.  The radiation prescription was entered and confirmed. The patient was discharged in stable condition and tolerated simulation well.    TREATMENT PLANNING NOTE:  Treatment planning then occurred. I have requested : MLC's, isodose plan, basic dose calculation  I personally designed and supervised the construction of 3 medically necessary complex treatment devices for the protection of critical normal structures including the lungs and contralateral breast as well as the immobilization device which is necessary for set up certainty.   3D simulation occurred. I requested and analyzed a dose volume histogram of the heart, lungs and lumpectomy cavity.

## 2015-03-15 ENCOUNTER — Ambulatory Visit
Admission: RE | Admit: 2015-03-15 | Discharge: 2015-03-15 | Disposition: A | Discharge status: Home / Self Care | Payer: BLUE CROSS/BLUE SHIELD | Source: Ambulatory Visit | Attending: Radiation Oncology | Admitting: Radiation Oncology

## 2015-03-15 DIAGNOSIS — Z510 Encounter for antineoplastic radiation therapy: Secondary | ICD-10-CM | POA: Diagnosis not present

## 2015-03-16 ENCOUNTER — Ambulatory Visit
Admission: RE | Admit: 2015-03-16 | Discharge: 2015-03-16 | Disposition: A | Discharge status: Home / Self Care | Payer: BLUE CROSS/BLUE SHIELD | Source: Ambulatory Visit | Attending: Radiation Oncology | Admitting: Radiation Oncology

## 2015-03-16 DIAGNOSIS — Z510 Encounter for antineoplastic radiation therapy: Secondary | ICD-10-CM | POA: Diagnosis not present

## 2015-03-17 ENCOUNTER — Ambulatory Visit
Admission: RE | Admit: 2015-03-17 | Discharge: 2015-03-17 | Disposition: A | Discharge status: Home / Self Care | Payer: BLUE CROSS/BLUE SHIELD | Source: Ambulatory Visit | Attending: Radiation Oncology | Admitting: Radiation Oncology

## 2015-03-17 DIAGNOSIS — Z510 Encounter for antineoplastic radiation therapy: Secondary | ICD-10-CM | POA: Diagnosis not present

## 2015-03-18 ENCOUNTER — Ambulatory Visit
Admission: RE | Admit: 2015-03-18 | Discharge: 2015-03-18 | Disposition: A | Discharge status: Home / Self Care | Payer: BLUE CROSS/BLUE SHIELD | Source: Ambulatory Visit | Attending: Radiation Oncology | Admitting: Radiation Oncology

## 2015-03-18 DIAGNOSIS — Z510 Encounter for antineoplastic radiation therapy: Secondary | ICD-10-CM | POA: Diagnosis not present

## 2015-03-21 ENCOUNTER — Ambulatory Visit
Admission: RE | Admit: 2015-03-21 | Discharge: 2015-03-21 | Disposition: A | Discharge status: Home / Self Care | Payer: BLUE CROSS/BLUE SHIELD | Source: Ambulatory Visit | Attending: Radiation Oncology | Admitting: Radiation Oncology

## 2015-03-21 DIAGNOSIS — Z510 Encounter for antineoplastic radiation therapy: Secondary | ICD-10-CM | POA: Diagnosis not present

## 2015-03-22 ENCOUNTER — Ambulatory Visit
Admission: RE | Admit: 2015-03-22 | Discharge: 2015-03-22 | Disposition: A | Discharge status: Home / Self Care | Payer: BLUE CROSS/BLUE SHIELD | Source: Ambulatory Visit | Attending: Radiation Oncology | Admitting: Radiation Oncology

## 2015-03-22 ENCOUNTER — Encounter: Payer: Self-pay | Admitting: Radiation Oncology

## 2015-03-22 VITALS — BP 129/66 | HR 69 | Temp 97.6°F | Resp 20 | Wt 157.2 lb

## 2015-03-22 DIAGNOSIS — C50411 Malignant neoplasm of upper-outer quadrant of right female breast: Secondary | ICD-10-CM

## 2015-03-22 DIAGNOSIS — Z510 Encounter for antineoplastic radiation therapy: Secondary | ICD-10-CM | POA: Diagnosis not present

## 2015-03-22 MED ORDER — RADIAPLEXRX EX GEL
Freq: Once | CUTANEOUS | Status: AC
  Administered 2015-03-22: 12:00:00 via TOPICAL

## 2015-03-22 MED ORDER — ALRA NON-METALLIC DEODORANT (RAD-ONC)
1.0000 "application " | Freq: Once | TOPICAL | Status: AC
  Administered 2015-03-22: 1 via TOPICAL

## 2015-03-22 NOTE — Progress Notes (Signed)
  Radiation Oncology         [phone] ________________________________  Name: Darlene Kelly MRN: [id number]  Date: 03/22/2015  DOB: [birth year]  Weekly Radiation Therapy Management    ICD-9-CM ICD-10-CM   1. Breast cancer of upper-outer quadrant of right female breast (HCC) 174.4 [id number] non-metallic deodorant (ALRA) 1 application     hyaluronate sodium (RADIAPLEXRX) gel     Current Dose: 9 Gy     Planned Dose:  61 Gy  Narrative . . . . . . . . The patient presents for routine under treatment assessment.                                   The patient is without complaint.                                 Set-up films were reviewed.                                 The chart was checked. Physical Findings. . .  weight is 157 lb 3.2 oz (71.305 kg). Her oral temperature is 97.6 F (36.4 C). Her blood pressure is 129/66 and her pulse is 69. Her respiration is 20. . The lungs are clear. The heart has a regular rhythm and rate. The right breast area shows no significant radiation reaction at this time. Impression . . . . . . . The patient is tolerating radiation. Plan . . . . . . . . . . . . Continue treatment as planned.  ________________________________   Blair Promise, PhD, MD

## 2015-03-22 NOTE — Progress Notes (Signed)
Weekly rad tx right breast,  5/33 completed, patient education done, no skin changes as yet, gave radiplex gel and alra deodorant, along with Radiatin therapy and you book,discussed ways to manage side effects, pain, fatigue, skin irritation, soreness, swelling of breast, to use radiaplex   after rad tx and bedtime, alra prn, increase protein in your diet, stay hydrated, drink plenty fluids , teach back given, no c/o pain  BP 129/66 mmHg  Pulse 69  Temp(Src) 97.6 F (36.4 C) (Oral)  Resp 20  Wt 157 lb 3.2 oz (71.305 kg)  Wt Readings from Last 3 Encounters:  03/22/15 157 lb 3.2 oz (71.305 kg)  03/02/15 157 lb 14.4 oz (71.623 kg)  02/22/15 156 lb 4.8 oz (70.897 kg)  11:34 AM

## 2015-03-23 ENCOUNTER — Ambulatory Visit
Admission: RE | Admit: 2015-03-23 | Discharge: 2015-03-23 | Disposition: A | Discharge status: Home / Self Care | Payer: BLUE CROSS/BLUE SHIELD | Source: Ambulatory Visit | Attending: Radiation Oncology | Admitting: Radiation Oncology

## 2015-03-23 ENCOUNTER — Telehealth: Payer: Self-pay | Admitting: *Deleted

## 2015-03-23 DIAGNOSIS — Z510 Encounter for antineoplastic radiation therapy: Secondary | ICD-10-CM | POA: Diagnosis not present

## 2015-03-23 NOTE — Telephone Encounter (Signed)
Left message for a return phone call to follow up after start of radiation.   

## 2015-03-24 ENCOUNTER — Ambulatory Visit
Admission: RE | Admit: 2015-03-24 | Discharge: 2015-03-24 | Disposition: A | Discharge status: Home / Self Care | Payer: BLUE CROSS/BLUE SHIELD | Source: Ambulatory Visit | Attending: Radiation Oncology | Admitting: Radiation Oncology

## 2015-03-24 DIAGNOSIS — Z510 Encounter for antineoplastic radiation therapy: Secondary | ICD-10-CM | POA: Diagnosis not present

## 2015-03-25 ENCOUNTER — Ambulatory Visit
Admission: RE | Admit: 2015-03-25 | Discharge: 2015-03-25 | Disposition: A | Discharge status: Home / Self Care | Payer: BLUE CROSS/BLUE SHIELD | Source: Ambulatory Visit | Attending: Radiation Oncology | Admitting: Radiation Oncology

## 2015-03-25 DIAGNOSIS — Z510 Encounter for antineoplastic radiation therapy: Secondary | ICD-10-CM | POA: Diagnosis not present

## 2015-03-29 ENCOUNTER — Ambulatory Visit
Admission: RE | Admit: 2015-03-29 | Discharge: 2015-03-29 | Disposition: A | Discharge status: Home / Self Care | Payer: BLUE CROSS/BLUE SHIELD | Source: Ambulatory Visit | Attending: Radiation Oncology | Admitting: Radiation Oncology

## 2015-03-29 ENCOUNTER — Encounter: Payer: Self-pay | Admitting: Radiation Oncology

## 2015-03-29 VITALS — BP 126/68 | HR 68 | Temp 98.1°F | Ht 61.0 in | Wt 159.0 lb

## 2015-03-29 DIAGNOSIS — C50411 Malignant neoplasm of upper-outer quadrant of right female breast: Secondary | ICD-10-CM

## 2015-03-29 DIAGNOSIS — Z510 Encounter for antineoplastic radiation therapy: Secondary | ICD-10-CM | POA: Diagnosis not present

## 2015-03-29 NOTE — Progress Notes (Signed)
Ms. Bieler is here for her 9th fraction of radiation to her Right Breast. She denies any fatigue at this time. The skin to her Right Breast is normal in appearance. She only complains of some tenderness at her surgery site. She is using the radiaplex cream as directed twice daily.   BP 126/68 mmHg  Pulse 68  Temp(Src) 98.1 F (36.7 C)  Ht 5\' 1"  (1.549 m)  Wt 159 lb (72.122 kg)  BMI 30.06 kg/m2

## 2015-03-29 NOTE — Progress Notes (Signed)
Weekly Management Note Current Dose:  16.2 Gy  Projected Dose:  61 Gy   Narrative:  The patient presents for routine under treatment assessment.  CBCT/MVCT images/Port film x-rays were reviewed.  The chart was checked.  She denies any fatigue at this time. The skin to her right breast is normal in appearance. She only complains of some tenderness at her surgery site. She is using the radiaplex cream as directed twice daily.   Physical Findings: Weight: 159 lb (72.122 kg). Unchanged. No skin changes.  Impression:  The patient is tolerating radiation.  Plan:  Continue treatment as planned.  This document serves as a record of services personally performed by [name], MD. It was created on her behalf by Darcus Austin, a trained medical scribe. The creation of this record is based on the scribe's personal observations and the provider's statements to them. This document has been checked and approved by the attending provider.

## 2015-03-30 ENCOUNTER — Ambulatory Visit
Admission: RE | Admit: 2015-03-30 | Discharge: 2015-03-30 | Disposition: A | Discharge status: Home / Self Care | Payer: BLUE CROSS/BLUE SHIELD | Source: Ambulatory Visit | Attending: Radiation Oncology | Admitting: Radiation Oncology

## 2015-03-30 DIAGNOSIS — Z510 Encounter for antineoplastic radiation therapy: Secondary | ICD-10-CM | POA: Diagnosis not present

## 2015-03-31 ENCOUNTER — Ambulatory Visit
Admission: RE | Admit: 2015-03-31 | Discharge: 2015-03-31 | Disposition: A | Discharge status: Home / Self Care | Payer: BLUE CROSS/BLUE SHIELD | Source: Ambulatory Visit | Attending: Radiation Oncology | Admitting: Radiation Oncology

## 2015-03-31 DIAGNOSIS — Z510 Encounter for antineoplastic radiation therapy: Secondary | ICD-10-CM | POA: Diagnosis not present

## 2015-04-01 ENCOUNTER — Ambulatory Visit
Admission: RE | Admit: 2015-04-01 | Discharge: 2015-04-01 | Disposition: A | Discharge status: Home / Self Care | Payer: BLUE CROSS/BLUE SHIELD | Source: Ambulatory Visit | Attending: Radiation Oncology | Admitting: Radiation Oncology

## 2015-04-01 DIAGNOSIS — Z510 Encounter for antineoplastic radiation therapy: Secondary | ICD-10-CM | POA: Diagnosis not present

## 2015-04-05 ENCOUNTER — Ambulatory Visit
Admission: RE | Admit: 2015-04-05 | Discharge: 2015-04-05 | Disposition: A | Discharge status: Home / Self Care | Payer: BLUE CROSS/BLUE SHIELD | Source: Ambulatory Visit | Attending: Radiation Oncology | Admitting: Radiation Oncology

## 2015-04-05 ENCOUNTER — Ambulatory Visit
Admission: RE | Admit: 2015-04-05 | Discharge: 2015-04-05 | Disposition: A | Discharge status: Home / Self Care | Payer: BLUE CROSS/BLUE SHIELD | Source: Ambulatory Visit | Attending: Oncology | Admitting: Oncology

## 2015-04-05 ENCOUNTER — Encounter: Payer: Self-pay | Admitting: Radiation Oncology

## 2015-04-05 VITALS — BP 145/68 | HR 72 | Temp 98.6°F | Ht 61.0 in | Wt 159.8 lb

## 2015-04-05 DIAGNOSIS — Z510 Encounter for antineoplastic radiation therapy: Secondary | ICD-10-CM | POA: Diagnosis not present

## 2015-04-05 DIAGNOSIS — C50411 Malignant neoplasm of upper-outer quadrant of right female breast: Secondary | ICD-10-CM

## 2015-04-05 NOTE — Progress Notes (Addendum)
Darlene Kelly has received 13 fractions.  Denies any pain today to her right breast.  Appetite is good.  Energy level remains good.  Skin to right breast looks normal pink with healthy tissue, using Radiaplex Bid to right breast.  BP 145/68 mmHg  Pulse 72  Temp(Src) 98.6 F (37 C)  Ht 5\' 1"  (1.549 m)  Wt 159 lb 12.8 oz (72.485 kg)  BMI 30.21 kg/m2  SpO2 99%

## 2015-04-05 NOTE — Progress Notes (Signed)
Weekly Management Note Current Dose: 23.4  Gy  Projected Dose: 61 Gy   Narrative:  The patient presents for routine under treatment assessment.  CBCT/MVCT images/Port film x-rays were reviewed.  The chart was checked. Doing well. No complaints. Fullness beneath scar.   Physical Findings: Weight: 159 lb 12.8 oz (72.485 kg). Unchanged skin. Scar tissue palpable below scar on right breast.   Impression:  The patient is tolerating radiation.  Plan:  Continue treatment as planned. Continue radiaplex.

## 2015-04-06 ENCOUNTER — Ambulatory Visit
Admission: RE | Admit: 2015-04-06 | Discharge: 2015-04-06 | Disposition: A | Discharge status: Home / Self Care | Payer: BLUE CROSS/BLUE SHIELD | Source: Ambulatory Visit | Attending: Radiation Oncology | Admitting: Radiation Oncology

## 2015-04-06 DIAGNOSIS — Z510 Encounter for antineoplastic radiation therapy: Secondary | ICD-10-CM | POA: Diagnosis not present

## 2015-04-07 ENCOUNTER — Ambulatory Visit
Admission: RE | Admit: 2015-04-07 | Discharge: 2015-04-07 | Disposition: A | Discharge status: Home / Self Care | Payer: BLUE CROSS/BLUE SHIELD | Source: Ambulatory Visit | Attending: Radiation Oncology | Admitting: Radiation Oncology

## 2015-04-07 DIAGNOSIS — Z510 Encounter for antineoplastic radiation therapy: Secondary | ICD-10-CM | POA: Diagnosis not present

## 2015-04-08 ENCOUNTER — Ambulatory Visit
Admission: RE | Admit: 2015-04-08 | Discharge: 2015-04-08 | Disposition: A | Discharge status: Home / Self Care | Payer: BLUE CROSS/BLUE SHIELD | Source: Ambulatory Visit | Attending: Radiation Oncology | Admitting: Radiation Oncology

## 2015-04-08 DIAGNOSIS — Z510 Encounter for antineoplastic radiation therapy: Secondary | ICD-10-CM | POA: Diagnosis not present

## 2015-04-11 ENCOUNTER — Ambulatory Visit
Admission: RE | Admit: 2015-04-11 | Discharge: 2015-04-11 | Disposition: A | Discharge status: Home / Self Care | Payer: BLUE CROSS/BLUE SHIELD | Source: Ambulatory Visit | Attending: Radiation Oncology | Admitting: Radiation Oncology

## 2015-04-11 ENCOUNTER — Encounter: Payer: Self-pay | Admitting: Radiation Oncology

## 2015-04-11 VITALS — BP 138/71 | HR 58 | Temp 98.2°F | Resp 10 | Wt 157.8 lb

## 2015-04-11 DIAGNOSIS — C50411 Malignant neoplasm of upper-outer quadrant of right female breast: Secondary | ICD-10-CM | POA: Diagnosis present

## 2015-04-11 DIAGNOSIS — Z510 Encounter for antineoplastic radiation therapy: Secondary | ICD-10-CM | POA: Diagnosis not present

## 2015-04-11 MED ORDER — RADIAPLEXRX EX GEL
Freq: Once | CUTANEOUS | Status: AC
  Administered 2015-04-11: 13:00:00 via TOPICAL

## 2015-04-11 NOTE — Progress Notes (Signed)
PAIN: She is currently in no pain.  SKIN: Pt right breast- positive for erythema and slight dry desquamation to right clavicle.  Pt denies edema.  Pt continues to apply Radiaplex as directed. OTHER: Energy is good. Refill on radiaplex given.  BP 138/71 mmHg  Pulse 58  Temp(Src) 98.2 F (36.8 C) (Oral)  Resp 10  Wt 157 lb 12.8 oz (71.578 kg)  SpO2 100% Wt Readings from Last 3 Encounters:  04/11/15 157 lb 12.8 oz (71.578 kg)  04/05/15 159 lb 12.8 oz (72.485 kg)  03/29/15 159 lb (72.122 kg)

## 2015-04-11 NOTE — Addendum Note (Signed)
Encounter addended by: Jenene Slicker, RN on: 04/11/2015  1:29 PM<BR>     Documentation filed: [employer], Inpatient MAR, Orders

## 2015-04-11 NOTE — Addendum Note (Signed)
Encounter addended by: Jenene Slicker, RN on: 04/11/2015  1:26 PM<BR>     Documentation filed: Medications

## 2015-04-11 NOTE — Progress Notes (Signed)
   Weekly Management Note:outpatient    ICD-9-CM ICD-10-CM   1. Breast cancer of upper-outer quadrant of right female breast ([place]) 174.4 C50.411     Current Dose: 30.6 Gy  Projected Dose: 61 Gy   Narrative:  The patient presents for routine under treatment assessment.  CBCT/MVCT images/Port film x-rays were reviewed.  The chart was checked. Doing well. No complaints, no pain  Physical Findings:  weight is 157 lb 12.8 oz (71.578 kg). Her oral temperature is 98.2 F (36.8 C). Her blood pressure is 138/71 and her pulse is 58. Her respiration is 10 and oxygen saturation is 100%.   Wt Readings from Last 3 Encounters:  04/11/15 157 lb 12.8 oz (71.578 kg)  04/05/15 159 lb 12.8 oz (72.485 kg)  03/29/15 159 lb (72.122 kg)   Mild erythema over right breast  Impression:  The patient is tolerating radiotherapy.  Plan:  Continue radiotherapy as planned.    ________________________________   Eppie Gibson, M.D.

## 2015-04-12 ENCOUNTER — Encounter: Payer: Self-pay | Admitting: Radiation Oncology

## 2015-04-12 ENCOUNTER — Ambulatory Visit
Admission: RE | Admit: 2015-04-12 | Discharge: 2015-04-12 | Disposition: A | Discharge status: Home / Self Care | Payer: BLUE CROSS/BLUE SHIELD | Source: Ambulatory Visit | Attending: Radiation Oncology | Admitting: Radiation Oncology

## 2015-04-12 DIAGNOSIS — Z510 Encounter for antineoplastic radiation therapy: Secondary | ICD-10-CM | POA: Diagnosis not present

## 2015-04-13 ENCOUNTER — Ambulatory Visit
Admission: RE | Admit: 2015-04-13 | Discharge: 2015-04-13 | Disposition: A | Discharge status: Home / Self Care | Payer: BLUE CROSS/BLUE SHIELD | Source: Ambulatory Visit | Attending: Radiation Oncology | Admitting: Radiation Oncology

## 2015-04-13 DIAGNOSIS — Z510 Encounter for antineoplastic radiation therapy: Secondary | ICD-10-CM | POA: Diagnosis not present

## 2015-04-14 ENCOUNTER — Ambulatory Visit
Admission: RE | Admit: 2015-04-14 | Discharge: 2015-04-14 | Disposition: A | Discharge status: Home / Self Care | Payer: BLUE CROSS/BLUE SHIELD | Source: Ambulatory Visit | Attending: Radiation Oncology | Admitting: Radiation Oncology

## 2015-04-14 DIAGNOSIS — Z510 Encounter for antineoplastic radiation therapy: Secondary | ICD-10-CM | POA: Diagnosis not present

## 2015-04-15 ENCOUNTER — Ambulatory Visit
Admission: RE | Admit: 2015-04-15 | Discharge: 2015-04-15 | Disposition: A | Discharge status: Home / Self Care | Payer: BLUE CROSS/BLUE SHIELD | Source: Ambulatory Visit | Attending: Radiation Oncology | Admitting: Radiation Oncology

## 2015-04-15 DIAGNOSIS — Z510 Encounter for antineoplastic radiation therapy: Secondary | ICD-10-CM | POA: Diagnosis not present

## 2015-04-18 ENCOUNTER — Ambulatory Visit
Admission: RE | Admit: 2015-04-18 | Discharge: 2015-04-18 | Disposition: A | Discharge status: Home / Self Care | Payer: BLUE CROSS/BLUE SHIELD | Source: Ambulatory Visit | Attending: Radiation Oncology | Admitting: Radiation Oncology

## 2015-04-18 DIAGNOSIS — Z510 Encounter for antineoplastic radiation therapy: Secondary | ICD-10-CM | POA: Diagnosis not present

## 2015-04-19 ENCOUNTER — Ambulatory Visit
Admission: RE | Admit: 2015-04-19 | Discharge: 2015-04-19 | Disposition: A | Discharge status: Home / Self Care | Payer: BLUE CROSS/BLUE SHIELD | Source: Ambulatory Visit | Attending: Radiation Oncology | Admitting: Radiation Oncology

## 2015-04-19 ENCOUNTER — Encounter: Payer: Self-pay | Admitting: Radiation Oncology

## 2015-04-19 ENCOUNTER — Ambulatory Visit
Admission: RE | Admit: 2015-04-19 | Payer: BLUE CROSS/BLUE SHIELD | Source: Ambulatory Visit | Admitting: Radiation Oncology

## 2015-04-19 VITALS — BP 127/80 | HR 67 | Temp 98.6°F | Ht 61.0 in | Wt 157.4 lb

## 2015-04-19 DIAGNOSIS — Z510 Encounter for antineoplastic radiation therapy: Secondary | ICD-10-CM | POA: Diagnosis not present

## 2015-04-19 DIAGNOSIS — C50411 Malignant neoplasm of upper-outer quadrant of right female breast: Secondary | ICD-10-CM

## 2015-04-19 NOTE — Progress Notes (Signed)
Weekly Management Note Current Dose: 41.4Gy  Projected Dose: 61 Gy   Narrative:  The patient presents for routine under treatment assessment.  CBCT/MVCT images/Port film x-rays were reviewed.  The chart was checked. Doing well. Occasional shooting breast pains. No skin complaints.   Physical Findings: Weight: 157 lb 6.4 oz (71.396 kg). Pink skin over right breast.   Impression:  The patient is tolerating radiation.  Plan:  Continue treatment as planned. Continue radiaplex.

## 2015-04-19 NOTE — Progress Notes (Signed)
Darlene Kelly has received 22 fractions.  Will receive fraction 23 today.  Appetite is good.  No pain today to right breast.  Skin to right breast itchy at times, redness to breast and has some shooting pain sensation about five times a day per patient since her surgery February 11, 2015.  Energy level is good likes to walk everyday. Had bone scan 04-05-15 BP 127/80 mmHg  Pulse 67  Temp(Src) 98.6 F (37 C) (Oral)  Ht 5\' 1"  (1.549 m)  Wt 157 lb 6.4 oz (71.396 kg)  BMI 29.76 kg/m2  SpO2 97%  Wt Readings from Last 3 Encounters:  04/19/15 157 lb 6.4 oz (71.396 kg)  04/11/15 157 lb 12.8 oz (71.578 kg)  04/05/15 159 lb 12.8 oz (72.485 kg)

## 2015-04-20 ENCOUNTER — Ambulatory Visit
Admission: RE | Admit: 2015-04-20 | Discharge: 2015-04-20 | Disposition: A | Discharge status: Home / Self Care | Payer: BLUE CROSS/BLUE SHIELD | Source: Ambulatory Visit | Attending: Radiation Oncology | Admitting: Radiation Oncology

## 2015-04-20 DIAGNOSIS — Z510 Encounter for antineoplastic radiation therapy: Secondary | ICD-10-CM | POA: Diagnosis not present

## 2015-04-21 ENCOUNTER — Ambulatory Visit
Admission: RE | Admit: 2015-04-21 | Discharge: 2015-04-21 | Disposition: A | Discharge status: Home / Self Care | Payer: BLUE CROSS/BLUE SHIELD | Source: Ambulatory Visit | Attending: Radiation Oncology | Admitting: Radiation Oncology

## 2015-04-21 DIAGNOSIS — Z510 Encounter for antineoplastic radiation therapy: Secondary | ICD-10-CM | POA: Diagnosis not present

## 2015-04-22 ENCOUNTER — Ambulatory Visit
Admission: RE | Admit: 2015-04-22 | Discharge: 2015-04-22 | Disposition: A | Discharge status: Home / Self Care | Payer: BLUE CROSS/BLUE SHIELD | Source: Ambulatory Visit | Attending: Radiation Oncology | Admitting: Radiation Oncology

## 2015-04-22 DIAGNOSIS — Z510 Encounter for antineoplastic radiation therapy: Secondary | ICD-10-CM | POA: Diagnosis not present

## 2015-04-25 ENCOUNTER — Ambulatory Visit
Admission: RE | Admit: 2015-04-25 | Discharge: 2015-04-25 | Disposition: A | Discharge status: Home / Self Care | Payer: BLUE CROSS/BLUE SHIELD | Source: Ambulatory Visit | Attending: Radiation Oncology | Admitting: Radiation Oncology

## 2015-04-25 DIAGNOSIS — Z510 Encounter for antineoplastic radiation therapy: Secondary | ICD-10-CM | POA: Diagnosis not present

## 2015-04-26 ENCOUNTER — Ambulatory Visit
Admission: RE | Admit: 2015-04-26 | Discharge: 2015-04-26 | Disposition: A | Discharge status: Home / Self Care | Payer: BLUE CROSS/BLUE SHIELD | Source: Ambulatory Visit | Attending: Radiation Oncology | Admitting: Radiation Oncology

## 2015-04-26 ENCOUNTER — Encounter: Payer: Self-pay | Admitting: Radiation Oncology

## 2015-04-26 VITALS — BP 125/55 | HR 62 | Temp 98.4°F | Ht 61.0 in | Wt 156.9 lb

## 2015-04-26 DIAGNOSIS — C50411 Malignant neoplasm of upper-outer quadrant of right female breast: Secondary | ICD-10-CM

## 2015-04-26 DIAGNOSIS — Z510 Encounter for antineoplastic radiation therapy: Secondary | ICD-10-CM | POA: Diagnosis not present

## 2015-04-26 NOTE — Progress Notes (Signed)
Darlene Kelly has received 28 fractions to her right breast.  Skin to right breast with hyperpigmentation entire breast field and small of dry desquamation under the breast, using the Radiaplex Bid, tenderness to the surgiial site.  Received boost 3rd treatment last week.  Denies having any pain. Energy level is great.  Appetite is great.  BP 125/55 mmHg  Pulse 62  Temp(Src) 98.4 F (36.9 C) (Oral)  Ht 5\' 1"  (1.549 m)  Wt 156 lb 14.4 oz (71.169 kg)  BMI 29.66 kg/m2  SpO2 99%  Wt Readings from Last 3 Encounters:  04/26/15 156 lb 14.4 oz (71.169 kg)  04/19/15 157 lb 6.4 oz (71.396 kg)  04/11/15 157 lb 12.8 oz (71.578 kg)

## 2015-04-26 NOTE — Progress Notes (Signed)
Weekly Management Note Current Dose: 51 Gy  Projected Dose: 61 Gy   Narrative:  The patient presents for routine under treatment assessment.  CBCT/MVCT images/Port film x-rays were reviewed.  The chart was checked. Doing well. Occasional shooting breast pains. Some peeling in inframammary fold (not painful).   Physical Findings: Weight: 156 lb 14.4 oz (71.169 kg). Pink skin over right breast. Some moist desquamation in inframammary fold.   Impression:  The patient is tolerating radiation.  Plan:  Continue treatment as planned. Continue radiaplex.

## 2015-04-27 ENCOUNTER — Ambulatory Visit
Admission: RE | Admit: 2015-04-27 | Discharge: 2015-04-27 | Disposition: A | Discharge status: Home / Self Care | Payer: BLUE CROSS/BLUE SHIELD | Source: Ambulatory Visit | Attending: Radiation Oncology | Admitting: Radiation Oncology

## 2015-04-27 DIAGNOSIS — Z510 Encounter for antineoplastic radiation therapy: Secondary | ICD-10-CM | POA: Diagnosis not present

## 2015-04-28 ENCOUNTER — Ambulatory Visit
Admission: RE | Admit: 2015-04-28 | Discharge: 2015-04-28 | Disposition: A | Discharge status: Home / Self Care | Payer: BLUE CROSS/BLUE SHIELD | Source: Ambulatory Visit | Attending: Radiation Oncology | Admitting: Radiation Oncology

## 2015-04-28 DIAGNOSIS — Z510 Encounter for antineoplastic radiation therapy: Secondary | ICD-10-CM | POA: Diagnosis not present

## 2015-04-29 ENCOUNTER — Ambulatory Visit
Admission: RE | Admit: 2015-04-29 | Discharge: 2015-04-29 | Disposition: A | Discharge status: Home / Self Care | Payer: BLUE CROSS/BLUE SHIELD | Source: Ambulatory Visit | Attending: Radiation Oncology | Admitting: Radiation Oncology

## 2015-04-29 DIAGNOSIS — Z510 Encounter for antineoplastic radiation therapy: Secondary | ICD-10-CM | POA: Diagnosis not present

## 2015-05-02 ENCOUNTER — Ambulatory Visit
Admission: RE | Admit: 2015-05-02 | Discharge: 2015-05-02 | Disposition: A | Discharge status: Home / Self Care | Payer: BLUE CROSS/BLUE SHIELD | Source: Ambulatory Visit | Attending: Radiation Oncology | Admitting: Radiation Oncology

## 2015-05-02 DIAGNOSIS — Z510 Encounter for antineoplastic radiation therapy: Secondary | ICD-10-CM | POA: Diagnosis not present

## 2015-05-03 ENCOUNTER — Encounter: Payer: Self-pay | Admitting: *Deleted

## 2015-05-03 ENCOUNTER — Ambulatory Visit
Admission: RE | Admit: 2015-05-03 | Discharge: 2015-05-03 | Disposition: A | Discharge status: Home / Self Care | Payer: BLUE CROSS/BLUE SHIELD | Source: Ambulatory Visit | Attending: Radiation Oncology | Admitting: Radiation Oncology

## 2015-05-03 ENCOUNTER — Encounter: Payer: Self-pay | Admitting: Radiation Oncology

## 2015-05-03 VITALS — BP 143/78 | HR 68 | Temp 98.4°F | Resp 20 | Wt 157.8 lb

## 2015-05-03 DIAGNOSIS — Z510 Encounter for antineoplastic radiation therapy: Secondary | ICD-10-CM | POA: Diagnosis not present

## 2015-05-03 DIAGNOSIS — C50411 Malignant neoplasm of upper-outer quadrant of right female breast: Secondary | ICD-10-CM

## 2015-05-03 NOTE — Progress Notes (Signed)
Name: Darlene Kelly   MRN: [id number]  Date:  04/05/15   DOB: October 30, 1950  Status:outpatient    DIAGNOSIS: Breast cancer of upper-outer quadrant of right female breast [facility])   Staging form: Breast, AJCC 7th Edition     Clinical stage from 07/21/2014: Stage IIA (T2, N0, M0) - Unsigned   CONSENT VERIFIED: yes   SET UP: Patient is setup supine   IMMOBILIZATION:  The following immobilization was used:Custom Moldable Pillow, breast board.   NARRATIVE: Darlene Kelly underwent complex simulation and treatment planning for her boost treatment today.  Her tumor volume was outlined on the planning CT scan. The depth of her cavity was felt to be appropriate for treatment with electrons    15  MeV electrons will be prescribed to the 100%  isodose line.   I personally oversaw and approved the construction of a unique block which will be used for beam modification purposes.  An isodose plan is requested.

## 2015-05-03 NOTE — Progress Notes (Signed)
  Radiation Oncology         [phone] ________________________________  Name: Breean Draves MRN: [id number]  Date: 05/03/2015  DOB: [birth year]  End of Treatment Note  Diagnosis:   Breast cancer of upper-outer quadrant of right female breast [facility])   Staging form: Breast, AJCC 7th Edition     Clinical stage from 07/21/2014: Stage IIA (T2, N0, M0) - Unsigned     Indication for treatment: Curative    Radiation treatment dates:   03/16/15-05/03/15  Site/dose:    Right breast / 45 Gray @ 1.8 Pearline Cables per fraction x 25 fractions Right breast boost / 16 Gray at [employer] per fraction x 8 fractions  Beams/energy:  Opposed Tangents / 6 MV photons En face / 15 MeV electrons  Narrative: The patient tolerated radiation treatment relatively well.   She had minimal skin redness and no fatigue.   Plan: The patient has completed radiation treatment. The patient will return to radiation oncology clinic for routine followup in one month. I advised them to call or return sooner if they have any questions or concerns related to their recovery or treatment.  ------------------------------------------------  [name], MD

## 2015-05-03 NOTE — Progress Notes (Signed)
Weekly rad txs right breast completed treatment today, erythema,skin intact, using radiaplex bid, has enough stated  In a couple weeks will  Start lotion with vit e, gave [facility] flyer and February 2017  ccalander to pateint, 1 month f/u appt card given as well, eating well, no c/o pain,  11:23 AM BP 143/78 mmHg  Pulse 68  Temp(Src) 98.4 F (36.9 C) (Oral)  Resp 20  Wt 157 lb 12.8 oz (71.578 kg)  Wt Readings from Last 3 Encounters:  05/03/15 157 lb 12.8 oz (71.578 kg)  04/26/15 156 lb 14.4 oz (71.169 kg)  04/19/15 157 lb 6.4 oz (71.396 kg)

## 2015-05-04 ENCOUNTER — Other Ambulatory Visit: Payer: Self-pay | Admitting: Adult Health

## 2015-05-04 DIAGNOSIS — C50411 Malignant neoplasm of upper-outer quadrant of right female breast: Secondary | ICD-10-CM

## 2015-05-05 ENCOUNTER — Telehealth: Payer: Self-pay | Admitting: Oncology

## 2015-05-05 NOTE — Telephone Encounter (Signed)
Left message for patient about the survivorship appointment following her appointment with Dr. Pablo Ledger on 3/16

## 2015-05-18 ENCOUNTER — Other Ambulatory Visit (HOSPITAL_BASED_OUTPATIENT_CLINIC_OR_DEPARTMENT_OTHER): Payer: Managed Care, Other (non HMO)

## 2015-05-18 DIAGNOSIS — C50411 Malignant neoplasm of upper-outer quadrant of right female breast: Secondary | ICD-10-CM | POA: Diagnosis not present

## 2015-05-18 LAB — CBC WITH DIFFERENTIAL/PLATELET
BASO%: 0.2 % (ref 0.0–2.0)
Basophils Absolute: 0 10*3/uL (ref 0.0–0.1)
EOS ABS: 0.1 10*3/uL (ref 0.0–0.5)
EOS%: 1.1 % (ref 0.0–7.0)
HCT: 39.7 % (ref 34.8–46.6)
HGB: 13.6 g/dL (ref 11.6–15.9)
LYMPH%: 13 % — AB (ref 14.0–49.7)
MCH: 29.3 pg (ref 25.1–34.0)
MCHC: 34.3 g/dL (ref 31.5–36.0)
MCV: 85.6 fL (ref 79.5–101.0)
MONO#: 0.5 10*3/uL (ref 0.1–0.9)
MONO%: 9.7 % (ref 0.0–14.0)
NEUT%: 76 % (ref 38.4–76.8)
NEUTROS ABS: 4.2 10*3/uL (ref 1.5–6.5)
Platelets: 193 10*3/uL (ref 145–400)
RBC: 4.64 10*6/uL (ref 3.70–5.45)
RDW: 14.9 % — ABNORMAL HIGH (ref 11.2–14.5)
WBC: 5.5 10*3/uL (ref 3.9–10.3)
lymph#: 0.7 10*3/uL — ABNORMAL LOW (ref 0.9–3.3)

## 2015-05-18 LAB — COMPREHENSIVE METABOLIC PANEL
ALT: 11 U/L (ref 0–55)
AST: 16 U/L (ref 5–34)
Albumin: 3.8 g/dL (ref 3.5–5.0)
Alkaline Phosphatase: 74 U/L (ref 40–150)
Anion Gap: 10 mEq/L (ref 3–11)
BILIRUBIN TOTAL: 0.42 mg/dL (ref 0.20–1.20)
BUN: 18.7 mg/dL (ref 7.0–26.0)
CHLORIDE: 105 meq/L (ref 98–109)
CO2: 26 meq/L (ref 22–29)
Calcium: 9.7 mg/dL (ref 8.4–10.4)
Creatinine: 0.7 mg/dL (ref 0.6–1.1)
EGFR: 90 mL/min/{1.73_m2} — AB (ref >90)
GLUCOSE: 155 mg/dL — AB (ref 70–140)
POTASSIUM: 4.6 meq/L (ref 3.5–5.1)
SODIUM: 141 meq/L (ref 136–145)
TOTAL PROTEIN: 7.3 g/dL (ref 6.4–8.3)

## 2015-05-19 ENCOUNTER — Encounter: Payer: BLUE CROSS/BLUE SHIELD | Admitting: Nurse Practitioner

## 2015-05-19 LAB — VITAMIN D 25 HYDROXY (VIT D DEFICIENCY, FRACTURES): VIT D 25 HYDROXY: 24.3 ng/mL — AB (ref 30.0–100.0)

## 2015-05-25 ENCOUNTER — Telehealth: Payer: Self-pay | Admitting: Oncology

## 2015-05-25 ENCOUNTER — Ambulatory Visit (HOSPITAL_BASED_OUTPATIENT_CLINIC_OR_DEPARTMENT_OTHER): Payer: Managed Care, Other (non HMO) | Admitting: Oncology

## 2015-05-25 VITALS — BP 124/73 | HR 75 | Temp 97.9°F | Resp 18 | Ht 61.0 in | Wt 157.4 lb

## 2015-05-25 DIAGNOSIS — C50411 Malignant neoplasm of upper-outer quadrant of right female breast: Secondary | ICD-10-CM

## 2015-05-25 DIAGNOSIS — Z170 Estrogen receptor positive status [ER+]: Secondary | ICD-10-CM

## 2015-05-25 MED ORDER — ANASTROZOLE 1 MG PO TABS: 1.0000 mg | ORAL_TABLET | Freq: Every day | ORAL | Status: DC

## 2015-05-25 NOTE — Progress Notes (Signed)
Darlene Kelly  [phone] [phone]     ID: Darlene Kelly DOB: 1950-07-22  MR#: [id number]  UMP#:[id number]  Patient Care Team: [name], MD as PCP - General (Family Medicine) [name], MD as Consulting Physician (General Surgery) [name], MD as Consulting Physician (Oncology) [name], MD as Consulting Physician (Radiation Oncology) [name], RN as Registered Nurse [name], RN as Registered Nurse [name], MD as Referring Physician (Cardiology) [name], MD as Referring Physician (Dermatology) [name], NP as Nurse Practitioner (Nurse Practitioner) PCP: [name], MD OTHER MD:  CHIEF COMPLAINT: Estrogen receptor positive breast cancer  CURRENT TREATMENT: Adjuvant radiation pending  BREAST CANCER HISTORY: From the original intake note:  Ishia first noted a change in her right breast September 2015. At that time however she was consumed by the care of her mother-in-law, who had severe Alzheimer's disease. She finally passed in January 2016, but Nesta did not share the information regarding the right breast with her husband until late March 2016. At that point she was set up for bilateral diagnostic mammography with tomography at the breast Center, for 11/20/2014. This found the breast density to be category B. In the upper outer quadrant of the right breast there was a 4 cm irregular spiculated mass. There were no other findings of concern. The mass was palpable and firm, and by ultrasound measured 3.3 cm. The right axilla was negative sonographically.  Biopsy of the right breast mass in question was obtained for 03/22/2015. 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  On 07/20/2014 the patient underwent bilateral  breast MRI. This measured the large irregular enhancing mass in the right breast at the 10:00 position at 5.0 cm. There were no additional worrisome findings in the right breast, and no findings in the left breast or any abnormal appearing lymph nodes.  The patient's subsequent history is as detailed below  INTERVAL HISTORY: Delona returns today for follow-up of her right-sided breast cancer. Since her last visit here she underwent radiation, which she completed late January 2017. She tolerated this well. She had minimal desquamation in the inframammary fold on the right. That has resolved. She does not complain of significant fatigue. She is now ready to start anti-estrogens.  REVIEW OF SYSTEMS: Darlene Kelly is very active area did she is not on a regular exercise program at present however. Plans to volunteer here. A detailed review of systems today was otherwise negative. "Everything spine".  PAST MEDICAL HISTORY: Past Medical History  Diagnosis Date  . Breast cancer of upper-outer quadrant of right female breast ([place]) 07/16/2014  . Diabetes mellitus without complication ([place])   . Hypertension   . Skin cancer   . Heart murmur     PAST SURGICAL HISTORY: Past Surgical History  Procedure Laterality Date  . Portacath placement N/A 08/03/2014    Procedure: INSERTION PORT-A-CATH;  Surgeon: [name], MD;  Location: [facility];  Service: General;  Laterality: N/A;  . Radioactive seed guided mastectomy with axillary sentinel lymph node biopsy Right 02/11/2015    Procedure: RIGHT RADIOACTIVE SEED LOCALIZATION LUMPECTOMY  WITH  RIGHT AXILLARY SENTINEL LYMPH NODE BIOPSY;  Surgeon: [name], MD;  Location: [facility];  Service: General;  Laterality: Right;  . Port-a-cath removal Left 02/11/2015    Procedure: REMOVAL PORT-A-CATH;  Surgeon: [name], MD;  Location:  [facility];  Service: General;  Laterality: Left;    FAMILY HISTORY Family History  Problem Relation Age of Onset  . Lung  cancer Father    the patient's father died from lung cancer in the setting of tobacco abuse at the age of 17. The patient's mother died from COPD at the age of 18. The patient has 3 brothers, no sisters. There is no history of breast or ovarian cancer in the family.  GYNECOLOGIC HISTORY:  No LMP recorded. Patient is postmenopausal. Menarche age 55, first live birth age 65. She is GX P3. She went through the change of life in her late 37s. She did not take hormone replacement.  SOCIAL HISTORY:  Analys worked for [employer], but is now retired. Her husband [name] is Programmer, systems for a [employer]. They have been married more than 40 years. Son [name]" Penne Rosenstock is a English as a second language teacher in [place]. Son [name] Jeraldean Wechter is a Dealer in [place]. Daughter Beretta Ginsberg is a Pharmacologist for [employer] in [place]. The patient has 3 grandchildren.    ADVANCED DIRECTIVES: In place   HEALTH MAINTENANCE: Social History  Substance Use Topics  . Smoking status: Former Smoker -- 0.50 packs/day for 12 years    Types: Cigarettes    Quit date: 04/02/1986  . Smokeless tobacco: Never Used  . Alcohol Use: 1.8 oz/week    3 Glasses of wine per week     Colonoscopy: Never  PAP:  Bone density: Never  Lipid panel:  Allergies  Allergen Reactions  . Latex Itching, Dermatitis and Rash  . Lipitor [Atorvastatin] Other (See Comments)    Bad leg cramps    Current Outpatient Prescriptions  Medication Sig Dispense Refill  . aspirin EC 81 MG tablet Take 81 mg by mouth at bedtime.     . cetirizine (ZYRTEC) 10 MG tablet Take 10 mg by mouth daily as needed for allergies.    . Cholecalciferol (VITAMIN D) 2000 UNITS CAPS Take 1 capsule by mouth daily.    . hyaluronate sodium (RADIAPLEXRX) GEL Apply 1 application topically 2 (two) times daily.    Marland Kitchen ibuprofen (ADVIL,MOTRIN) 200 MG tablet Take 400 mg by mouth every 6 (six) hours as needed for headache or  moderate pain.    Marland Kitchen lisinopril (PRINIVIL,ZESTRIL) 10 MG tablet Take 10 mg by mouth at bedtime.   6  . MELATONIN PO Take 10 mg by mouth at bedtime.     . metFORMIN (GLUCOPHAGE) 1000 MG tablet Take 1,000 mg by mouth 2 (two) times daily with a meal.    . metoprolol succinate (TOPROL-XL) 100 MG 24 hr tablet Take 100 mg by mouth every evening.     . Omega-3 Fatty Acids (FISH OIL PO) Take 1 capsule by mouth every evening. MEGA RED 500 MG    . rosuvastatin (CRESTOR) 40 MG tablet Take 20 mg by mouth 3 (three) times a week. Pt takes 1/2 tablet(8m) on Mondays, Wednesdays, Fridays.     No current facility-administered medications for this visit.    OBJECTIVE: Middle-aged white woman in no acute distress Filed Vitals:   05/25/15 1444  BP: 124/73  Pulse: 75  Temp: 97.9 F (36.6 C)  Resp: 18     Body mass index is 29.76 kg/(m^2).    ECOG FS:0 - Asymptomatic  Sclerae unicteric, EOMs intact Oropharynx clear, dentition in good repair No cervical or supraclavicular adenopathy Lungs no rales or rhonchi Heart regular rate and rhythm, 2/6  systolic murmur as previously noted Abd soft, nontender, positive bowel sounds MSK no focal spinal tenderness, no upper extremity lymphedema Neuro: nonfocal, well oriented, appropriate affect Breasts: The right breast is status post lumpectomy and radiation. There is minimal hyperpigmentation laterally. There is some induration in the superior lateral aspect of the areola, which is of course worse she has the chief scar. There is no evidence of disease recurrence. The overall cosmetic effect is good. The right axilla is benign. The left breast is unremarkable     LAB RESULTS:  CMP     Component Value Date/Time   NA 141 05/18/2015 1439   NA 138 02/03/2015 0944   K 4.6 05/18/2015 1439   K 4.6 02/03/2015 0944   CL 103 02/03/2015 0944   CO2 26 05/18/2015 1439   CO2 25 02/03/2015 0944   GLUCOSE 155* 05/18/2015 1439   GLUCOSE 124* 02/03/2015 0944   BUN 18.7  05/18/2015 1439   BUN 12 02/03/2015 0944   CREATININE 0.7 05/18/2015 1439   CREATININE 0.63 02/03/2015 0944   CALCIUM 9.7 05/18/2015 1439   CALCIUM 10.1 02/03/2015 0944   PROT 7.3 05/18/2015 1439   ALBUMIN 3.8 05/18/2015 1439   AST 16 05/18/2015 1439   ALT 11 05/18/2015 1439   ALKPHOS 74 05/18/2015 1439   BILITOT 0.42 05/18/2015 1439   GFRNONAA >60 02/03/2015 0944   GFRAA >60 02/03/2015 0944    INo results found for: SPEP, UPEP  Lab Results  Component Value Date   WBC 5.5 05/18/2015   NEUTROABS 4.2 05/18/2015   HGB 13.6 05/18/2015   HCT 39.7 05/18/2015   MCV 85.6 05/18/2015   PLT 193 05/18/2015      Chemistry      Component Value Date/Time   NA 141 05/18/2015 1439   NA 138 02/03/2015 0944   K 4.6 05/18/2015 1439   K 4.6 02/03/2015 0944   CL 103 02/03/2015 0944   CO2 26 05/18/2015 1439   CO2 25 02/03/2015 0944   BUN 18.7 05/18/2015 1439   BUN 12 02/03/2015 0944   CREATININE 0.7 05/18/2015 1439   CREATININE 0.63 02/03/2015 0944      Component Value Date/Time   CALCIUM 9.7 05/18/2015 1439   CALCIUM 10.1 02/03/2015 0944   ALKPHOS 74 05/18/2015 1439   AST 16 05/18/2015 1439   ALT 11 05/18/2015 1439   BILITOT 0.42 05/18/2015 1439       No results found for: LABCA2  No components found for: LABCA125  No results for input(s): INR in the last 168 hours.  Urinalysis No results found for: COLORURINE, APPEARANCEUR, LABSPEC, PHURINE, GLUCOSEU, HGBUR, BILIRUBINUR, KETONESUR, PROTEINUR, UROBILINOGEN, NITRITE, LEUKOCYTESUR  STUDIES: No results found.  ASSESSMENT: 65 y.o. [place] woman status post right breast upper outer quadrant biopsy 07/13/2014, for a clinical T3 N0, stage IIB invasive ductal carcinoma, grade 3, estrogen receptor positive, HER-2 and progesterone receptor negative, with an MIB-1 of 40%  (1) neoadjuvant chemotherapy starting 08/15/2013, consisting of doxorubicin and cyclophosphamide in dose dense fashion 4, completed 09/28/2014, followed by  weekly paclitaxel 12 starting 10/19/2014, completed 01/04/2015  (2) status post right lumpectomy and sentinel lymph node sampling 02/11/2015 for a ypTis ypN0 ductal carcinoma in situ, with negative margins. This counts as a complete pathologic response  (3) adjuvant radiation completed 05/03/2015  (4) anastrozole started 05/25/2015  (a) bone density 04/05/2015 was normal with a T score of -0.9  PLAN: Robbye has completed the local treatment for her breast cancer. She understands that doesn't great  job of getting the cancer out of the breast and cleaning the rest of the breast out to lower the risk of local recurrence  She is now ready to start the second part of her systemic therapy. The first part of course was her chemotherapy, which produced a complete pathologic response. That is very favorable.  Anti-estrogen pills will further reduce her risk of local recurrence, which is already quite low. We will reduce her risk of persistent systemic recurrence by nearly half. They will also cut in half her risk of developing a new breast cancer in either breast in the future.  Since her bone density is normal I think she is a good candidate for anastrozole. We discussed the possible toxicities, side effects and complications of that agent and I have gone ahead and placed the prescription in for her. She is, let us know if she has any difficulties. She understands any symptoms are reversible when we stopped the pill and that there are alternatives.  She feels very positive and wishes to volunteer here. I encouraged that. I also encouraged her to participate in the finding your new normal.  She will return to see me sometime in May. If she is tolerating the anastrozole well at that time we will start every six-month visits for the next year. [name], MD   05/25/2015 2:47 PM

## 2015-05-25 NOTE — Telephone Encounter (Signed)
appt made and avs printed °

## 2015-06-13 NOTE — Progress Notes (Signed)
.   Skin status: Right breast skin still with some tanning.  Some tenderness from her surgery. Lotion being used: Using a lotion with vit E Have you seen your medical oncologist? Date If not ,when is appointment 2-22-17Dr. [name] again 08-03-15 ER+,have started AI or Tamoxifen? If not, why?Started 05-25-15 Anasterozole Discuss survivorship appointment:  06-16-15 Offer referral to [facility] 06-13-15 Appetite:Good Pain:None Energy level:Great BP 114/56 mmHg  Pulse 70  Temp(Src) 98.8 F (37.1 C) (Oral)  Resp 16  Ht 5\' 1"  (1.549 m)  Wt 157 lb 4.8 oz (71.351 kg)  BMI 29.74 kg/m2  SpO2 99%

## 2015-06-16 ENCOUNTER — Encounter: Payer: Self-pay | Admitting: Radiation Oncology

## 2015-06-16 ENCOUNTER — Ambulatory Visit
Admission: RE | Admit: 2015-06-16 | Discharge: 2015-06-16 | Disposition: A | Discharge status: Home / Self Care | Payer: Managed Care, Other (non HMO) | Source: Ambulatory Visit | Attending: Radiation Oncology | Admitting: Radiation Oncology

## 2015-06-16 ENCOUNTER — Encounter: Payer: Self-pay | Admitting: Nurse Practitioner

## 2015-06-16 ENCOUNTER — Ambulatory Visit (HOSPITAL_BASED_OUTPATIENT_CLINIC_OR_DEPARTMENT_OTHER): Payer: Managed Care, Other (non HMO) | Admitting: Nurse Practitioner

## 2015-06-16 VITALS — BP 114/56 | HR 70 | Temp 98.8°F | Resp 16 | Ht 61.0 in | Wt 157.3 lb

## 2015-06-16 VITALS — BP 126/74 | HR 68 | Temp 98.6°F | Resp 17 | Ht 61.0 in | Wt 157.4 lb

## 2015-06-16 DIAGNOSIS — Z170 Estrogen receptor positive status [ER+]: Secondary | ICD-10-CM | POA: Diagnosis not present

## 2015-06-16 DIAGNOSIS — C50411 Malignant neoplasm of upper-outer quadrant of right female breast: Secondary | ICD-10-CM

## 2015-06-16 DIAGNOSIS — Z79811 Long term (current) use of aromatase inhibitors: Secondary | ICD-10-CM

## 2015-06-16 NOTE — Progress Notes (Signed)
CLINIC:  Cancer Survivorship   REASON FOR VISIT:  Routine follow-up post-treatment for a recent history of breast cancer.  BRIEF ONCOLOGIC HISTORY:    Breast cancer of upper-outer quadrant of right female breast ([place])   07/09/2014 Mammogram Right breast: Within the upper-outer right breast middle depth underlying the palpable marker is a 4 cm irregular spiculated mass. No additional concerning masses, calcifications or architectural distortion identified within either breast.    07/09/2014 Breast US Right breast: 3.2 x 3.3 x 3.0 cm irregular hypoechoic mass at 10 o'clock position 6 cm from the nipple with internal color vascularity. No definite enlarged right axillary lymph nodes are able to be identified with ultrasound.   07/14/2014 Initial Biopsy Breast, right, needle core biopsy, 10 o'clock: Invasive ductal carcinoma, ER+ (50%), PR- (0%), HER2/neu negative, Ki67 40%   07/20/2014 Breast MRI Right breast: 5.0 cm irregular enhancing mass located at 10 o'clock position corresponding to the diagnosed invasive ductal carcinoma. No additional findings.   07/20/2014 Clinical Stage Stage IIB: T3 N0   08/16/2014 - 01/04/2015 Neo-Adjuvant Chemotherapy Doxorubicin and cyclophosphamide x 4 followed by weekly paclitaxel x 12   10/11/2014 Breast MRI The previously demonstrated right breast malignancy at 11 o'clock, middle to posterior depth, has decreased in size and measures 2.6 by 2.7 by 2.7 cm   12/29/2014 Breast MRI Continued interval decrease in enhancement of the known carcinoma centered within the upper, outer right breast. Minimal residual enhancement is seen.   02/11/2015 Definitive Surgery Right lumpectomy/SLNB: complete pathologic response with underlying DCIS   02/11/2015 Pathologic Stage Stage 0: ypTis ypN0   03/16/2015 - 05/03/2015 Radiation Therapy Right breast / 45 Gray @ 1.8 Pearline Cables per fraction x 25 fractions Right breast boost / 16 Gray at [employer] per fraction x 8 fractions   05/25/2015 -   Anti-estrogen oral therapy Anastrozole 1 mg daily.    INTERVAL HISTORY:  Darlene Kelly presents to the [facility] today for our initial meeting to review her survivorship care plan detailing her treatment course for breast cancer, as well as monitoring long-term side effects of that treatment, education regarding health maintenance, screening, and overall wellness and health promotion.     Overall, Darlene Kelly reports feeling quite well since completing her radiation therapy approximately one and a half months ago. She has noted some thickening in her right breast following radiation but denies any mass or lesion.  The skin changes overlying her right breast have improved.  She has some fatigue but it is minimal.  She has exercised regularly during and since completing treatment. She denies any headache, cough, shortness of breath or bone pain.  She has a good appetite and denies any weight loss.  She is tolerating her anastrozole without hot flashes or vaginal changes.  She believes that the exercise is helping minimize any joint aches.   REVIEW OF SYSTEMS:  General: Denies fever, chills, unintentional weight loss, or generalized fatigue.  HEENT: Denies visual changes, hearing loss, mouth sores or difficulty swallowing. Cardiac: Denies palpitations, chest pain, and lower extremity edema.  Respiratory: Denies wheeze or dyspnea on exertion.  Breast: Thickening in right breast as above, otherwise, denies any new nodularity, masses, tenderness, nipple changes, or nipple discharge.  GI: Denies abdominal pain, constipation, diarrhea, nausea, or vomiting.  GU: Denies dysuria, hematuria, vaginal bleeding, vaginal discharge, or vaginal dryness.  Musculoskeletal: Denies joint or bone pain.  Neuro: Denies recent fall or numbness / tingling in her extremities. Skin: Denies rash, pruritis, or open wounds.  Psych: Denies depression, anxiety, insomnia, or memory loss.   A 14-point review of systems was  completed and was negative, except as noted above.   ONCOLOGY TREATMENT TEAM:  1. Surgeon:  Dr. [name] at [facility]  2. Medical Oncologist: Dr. Jana Hakim 3. Radiation Oncologist: Dr. Pablo Ledger    PAST MEDICAL/SURGICAL HISTORY:  Past Medical History  Diagnosis Date  . Breast cancer of upper-outer quadrant of right female breast ([place]) 07/16/2014  . Diabetes mellitus without complication ([place])   . Hypertension   . Skin cancer   . Heart murmur    Past Surgical History  Procedure Laterality Date  . Portacath placement N/A 08/03/2014    Procedure: INSERTION PORT-A-CATH;  Surgeon: [name], MD;  Location: [facility];  Service: General;  Laterality: N/A;  . Radioactive seed guided mastectomy with axillary sentinel lymph node biopsy Right 02/11/2015    Procedure: RIGHT RADIOACTIVE SEED LOCALIZATION LUMPECTOMY  WITH  RIGHT AXILLARY SENTINEL LYMPH NODE BIOPSY;  Surgeon: [name], MD;  Location: [facility];  Service: General;  Laterality: Right;  . Port-a-cath removal Left 02/11/2015    Procedure: REMOVAL PORT-A-CATH;  Surgeon: [name], MD;  Location: [facility];  Service: General;  Laterality: Left;     ALLERGIES:  Allergies  Allergen Reactions  . Latex Itching, Dermatitis and Rash  . Lipitor [Atorvastatin] Other (See Comments)    Bad leg cramps     CURRENT MEDICATIONS:  Current Outpatient Prescriptions on File Prior to Visit  Medication Sig Dispense Refill  . anastrozole (ARIMIDEX) 1 MG tablet Take 1 tablet (1 mg total) by mouth daily. 90 tablet 4  . aspirin EC 81 MG tablet Take 81 mg by mouth at bedtime.     . cetirizine (ZYRTEC) 10 MG tablet Take 10 mg by mouth daily as needed for allergies.    . Cholecalciferol (VITAMIN D) 2000 UNITS CAPS Take 1 capsule by mouth daily.    Marland Kitchen ibuprofen (ADVIL,MOTRIN) 200 MG tablet Take 400 mg by mouth every 6 (six) hours as needed for headache or moderate pain.    Marland Kitchen lisinopril (PRINIVIL,ZESTRIL) 10 MG tablet Take 10 mg  by mouth at bedtime.   6  . MELATONIN PO Take 10 mg by mouth at bedtime.     . metFORMIN (GLUCOPHAGE) 1000 MG tablet Take 1,000 mg by mouth 2 (two) times daily with a meal.    . metoprolol succinate (TOPROL-XL) 100 MG 24 hr tablet Take 100 mg by mouth every evening.     . Omega-3 Fatty Acids (FISH OIL PO) Take 1 capsule by mouth every evening. MEGA RED 500 MG    . rosuvastatin (CRESTOR) 40 MG tablet Take 20 mg by mouth 3 (three) times a week. Pt takes 1/2 tablet(44m) on Mondays, Wednesdays, Fridays.     No current facility-administered medications on file prior to visit.     ONCOLOGIC FAMILY HISTORY:  Family History  Problem Relation Age of Onset  . Lung cancer Father      GENETIC COUNSELING/TESTING: No    SOCIAL HISTORY:  BSoraiya Ahneris married and [place]  She has 3 children. Ms. CMancinois currently retired from [employer]  She is a former smoker and denies any current or history of illicit drug use.  She drinks approximately 3 glasses / wine a week.   PHYSICAL EXAMINATION:  Vital Signs: Filed Vitals:   06/16/15 1416  BP: 126/74  Pulse: 68  Temp: 98.6 F (37 C)  Resp: 17   ECOG Performance Status: 0  General: Well-nourished, well-appearing female in no acute distress.  She is unaccompanied in clinic today.   HEENT: Head is atraumatic and normocephalic.  Pupils equal and reactive to light and accomodation. Conjunctivae clear without exudate.  Sclerae anicteric. Oral mucosa is pink, moist, and intact without lesions.  Oropharynx is pink without lesions or erythema.  Lymph: No cervical, supraclavicular, infraclavicular, or axillary lymphadenopathy noted on palpation.  Cardiovascular: Regular rate and rhythm without rubs, or gallops. Positive systolic ejection murmur. Respiratory: Clear to auscultation bilaterally. Chest expansion symmetric without accessory muscle use on inspiration or expiration.  GI: Abdomen soft and  round. No tenderness to palpation. Bowel sounds normoactive in 4 quadrants. GU: Deferred.  Neuro: No focal deficits. Steady gait.  Psych: Mood and affect normal and appropriate for situation.  Extremities: No edema, cyanosis, or clubbing.  Skin: Warm and dry. No open lesions noted.   LABORATORY DATA:  None for this visit.  DIAGNOSTIC IMAGING:  None for this visit.     ASSESSMENT AND PLAN:   1. 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  Darlene Kelly is doing well without clinical symptoms worrisome for disease recurrence. She will follow-up with her medical oncologist, Dr. Jana Hakim, in May 2017 with history and physical examination per surveillance protocol.  She will continue her anti-estrogen therapy with anastrozole at this time. She was instructed to make [place] aware if she begins to experience any new or increased side effects of the medication and I could see her back in clinic to help manage those side effects, as needed. A comprehensive survivorship care plan and treatment summary was reviewed with the patient today detailing her breast cancer diagnosis, treatment course, potential late/long-term effects of treatment, appropriate follow-up care with recommendations for the future, and patient education resources.  A copy of this summary, along with a letter will be sent to the patient's primary care provider via in basket message after today's visit.  Ms. Sumida is welcome to return to the Survivorship Clinic in the future, as needed; no follow-up will be scheduled at this time.    2. Bone health:  Given Ms. Tremain's age/history of breast cancer and her current  treatment regimen including endocrine therapy with anastrozole, she is at risk for bone demineralization.  Per our records, her last DEXA scan was performed in January 2017 and was normal. We will continue to monitor this closely while she is on endocrine therapy.  In the meantime, she was encouraged to increase her consumption of foods rich in calcium and vitamin D as well as to increase her weight-bearing activities.  She was given education on specific activities to promote bone health.  3. Cancer screening:  Due to Ms. Racca's history and her age, she should receive screening for skin cancers, colon cancer, and gynecologic cancers.  The information and recommendations are listed on the patient's comprehensive care plan/treatment summary and were reviewed in detail with the patient.    4. Health maintenance and wellness promotion: Ms. Diloreto was encouraged to consume 5-7 servings of fruits and vegetables per day. We reviewed the "Nutrition Rainbow" handout, as well as discussed recommendations to maximize nutrition and minimize recurrence, such as increased intake  of fruits, vegetables, lean proteins, and minimizing the intake of red meats and processed foods.  She was also encouraged to continue to engage in moderate to vigorous exercise for 30 minutes per day most days of the week. We discussed the [employer] fitness program, which is designed for cancer survivors to help them become more physically fit after cancer treatments.  She was instructed to limit her alcohol consumption and continue to abstain from tobacco use.  A copy of the "Take Control of Your Health" brochure was given to her reinforcing these recommendations.   5. Support services/counseling: It is not uncommon for this period of the patient's cancer care trajectory to be one of many emotions and stressors.  We discussed an opportunity for her to participate in the next session of [facility] ("Finding Your New Normal") support group series  designed for patients after they have completed treatment. Ms. Colonna was encouraged to take advantage of our many other support services programs, support groups, and/or counseling in coping with her new life as a cancer survivor after completing anti-cancer treatment. She was offered support today through active listening and expressive supportive counseling.  She was given information regarding our available services and encouraged to contact me with any questions or for help enrolling in any of our support group/programs. Ms. 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  She will be a great asset to our patients here at the [facility] and I believe will gain great satisfaction with her work here.   A total of 50 minutes of face-to-face time was spent with this patient with greater than 50% of that time in counseling and care-coordination.   [name], NP  Survivorship Program [facility] [phone]   Note: PRIMARY CARE PROVIDER [name], [place] [phone]

## 2015-06-16 NOTE — Progress Notes (Signed)
Department of Radiation Oncology  Phone:  [phone] Fax:        [phone]   Name: Darlene Kelly MRN: [id number]  DOB: 1951-02-22  Date: 06/16/2015  Follow Up Visit Note  Diagnosis: Breast cancer of upper-outer quadrant of right female breast [facility])   Staging form: Breast, AJCC 7th Edition     Clinical stage from 07/21/2014: Stage IIA (T2, N0, M0) - Unsigned     Pathologic stage from 02/11/2015: Stage 0 (yTis (DCIS), N0, cM0) - Unsigned    Breast cancer of upper-outer quadrant of right female breast ([place])   07/09/2014 Mammogram Right breast: Within the upper-outer right breast middle depth underlying the palpable marker is a 4 cm irregular spiculated mass. No additional concerning masses, calcifications or architectural distortion identified within either breast.    07/09/2014 Breast US Right breast: 3.2 x 3.3 x 3.0 cm irregular hypoechoic mass at 10 o'clock position 6 cm from the nipple with internal color vascularity. No definite enlarged right axillary lymph nodes are able to be identified with ultrasound.   07/14/2014 Initial Biopsy Breast, right, needle core biopsy, 10 o'clock: Invasive ductal carcinoma, ER+ (50%), PR- (0%), HER2/neu negative, Ki67 40%   07/20/2014 Breast MRI Right breast: 5.0 cm irregular enhancing mass located at 10 o'clock position corresponding to the diagnosed invasive ductal carcinoma. No additional findings.   07/20/2014 Clinical Stage Stage IIB: T3 N0   08/16/2014 - 01/04/2015 Neo-Adjuvant Chemotherapy Doxorubicin and cyclophosphamide x 4 followed by weekly paclitaxel x 12   10/11/2014 Breast MRI The previously demonstrated right breast malignancy at 11 o'clock, middle to posterior depth, has decreased in size and measures 2.6 by 2.7 by 2.7 cm   12/29/2014 Breast MRI Continued interval decrease in enhancement of the known carcinoma centered within the upper, outer right breast. Minimal residual enhancement is seen.   02/11/2015 Definitive Surgery Right  lumpectomy/SLNB: complete pathologic response with underlying DCIS   02/11/2015 Pathologic Stage Stage 0: ypTis ypN0   03/16/2015 - 05/03/2015 Radiation Therapy Right breast / 45 Gray @ 1.8 Pearline Cables per fraction x 25 fractions Right breast boost / 16 Gray at [employer] per fraction x 8 fractions   05/25/2015 -  Anti-estrogen oral therapy Anastrozole 1 mg daily.   Summary and Interval since last radiation: 05/16/14-05/03/15 Site/dose:    Right breast / 45 Gray @ 1.8 Pearline Cables per fraction x 25 fractions Right breast boost / 16 Gray at [employer] per fraction x 8 fractions   Interval History: Darlene Kelly presents today for routine followup. She saw Dr. Jana Hakim in February. She has started anastrozole and has a follow up visit with Dr.[name]  In May. She has tolerated anastrozole well, her skin has healed up well and she is pleased with her cosmetic result. She will be due for her mammograms in August. She has a survivorship visit immediately after today's visit. Taking anastrozole in the mornings with minimal side effects. 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   Skin status: Right breast skin still with some tanning. Some tenderness from her surgery.  Lotion being used: Using a lotion with vit E  Have you seen your medical oncologist? Date If not ,when is appointment 05-25-15 Dr. Jana Hakim again 08-03-15  ER+,have started AI or Tamoxifen? If not, why? Started 05-25-15 Anasterozole  Discuss survivorship appointment: 06-16-15  Offer referral to [employer]/[facility] 06-13-15  Appetite:Good  Pain:None     Physical Exam:  Filed Vitals:   06/16/15 1334  BP: 114/56  Pulse: 70  Temp: 98.8 F (37.1 C)  TempSrc: Oral  Resp: 16  Height: _0  (1.549 m)  Weight: 157 lb 4.8 oz (71.351 kg)  SpO2: 99%  The patient is alert and oriented on today's visit.  Skin is healed well with minimal hyperpgmentation.   IMPRESSION: Darlene Kelly is a 65 y.o. female diagnosed withT2N0 (Stage II) Right Breast  Cancer  PLAN:  She is doing well. We discussed the need for follow up every 4-6 months which she has scheduled.  We discussed the need for yearly mammograms which she can schedule with her OBGYN or with medical oncology. We discussed the need for sun protection in the treated area.  She can always call me with questions.  I will follow up with her on an as needed basis.   I put in orders for mammograms in April-May.   [name], MD  This document serves as a record of services personally performed by [name], MD. It was created on her behalf by Derek Mound, a trained medical scribe. The creation of this record is based on the scribe's personal observations and the provider's statements to them. This document has been checked and approved by the attending provider.

## 2015-06-17 ENCOUNTER — Telehealth: Payer: Self-pay | Admitting: *Deleted

## 2015-06-17 NOTE — Telephone Encounter (Signed)
Called patient to inform of mammogram on 07-11-15- arrival time - 9 am @ [facility], lvm for a return call

## 2015-07-11 ENCOUNTER — Ambulatory Visit
Admission: RE | Admit: 2015-07-11 | Discharge: 2015-07-11 | Disposition: A | Discharge status: Home / Self Care | Payer: 59 | Source: Ambulatory Visit | Attending: Radiation Oncology | Admitting: Radiation Oncology

## 2015-07-11 DIAGNOSIS — C50411 Malignant neoplasm of upper-outer quadrant of right female breast: Secondary | ICD-10-CM

## 2015-07-12 ENCOUNTER — Telehealth: Payer: Self-pay | Admitting: *Deleted

## 2015-07-12 NOTE — Telephone Encounter (Signed)
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 Discussed with Dr. Jana Hakim and it is ok. Called patient and let her know. She verbalized understanding.

## 2015-07-26 ENCOUNTER — Other Ambulatory Visit: Payer: Self-pay

## 2015-07-26 DIAGNOSIS — C50411 Malignant neoplasm of upper-outer quadrant of right female breast: Secondary | ICD-10-CM

## 2015-07-27 ENCOUNTER — Other Ambulatory Visit (HOSPITAL_BASED_OUTPATIENT_CLINIC_OR_DEPARTMENT_OTHER): Payer: 59

## 2015-07-27 DIAGNOSIS — C50411 Malignant neoplasm of upper-outer quadrant of right female breast: Secondary | ICD-10-CM | POA: Diagnosis not present

## 2015-07-27 LAB — CBC WITH DIFFERENTIAL/PLATELET
BASO%: 0.2 % (ref 0.0–2.0)
Basophils Absolute: 0 10*3/uL (ref 0.0–0.1)
EOS ABS: 0.1 10*3/uL (ref 0.0–0.5)
EOS%: 2.3 % (ref 0.0–7.0)
HEMATOCRIT: 38.5 % (ref 34.8–46.6)
HEMOGLOBIN: 13.3 g/dL (ref 11.6–15.9)
LYMPH#: 0.7 10*3/uL — AB (ref 0.9–3.3)
LYMPH%: 13.5 % — ABNORMAL LOW (ref 14.0–49.7)
MCH: 30.3 pg (ref 25.1–34.0)
MCHC: 34.5 g/dL (ref 31.5–36.0)
MCV: 87.7 fL (ref 79.5–101.0)
MONO#: 0.5 10*3/uL (ref 0.1–0.9)
MONO%: 8.8 % (ref 0.0–14.0)
NEUT%: 75.2 % (ref 38.4–76.8)
NEUTROS ABS: 4 10*3/uL (ref 1.5–6.5)
Platelets: 169 10*3/uL (ref 145–400)
RBC: 4.39 10*6/uL (ref 3.70–5.45)
RDW: 14.1 % (ref 11.2–14.5)
WBC: 5.3 10*3/uL (ref 3.9–10.3)

## 2015-07-27 LAB — COMPREHENSIVE METABOLIC PANEL
ALT: 13 U/L (ref 0–55)
ANION GAP: 10 meq/L (ref 3–11)
AST: 16 U/L (ref 5–34)
Albumin: 3.9 g/dL (ref 3.5–5.0)
Alkaline Phosphatase: 56 U/L (ref 40–150)
BILIRUBIN TOTAL: 0.49 mg/dL (ref 0.20–1.20)
BUN: 17.9 mg/dL (ref 7.0–26.0)
CO2: 24 meq/L (ref 22–29)
CREATININE: 0.7 mg/dL (ref 0.6–1.1)
Calcium: 9.3 mg/dL (ref 8.4–10.4)
Chloride: 105 mEq/L (ref 98–109)
EGFR: >90 mL/min/{1.73_m2} (ref >90)
GLUCOSE: 103 mg/dL (ref 70–140)
Potassium: 4 mEq/L (ref 3.5–5.1)
SODIUM: 139 meq/L (ref 136–145)
TOTAL PROTEIN: 6.7 g/dL (ref 6.4–8.3)

## 2015-08-03 ENCOUNTER — Encounter: Payer: Self-pay | Admitting: *Deleted

## 2015-08-03 ENCOUNTER — Telehealth: Payer: Self-pay | Admitting: Oncology

## 2015-08-03 ENCOUNTER — Ambulatory Visit (HOSPITAL_BASED_OUTPATIENT_CLINIC_OR_DEPARTMENT_OTHER): Payer: 59 | Admitting: Oncology

## 2015-08-03 VITALS — BP 123/48 | HR 65 | Temp 98.6°F | Resp 18 | Ht 61.0 in | Wt 159.2 lb

## 2015-08-03 DIAGNOSIS — Z170 Estrogen receptor positive status [ER+]: Secondary | ICD-10-CM

## 2015-08-03 DIAGNOSIS — C50411 Malignant neoplasm of upper-outer quadrant of right female breast: Secondary | ICD-10-CM | POA: Diagnosis not present

## 2015-08-03 DIAGNOSIS — Z79811 Long term (current) use of aromatase inhibitors: Secondary | ICD-10-CM

## 2015-08-03 NOTE — Telephone Encounter (Signed)
appt made and avs printed °

## 2015-08-03 NOTE — Progress Notes (Signed)
[facility]  Telephone:([phone] Fax:(336) [phone]     ID: Darlene Kelly DOB: June 28, 1950  MR#: [id number]  GXI#:[id number]  Patient Care Team: Darlene Lek, MD as PCP - General (Family Medicine) Darlene Fellows, MD as Consulting Physician (General Surgery) Darlene Dell, MD as Consulting Physician (Oncology) Darlene Hare, MD as Consulting Physician (Radiation Oncology) Darlene Proud, RN as Registered Nurse Darlene Angelica, RN as Registered Nurse Darlene Many, MD as Referring Physician (Cardiology) Darlene Krauss, MD as Referring Physician (Dermatology) Darlene Hartshorn, NP as Nurse Practitioner (Nurse Practitioner) Darlene Fick, NP as Nurse Practitioner (Hematology and Oncology) PCP: Darlene Lek, MD OTHER MD:  CHIEF COMPLAINT: Estrogen receptor positive breast cancer  CURRENT TREATMENT: Anastrozole  BREAST CANCER HISTORY: From the original intake note:  Darlene Kelly first noted a change in her right breast September 2015. At that time however she was consumed by the care of her mother-in-law, who had severe Alzheimer's disease. She finally passed in January 2016, but Darlene Kelly did not share the information regarding the right breast with her husband until late March 2016. At that point she was set up for bilateral diagnostic mammography with tomography at the breast Center, for 11/20/2014. This found the breast density to be category B. In the upper outer quadrant of the right breast there was a 4 cm irregular spiculated mass. There were no other findings of concern. The mass was palpable and firm, and by ultrasound measured 3.3 cm. The right axilla was negative sonographically.  Biopsy of the right breast mass in question was obtained for 03/22/2015. 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  On 07/20/2014 the patient underwent bilateral breast MRI. This measured the large irregular enhancing mass in the right breast at the 10:00 position at 5.0 cm. There were no additional worrisome findings in the right breast, and no findings in the left breast or any abnormal appearing lymph nodes.  The patient's subsequent history is as detailed below  INTERVAL HISTORY: Darlene Kelly returns today for follow-up of her estrogen receptor positive breast cancer. She continues on anastrozole, with excellent tolerance. She has not had significant problems with hot flashes or vaginal dryness. She has had a little bit of shoulder pain bilaterally which I don't think is going to be related to this drug. She has not had any other arthralgias or myalgias. She obtains a drug for free.   REVIEW OF SYSTEMS: She is going to the gym several times and weak. The shoulders are little sore sometimes but not all the time. She doesn't have any particular soreness or pain anywhere else except the lower back which is not a new problem a detailed review of systems today was otherwise stable.  PAST MEDICAL HISTORY: Past Medical History  Diagnosis Date  . Breast cancer of upper-outer quadrant of right female breast (HCC) 07/16/2014  . Diabetes mellitus without complication (HCC)   . Hypertension   . Skin cancer   . Heart murmur     PAST SURGICAL HISTORY: Past Surgical History  Procedure Laterality Date  . Portacath placement N/A 08/03/2014    Procedure: INSERTION PORT-A-CATH;  Surgeon: Darlene Fellows, MD;  Location: [facility];  Service: General;  Laterality: N/A;  . Radioactive seed guided mastectomy with axillary sentinel lymph node biopsy Right 02/11/2015    Procedure: RIGHT RADIOACTIVE SEED LOCALIZATION LUMPECTOMY  WITH  RIGHT  AXILLARY SENTINEL LYMPH NODE BIOPSY;  Surgeon: Darlene Seltzer, MD;  Location: [place] [facility];  Service: General;  Laterality: Right;  . Port-a-cath removal Left 02/11/2015    Procedure:  REMOVAL PORT-A-CATH;  Surgeon: Darlene Seltzer, MD;  Location: [facility];  Service: General;  Laterality: Left;    FAMILY HISTORY Family History  Problem Relation Age of Onset  . Lung cancer Father    the patient's father died from lung cancer in the setting of tobacco abuse at the age of 76. The patient's mother died from COPD at the age of 60. The patient has 3 brothers, no sisters. There is no history of breast or ovarian cancer in the family.  GYNECOLOGIC HISTORY:  No LMP recorded. Patient is postmenopausal. Menarche age 67, first live birth age 45. She is GX P3. She went through the change of life in her late 19s. She did not take hormone replacement.  SOCIAL HISTORY:  Darlene Kelly worked for [employer], but is now retired. Her husband Darlene Kelly is Programmer, systems for a [employer]. They have been married more than 40 years. Son Darlene Kelly  "Darlene" Johniece Kelly is a English as a second language teacher in [place]. Son Darlene Kelly "Darlene" Mirah Kelly is a Dealer in [place]. [name] is a Pharmacologist for [employer] in [place]. The patient has 3 grandchildren.    ADVANCED DIRECTIVES: In place   HEALTH MAINTENANCE: Social History  Substance Use Topics  . Smoking status: Former Smoker -- 0.50 packs/day for 12 years    Types: Cigarettes    Quit date: 04/02/1986  . Smokeless tobacco: Never Used  . Alcohol Use: 1.8 oz/week    3 Glasses of wine per week     Colonoscopy: Never  PAP:  Bone density: Never  Lipid panel:  Allergies  Allergen Reactions  . Latex Itching, Dermatitis and Rash  . Lipitor [Atorvastatin] Other (See Comments)    Bad leg cramps    Current Outpatient Prescriptions  Medication Sig Dispense Refill  . anastrozole (ARIMIDEX) 1 MG tablet Take 1 tablet (1 mg total) by mouth daily. 90 tablet 4  . aspirin EC 81 MG tablet Take 81 mg by mouth at bedtime.     . cetirizine (ZYRTEC) 10 MG tablet Take 10 mg by mouth daily as needed for allergies.    .  Cholecalciferol (VITAMIN D) 2000 UNITS CAPS Take 1 capsule by mouth daily.    Marland Kitchen ibuprofen (ADVIL,MOTRIN) 200 MG tablet Take 400 mg by mouth every 6 (six) hours as needed for headache or moderate pain.    Marland Kitchen lisinopril (PRINIVIL,ZESTRIL) 10 MG tablet Take 10 mg by mouth at bedtime.   6  . MELATONIN PO Take 10 mg by mouth at bedtime.     . metFORMIN (GLUCOPHAGE) 1000 MG tablet Take 1,000 mg by mouth 2 (two) times daily with a meal.    . metoprolol succinate (TOPROL-XL) 100 MG 24 hr tablet Take 100 mg by mouth every evening.     . Omega-3 Fatty Acids (FISH OIL PO) Take 1 capsule by mouth every evening. MEGA RED 500 MG    . rosuvastatin (CRESTOR) 40 MG tablet Take 20 mg by mouth 3 (three) times a week. Pt takes 1/2 tablet(44m) on Mondays, Wednesdays, Fridays.     No current facility-administered medications for this visit.    OBJECTIVE: Middle-aged white woman Who appears well Filed Vitals:   08/03/15 1248  BP: 123/48  Pulse: 65  Temp: 98.6 F (37 C)  Resp: 18  Body mass index is 30.1 kg/(m^2).    ECOG FS:0 - Asymptomatic  Sclerae unicteric, pupils round and equal Oropharynx clear and moist-- no thrush or other lesions No cervical or supraclavicular adenopathy Lungs no rales or rhonchi Heart regular rate and rhythm Abd soft, nontender, positive bowel sounds MSK no focal spinal tenderness, no upper extremity lymphedema Neuro: nonfocal, well oriented, appropriate affect Breasts: Right breast is status post lumpectomy and radiation. The cosmetic result is excellent. There is no evidence of disease recurrence. The right axilla is benign. Left breast is unremarkable. There are no 4   LAB RESULTS:  CMP     Component Value Date/Time   NA 139 07/27/2015 0821   NA 138 02/03/2015 0944   K 4.0 07/27/2015 0821   K 4.6 02/03/2015 0944   CL 103 02/03/2015 0944   CO2 24 07/27/2015 0821   CO2 25 02/03/2015 0944   GLUCOSE 103 07/27/2015 0821   GLUCOSE 124* 02/03/2015 0944   BUN 17.9  07/27/2015 0821   BUN 12 02/03/2015 0944   CREATININE 0.7 07/27/2015 0821   CREATININE 0.63 02/03/2015 0944   CALCIUM 9.3 07/27/2015 0821   CALCIUM 10.1 02/03/2015 0944   PROT 6.7 07/27/2015 0821   ALBUMIN 3.9 07/27/2015 0821   AST 16 07/27/2015 0821   ALT 13 07/27/2015 0821   ALKPHOS 56 07/27/2015 0821   BILITOT 0.49 07/27/2015 0821   GFRNONAA >60 02/03/2015 0944   GFRAA >60 02/03/2015 0944    INo results found for: SPEP, UPEP  Lab Results  Component Value Date   WBC 5.3 07/27/2015   NEUTROABS 4.0 07/27/2015   HGB 13.3 07/27/2015   HCT 38.5 07/27/2015   MCV 87.7 07/27/2015   PLT 169 07/27/2015      Chemistry      Component Value Date/Time   NA 139 07/27/2015 0821   NA 138 02/03/2015 0944   K 4.0 07/27/2015 0821   K 4.6 02/03/2015 0944   CL 103 02/03/2015 0944   CO2 24 07/27/2015 0821   CO2 25 02/03/2015 0944   BUN 17.9 07/27/2015 0821   BUN 12 02/03/2015 0944   CREATININE 0.7 07/27/2015 0821   CREATININE 0.63 02/03/2015 0944      Component Value Date/Time   CALCIUM 9.3 07/27/2015 0821   CALCIUM 10.1 02/03/2015 0944   ALKPHOS 56 07/27/2015 0821   AST 16 07/27/2015 0821   ALT 13 07/27/2015 0821   BILITOT 0.49 07/27/2015 0821       No results found for: LABCA2  No components found for: [id number]  No results for input(s): INR in the last 168 hours.  Urinalysis No results found for: COLORURINE, APPEARANCEUR, LABSPEC, PHURINE, GLUCOSEU, HGBUR, BILIRUBINUR, KETONESUR, PROTEINUR, UROBILINOGEN, NITRITE, LEUKOCYTESUR  STUDIES: Mm Diag Breast Tomo Bilateral  07/11/2015  CLINICAL DATA:  65 year old female presenting for routine postlumpectomy follow-up status post right breast lumpectomy in November of 2016. EXAM: 2D DIGITAL DIAGNOSTIC BILATERAL MAMMOGRAM WITH CAD AND ADJUNCT TOMO COMPARISON:  Previous exam(s). ACR Breast Density Category b: There are scattered areas of fibroglandular density. FINDINGS: Interval postlumpectomy changes in the right breast with  increased density and a small seroma. No suspicious calcifications, masses or areas of distortion are seen in the bilateral breasts. Mammographic images were processed with CAD. IMPRESSION: Postlumpectomy changes identified in the upper-outer right breast. No mammographic evidence of malignancy in the bilateral breasts. RECOMMENDATION: Diagnostic mammogram is suggested in 1 year. (Code:DM-B-01Y) I have discussed the findings and recommendations with the patient. Results were also provided  in writing at the conclusion of the visit. If applicable, a reminder letter will be sent to the patient regarding the next appointment. BI-RADS CATEGORY  2: Benign. Electronically Signed   By: Ammie Ferrier M.D.   On: 07/11/2015 10:03    ASSESSMENT: 65 y.o. [place] woman status post right breast upper outer quadrant biopsy 07/13/2014, for a clinical T3 N0, stage IIB invasive ductal carcinoma, grade 3, estrogen receptor positive, HER-2 and progesterone receptor negative, with an MIB-1 of 40%  (1) neoadjuvant chemotherapy starting 08/15/2013, consisting of doxorubicin and cyclophosphamide in dose dense fashion 4, completed 09/28/2014, followed by weekly paclitaxel 12 starting 10/19/2014, completed 01/04/2015  (2) status post right lumpectomy and sentinel lymph node sampling 02/11/2015 for a ypTis ypN0 ductal carcinoma in situ, with negative margins. This counts as a complete pathologic response  (3) adjuvant radiation completed 05/03/2015  (4) anastrozole started 05/25/2015  (a) bone density 04/05/2015 was normal with a T score of -0.9  PLAN: Garry is tolerating the anastrozole well. The plan will be to continue that for total of 5 years.  She will be volunteering here and had her TB test done but we cannot locate the results. Hopefully we can get that should not so she can get started. She is very enthusiastic in either.  I think the shoulder problem is either mild bursitis or early rotator cuff  problems. I gave her 2 exercises to do anastrozole not to do one exercise. Hopefully that will clear the problem.  I'm going to see her again in October. If all continues well at that time I will probably start seeing her once a year thereafter she knows to call for any problems that may develop before the next visit.  [name], MD   08/03/2015 12:58 PM

## 2015-12-23 ENCOUNTER — Other Ambulatory Visit: Payer: Self-pay | Admitting: *Deleted

## 2015-12-23 DIAGNOSIS — C50411 Malignant neoplasm of upper-outer quadrant of right female breast: Secondary | ICD-10-CM

## 2015-12-26 ENCOUNTER — Other Ambulatory Visit (HOSPITAL_BASED_OUTPATIENT_CLINIC_OR_DEPARTMENT_OTHER): Payer: 59

## 2015-12-26 DIAGNOSIS — C50411 Malignant neoplasm of upper-outer quadrant of right female breast: Secondary | ICD-10-CM | POA: Diagnosis not present

## 2015-12-26 LAB — COMPREHENSIVE METABOLIC PANEL
ALBUMIN: 3.8 g/dL (ref 3.5–5.0)
ALK PHOS: 70 U/L (ref 40–150)
ALT: 16 U/L (ref 0–55)
AST: 18 U/L (ref 5–34)
Anion Gap: 11 mEq/L (ref 3–11)
BILIRUBIN TOTAL: 0.67 mg/dL (ref 0.20–1.20)
BUN: 16.3 mg/dL (ref 7.0–26.0)
CO2: 25 mEq/L (ref 22–29)
CREATININE: 0.7 mg/dL (ref 0.6–1.1)
Calcium: 10 mg/dL (ref 8.4–10.4)
Chloride: 104 mEq/L (ref 98–109)
EGFR: 85 mL/min/{1.73_m2} — ABNORMAL LOW (ref >90)
GLUCOSE: 119 mg/dL (ref 70–140)
POTASSIUM: 5.1 meq/L (ref 3.5–5.1)
SODIUM: 139 meq/L (ref 136–145)
TOTAL PROTEIN: 7.1 g/dL (ref 6.4–8.3)

## 2015-12-26 LAB — CBC WITH DIFFERENTIAL/PLATELET
BASO%: 0.4 % (ref 0.0–2.0)
BASOS ABS: 0 10*3/uL (ref 0.0–0.1)
EOS ABS: 0.1 10*3/uL (ref 0.0–0.5)
EOS%: 1.5 % (ref 0.0–7.0)
HEMATOCRIT: 39.4 % (ref 34.8–46.6)
HGB: 13.6 g/dL (ref 11.6–15.9)
LYMPH#: 1 10*3/uL (ref 0.9–3.3)
LYMPH%: 17.1 % (ref 14.0–49.7)
MCH: 30.3 pg (ref 25.1–34.0)
MCHC: 34.4 g/dL (ref 31.5–36.0)
MCV: 88 fL (ref 79.5–101.0)
MONO#: 0.5 10*3/uL (ref 0.1–0.9)
MONO%: 8.7 % (ref 0.0–14.0)
NEUT#: 4.2 10*3/uL (ref 1.5–6.5)
NEUT%: 72.3 % (ref 38.4–76.8)
PLATELETS: 212 10*3/uL (ref 145–400)
RBC: 4.48 10*6/uL (ref 3.70–5.45)
RDW: 13.8 % (ref 11.2–14.5)
WBC: 5.8 10*3/uL (ref 3.9–10.3)

## 2016-01-02 ENCOUNTER — Ambulatory Visit (HOSPITAL_BASED_OUTPATIENT_CLINIC_OR_DEPARTMENT_OTHER): Payer: 59 | Admitting: Oncology

## 2016-01-02 VITALS — BP 122/55 | HR 65 | Temp 98.7°F | Resp 18 | Ht 61.0 in | Wt 160.7 lb

## 2016-01-02 DIAGNOSIS — Z79811 Long term (current) use of aromatase inhibitors: Secondary | ICD-10-CM | POA: Diagnosis not present

## 2016-01-02 DIAGNOSIS — C50411 Malignant neoplasm of upper-outer quadrant of right female breast: Secondary | ICD-10-CM

## 2016-01-02 DIAGNOSIS — Z170 Estrogen receptor positive status [ER+]: Secondary | ICD-10-CM | POA: Diagnosis not present

## 2016-01-02 NOTE — Progress Notes (Signed)
[facility]  Telephone:(336) [phone] Fax:(336) [phone]     ID: Darlene Kelly DOB: 10-25-1950  MR#: [id number]  CBJ#:[id number]  Patient Care Team: Darlene Shire, MD as PCP - General (Family Medicine) Darlene Seltzer, MD as Consulting Physician (General Surgery) Darlene Cruel, MD as Consulting Physician (Oncology) Darlene Silversmith, MD as Consulting Physician (Radiation Oncology) Darlene Kaufmann, RN as Registered Nurse Darlene Germany, RN as Registered Nurse Darlene Hick, MD as Referring Physician (Cardiology) Darlene Dopp, MD as Referring Physician (Dermatology) Darlene Bouche, NP as Nurse Practitioner (Nurse Practitioner) Darlene Cheese, NP as Nurse Practitioner (Hematology and Oncology) PCP: Darlene Shire, MD OTHER MD:  CHIEF COMPLAINT: Estrogen receptor positive breast cancer  CURRENT TREATMENT: Anastrozole  BREAST CANCER HISTORY: From the original intake note:  Darlene Kelly first noted a change in her right breast September 2015. At that time however she was consumed by the care of her mother-in-law, who had severe Alzheimer's disease. She finally passed in January 2016, but Darlene Kelly did not share the information regarding the right breast with her husband until late March 2016. At that point she was set up for bilateral diagnostic mammography with tomography at the breast Center, for 11/20/2014. This found the breast density to be category B. In the upper outer quadrant of the right breast there was a 4 cm irregular spiculated mass. There were no other findings of concern. The mass was palpable and firm, and by ultrasound measured 3.3 cm. The right axilla was negative sonographically.  Biopsy of the right breast mass in question was obtained for 03/22/2015. 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  On 07/20/2014 the patient underwent bilateral breast MRI. This measured the large irregular enhancing mass in the right breast at the 10:00 position at 5.0 cm. There were no additional worrisome findings in the right breast, and no findings in the left breast or any abnormal appearing lymph nodes.  The patient's subsequent history is as detailed below  INTERVAL HISTORY: [name] returns today for follow-up of her breast cancer accompanied by her husband Darlene Kelly. She tolerates anastrozole remarkably well. She doesn't have hot flashes and vaginal dryness is not a major issue the obtain at a very good price  REVIEW OF SYSTEMS: She has a little cough in the morning right after waking up, 45 times, which does not recur during the rest of the day. It is dry and nonproductive. She has no shortness of breath or pleurisy problems. She hasn't had any fever. Her sense of taste is back which she doesn't really crave any particular foods or have any unusual longings for food. This is different from before. She is doing a lot of volunteering here and really enjoying it. She gets more than 10,000 steps a day doing that. A detailed review of systems today was otherwise stable.  PAST MEDICAL HISTORY: Past Medical History:  Diagnosis Date  . Breast cancer of upper-outer quadrant of right female breast ([facility]) 07/16/2014  . Diabetes mellitus without complication ([facility])   . Heart murmur   . Hypertension   . Skin cancer     PAST SURGICAL HISTORY: Past Surgical History:  Procedure Laterality Date  . PORT-A-CATH REMOVAL Left 02/11/2015   Procedure: REMOVAL PORT-A-CATH;  Surgeon: Darlene Seltzer, MD;  Location: [facility];  Service: General;  Laterality: Left;  . PORTACATH PLACEMENT N/A 08/03/2014  Procedure: INSERTION PORT-A-CATH;  Surgeon: Darlene Seltzer, MD;  Location: [facility];  Service: General;  Laterality: N/A;  . RADIOACTIVE SEED GUIDED MASTECTOMY WITH AXILLARY SENTINEL LYMPH NODE BIOPSY Right 02/11/2015    Procedure: RIGHT RADIOACTIVE SEED LOCALIZATION LUMPECTOMY  WITH  RIGHT AXILLARY SENTINEL LYMPH NODE BIOPSY;  Surgeon: Darlene Seltzer, MD;  Location: [facility];  Service: General;  Laterality: Right;    FAMILY HISTORY Family History  Problem Relation Age of Onset  . Lung cancer Father    the patient's father died from lung cancer in the setting of tobacco abuse at the age of 36. The patient's mother died from COPD at the age of 59. The patient has 3 brothers, no sisters. There is no history of breast or ovarian cancer in the family.  GYNECOLOGIC HISTORY:  No LMP recorded. Patient is postmenopausal. Menarche age 64, first live birth age 44. She is GX P3. She went through the change of life in her late 28s. She did not take hormone replacement.  SOCIAL HISTORY:  Darlene Kelly worked for [employer], but is now retired. Her husband Darlene Kelly is Programmer, systems for a [employer]. They have been married more than 40 years. Son Darlene Kelly  "Darlene Kelly" Darlene Kelly is a English as a second language teacher in [place]. Son Darlene Kelly is a Dealer in [place]. Darlene Kelly is a Pharmacologist for [employer] in [place]. The patient has 3 grandchildren.    ADVANCED DIRECTIVES: In place   HEALTH MAINTENANCE: Social History  Substance Use Topics  . Smoking status: Former Smoker    Packs/day: 0.50    Years: 12.00    Types: Cigarettes    Quit date: 04/02/1986  . Smokeless tobacco: Never Used  . Alcohol use 1.8 oz/week    3 Glasses of wine per week     Colonoscopy: Never  PAP:  Bone density: Never  Lipid panel:  Allergies  Allergen Reactions  . Latex Itching, Dermatitis and Rash  . Lipitor [Atorvastatin] Other (See Comments)    Bad leg cramps    Current Outpatient Prescriptions  Medication Sig Dispense Refill  . anastrozole (ARIMIDEX) 1 MG tablet Take 1 tablet (1 mg total) by mouth daily. 90 tablet 4  . aspirin EC 81 MG tablet Take 81 mg by mouth at bedtime.     .  cetirizine (ZYRTEC) 10 MG tablet Take 10 mg by mouth daily as needed for allergies.    . Cholecalciferol (VITAMIN D) 2000 UNITS CAPS Take 1 capsule by mouth daily.    Marland Kitchen ibuprofen (ADVIL,MOTRIN) 200 MG tablet Take 400 mg by mouth every 6 (six) hours as needed for headache or moderate pain.    Marland Kitchen lisinopril (PRINIVIL,ZESTRIL) 10 MG tablet Take 10 mg by mouth at bedtime.   6  . MELATONIN PO Take 10 mg by mouth at bedtime.     . metFORMIN (GLUCOPHAGE) 1000 MG tablet Take 1,000 mg by mouth 2 (two) times daily with a meal.    . metoprolol succinate (TOPROL-XL) 100 MG 24 hr tablet Take 100 mg by mouth every evening.     . Omega-3 Fatty Acids (FISH OIL PO) Take 1 capsule by mouth every evening. MEGA RED 500 MG    . rosuvastatin (CRESTOR) 40 MG tablet Take 20 mg by mouth 3 (three) times a week. Pt takes 1/2 tablet('20mg'$ ) on Mondays, Wednesdays, Fridays.     No current facility-administered medications for this visit.     OBJECTIVE: Middle-aged white woman In no acute distress Vitals:  01/02/16 1303  BP: (!) 122/55  Pulse: 65  Resp: 18  Temp: 98.7 F (37.1 C)     Body mass index is 30.36 kg/m.    ECOG FS:0 - Asymptomatic  Sclerae unicteric, EOMs intact Oropharynx clear and moist No cervical or supraclavicular adenopathy Lungs no rales or rhonchi Heart regular rate and rhythm Abd soft, nontender, positive bowel sounds MSK no focal spinal tenderness, no upper extremity lymphedema Neuro: nonfocal, well oriented, appropriate affect Breasts: The right breast is status post lumpectomy followed by radiation. There is no evidence of local recurrence. The right axilla is benign. The left breast is unremarkable.   LAB RESULTS:  CMP     Component Value Date/Time   NA 139 12/26/2015 0754   K 5.1 12/26/2015 0754   CL 103 02/03/2015 0944   CO2 25 12/26/2015 0754   GLUCOSE 119 12/26/2015 0754   BUN 16.3 12/26/2015 0754   CREATININE 0.7 12/26/2015 0754   CALCIUM 10.0 12/26/2015 0754   PROT 7.1  12/26/2015 0754   ALBUMIN 3.8 12/26/2015 0754   AST 18 12/26/2015 0754   ALT 16 12/26/2015 0754   ALKPHOS 70 12/26/2015 0754   BILITOT 0.67 12/26/2015 0754   GFRNONAA >60 02/03/2015 0944   GFRAA >60 02/03/2015 0944    INo results found for: SPEP, UPEP  Lab Results  Component Value Date   WBC 5.8 12/26/2015   NEUTROABS 4.2 12/26/2015   HGB 13.6 12/26/2015   HCT 39.4 12/26/2015   MCV 88.0 12/26/2015   PLT 212 12/26/2015      Chemistry      Component Value Date/Time   NA 139 12/26/2015 0754   K 5.1 12/26/2015 0754   CL 103 02/03/2015 0944   CO2 25 12/26/2015 0754   BUN 16.3 12/26/2015 0754   CREATININE 0.7 12/26/2015 0754      Component Value Date/Time   CALCIUM 10.0 12/26/2015 0754   ALKPHOS 70 12/26/2015 0754   AST 18 12/26/2015 0754   ALT 16 12/26/2015 0754   BILITOT 0.67 12/26/2015 0754       No results found for: LABCA2  No components found for: [id number]  No results for input(s): INR in the last 168 hours.  Urinalysis No results found for: COLORURINE, APPEARANCEUR, LABSPEC, PHURINE, GLUCOSEU, HGBUR, BILIRUBINUR, KETONESUR, PROTEINUR, UROBILINOGEN, NITRITE, LEUKOCYTESUR  STUDIES: CLINICAL DATA:  65 year old female presenting for routine postlumpectomy follow-up status post right breast lumpectomy in November of 2016.  EXAM: 2D DIGITAL DIAGNOSTIC BILATERAL MAMMOGRAM WITH CAD AND ADJUNCT TOMO  COMPARISON:  Previous exam(s).  ACR Breast Density Category b: There are scattered areas of fibroglandular density.  FINDINGS: Interval postlumpectomy changes in the right breast with increased density and a small seroma. No suspicious calcifications, masses or areas of distortion are seen in the bilateral breasts.  Mammographic images were processed with CAD.  IMPRESSION: Postlumpectomy changes identified in the upper-outer right breast. No mammographic evidence of malignancy in the bilateral breasts.  RECOMMENDATION: Diagnostic mammogram is  suggested in 1 year. (Code:DM-B-01Y)  I have discussed the findings and recommendations with the patient. Results were also provided in writing at the conclusion of the visit. If applicable, a reminder letter will be sent to the patient regarding the next appointment.  BI-RADS CATEGORY  2: Benign.   Electronically Signed   By: Ammie Ferrier M.D.   On: 07/11/2015 10:03 ASSESSMENT: 65 y.o. [place] woman status post right breast upper outer quadrant biopsy 07/13/2014, for a clinical T3 N0, stage IIB invasive ductal carcinoma,  grade 3, estrogen receptor positive, HER-2 and progesterone receptor negative, with an MIB-1 of 40%  (1) neoadjuvant chemotherapy starting 08/15/2013, consisting of doxorubicin and cyclophosphamide in dose dense fashion 4, completed 09/28/2014, followed by weekly paclitaxel 12 starting 10/19/2014, completed 01/04/2015  (2) status post right lumpectomy and sentinel lymph node sampling 02/11/2015 for a ypTis ypN0 ductal carcinoma in situ, with negative margins. This counts as a complete pathologic response  (3) adjuvant radiation completed 05/03/2015  (4) anastrozole started 05/25/2015  (a) bone density 04/05/2015 was normal with a T score of -0.9  PLAN: Darlene Kelly is now nearly a year out from definitive surgery for her breast cancer with no evidence of recurrence. This is favorable.  She is tolerating the anastrozole well. I do think she has a little bit of vaginal dryness concern and I gave her the information on our intimacy and pelvic health class.  She is getting any normals him out of exercise here. She does have some stiffness. I think that is not going to be due to the anastrozole but rather to her year of birth. Nevertheless I suggested she consider our tai chi class on Wednesday mornings. She could simply do that and then proceed directly to volunteering at she has been doing  I think her cough may be related to the lisinopril or it could possibly  be reflux. At this point it is so minimal she doesn't want to work it up any further.  She will see Dr. Excell Kelly in about 6 months, after her next mammogram. She will see me again in a year. She knows to call for any problems that may develop before then. Darlene Cruel, MD   01/02/2016 1:32 PM

## 2016-05-31 ENCOUNTER — Other Ambulatory Visit: Payer: Self-pay | Admitting: Oncology

## 2016-05-31 ENCOUNTER — Other Ambulatory Visit: Payer: Self-pay

## 2016-05-31 DIAGNOSIS — Z853 Personal history of malignant neoplasm of breast: Secondary | ICD-10-CM

## 2016-07-17 ENCOUNTER — Ambulatory Visit
Admission: RE | Admit: 2016-07-17 | Discharge: 2016-07-17 | Disposition: A | Discharge status: Home / Self Care | Payer: Medicare Other | Source: Ambulatory Visit | Attending: Oncology | Admitting: Oncology

## 2016-07-17 DIAGNOSIS — Z853 Personal history of malignant neoplasm of breast: Secondary | ICD-10-CM

## 2016-07-17 HISTORY — DX: Personal history of antineoplastic chemotherapy: Z92.21

## 2016-07-17 HISTORY — DX: Personal history of irradiation: Z92.3

## 2016-07-17 HISTORY — DX: Malignant neoplasm of unspecified site of unspecified female breast: C50.919

## 2016-10-16 ENCOUNTER — Telehealth: Payer: Self-pay

## 2016-10-16 NOTE — Telephone Encounter (Signed)
Spoke with patient and she is aware of her new appts due to dr gm out of the office  Darlene Kelly

## 2016-10-17 ENCOUNTER — Other Ambulatory Visit: Payer: Self-pay | Admitting: Emergency Medicine

## 2016-10-17 MED ORDER — ANASTROZOLE 1 MG PO TABS
1.0000 mg | ORAL_TABLET | Freq: Every day | ORAL | 4 refills | Status: DC
Start: 2016-10-17 — End: 2017-01-28

## 2016-11-07 IMAGING — MR MR BREAST BILATERAL W WO CONTRAST
6 of 13 series · 24 of 48 positions shown · IV contrast (multihance)
Comparison: MRI of the breast 07/20/2014

CLINICAL DATA: Right breast invasive mammary carcinoma diagnosed in
July 2014. The patient is undergoing neoadjuvant chemotherapy.

LABS:  None
EXAM:
BILATERAL BREAST MRI WITH AND WITHOUT CONTRAST
TECHNIQUE: Multiplanar, multisequence MR images of both breasts were obtained
prior to and following the intravenous administration of 15 ml of
MultiHance.

[Series 2: T2 · axial · 3.0mm · 0.47mm/px · z∈[-112,+56]mm · 3 of 57 slices shown]
[im 1/57]
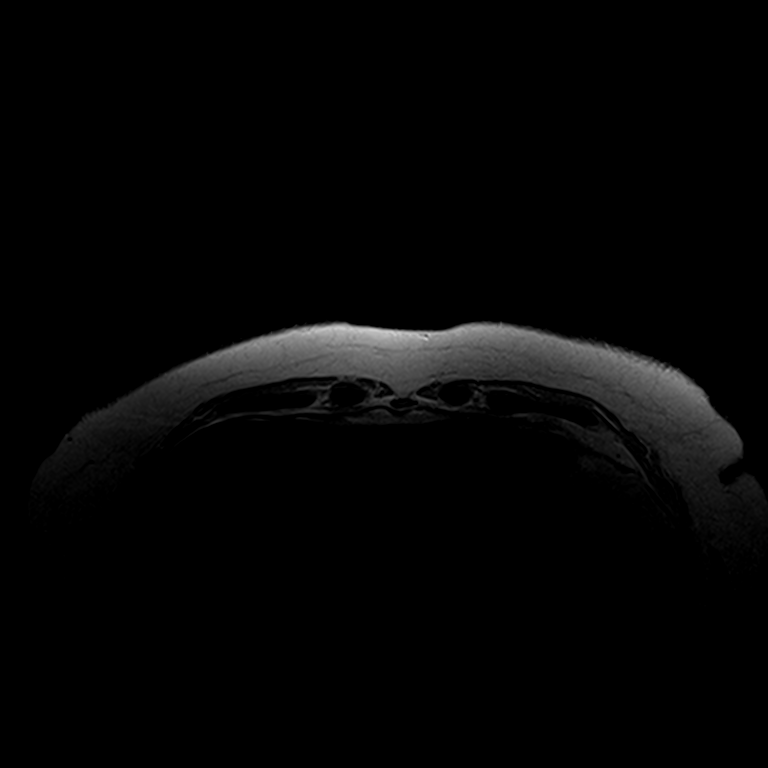
[im 29/57]
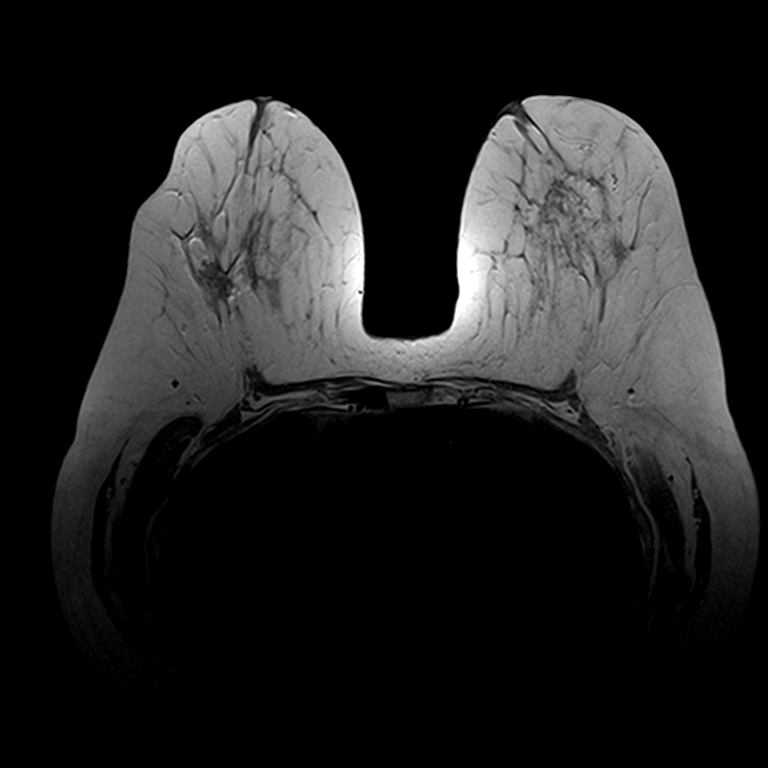
[im 57/57]
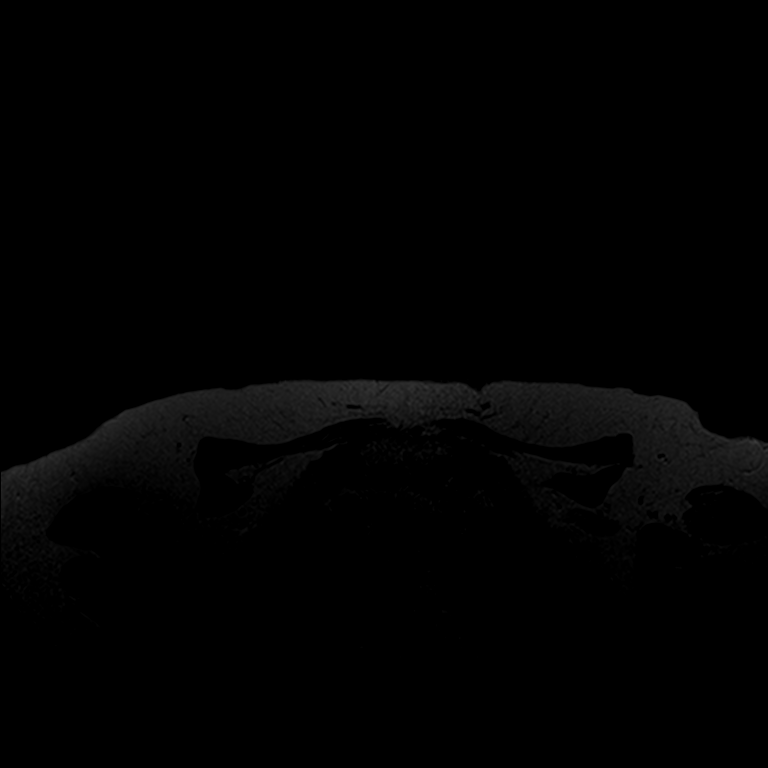

[Series 3: t2_tirm_tra ipat (a-p) · axial · 3.0mm · 0.70mm/px · z∈[-112,+56]mm · 2 of 57 slices shown]
[im 1/57]
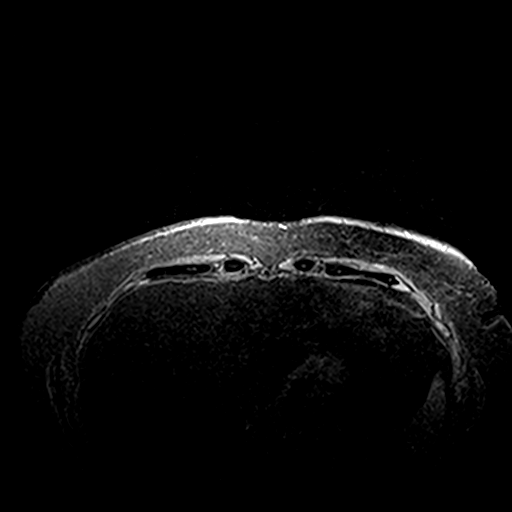
[im 57/57]
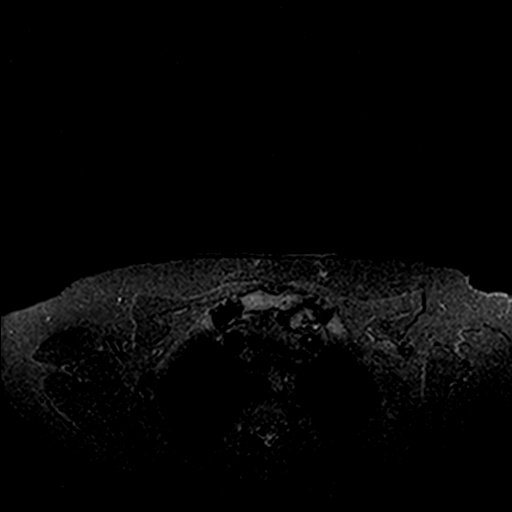

[Series 4: fl3d pre-cm no · axial · non-contrast · 1.2mm · 0.94mm/px · z∈[-123,+67]mm · 5 of 160 slices shown]
[im 1/160]
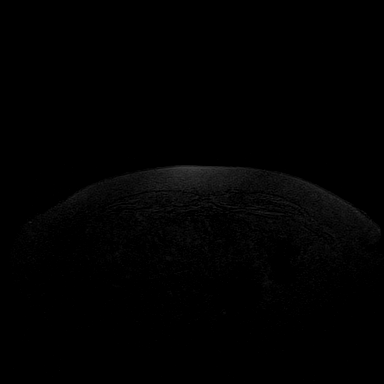
[im 40/160]
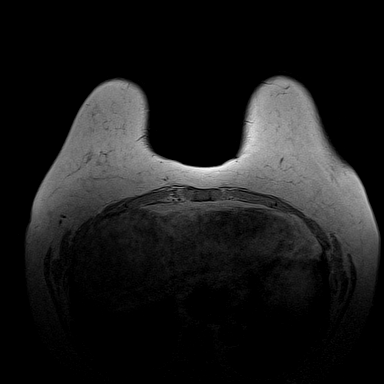
[im 80/160]
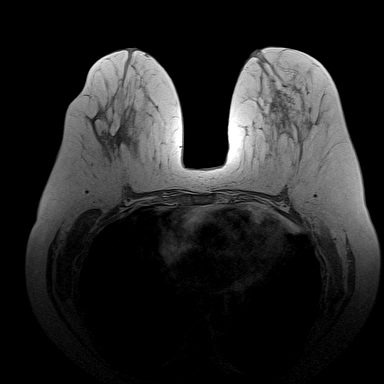
[im 120/160]
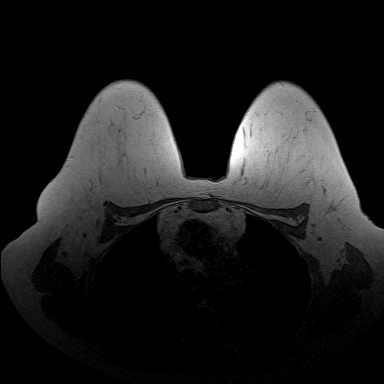
[im 160/160]
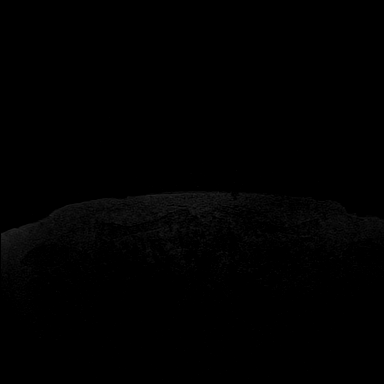

[Series 5: fl3d pre-cm · axial · non-contrast · 1.2mm · 0.94mm/px · z∈[-123,+67]mm · 5 of 160 slices shown]
[im 1/160]
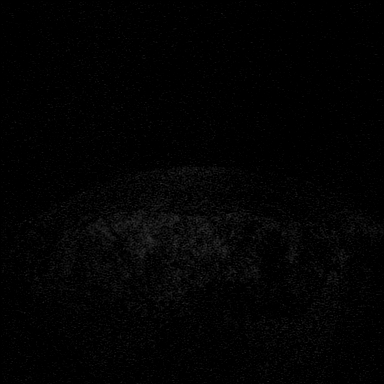
[im 40/160]
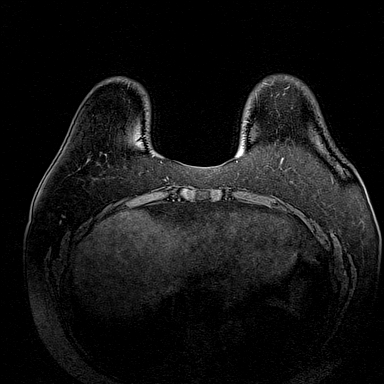
[im 80/160]
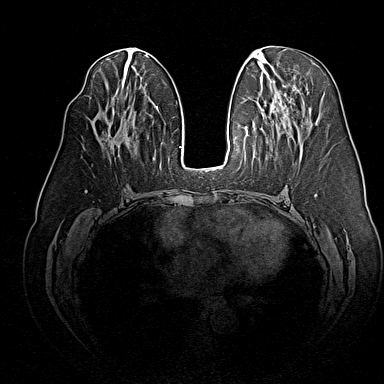
[im 120/160]
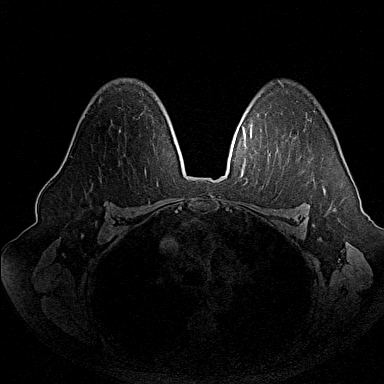
[im 160/160]
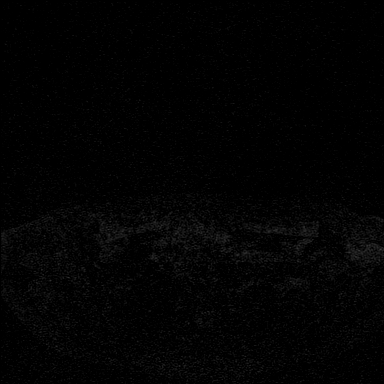

[Series 6: fl3d post immediate · axial · 1.2mm · 0.94mm/px · z∈[-123,+67]mm · 5 of 160 slices shown (1 of 2)]
[im 1/160]
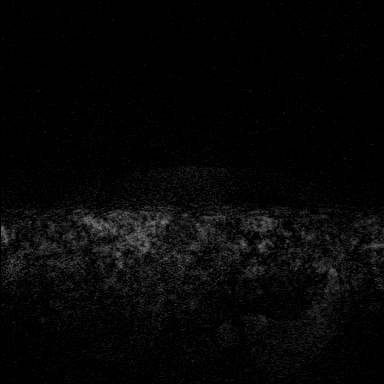
[im 40/160]
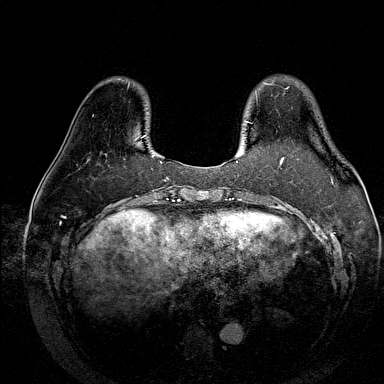
[im 80/160]
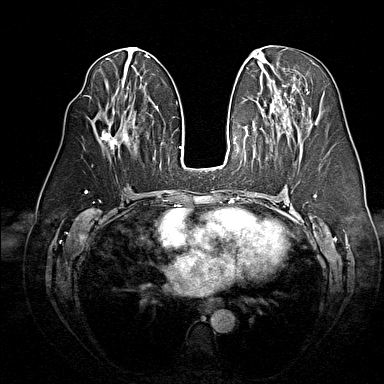
[im 120/160]
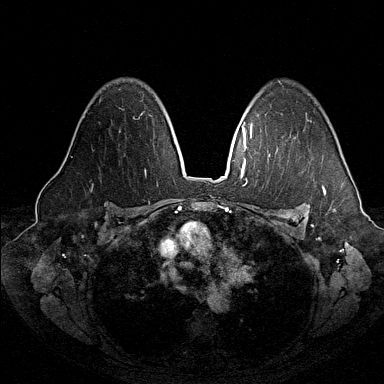
[im 160/160]
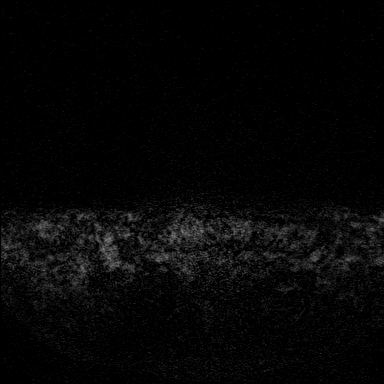

[Series 7: fl3d post immediate · axial · 1.2mm · 0.94mm/px · z∈[-123,+19]mm · 4 of 160 slices shown (2 of 2)]
[im 1/160]
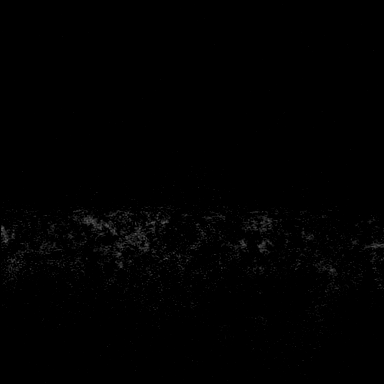
[im 40/160]
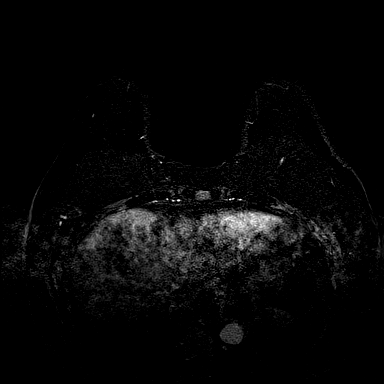
[im 80/160]
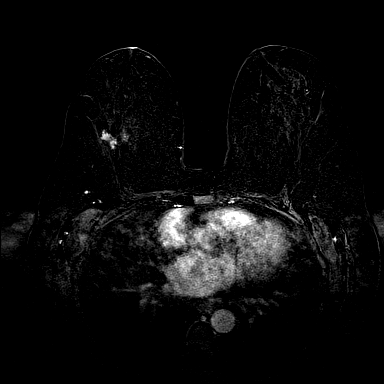
[im 120/160]
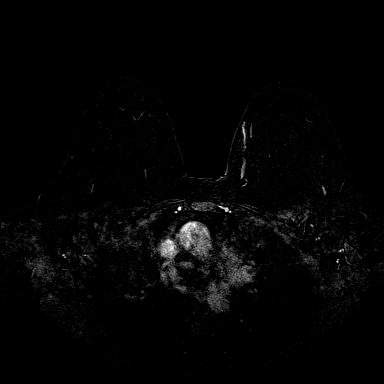

[24 of 48 positions shown; findings below may reference images not displayed]

THREE-DIMENSIONAL MR IMAGE RENDERING ON INDEPENDENT WORKSTATION:

Three-dimensional MR images were rendered by post-processing of the
original MR data on an independent workstation. The
three-dimensional MR images were interpreted, and findings are
reported in the following complete MRI report for this study. Three
dimensional images were evaluated at the independent [employer]
workstation
FINDINGS: Breast composition: b. Scattered fibroglandular tissue.

Background parenchymal enhancement: Mild.

Right breast: The previously demonstrated right breast malignancy at
11 o'clock, middle to posterior depth, has decreased in size and
measures 2.6 by 2.7 by 2.7 cm. It demonstrates mostly peripheral
mixed progressive and rapid wash-in and washout enhancement, and
probable central necrosis with most of the viable tumor located
superiorly and laterally.

Left breast: No mass or abnormal enhancement.

Lymph nodes: No abnormal appearing lymph nodes.

Ancillary findings:  None.
IMPRESSION: Significant response to chemotherapy with decreased in the size of
the necrotic right breast mass.

No evidence of lymphadenopathy by MRI.

RECOMMENDATION:
Continued medical and surgical follow-up per treatment plan.

BI-RADS CATEGORY  6: Known biopsy-proven malignancy.

## 2016-12-25 ENCOUNTER — Other Ambulatory Visit: Payer: 59

## 2016-12-31 ENCOUNTER — Other Ambulatory Visit: Payer: Self-pay | Admitting: *Deleted

## 2016-12-31 DIAGNOSIS — Z170 Estrogen receptor positive status [ER+]: Principal | ICD-10-CM

## 2016-12-31 DIAGNOSIS — C50411 Malignant neoplasm of upper-outer quadrant of right female breast: Secondary | ICD-10-CM

## 2016-12-31 MED ORDER — LETROZOLE 2.5 MG PO TABS: 2.5000 mg | ORAL_TABLET | Freq: Every day | ORAL | 3 refills | Status: DC

## 2016-12-31 NOTE — Telephone Encounter (Signed)
Spoke with patient concerning her joint pain from her anastrozole. She stopped about 3 weeks ago.  Her pain is much better.  Informed her we would try letrozole.  Rx called into her pharmacy.  She will let us know how this work.

## 2017-01-01 ENCOUNTER — Ambulatory Visit: Payer: 59 | Admitting: Oncology

## 2017-01-01 ENCOUNTER — Other Ambulatory Visit: Payer: 59

## 2017-01-21 ENCOUNTER — Other Ambulatory Visit (HOSPITAL_BASED_OUTPATIENT_CLINIC_OR_DEPARTMENT_OTHER): Payer: Medicare Other

## 2017-01-21 DIAGNOSIS — Z170 Estrogen receptor positive status [ER+]: Principal | ICD-10-CM

## 2017-01-21 DIAGNOSIS — C50411 Malignant neoplasm of upper-outer quadrant of right female breast: Secondary | ICD-10-CM

## 2017-01-21 LAB — CBC WITH DIFFERENTIAL/PLATELET
BASO%: 0.2 % (ref 0.0–2.0)
BASOS ABS: 0 10*3/uL (ref 0.0–0.1)
EOS ABS: 0 10*3/uL (ref 0.0–0.5)
EOS%: 0.5 % (ref 0.0–7.0)
HEMATOCRIT: 41.5 % (ref 34.8–46.6)
HGB: 14.2 g/dL (ref 11.6–15.9)
LYMPH%: 21.6 % (ref 14.0–49.7)
MCH: 30.8 pg (ref 25.1–34.0)
MCHC: 34.2 g/dL (ref 31.5–36.0)
MCV: 90 fL (ref 79.5–101.0)
MONO#: 0.6 10*3/uL (ref 0.1–0.9)
MONO%: 10.5 % (ref 0.0–14.0)
NEUT#: 3.7 10*3/uL (ref 1.5–6.5)
NEUT%: 67.2 % (ref 38.4–76.8)
PLATELETS: 197 10*3/uL (ref 145–400)
RBC: 4.61 10*6/uL (ref 3.70–5.45)
RDW: 13.4 % (ref 11.2–14.5)
WBC: 5.5 10*3/uL (ref 3.9–10.3)
lymph#: 1.2 10*3/uL (ref 0.9–3.3)

## 2017-01-21 LAB — COMPREHENSIVE METABOLIC PANEL
ALT: 21 U/L (ref 0–55)
AST: 24 U/L (ref 5–34)
Albumin: 4.1 g/dL (ref 3.5–5.0)
Alkaline Phosphatase: 73 U/L (ref 40–150)
Anion Gap: 9 mEq/L (ref 3–11)
BILIRUBIN TOTAL: 0.68 mg/dL (ref 0.20–1.20)
BUN: 18.7 mg/dL (ref 7.0–26.0)
CALCIUM: 10.3 mg/dL (ref 8.4–10.4)
CHLORIDE: 103 meq/L (ref 98–109)
CO2: 26 meq/L (ref 22–29)
CREATININE: 0.8 mg/dL (ref 0.6–1.1)
EGFR: >60 mL/min/{1.73_m2} (ref >60)
Glucose: 113 mg/dl (ref 70–140)
Potassium: 4.9 mEq/L (ref 3.5–5.1)
Sodium: 139 mEq/L (ref 136–145)
TOTAL PROTEIN: 7.4 g/dL (ref 6.4–8.3)

## 2017-01-27 NOTE — Progress Notes (Signed)
[facility]  Telephone:([phone]) [phone] Fax:(336) [phone]     ID: Darlene Kelly DOB: 01-Oct-1950  MR#: [id number]  UXN#:[id number]  Patient Care Team: [name], MD as PCP - General (Family Medicine) [name], MD as Consulting Physician (General Surgery) Sheika Coutts, [name], MD as Consulting Physician (Oncology) [name], MD (Inactive) as Consulting Physician (Radiation Oncology) [name], RN as Registered Nurse [name], RN as Registered Nurse [name], [name], MD as Referring Physician (Cardiology) [name], Jennefer Bravo, MD as Referring Physician (Dermatology) [name], NP as Nurse Practitioner (Nurse Practitioner) [name], NP as Nurse Practitioner (Hematology and Oncology) PCP: [name], MD OTHER MD:  CHIEF COMPLAINT: Estrogen receptor positive breast cancer  CURRENT TREATMENT: Anastrozole  BREAST CANCER HISTORY: From the original intake note:  Darlene Kelly first noted a change in her right breast September 2015. At that time however she was consumed by the care of her mother-in-law, who had severe Alzheimer's disease. She finally passed in January 2016, but Darlene Kelly did not share the information regarding the right breast with her husband until late March 2016. At that point she was set up for bilateral diagnostic mammography with tomography at the breast Center, for 11/20/2014. This found the breast density to be category B. In the upper outer quadrant of the right breast there was a 4 cm irregular spiculated mass. There were no other findings of concern. The mass was palpable and firm, and by ultrasound measured 3.3 cm. The right axilla was negative sonographically.  Biopsy of the right breast mass in question was obtained for 03/22/2015. This showed (SAA [phone]) and invasive ductal carcinoma, grade 3, estrogen receptor 50% positive, with moderate staining intensity, progesterone receptor and HER-2  negative, with an MIB-1 of 40%.  On 07/20/2014 the patient underwent bilateral breast MRI. This measured the large irregular enhancing mass in the right breast at the 10:00 position at 5.0 cm. There were no additional worrisome findings in the right breast, and no findings in the left breast or any abnormal appearing lymph nodes.  The patient's subsequent history is as detailed below  INTERVAL HISTORY: Darlene Kelly returns today for follow-up and treatment of her estrogen receptor positive breast cancer. She continues on letrozole, with good tolerance. She denies hot flashes or vaginal dryness. --She was recently switched from anastrozole to letrozole due to increased muscle pain and joint pain. She notes that in the 2 week period of being off of anastrozole before start of letrozole, she had decreased BUE/BLE muscle pain and joint pain. Following the start of letrozole, she is now having residual muscle and joint pain.   Since her last visit to the office, she has had a diagnostic bilateral mammogram with tomography completed at [facility] on 07/17/2016 with results showing: No mammographic evidence of breast malignancy.    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 She reports that she has had a lateral upper extremity myalgias and joint pain. She reports lower back pain. She denies unusual headaches, visual changes, nausea, vomiting, or dizziness. There has been no unusual cough, phlegm production, or pleurisy. This been no change in bowel or bladder habits. She denies unexplained fatigue or unexplained weight loss, bleeding, rash, or fever. A detailed review of systems was otherwise stable.     PAST MEDICAL HISTORY: Past Medical History:  Diagnosis Date  . Breast cancer ([facility])   . Breast cancer of upper-outer  quadrant of right female breast ([facility]) 07/16/2014  . Diabetes mellitus without complication ([facility])   . Heart murmur   .  Hypertension   . Personal history of chemotherapy 2016  . Personal history of radiation therapy   . Skin cancer     PAST SURGICAL HISTORY: Past Surgical History:  Procedure Laterality Date  . BREAST BIOPSY Right 07/13/2014   malignant  . BREAST LUMPECTOMY Right 02/11/2015   malignant  . PORT-A-CATH REMOVAL Left 02/11/2015   Procedure: REMOVAL PORT-A-CATH;  Surgeon: [name], MD;  Location: [facility];  Service: General;  Laterality: Left;  . PORTACATH PLACEMENT N/A 08/03/2014   Procedure: INSERTION PORT-A-CATH;  Surgeon: [name], MD;  Location: [facility];  Service: General;  Laterality: N/A;  . RADIOACTIVE SEED GUIDED MASTECTOMY WITH AXILLARY SENTINEL LYMPH NODE BIOPSY Right 02/11/2015   Procedure: RIGHT RADIOACTIVE SEED LOCALIZATION LUMPECTOMY  WITH  RIGHT AXILLARY SENTINEL LYMPH NODE BIOPSY;  Surgeon: [name], MD;  Location: [facility];  Service: General;  Laterality: Right;    FAMILY HISTORY Family History  Problem Relation Age of Onset  . Lung cancer Father    the patient's father died from lung cancer in the setting of tobacco abuse at the age of 70. The patient's mother died from COPD at the age of 89. The patient has 3 brothers, no sisters. There is no history of breast or ovarian cancer in the family.  GYNECOLOGIC HISTORY:  No LMP recorded. Patient is postmenopausal. Menarche age 20, first live birth age 89. She is GX P3. She went through the change of life in her late 8s. She did not take hormone replacement.  SOCIAL HISTORY:  Darlene Kelly worked for [employer], but is now retired. Her husband Darlene Kelly is Programmer, systems for a [employer]. They have been married more than 40 years. Son Darlene Kelly is a English as a second language teacher in [place]. Son Darlene Kelly is a Dealer in [place]. [name] is a Pharmacologist for [employer] in [place]. The patient has 3 grandchildren.    ADVANCED DIRECTIVES: In  place   HEALTH MAINTENANCE: Social History  Substance Use Topics  . Smoking status: Former Smoker    Packs/day: 0.50    Years: 12.00    Types: Cigarettes    Quit date: 04/02/1986  . Smokeless tobacco: Never Used  . Alcohol use 1.8 oz/week    3 Glasses of wine per week     Colonoscopy: Never  PAP:  Bone density: Never  Lipid panel:  Allergies  Allergen Reactions  . Latex Itching, Dermatitis and Rash  . Lipitor [Atorvastatin] Other (See Comments)    Bad leg cramps    Current Outpatient Prescriptions  Medication Sig Dispense Refill  . anastrozole (ARIMIDEX) 1 MG tablet Take 1 tablet (1 mg total) by mouth daily. 90 tablet 4  . aspirin EC 81 MG tablet Take 81 mg by mouth at bedtime.     . cetirizine (ZYRTEC) 10 MG tablet Take 10 mg by mouth daily as needed for allergies.    . Cholecalciferol (VITAMIN D) 2000 UNITS CAPS Take 1 capsule by mouth daily.    Marland Kitchen ibuprofen (ADVIL,MOTRIN) 200 MG tablet Take 400 mg by mouth every 6 (six) hours as needed for headache or moderate pain.    Marland Kitchen letrozole (FEMARA) 2.5 MG tablet Take 1 tablet (2.5 mg total) by mouth daily. 30 tablet 3  . lisinopril (PRINIVIL,ZESTRIL) 10 MG tablet Take 10 mg by mouth at bedtime.  6  . MELATONIN PO Take 10 mg by mouth at bedtime.     . metFORMIN (GLUCOPHAGE) 1000 MG tablet Take 1,000 mg by mouth 2 (two) times daily with a meal.    . metoprolol succinate (TOPROL-XL) 100 MG 24 hr tablet Take 100 mg by mouth every evening.     . Omega-3 Fatty Acids (FISH OIL PO) Take 1 capsule by mouth every evening. MEGA RED 500 MG    . rosuvastatin (CRESTOR) 40 MG tablet Take 20 mg by mouth 3 (three) times a week. Pt takes 1/2 tablet('20mg'$ ) on Mondays, Wednesdays, Fridays.     No current facility-administered medications for this visit.     OBJECTIVE: Middle-aged white woman who appears stated age 17:   01/28/17 1059  BP: (!) 166/76  Pulse: (!) 58  Resp: 18  Temp: 97.8 F (36.6 C)  SpO2: 98%     Body mass index is 33.05  kg/m.    ECOG FS:1 - Symptomatic but completely ambulatory  Sclerae unicteric, pupils round and equal Oropharynx clear and moist No cervical or supraclavicular adenopathy Lungs no rales or rhonchi Heart regular rate and rhythm Abd soft, nontender, positive bowel sounds MSK no focal spinal tenderness, no upper extremity lymphedema Neuro: nonfocal, well oriented, appropriate affect Breasts that is post right lumpectomy followed by radiation, with no evidence of local recurrence.  Left breast is unremarkable.  Both axillae are benign.   LAB RESULTS:  CMP     Component Value Date/Time   NA 139 01/21/2017 1142   K 4.9 01/21/2017 1142   CL 103 02/03/2015 0944   CO2 26 01/21/2017 1142   GLUCOSE 113 01/21/2017 1142   BUN 18.7 01/21/2017 1142   CREATININE 0.8 01/21/2017 1142   CALCIUM 10.3 01/21/2017 1142   PROT 7.4 01/21/2017 1142   ALBUMIN 4.1 01/21/2017 1142   AST 24 01/21/2017 1142   ALT 21 01/21/2017 1142   ALKPHOS 73 01/21/2017 1142   BILITOT 0.68 01/21/2017 1142   GFRNONAA >60 02/03/2015 0944   GFRAA >60 02/03/2015 0944    INo results found for: SPEP, UPEP  Lab Results  Component Value Date   WBC 5.5 01/21/2017   NEUTROABS 3.7 01/21/2017   HGB 14.2 01/21/2017   HCT 41.5 01/21/2017   MCV 90.0 01/21/2017   PLT 197 01/21/2017      Chemistry      Component Value Date/Time   NA 139 01/21/2017 1142   K 4.9 01/21/2017 1142   CL 103 02/03/2015 0944   CO2 26 01/21/2017 1142   BUN 18.7 01/21/2017 1142   CREATININE 0.8 01/21/2017 1142      Component Value Date/Time   CALCIUM 10.3 01/21/2017 1142   ALKPHOS 73 01/21/2017 1142   AST 24 01/21/2017 1142   ALT 21 01/21/2017 1142   BILITOT 0.68 01/21/2017 1142       No results found for: LABCA2  No components found for: LABCA125  No results for input(s): INR in the last 168 hours.  Urinalysis No results found for: COLORURINE, APPEARANCEUR, LABSPEC, PHURINE, GLUCOSEU, HGBUR, BILIRUBINUR, KETONESUR, PROTEINUR,  UROBILINOGEN, NITRITE, LEUKOCYTESUR  STUDIES: She has had a diagnostic bilateral mammogram with tomography completed at [facility] on 07/17/2016 with results showing: Breast density B. No mammographic evidence of breast malignancy.   ASSESSMENT: 66 y.o. [place] woman status post right breast upper outer quadrant biopsy 07/13/2014, for a clinical T3 N0, stage IIB invasive ductal carcinoma, grade 3, estrogen receptor positive, HER-2 and progesterone receptor negative, with  an MIB-1 of 40%  (1) neoadjuvant chemotherapy starting 08/15/2013, consisting of doxorubicin and cyclophosphamide in dose dense fashion 4, completed 09/28/2014, followed by weekly paclitaxel 12 starting 10/19/2014, completed 01/04/2015  (2) status post right lumpectomy and sentinel lymph node sampling 02/11/2015 for a ypTis ypN0 ductal carcinoma in situ, with negative margins. This counts as a complete pathologic response  (3) adjuvant radiation completed 05/03/2015  (4) anastrozole started 05/25/2015  (a) bone density 04/05/2015 was normal with a T score of -0.9  PLAN: I spent approximately 30 minutes with Darlene Kelly with most of that time spent discussing her difficulties with aromatase inhibitors.  She is now 2 years out from definitive surgery for her breast cancer with no evidence of disease recurrence.  This is very favorable.  She has been on anastrozole almost 2 years.  She tolerated it poorly and was switched to letrozole with no significant improvement.  I think it would be difficult for her to continue a total of 5 years on these medications because of the arthralgias and myalgias she is experiencing.  Accordingly we took time to discuss the possibility of switching to tamoxifen.  In general tamoxifen does not cause this problem.  It can cause issues relating to vaginal wetness, blood clots, worsening cataracts, and uterine lining hyperplasia or polyps or even rarely cancer of the uterus.  We discussed that  the incidence of this is very low and that the chance of cure for that problem if it occurs as high.  Tamoxifen of course is also important in bone density: It helps the bones  Given all this she is interested in switching.  Accordingly she will go off the anastrozole now and start tamoxifen within the next few days.  Since she volunteers here, assuming she has any unusual side effects, she will let me know.  Otherwise I will see her again in 1 year, the plan being to continue tamoxifen until February 2023  She knows to call for any other issues that may develop before the next visit.     She is tolerating the anastrozole well. I do think she has a little bit of vaginal dryness concern and I gave her the information on our intimacy and pelvic health class.  She is getting any normals him out of exercise here. She does have some stiffness. I think that is not going to be due to the anastrozole but rather to her year of birth. Nevertheless I suggested she consider our tai chi class on Wednesday mornings. She could simply do that and then proceed directly to volunteering at she has been doing  I think her cough may be related to the lisinopril or it could possibly be reflux. At this point it is so minimal she doesn't want to work it up any further.  She will see Dr. [name] in about 6 months, after her next mammogram. She will see me again in a year. She knows to call for any problems that may develop before then.   Annebelle Bostic, [name], MD  01/28/17 11:10 AM Medical Oncology and Hematology [facility] [address] [place], [place] [address] Tel. [phone]    Fax. [phone]  This document serves as a record of services personally performed by Lurline Del, MD. It was created on her behalf by Steva Colder, a trained medical scribe. The creation of this record is based on the scribe's personal observations and the provider's statements to them. This document has been  checked and approved by  the attending provider.

## 2017-01-28 ENCOUNTER — Ambulatory Visit (HOSPITAL_BASED_OUTPATIENT_CLINIC_OR_DEPARTMENT_OTHER): Payer: Medicare Other | Admitting: Oncology

## 2017-01-28 ENCOUNTER — Telehealth: Payer: Self-pay | Admitting: Oncology

## 2017-01-28 VITALS — BP 166/76 | HR 58 | Temp 97.8°F | Resp 18 | Ht 61.0 in | Wt 174.9 lb

## 2017-01-28 DIAGNOSIS — C50411 Malignant neoplasm of upper-outer quadrant of right female breast: Secondary | ICD-10-CM | POA: Diagnosis present

## 2017-01-28 DIAGNOSIS — Z170 Estrogen receptor positive status [ER+]: Secondary | ICD-10-CM

## 2017-01-28 DIAGNOSIS — Z79811 Long term (current) use of aromatase inhibitors: Secondary | ICD-10-CM | POA: Diagnosis not present

## 2017-01-28 MED ORDER — TAMOXIFEN CITRATE 20 MG PO TABS: 20.0000 mg | ORAL_TABLET | Freq: Every day | ORAL | 12 refills | Status: AC

## 2017-01-28 NOTE — Telephone Encounter (Signed)
Scheduled appt per 10/29 los. Patient did not want avs or calendar.

## 2017-06-05 ENCOUNTER — Other Ambulatory Visit: Payer: Self-pay | Admitting: Oncology

## 2017-06-05 DIAGNOSIS — Z853 Personal history of malignant neoplasm of breast: Secondary | ICD-10-CM

## 2017-07-18 ENCOUNTER — Ambulatory Visit
Admission: RE | Admit: 2017-07-18 | Discharge: 2017-07-18 | Disposition: A | Discharge status: Home / Self Care | Payer: Medicare Other | Source: Ambulatory Visit | Attending: Oncology | Admitting: Oncology

## 2017-07-18 DIAGNOSIS — Z853 Personal history of malignant neoplasm of breast: Secondary | ICD-10-CM

## 2018-01-28 ENCOUNTER — Inpatient Hospital Stay: Payer: Medicare Other | Attending: Oncology

## 2018-01-28 DIAGNOSIS — C50411 Malignant neoplasm of upper-outer quadrant of right female breast: Secondary | ICD-10-CM | POA: Diagnosis present

## 2018-01-28 DIAGNOSIS — Z79811 Long term (current) use of aromatase inhibitors: Secondary | ICD-10-CM | POA: Diagnosis not present

## 2018-01-28 DIAGNOSIS — Z170 Estrogen receptor positive status [ER+]: Secondary | ICD-10-CM | POA: Insufficient documentation

## 2018-01-28 LAB — CBC WITH DIFFERENTIAL/PLATELET
ABS IMMATURE GRANULOCYTES: 0.03 10*3/uL (ref 0.00–0.07)
Basophils Absolute: 0 10*3/uL (ref 0.0–0.1)
Basophils Relative: 0 %
EOS PCT: 1 %
Eosinophils Absolute: 0.1 10*3/uL (ref 0.0–0.5)
HEMATOCRIT: 39.7 % (ref 36.0–46.0)
HEMOGLOBIN: 13.4 g/dL (ref 12.0–15.0)
Immature Granulocytes: 1 %
LYMPHS ABS: 1.1 10*3/uL (ref 0.7–4.0)
LYMPHS PCT: 21 %
MCH: 30.6 pg (ref 26.0–34.0)
MCHC: 33.8 g/dL (ref 30.0–36.0)
MCV: 90.6 fL (ref 80.0–100.0)
Monocytes Absolute: 0.4 10*3/uL (ref 0.1–1.0)
Monocytes Relative: 8 %
NEUTROS ABS: 3.8 10*3/uL (ref 1.7–7.7)
NRBC: 0 % (ref 0.0–0.2)
Neutrophils Relative %: 69 %
Platelets: 179 10*3/uL (ref 150–400)
RBC: 4.38 MIL/uL (ref 3.87–5.11)
RDW: 12.3 % (ref 11.5–15.5)
WBC: 5.4 10*3/uL (ref 4.0–10.5)

## 2018-01-28 LAB — COMPREHENSIVE METABOLIC PANEL
ALBUMIN: 3.6 g/dL (ref 3.5–5.0)
ALK PHOS: 47 U/L (ref 38–126)
ALT: 22 U/L (ref 0–44)
ANION GAP: 9 (ref 5–15)
AST: 26 U/L (ref 15–41)
BILIRUBIN TOTAL: 0.5 mg/dL (ref 0.3–1.2)
BUN: 14 mg/dL (ref 8–23)
CALCIUM: 9.2 mg/dL (ref 8.9–10.3)
CO2: 24 mmol/L (ref 22–32)
CREATININE: 0.81 mg/dL (ref 0.44–1.00)
Chloride: 106 mmol/L (ref 98–111)
GFR calc Af Amer: >60 mL/min (ref >60)
GFR calc non Af Amer: >60 mL/min (ref >60)
GLUCOSE: 135 mg/dL — AB (ref 70–99)
Potassium: 4.8 mmol/L (ref 3.5–5.1)
SODIUM: 139 mmol/L (ref 135–145)
TOTAL PROTEIN: 6.5 g/dL (ref 6.5–8.1)

## 2018-01-31 NOTE — Progress Notes (Signed)
[facility]  [phone] [phone]     ID: Darlene Kelly DOB: 02/18/1951  MR#: [id number]  YDX#:[id number]  Patient Care Team: [name], MD as PCP - General (Family Medicine) [name], MD as Consulting Physician (General Surgery) [name], [name], MD as Consulting Physician (Oncology) [name], MD as Consulting Physician (Radiation Oncology) [name], RN as Registered Nurse [name], RN as Registered Nurse [name], [name], MD as Referring Physician (Cardiology) [name], Jennefer Bravo, MD as Referring Physician (Dermatology) [name], NP (Inactive) as Nurse Practitioner (Nurse Practitioner) [name], NP as Nurse Practitioner (Hematology and Oncology) PCP: [name], MD OTHER MD:  CHIEF COMPLAINT: Estrogen receptor positive breast cancer  CURRENT TREATMENT: Tamoxifen  BREAST CANCER HISTORY: From the original intake note:  Darlene Kelly first noted a change in her right breast September 2015. At that time however she was consumed by the care of her mother-in-law, who had severe Alzheimer's disease. She finally passed in January 2016, but Darlene Kelly did not share the information regarding the right breast with her husband until late March 2016. At that point she was set up for bilateral diagnostic mammography with tomography at the breast Center, for 11/20/2014. This found the breast density to be category B. In the upper outer quadrant of the right breast there was a 4 cm irregular spiculated mass. There were no other findings of concern. The mass was palpable and firm, and by ultrasound measured 3.3 cm. The right axilla was negative sonographically.  Biopsy of the right breast mass in question was obtained for 03/22/2015. 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  On 07/20/2014 the patient underwent bilateral breast MRI. This measured the large irregular enhancing mass in the right breast at the 10:00 position at 5.0 cm. There were no additional worrisome findings in the right breast, and no findings in the left breast or any abnormal appearing lymph nodes.  The patient's subsequent history is as detailed below  INTERVAL HISTORY: Darlene Kelly returns today for follow-up of her estrogen receptor positive breast cancer. She is here alone.  She continues on Tamoxifen, and is tolerating well. She denies hot flashes or vaginal discharges. She noticed weight gain. She denies dietary changes.   Since our last visit one year ago, she had a bilateral diagnostic mammogram on 07/18/2017 showing a breast density of B.  It showed no evidence of malignancy.    REVIEW OF SYSTEMS: She still has shooting pains on her right breast. 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  She denies unusual headaches, visual changes, nausea, vomiting, or dizziness. There has been no unusual cough, phlegm production, or pleurisy. This been no change in bowel or bladder habits. She denies unexplained fatigue or unexplained weight loss, bleeding, rash, or fever. A detailed review of systems was otherwise stable.     PAST MEDICAL HISTORY: Past Medical History:  Diagnosis Date  . Breast cancer ([facility])   . Breast cancer of upper-outer quadrant of right female breast ([place]) 07/16/2014  . Diabetes mellitus without complication ([place])   . Heart murmur   . Hypertension   . Personal history of chemotherapy 2016  . Personal history of radiation therapy   . Skin cancer     PAST SURGICAL HISTORY: Past Surgical History:  Procedure Laterality Date  .  BREAST BIOPSY Right 07/13/2014   malignant  . BREAST LUMPECTOMY Right 02/11/2015   malignant  . PORT-A-CATH REMOVAL Left 02/11/2015   Procedure: REMOVAL PORT-A-CATH;  Surgeon:  [name], MD;  Location: [facility];  Service: General;  Laterality: Left;  . PORTACATH PLACEMENT N/A 08/03/2014   Procedure: INSERTION PORT-A-CATH;  Surgeon: [name], MD;  Location: [facility];  Service: General;  Laterality: N/A;  . RADIOACTIVE SEED GUIDED PARTIAL MASTECTOMY WITH AXILLARY SENTINEL LYMPH NODE BIOPSY Right 02/11/2015   Procedure: RIGHT RADIOACTIVE SEED LOCALIZATION LUMPECTOMY  WITH  RIGHT AXILLARY SENTINEL LYMPH NODE BIOPSY;  Surgeon: [name], MD;  Location: [facility];  Service: General;  Laterality: Right;    FAMILY HISTORY Family History  Problem Relation Age of Onset  . Lung cancer Father    the patient's father died from lung cancer in the setting of tobacco abuse at the age of 63. The patient's mother died from COPD at the age of 9. The patient has 3 brothers, no sisters. There is no history of breast or ovarian cancer in the family.  GYNECOLOGIC HISTORY:  No LMP recorded. Patient is postmenopausal. Menarche age 85, first live birth age 76. She is GX P3. She went through the change of life in her late 64s. She did not take hormone replacement.  SOCIAL HISTORY:  Darlene Kelly worked for [employer], but is now retired. Her husband Darlene Kelly is Programmer, systems for a [employer]. They have been married more than 40 years. Son Darlene Kelly  "Darlene" Darlene Kelly is a English as a second language teacher in [place]. Son Darlene Kelly "Darlene" Darlene Kelly is a Dealer in [place]. [name] is a Pharmacologist for [employer] in [place]. The patient has 3 grandchildren.    ADVANCED DIRECTIVES: In place   HEALTH MAINTENANCE: Social History   Tobacco Use  . Smoking status: Former Smoker    Packs/day: 0.50    Years: 12.00    Pack years: 6.00    Types: Cigarettes    Last attempt to quit: 04/02/1986    Years since quitting: 31.8  . Smokeless tobacco: Never Used  Substance Use Topics  . Alcohol use: Yes    Alcohol/week: 3.0 standard drinks    Types: 3  Glasses of wine per week  . Drug use: No     Colonoscopy: Never  PAP:  Bone density: 04/05/2015  Lipid panel:  Allergies  Allergen Reactions  . Latex Itching, Dermatitis and Rash  . Lipitor [Atorvastatin] Other (See Comments)    Bad leg cramps    Current Outpatient Medications  Medication Sig Dispense Refill  . aspirin EC 81 MG tablet Take 81 mg by mouth at bedtime.     . cetirizine (ZYRTEC) 10 MG tablet Take 10 mg by mouth daily as needed for allergies.    . Cholecalciferol (VITAMIN D) 2000 UNITS CAPS Take 1 capsule by mouth daily.    Marland Kitchen ibuprofen (ADVIL,MOTRIN) 200 MG tablet Take 400 mg by mouth every 6 (six) hours as needed for headache or moderate pain.    Marland Kitchen lisinopril (PRINIVIL,ZESTRIL) 10 MG tablet Take 10 mg by mouth at bedtime.   6  . MELATONIN PO Take 10 mg by mouth at bedtime.     . metoprolol succinate (TOPROL-XL) 100 MG 24 hr tablet Take 100 mg by mouth every evening.     . Omega-3 Fatty Acids (FISH OIL PO) Take 1 capsule by mouth every evening. MEGA RED 500 MG    . rosuvastatin (CRESTOR) 40 MG tablet  Take 20 mg by mouth 3 (three) times a week. Pt takes 1/2 tablet(36m) on Mondays, Wednesdays, Fridays.    . tamoxifen (NOLVADEX) 20 MG tablet Take 1 tablet (20 mg total) by mouth daily. 90 tablet 12   No current facility-administered medications for this visit.     OBJECTIVE: Middle-aged white woman in no acute distress Vitals:   02/04/18 0835  BP: 110/67  Pulse: 65  Resp: 18  Temp: 98 F (36.7 C)  SpO2: 99%     Body mass index is 33.78 kg/m.    ECOG FS:1 - Symptomatic but completely ambulatory  Sclerae unicteric, EOMs intact Oropharynx clear and moist No cervical or supraclavicular adenopathy Lungs no rales or rhonchi Heart regular rate and rhythm Abd soft, nontender, positive bowel sounds MSK no focal spinal tenderness, no upper extremity lymphedema Neuro: nonfocal, well oriented, appropriate affect Breasts: The right breast is status post lumpectomy and  radiation.  There are some irregularities in the breast contour but nothing suspicious for disease recurrence.  The left breast is benign.  Both axillae are benign.  LAB RESULTS:  CMP     Component Value Date/Time   NA 139 01/28/2018 0752   NA 139 01/21/2017 1142   K 4.8 01/28/2018 0752   K 4.9 01/21/2017 1142   CL 106 01/28/2018 0752   CO2 24 01/28/2018 0752   CO2 26 01/21/2017 1142   GLUCOSE 135 (H) 01/28/2018 0752   GLUCOSE 113 01/21/2017 1142   BUN 14 01/28/2018 0752   BUN 18.7 01/21/2017 1142   CREATININE 0.81 01/28/2018 0752   CREATININE 0.8 01/21/2017 1142   CALCIUM 9.2 01/28/2018 0752   CALCIUM 10.3 01/21/2017 1142   PROT 6.5 01/28/2018 0752   PROT 7.4 01/21/2017 1142   ALBUMIN 3.6 01/28/2018 0752   ALBUMIN 4.1 01/21/2017 1142   AST 26 01/28/2018 0752   AST 24 01/21/2017 1142   ALT 22 01/28/2018 0752   ALT 21 01/21/2017 1142   ALKPHOS 47 01/28/2018 0752   ALKPHOS 73 01/21/2017 1142   BILITOT 0.5 01/28/2018 0752   BILITOT 0.68 01/21/2017 1142   GFRNONAA >60 01/28/2018 0752   GFRAA >60 01/28/2018 0752    INo results found for: SPEP, UPEP  Lab Results  Component Value Date   WBC 5.4 01/28/2018   NEUTROABS 3.8 01/28/2018   HGB 13.4 01/28/2018   HCT 39.7 01/28/2018   MCV 90.6 01/28/2018   PLT 179 01/28/2018      Chemistry      Component Value Date/Time   NA 139 01/28/2018 0752   NA 139 01/21/2017 1142   K 4.8 01/28/2018 0752   K 4.9 01/21/2017 1142   CL 106 01/28/2018 0752   CO2 24 01/28/2018 0752   CO2 26 01/21/2017 1142   BUN 14 01/28/2018 0752   BUN 18.7 01/21/2017 1142   CREATININE 0.81 01/28/2018 0752   CREATININE 0.8 01/21/2017 1142      Component Value Date/Time   CALCIUM 9.2 01/28/2018 0752   CALCIUM 10.3 01/21/2017 1142   ALKPHOS 47 01/28/2018 0752   ALKPHOS 73 01/21/2017 1142   AST 26 01/28/2018 0752   AST 24 01/21/2017 1142   ALT 22 01/28/2018 0752   ALT 21 01/21/2017 1142   BILITOT 0.5 01/28/2018 0752   BILITOT 0.68 01/21/2017  1142       No results found for: LABCA2  No components found for: LABCA125  No results for input(s): INR in the last 168 hours.  Urinalysis No results found  for: COLORURINE, APPEARANCEUR, LABSPEC, PHURINE, GLUCOSEU, HGBUR, BILIRUBINUR, KETONESUR, PROTEINUR, UROBILINOGEN, NITRITE, LEUKOCYTESUR  STUDIES: She has had a diagnostic bilateral mammogram with tomography completed at [facility] on 07/17/2016 with results showing: Breast density B. No mammographic evidence of breast malignancy.   ASSESSMENT: 67 y.o. [place] woman status post right breast upper outer quadrant biopsy 07/13/2014, for a clinical T3 N0, stage IIB invasive ductal carcinoma, grade 3, estrogen receptor positive, HER-2 and progesterone receptor negative, with an MIB-1 of 40%  (1) neoadjuvant chemotherapy starting 08/15/2013, consisting of doxorubicin and cyclophosphamide in dose dense fashion 4, completed 09/28/2014, followed by weekly paclitaxel 12 starting 10/19/2014, completed 01/04/2015  (2) status post right lumpectomy and sentinel lymph node sampling 02/11/2015 for a ypTis ypN0 ductal carcinoma in situ, with negative margins. This counts as a complete pathologic response  (3) adjuvant radiation completed 05/03/2015  (4) tamoxifen started 05/25/2015  (a) bone density 04/05/2015 was normal with a T score of -0.9  PLAN: Darlene Kelly is now 3 years out from definitive surgery for her breast cancer with no evidence of disease recurrence.  This is very favorable.  She is tolerating antiestrogens well and the plan will be to continue that for total of 5 years.  Today I refilled her tamoxifen.  I also added a DEXA scan to her mammogram next spring.  She will see Dr. [name] shortly after that test.  Accordingly she will see me again in 1 year.  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  She knows to call for any other issues that may develop before her next visit.   [name],  [name], MD  02/04/18 8:51 AM Medical Oncology and Hematology [facility] [address] [place], [place] [address] Tel. [phone]    Fax. [phone]  Dierdre Searles Dweik am acting as scribe for Dr. [name] [name].  I, Lurline Del MD, have reviewed the above documentation for accuracy and completeness, and I agree with the above.

## 2018-02-04 ENCOUNTER — Telehealth: Payer: Self-pay | Admitting: Oncology

## 2018-02-04 ENCOUNTER — Inpatient Hospital Stay: Payer: Medicare Other | Attending: Oncology | Admitting: Oncology

## 2018-02-04 VITALS — BP 110/67 | HR 65 | Temp 98.0°F | Resp 18 | Ht 61.0 in | Wt 178.8 lb

## 2018-02-04 DIAGNOSIS — C50411 Malignant neoplasm of upper-outer quadrant of right female breast: Secondary | ICD-10-CM

## 2018-02-04 DIAGNOSIS — Z7982 Long term (current) use of aspirin: Secondary | ICD-10-CM | POA: Diagnosis not present

## 2018-02-04 DIAGNOSIS — I10 Essential (primary) hypertension: Secondary | ICD-10-CM | POA: Insufficient documentation

## 2018-02-04 DIAGNOSIS — Z85828 Personal history of other malignant neoplasm of skin: Secondary | ICD-10-CM | POA: Diagnosis not present

## 2018-02-04 DIAGNOSIS — Z79810 Long term (current) use of selective estrogen receptor modulators (SERMs): Secondary | ICD-10-CM | POA: Diagnosis not present

## 2018-02-04 DIAGNOSIS — Z79899 Other long term (current) drug therapy: Secondary | ICD-10-CM | POA: Insufficient documentation

## 2018-02-04 DIAGNOSIS — Z923 Personal history of irradiation: Secondary | ICD-10-CM | POA: Insufficient documentation

## 2018-02-04 DIAGNOSIS — M8580 Other specified disorders of bone density and structure, unspecified site: Secondary | ICD-10-CM | POA: Insufficient documentation

## 2018-02-04 DIAGNOSIS — Z170 Estrogen receptor positive status [ER+]: Secondary | ICD-10-CM | POA: Insufficient documentation

## 2018-02-04 DIAGNOSIS — Z87891 Personal history of nicotine dependence: Secondary | ICD-10-CM | POA: Insufficient documentation

## 2018-02-04 DIAGNOSIS — E119 Type 2 diabetes mellitus without complications: Secondary | ICD-10-CM

## 2018-02-04 MED ORDER — TAMOXIFEN CITRATE 20 MG PO TABS: 20.0000 mg | ORAL_TABLET | Freq: Every day | ORAL | 12 refills | Status: AC

## 2018-02-04 NOTE — Telephone Encounter (Signed)
Printed avs and calendar °

## 2018-06-09 ENCOUNTER — Other Ambulatory Visit: Payer: Self-pay | Admitting: Oncology

## 2018-06-09 DIAGNOSIS — Z9889 Other specified postprocedural states: Secondary | ICD-10-CM

## 2018-06-25 ENCOUNTER — Other Ambulatory Visit: Payer: Self-pay | Admitting: Cardiology

## 2018-06-25 DIAGNOSIS — I350 Nonrheumatic aortic (valve) stenosis: Secondary | ICD-10-CM

## 2018-07-21 ENCOUNTER — Other Ambulatory Visit: Payer: Self-pay

## 2018-07-21 ENCOUNTER — Ambulatory Visit
Admission: RE | Admit: 2018-07-21 | Discharge: 2018-07-21 | Disposition: A | Discharge status: Home / Self Care | Payer: Medicare Other | Source: Ambulatory Visit | Attending: Oncology | Admitting: Oncology

## 2018-07-21 DIAGNOSIS — Z9889 Other specified postprocedural states: Secondary | ICD-10-CM

## 2018-09-09 ENCOUNTER — Other Ambulatory Visit: Payer: Self-pay

## 2018-09-23 ENCOUNTER — Ambulatory Visit: Payer: Self-pay | Admitting: Cardiology

## 2018-10-07 DIAGNOSIS — I350 Nonrheumatic aortic (valve) stenosis: Secondary | ICD-10-CM | POA: Insufficient documentation

## 2018-10-07 NOTE — Progress Notes (Signed)
Subjective:  Primary Physician:  [name], MD  Patient ID: Darlene Kelly, female    DOB: 1951-01-10, 68 y.o.   MRN: [id number]  This visit type was conducted due to national recommendations for restrictions regarding the COVID-19 Pandemic (e.g. social distancing).  This format is felt to be most appropriate for this patient at this time.  All issues noted in this document were discussed and addressed.  No physical exam was performed (except for noted visual exam findings with Telehealth visits - very limited).  The patient has consented to conduct a Telehealth visit and understands insurance will be billed.   I connected with patient, on 10/14/18  by a telemedicine application and verified that I am speaking with the correct person using two identifiers.     I discussed the limitations of evaluation and management by telemedicine and the availability of in person appointments. The patient expressed understanding and agreed to proceed.   I have discussed with patient regarding the safety during COVID Pandemic and steps and precautions including social distancing with the patient.   Chief Complaint  Patient presents with  . Aortic Stenosis  . Aortic Insuffiency  . Hypertension  . Follow-up    HPI: KAMORI KITCHENS  is a 68 y.o. female  who presents for follow-up for Valvular heart disease. She has mild aortic stenosis by echocardiogram. There is mild to moderate AR and moderate MR.Patient denies any complaints of chest pain, tightness or pressure. No complaints of shortness of breath, orthopnea or PND.  No c/o palpitations.No history of dizzinees, near syncope or syncope.  No history of swelling on the legs. No leg claudication. No symptoms of transient weakness or numbness on any side of the body, no other symptoms of TIA.  Patient has hypertension, well controlled with therapy. 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 She also has history of diabetes and hyperlipidemia. She does not smoke. Patient has difficulty in walking due to knee pain.  She has not been doing any other type of exercises.  No history of thyroid problems. No h/o CAD. No history of TIA or CVA in past.  Patient had surgery, chemotherapy and radiation for carcinoma of the right breast, Pt. was told lymph nodes were negative, no mets. Her follow-up testing has remained negative for any recurrence.  Past Medical History:  Diagnosis Date  . Breast cancer ([facility])   . Breast cancer of upper-outer quadrant of right female breast ([facility]) 07/16/2014  . Diabetes mellitus without complication ([facility])   . Heart murmur   . Hypertension   . Personal history of chemotherapy 2016  . Personal history of radiation therapy   . Skin cancer     Past Surgical History:  Procedure Laterality Date  . BREAST BIOPSY Right 07/13/2014   malignant  . BREAST LUMPECTOMY Right 02/11/2015   malignant  . PORT-A-CATH REMOVAL Left 02/11/2015   Procedure: REMOVAL PORT-A-CATH;  Surgeon: [name], MD;  Location: [facility];  Service: General;  Laterality: Left;  . PORTACATH PLACEMENT N/A 08/03/2014   Procedure: INSERTION PORT-A-CATH;  Surgeon: [name], MD;  Location[facility];  Service: General;  Laterality: N/A;  . RADIOACTIVE SEED GUIDED PARTIAL MASTECTOMY WITH AXILLARY SENTINEL LYMPH NODE BIOPSY Right 02/11/2015   Procedure: RIGHT RADIOACTIVE SEED LOCALIZATION LUMPECTOMY  WITH  RIGHT AXILLARY SENTINEL LYMPH NODE BIOPSY;  Surgeon: [name], MD;  Location[facility];  Service: General;  Laterality: Right;    Social History   Socioeconomic History  . Marital status: Married    Spouse name: Not on file  . Number of children: 3  . Years of education: Not on file  . Highest education level: Not on file  Occupational History  . Not on file  Social Needs  . Financial resource strain: Not on file  .  Food insecurity    Worry: Not on file    Inability: Not on file  . Transportation needs    Medical: Not on file    Non-medical: Not on file  Tobacco Use  . Smoking status: Former Smoker    Packs/day: 0.50    Years: 12.00    Pack years: 6.00    Types: Cigarettes    Quit date: 04/02/1986    Years since quitting: 32.5  . Smokeless tobacco: Never Used  Substance and Sexual Activity  . Alcohol use: Yes    Alcohol/week: 3.0 standard drinks    Types: 3 Glasses of wine per week    Comment: occ  . Drug use: No  . Sexual activity: Not on file  Lifestyle  . Physical activity    Days per week: Not on file    Minutes per session: Not on file  . Stress: Not on file  Relationships  . Social Herbalist on phone: Not on file    Gets together: Not on file    Attends religious service: Not on file    Active member of club or organization: Not on file    Attends meetings of clubs or organizations: Not on file    Relationship status: Not on file  . Intimate partner violence    Fear of current or ex partner: Not on file    Emotionally abused: Not on file    Physically abused: Not on file    Forced sexual activity: Not on file  Other Topics Concern  . Not on file  Social History Narrative  . Not on file    Current Outpatient Medications on File Prior to Visit  Medication Sig Dispense Refill  . aspirin EC 81 MG tablet Take 81 mg by mouth at bedtime.     . cetirizine (ZYRTEC) 10 MG tablet Take 10 mg by mouth daily as needed for allergies.    . Cholecalciferol (VITAMIN D) 2000 UNITS CAPS Take 1 capsule by mouth daily.    Marland Kitchen ibuprofen (ADVIL,MOTRIN) 200 MG tablet Take 400 mg by mouth every 6 (six) hours as needed for headache or moderate pain.    Marland Kitchen lisinopril (PRINIVIL,ZESTRIL) 10 MG tablet Take 10 mg by mouth at bedtime.   6  . MELATONIN PO Take 10 mg by mouth at bedtime.     . metoprolol succinate (TOPROL-XL) 100 MG 24 hr tablet Take 100 mg by mouth every evening.     . Omega-3  Fatty Acids (FISH OIL PO) Take 1 capsule by mouth every evening. MEGA RED 500 MG    . rosuvastatin (CRESTOR) 40 MG tablet Take 20 mg by mouth daily.     . tamoxifen (NOLVADEX) 20 MG tablet daily.     No current facility-administered medications on file prior to visit.    Review of Systems  Constitutional: Negative for fever.  HENT: Negative for nosebleeds.   Eyes: Negative for blurred vision.  Respiratory: Negative for cough and shortness of breath.   Cardiovascular: Negative for chest pain, palpitations, claudication  and leg swelling.  Gastrointestinal: Negative for abdominal pain, nausea and vomiting.  Genitourinary: Negative for dysuria.  Musculoskeletal: Negative for myalgias.  Skin: Negative for itching and rash.  Neurological: Negative for dizziness, seizures and loss of consciousness.  Psychiatric/Behavioral: The patient is not nervous/anxious.        Objective:  Height 5\' 1"  (1.549 m), weight 174 lb (78.9 kg). Body mass index is 32.88 kg/m.  Physical Exam: Patient is alert and oriented, appeared very comfortable talking to me during the visit. No further detailed physical examination was possible as it was a telemedicine visit.  CARDIAC STUDIES:  Echocardiogram 10/08/2018: - Normal LV systolic function with EF 58%. Left ventricle cavity is normal in size. Moderate concentric hypertrophy of the left ventricle. Normal global wall motion. Unable to evaluate diastolic function due to severity of mitral annular calcification.  - Trileaflet aortic valve with moderate calcification. Mild to moderate aortic regurgitation. Moderate aortic valve stenosis. Aortic valve mean gradient of 20 mmHg, Vmax of 2.8  m/s. Calculated aortic valve area by continuity equation is 1.07 cm by continuity equation.  - Moderate (Grade II) mitral regurgitation. Moderate calcification of the mitral valve annulus. - Mild tricuspid regurgitation. Estimated pulmonary artery systolic pressure is 31 mmHg. -  IVC is normal with blunted respiratory response. Estimated RA pressure 8 mmHg. - Compared to previous study on 09/10/2017, there is mild interval progression of severity of aortic stenosis.  EKG- 09/23/2017- - Sinus rhythm, left atrial abnormality, LBBB   Assessment & Recommendations:   1. Mild aortic stenosis  2. Mild to moderate aortic regurgitation  3. Moderate mitral regurgitation  2. Essential hypertension  3. Hypercholesterolemia  Laboratory Exam:  CBC Latest Ref Rng & Units 01/28/2018 01/21/2017 12/26/2015  WBC 4.0 - 10.5 K/uL 5.4 5.5 5.8  Hemoglobin 12.0 - 15.0 g/dL 13.4 14.2 13.6  Hematocrit 36.0 - 46.0 % 39.7 41.5 39.4  Platelets 150 - 400 K/uL 179 197 212   CMP Latest Ref Rng & Units 01/28/2018 01/21/2017 12/26/2015  Glucose 70 - 99 mg/dL 135(H) 113 119  BUN 8 - 23 mg/dL 14 18.7 16.3  Creatinine 0.44 - 1.00 mg/dL 0.81 0.8 0.7  Sodium 135 - 145 mmol/L 139 139 139  Potassium 3.5 - 5.1 mmol/L 4.8 4.9 5.1  Chloride 98 - 111 mmol/L 106 - -  CO2 22 - 32 mmol/L 24 26 25   Calcium 8.9 - 10.3 mg/dL 9.2 10.3 10.0  Total Protein 6.5 - 8.1 g/dL 6.5 7.4 7.1  Total Bilirubin 0.3 - 1.2 mg/dL 0.5 0.68 0.67  Alkaline Phos 38 - 126 U/L 47 73 70  AST 15 - 41 U/L 26 24 18   ALT 0 - 44 U/L 22 21 16    Lipid Panel - 03/11/2018- cholesterol-127, LDL-78, HDL-43, triglycerides-80  Recommendation:  Valvular heart disease is stable clinically, she does not have any dyspnea, chest pain or dizziness.  Echocardiogram results were explained to her, valve problem is mild to moderate, we will continue observation. Her blood pressure is controlled with therapy. She will continue present medications for blood pressure and cholesterol. Patient was advised to follow ADA, low-salt, low-cholesterol diet. She was again advised calorie restriction to lose weight. She has difficulty in walking due to knee pain.  I have advised her to try riding stationary bicycle if she feels comfortable. She will continue  to have all the blood tests at her PCP's office.   I will see her in follow-up after 1 year, but, call us if there are any  cardiac problems in the interim. She will have repeat echocardiogram before the next visit for follow-up of valvular heart disease.  [name], MD, [facility] 10/14/2018, 11:03 AM [facility] [place] Pager: [phone] Office: [phone] If no answer Cell [phone]

## 2018-10-08 ENCOUNTER — Other Ambulatory Visit: Payer: Self-pay

## 2018-10-08 ENCOUNTER — Ambulatory Visit (INDEPENDENT_AMBULATORY_CARE_PROVIDER_SITE_OTHER): Payer: Medicare Other

## 2018-10-08 DIAGNOSIS — I350 Nonrheumatic aortic (valve) stenosis: Secondary | ICD-10-CM | POA: Diagnosis not present

## 2018-10-14 ENCOUNTER — Other Ambulatory Visit: Payer: Self-pay

## 2018-10-14 ENCOUNTER — Encounter: Payer: Self-pay | Admitting: Cardiology

## 2018-10-14 ENCOUNTER — Ambulatory Visit (INDEPENDENT_AMBULATORY_CARE_PROVIDER_SITE_OTHER): Payer: Medicare Other | Admitting: Cardiology

## 2018-10-14 VITALS — Ht 61.0 in | Wt 174.0 lb

## 2018-10-14 DIAGNOSIS — I350 Nonrheumatic aortic (valve) stenosis: Secondary | ICD-10-CM

## 2018-10-14 DIAGNOSIS — I10 Essential (primary) hypertension: Secondary | ICD-10-CM | POA: Diagnosis not present

## 2018-10-14 DIAGNOSIS — I340 Nonrheumatic mitral (valve) insufficiency: Secondary | ICD-10-CM | POA: Diagnosis not present

## 2018-10-14 DIAGNOSIS — I351 Nonrheumatic aortic (valve) insufficiency: Secondary | ICD-10-CM

## 2018-10-14 DIAGNOSIS — E7800 Pure hypercholesterolemia, unspecified: Secondary | ICD-10-CM | POA: Diagnosis not present

## 2019-01-06 ENCOUNTER — Encounter: Payer: Self-pay | Admitting: Cardiology

## 2019-01-06 ENCOUNTER — Ambulatory Visit (INDEPENDENT_AMBULATORY_CARE_PROVIDER_SITE_OTHER): Payer: Medicare Other | Admitting: Cardiology

## 2019-01-06 ENCOUNTER — Other Ambulatory Visit: Payer: Self-pay

## 2019-01-06 VITALS — BP 138/84 | HR 78 | Temp 98.2°F | Ht 61.0 in | Wt 172.0 lb

## 2019-01-06 DIAGNOSIS — I350 Nonrheumatic aortic (valve) stenosis: Secondary | ICD-10-CM

## 2019-01-06 DIAGNOSIS — I10 Essential (primary) hypertension: Secondary | ICD-10-CM | POA: Diagnosis not present

## 2019-01-06 DIAGNOSIS — Z01810 Encounter for preprocedural cardiovascular examination: Secondary | ICD-10-CM | POA: Diagnosis not present

## 2019-01-06 DIAGNOSIS — I340 Nonrheumatic mitral (valve) insufficiency: Secondary | ICD-10-CM | POA: Diagnosis not present

## 2019-01-06 DIAGNOSIS — E119 Type 2 diabetes mellitus without complications: Secondary | ICD-10-CM

## 2019-01-06 NOTE — Progress Notes (Signed)
Subjective:  Primary Physician:  Heywood Bene, PA-C  Patient ID: Darlene Kelly, female    DOB: 04/22/1950, 68 y.o.   MRN: [id number]    Chief Complaint  Patient presents with  . Hypertension  . Follow-up    pre op    HPI: Darlene Kelly  is a 68 y.o. female  with mild aortic stenosis, mild to moderate AR and moderate MR, hypertension, diabetes, and s/p surgery, chemotherapy and radiation for carcinoma of the right breast here for preoperative cardiac clearance for upcoming left knee arthroplasty on 01/19/19. She was seen over the telephone in July for her annual follow up.  Patient is essentially asymptomatic except for significant left knee pain. States that she avoids activities as she is not able to do much because of her knee. Can only walk short distances due to her knee and avoids the stairs as they are very difficult. Does have some shortness of breath with walking uphill. Otherwise, denies any complaints of chest pain, tightness or pressure. No complaints of shortness of breath, orthopnea or PND. No c/o palpitations.No history of dizzinees, near syncope or syncope. No history of swelling on the legs. No leg claudication. No symptoms of transient weakness or numbness on any side of the body, no other symptoms of TIA.  She is a former smoker that quit in 1988. She does have a family history of heart disease in her mother, father, and 1 brother who is deceased.   No history of thyroid problems. No h/o CAD. No history of TIA or CVA in past.    Past Medical History:  Diagnosis Date  . Breast cancer ([place])   . Breast cancer of upper-outer quadrant of right female breast ([place]) 07/16/2014  . Diabetes mellitus without complication ([place])   . Heart murmur   . Hypertension   . Personal history of chemotherapy 2016  . Personal history of radiation therapy   . Skin cancer     Past Surgical History:  Procedure Laterality Date  . BREAST BIOPSY Right 07/13/2014   malignant  . BREAST LUMPECTOMY Right 02/11/2015   malignant  . PORT-A-CATH REMOVAL Left 02/11/2015   Procedure: REMOVAL PORT-A-CATH;  Surgeon: [name], MD;  Location: [facility];  Service: General;  Laterality: Left;  . PORTACATH PLACEMENT N/A 08/03/2014   Procedure: INSERTION PORT-A-CATH;  Surgeon: [name], MD;  Location: [facility];  Service: General;  Laterality: N/A;  . RADIOACTIVE SEED GUIDED PARTIAL MASTECTOMY WITH AXILLARY SENTINEL LYMPH NODE BIOPSY Right 02/11/2015   Procedure: RIGHT RADIOACTIVE SEED LOCALIZATION LUMPECTOMY  WITH  RIGHT AXILLARY SENTINEL LYMPH NODE BIOPSY;  Surgeon: [name], MD;  Location: [facility];  Service: General;  Laterality: Right;    Social History   Socioeconomic History  . Marital status: Married    Spouse name: Not on file  . Number of children: 3  . Years of education: Not on file  . Highest education level: Not on file  Occupational History  . Not on file  Social Needs  . Financial resource strain: Not on file  . Food insecurity    Worry: Not on file    Inability: Not on file  . Transportation needs    Medical: Not on file    Non-medical: Not on file  Tobacco Use  . Smoking status: Former Smoker    Packs/day: 0.50    Years: 12.00    Pack years: 6.00    Types: Cigarettes    Quit date: 04/02/1986  Years since quitting: 32.7  . Smokeless tobacco: Never Used  Substance and Sexual Activity  . Alcohol use: Yes    Alcohol/week: 3.0 standard drinks    Types: 3 Glasses of wine per week    Comment: occ  . Drug use: No  . Sexual activity: Not on file  Lifestyle  . Physical activity    Days per week: Not on file    Minutes per session: Not on file  . Stress: Not on file  Relationships  . Social Herbalist on phone: Not on file    Gets together: Not on file    Attends religious service: Not on file    Active member of club or organization: Not on file    Attends meetings of clubs or organizations: Not on file     Relationship status: Not on file  . Intimate partner violence    Fear of current or ex partner: Not on file    Emotionally abused: Not on file    Physically abused: Not on file    Forced sexual activity: Not on file  Other Topics Concern  . Not on file  Social History Narrative  . Not on file    Current Outpatient Medications on File Prior to Visit  Medication Sig Dispense Refill  . aspirin EC 81 MG tablet Take 81 mg by mouth at bedtime.     . Cholecalciferol (VITAMIN D3) 250 MCG (10000 UT) TABS Take 10,000 Units by mouth every evening.    Marland Kitchen ibuprofen (ADVIL,MOTRIN) 200 MG tablet Take 400 mg by mouth every 6 (six) hours as needed for headache or moderate pain.    Marland Kitchen lisinopril (PRINIVIL,ZESTRIL) 10 MG tablet Take 10 mg by mouth at bedtime.   6  . MELATONIN PO Take 10 mg by mouth at bedtime.     . metoprolol succinate (TOPROL-XL) 100 MG 24 hr tablet Take 100 mg by mouth every evening.     . Multiple Vitamin (MULTIVITAMIN WITH MINERALS) TABS tablet Take 1 tablet by mouth daily.    . rosuvastatin (CRESTOR) 40 MG tablet Take 20 mg by mouth daily.     . tamoxifen (NOLVADEX) 20 MG tablet Take 20 mg by mouth daily.     Marland Kitchen zinc gluconate 50 MG tablet Take 50 mg by mouth daily.     No current facility-administered medications on file prior to visit.    Review of Systems  Constitutional: Negative for fever.  HENT: Negative for nosebleeds.   Eyes: Negative for blurred vision.  Respiratory: Negative for cough and shortness of breath.   Cardiovascular: Negative for chest pain, palpitations, claudication and leg swelling.  Gastrointestinal: Negative for abdominal pain, nausea and vomiting.  Genitourinary: Negative for dysuria.  Musculoskeletal: Negative for myalgias.  Skin: Negative for itching and rash.  Neurological: Negative for dizziness, seizures and loss of consciousness.  Psychiatric/Behavioral: The patient is not nervous/anxious.        Objective:  Blood pressure 138/84, pulse 78,  temperature 98.2 F (36.8 C), height 5\' 1"  (1.549 m), weight 172 lb (78 kg), SpO2 97 %. Body mass index is 32.5 kg/m.  Physical Exam  Constitutional: She is oriented to person, place, and time. Vital signs are normal. She appears well-developed and well-nourished.  HENT:  Head: Normocephalic and atraumatic.  Neck: Normal range of motion.  Cardiovascular: Normal rate, regular rhythm and intact distal pulses.  Murmur heard.  Early systolic murmur is present with a grade of 3/6 at the upper  right sternal border and upper left sternal border radiating to the neck. ESM  Holosystolic murmur of grade 3/6 is also present at the apex radiating to the axilla. Pulses:      Femoral pulses are 2+ on the right side and 2+ on the left side.      Popliteal pulses are 2+ on the right side and 2+ on the left side.       Dorsalis pedis pulses are 2+ on the right side and 2+ on the left side.       Posterior tibial pulses are 2+ on the right side and 2+ on the left side.  Pulmonary/Chest: Effort normal and breath sounds normal. No accessory muscle usage. No respiratory distress.  Abdominal: Soft. Bowel sounds are normal.  Musculoskeletal: Normal range of motion.  Neurological: She is alert and oriented to person, place, and time.  Skin: Skin is warm and dry.  Vitals reviewed. :   CARDIAC STUDIES:  Echocardiogram 10/08/2018: - Normal LV systolic function with EF 58%. Left ventricle cavity is normal in size. Moderate concentric hypertrophy of the left ventricle. Normal global wall motion. Unable to evaluate diastolic function due to severity of mitral annular calcification.  - Trileaflet aortic valve with moderate calcification. Mild to moderate aortic regurgitation. Moderate aortic valve stenosis. Aortic valve mean gradient of 20 mmHg, Vmax of 2.8  m/s. Calculated aortic valve area by continuity equation is 1.07 cm by continuity equation.  - Moderate (Grade II) mitral regurgitation. Moderate  calcification of the mitral valve annulus. - Mild tricuspid regurgitation. Estimated pulmonary artery systolic pressure is 31 mmHg. - IVC is normal with blunted respiratory response. Estimated RA pressure 8 mmHg. - Compared to previous study on 09/10/2017, there is mild interval progression of severity of aortic stenosis.  EKG- 09/23/2017- - Sinus rhythm, left atrial abnormality, LBBB   EKG 01/06/2019: Normal sinus rhythm at 70 bpm, normal axis, diffuse non-specific T wave abnormality.   Assessment & Recommendations:     ICD-10-CM   1. Preoperative cardiovascular examination  Z01.810 EKG 12-Lead    PCV MYOCARDIAL PERFUSION WITH LEXISCAN  2. Mild aortic stenosis  I35.0   3. Moderate mitral regurgitation  I34.0   4. Essential hypertension  I10 EKG 12-Lead  5. Type 2 diabetes mellitus without complication, without long-term current use of insulin (HCC)  E11.9      Laboratory Exam:  CBC Latest Ref Rng & Units 01/28/2018 01/21/2017 12/26/2015  WBC 4.0 - 10.5 K/uL 5.4 5.5 5.8  Hemoglobin 12.0 - 15.0 g/dL 13.4 14.2 13.6  Hematocrit 36.0 - 46.0 % 39.7 41.5 39.4  Platelets 150 - 400 K/uL 179 197 212   CMP Latest Ref Rng & Units 01/28/2018 01/21/2017 12/26/2015  Glucose 70 - 99 mg/dL 135(H) 113 119  BUN 8 - 23 mg/dL 14 18.7 16.3  Creatinine 0.44 - 1.00 mg/dL 0.81 0.8 0.7  Sodium 135 - 145 mmol/L 139 139 139  Potassium 3.5 - 5.1 mmol/L 4.8 4.9 5.1  Chloride 98 - 111 mmol/L 106 - -  CO2 22 - 32 mmol/L 24 26 25   Calcium 8.9 - 10.3 mg/dL 9.2 10.3 10.0  Total Protein 6.5 - 8.1 g/dL 6.5 7.4 7.1  Total Bilirubin 0.3 - 1.2 mg/dL 0.5 0.68 0.67  Alkaline Phos 38 - 126 U/L 47 73 70  AST 15 - 41 U/L 26 24 18   ALT 0 - 44 U/L 22 21 16    Lipid Panel - 03/11/2018- cholesterol-127, LDL-78, HDL-43, triglycerides-80  Recommendation:   Patient  is seen today for perioperative cardiac clearance for upcoming left knee arthroplasty. She has moderate aortic stenosis and moderate MR that has been overall  stable for the last several years and is essentially asymptomatic except for dyspnea with walking uphill. She has history of hypertension, hyperlipidemia, diet controlled diabetes, history of right breast cancer with radiation to her chest, former tobacco use, and family history of CAD. She has not previously had a stress test performed in view of lack of symptoms. Over the last 6 months her functional capacity has been significantly limited due to her left knee pain, making assessment difficult. In view of her risk factors, limited functional capacity, and upcoming procedure, I will arrange for lexiscan nuclear stress testing prior to her procedure. This will be performed next week (10/12). I will notify her of results and send clearance letter to Dr. Maureen Ralphs at that time.   No changes were made to medications today. Unless her stress testing is abnormal, I will see her back as previously scheduled in July 2021.   Darlene Dunn, MSN, APRN, FNP-C [facility]. [place] Office: [phone] Fax: [phone]

## 2019-01-07 ENCOUNTER — Encounter: Payer: Self-pay | Admitting: Cardiology

## 2019-01-08 NOTE — Patient Instructions (Addendum)
DUE TO COVID-19 ONLY ONE VISITOR IS ALLOWED TO COME WITH YOU AND STAY IN THE WAITING ROOM ONLY DURING PRE OP AND PROCEDURE DAY OF SURGERY. THE 1 VISITOR MAY VISIT WITH YOU AFTER SURGERY IN YOUR PRIVATE ROOM DURING VISITING HOURS ONLY!  YOU NEED TO HAVE A COVID 19 TEST ON__10/15_____ @_09 :00am______, THIS TEST MUST BE DONE BEFORE SURGERY, COME  [address], [place] , [address].  ([place]) ONCE YOUR COVID TEST IS COMPLETED, PLEASE BEGIN THE QUARANTINE INSTRUCTIONS AS OUTLINED IN YOUR HANDOUT.                AUBRIONNA HOLEMAN    Your procedure is scheduled on: Monday 01/19/2019   Report to [facility] Main  Entrance              Report to admitting at  0800 AM     Call this number if you have problems the morning of surgery [phone]      Do not eat food After Midnight  . YOU MAY HAVE CLEAR LIQUIDS FROM MIDNIGHT UNTIL 7:30 AM  . At 7:30 AM Please finish the prescribed Pre-Surgery Gatorade drink.   Nothing by mouth after you finish the Gatorade drink !    CLEAR LIQUID DIET   Foods Allowed                                                                     Foods Excluded  Coffee and tea, regular and decaf                             liquids that you cannot  Plain Jell-O any favor except red or purple                                           see through such as: Fruit ices (not with fruit pulp)                                     milk, soups, orange juice  Iced Popsicles                                    All solid food Carbonated beverages, regular and diet                                    Cranberry, grape and apple juices Sports drinks like Gatorade Lightly seasoned clear broth or consume(fat free) Sugar, honey syrup  Sample Menu Breakfast                                Lunch  Supper Cranberry juice                    Beef broth                            Chicken broth Jell-O                                      Grape juice                           Apple juice Coffee or tea                        Jell-O                                      Popsicle                                                Coffee or tea                        Coffee or tea  _____________________________________________________________________               BRUSH YOUR TEETH MORNING OF SURGERY AND RINSE YOUR MOUTH OUT, NO CHEWING GUM CANDY OR MINTS.     Take these medicines the morning of surgery with A SIP OF WATER: Rosuvastatin (Lipitor), Tamoxifen (Nolvadex) (pleases speak with onoclogist in  reference to taking this med morning of surgery_ per Iver Nestle PA-C               DO NOT TAKE ANY DIABETIC MEDICATIONS DAY OF [place] may not have any metal on your body including hair pins and              piercings  Do not wear jewelry, make-up, lotions, powders or perfumes, deodorant             Do not wear nail polish on your fingernails.  Do not shave  48 hours prior to surgery.     Do not bring valuables to the hospital. [place].  Contacts, dentures or bridgework may not be worn into surgery.                    Please read over the following fact sheets you were given: _____________________________________________________________________             [facility] - Preparing for Surgery  Before surgery, you can play an important role.   Because skin is not sterile, your skin needs to be as free of germs as possible.   You can reduce the number of germs on your skin by washing with CHG (chlorahexidine gluconate) soap before surgery.   CHG is an antiseptic  cleaner which kills germs and bonds with the skin to continue killing germs even after washing. Please DO NOT use if you have an allergy to CHG or antibacterial soaps.   If your skin becomes reddened/irritated stop using the CHG and inform your nurse when you  arrive at Short Stay. Do not shave (including legs and underarms) for at least 48 hours prior to the first CHG shower.    Please follow these instructions carefully:  1.  Shower with CHG Soap the night before surgery and the  morning of Surgery.  2.  If you choose to wash your hair, wash your hair first as usual with your  normal  shampoo.  3.  After you shampoo, rinse your hair and body thoroughly to remove the  shampoo.                                        4.  Use CHG as you would any other liquid soap.  You can apply chg directly  to the skin and wash                       Gently with a scrungie or clean washcloth.  5.  Apply the CHG Soap to your body ONLY FROM THE NECK DOWN.   Do not use on face/ open                           Wound or open sores. Avoid contact with eyes, ears mouth and genitals (private parts).                       Wash face,  Genitals (private parts) with your normal soap.             6.  Wash thoroughly, paying special attention to the area where your surgery  will be performed.  7.  Thoroughly rinse your body with warm water from the neck down.  8.  DO NOT shower/wash with your normal soap after using and rinsing off  the CHG Soap.                9.  Pat yourself dry with a clean towel.            10.  Wear clean pajamas.            11.  Place clean sheets on your bed the night of your first shower and do not  sleep with pets. Day of Surgery : Do not apply any lotions/deodorants the morning of surgery.  Please wear clean clothes to the hospital/surgery center.  FAILURE TO FOLLOW THESE INSTRUCTIONS MAY RESULT IN THE CANCELLATION OF YOUR SURGERY PATIENT SIGNATURE_________________________________  NURSE SIGNATURE__________________________________  ________________________________________________________________________   Adam Phenix  An incentive spirometer is a tool that can help keep your lungs clear and active. This tool measures how well you are  filling your lungs with each breath. Taking long deep breaths may help reverse or decrease the chance of developing breathing (pulmonary) problems (especially infection) following:  A long period of time when you are unable to move or be active. BEFORE THE PROCEDURE   If the spirometer includes an indicator to show your best effort, your nurse or respiratory therapist will set it to a desired goal.  If possible, sit up straight or  lean slightly forward. Try not to slouch.  Hold the incentive spirometer in an upright position. INSTRUCTIONS FOR USE  1. Sit on the edge of your bed if possible, or sit up as far as you can in bed or on a chair. 2. Hold the incentive spirometer in an upright position. 3. Breathe out normally. 4. Place the mouthpiece in your mouth and seal your lips tightly around it. 5. Breathe in slowly and as deeply as possible, raising the piston or the ball toward the top of the column. 6. Hold your breath for 3-5 seconds or for as long as possible. Allow the piston or ball to fall to the bottom of the column. 7. Remove the mouthpiece from your mouth and breathe out normally. 8. Rest for a few seconds and repeat Steps 1 through 7 at least 10 times every 1-2 hours when you are awake. Take your time and take a few normal breaths between deep breaths. 9. The spirometer may include an indicator to show your best effort. Use the indicator as a goal to work toward during each repetition. 10. After each set of 10 deep breaths, practice coughing to be sure your lungs are clear. If you have an incision (the cut made at the time of surgery), support your incision when coughing by placing a pillow or rolled up towels firmly against it. Once you are able to get out of bed, walk around indoors and cough well. You may stop using the incentive spirometer when instructed by your caregiver.  RISKS AND COMPLICATIONS  Take your time so you do not get dizzy or light-headed.  If you are in pain,  you may need to take or ask for pain medication before doing incentive spirometry. It is harder to take a deep breath if you are having pain. AFTER USE  Rest and breathe slowly and easily.  It can be helpful to keep track of a log of your progress. Your caregiver can provide you with a simple table to help with this. If you are using the spirometer at home, follow these instructions: [place] IF:   You are having difficultly using the spirometer.  You have trouble using the spirometer as often as instructed.  Your pain medication is not giving enough relief while using the spirometer.  You develop fever of 100.5 F (38.1 C) or higher. SEEK IMMEDIATE MEDICAL CARE IF:   You cough up bloody sputum that had not been present before.  You develop fever of 102 F (38.9 C) or greater.  You develop worsening pain at or near the incision site. MAKE SURE YOU:   Understand these instructions.  Will watch your condition.  Will get help right away if you are not doing well or get worse. Document Released: 07/30/2006 Document Revised: 06/11/2011 Document Reviewed: 09/30/2006 Exit[employer] Patient Information 2014 [employer], [place].   ________________________________________________________________________  WHAT IS A BLOOD TRANSFUSION? Blood Transfusion Information  A transfusion is the replacement of blood or some of its parts. Blood is made up of multiple cells which provide different functions.  Red blood cells carry oxygen and are used for blood loss replacement.  White blood cells fight against infection.  Platelets control bleeding.  Plasma helps clot blood.  Other blood products are available for specialized needs, such as hemophilia or other clotting disorders. BEFORE THE TRANSFUSION  Who gives blood for transfusions?   Healthy volunteers who are fully evaluated to make sure their blood is safe. This is blood bank blood. Transfusion  therapy is the safest it has ever  been in the practice of medicine. Before blood is taken from a donor, a complete history is taken to make sure that person has no history of diseases nor engages in risky social behavior (examples are intravenous drug use or sexual activity with multiple partners). The donor's travel history is screened to minimize risk of transmitting infections, such as malaria. The donated blood is tested for signs of infectious diseases, such as HIV and hepatitis. The blood is then tested to be sure it is compatible with you in order to minimize the chance of a transfusion reaction. If you or a relative donates blood, this is often done in anticipation of surgery and is not appropriate for emergency situations. It takes many days to process the donated blood. RISKS AND COMPLICATIONS Although transfusion therapy is very safe and saves many lives, the main dangers of transfusion include:   Getting an infectious disease.  Developing a transfusion reaction. This is an allergic reaction to something in the blood you were given. Every precaution is taken to prevent this. The decision to have a blood transfusion has been considered carefully by your caregiver before blood is given. Blood is not given unless the benefits outweigh the risks. AFTER THE TRANSFUSION  Right after receiving a blood transfusion, you will usually feel much better and more energetic. This is especially true if your red blood cells have gotten low (anemic). The transfusion raises the level of the red blood cells which carry oxygen, and this usually causes an energy increase.  The nurse administering the transfusion will monitor you carefully for complications. HOME CARE INSTRUCTIONS  No special instructions are needed after a transfusion. You may find your energy is better. Speak with your caregiver about any limitations on activity for underlying diseases you may have. SEEK MEDICAL CARE IF:   Your condition is not improving after your  transfusion.  You develop redness or irritation at the intravenous (IV) site. SEEK IMMEDIATE MEDICAL CARE IF:  Any of the following symptoms occur over the next 12 hours:  Shaking chills.  You have a temperature by mouth above 102 F (38.9 C), not controlled by medicine.  Chest, back, or muscle pain.  People around you feel you are not acting correctly or are confused.  Shortness of breath or difficulty breathing.  Dizziness and fainting.  You get a rash or develop hives.  You have a decrease in urine output.  Your urine turns a dark color or changes to pink, red, or brown. Any of the following symptoms occur over the next 10 days:  You have a temperature by mouth above 102 F (38.9 C), not controlled by medicine.  Shortness of breath.  Weakness after normal activity.  The white part of the eye turns yellow (jaundice).  You have a decrease in the amount of urine or are urinating less often.  Your urine turns a dark color or changes to pink, red, or brown. Document Released: 03/16/2000 Document Revised: 06/11/2011 Document Reviewed: 11/03/2007 [facility] Patient Information 2014 [place], [place].  _______________________________________________________________________

## 2019-01-09 ENCOUNTER — Other Ambulatory Visit: Payer: Self-pay

## 2019-01-09 ENCOUNTER — Encounter (HOSPITAL_COMMUNITY): Payer: Self-pay

## 2019-01-09 ENCOUNTER — Encounter (HOSPITAL_COMMUNITY)
Admission: RE | Admit: 2019-01-09 | Discharge: 2019-01-09 | Disposition: A | Discharge status: Home / Self Care | Payer: Medicare Other | Source: Ambulatory Visit | Attending: Orthopedic Surgery | Admitting: Orthopedic Surgery

## 2019-01-09 DIAGNOSIS — Z87891 Personal history of nicotine dependence: Secondary | ICD-10-CM | POA: Insufficient documentation

## 2019-01-09 DIAGNOSIS — I080 Rheumatic disorders of both mitral and aortic valves: Secondary | ICD-10-CM | POA: Diagnosis not present

## 2019-01-09 DIAGNOSIS — Z7982 Long term (current) use of aspirin: Secondary | ICD-10-CM | POA: Insufficient documentation

## 2019-01-09 DIAGNOSIS — Z7901 Long term (current) use of anticoagulants: Secondary | ICD-10-CM | POA: Diagnosis not present

## 2019-01-09 DIAGNOSIS — E119 Type 2 diabetes mellitus without complications: Secondary | ICD-10-CM | POA: Diagnosis not present

## 2019-01-09 DIAGNOSIS — I119 Hypertensive heart disease without heart failure: Secondary | ICD-10-CM | POA: Insufficient documentation

## 2019-01-09 DIAGNOSIS — Z01812 Encounter for preprocedural laboratory examination: Secondary | ICD-10-CM | POA: Diagnosis not present

## 2019-01-09 DIAGNOSIS — M1712 Unilateral primary osteoarthritis, left knee: Secondary | ICD-10-CM | POA: Insufficient documentation

## 2019-01-09 DIAGNOSIS — Z79899 Other long term (current) drug therapy: Secondary | ICD-10-CM | POA: Diagnosis not present

## 2019-01-09 LAB — SURGICAL PCR SCREEN
MRSA, PCR: NEGATIVE
Staphylococcus aureus: NEGATIVE

## 2019-01-09 LAB — COMPREHENSIVE METABOLIC PANEL
ALT: 24 U/L (ref 0–44)
AST: 28 U/L (ref 15–41)
Albumin: 4 g/dL (ref 3.5–5.0)
Alkaline Phosphatase: 42 U/L (ref 38–126)
Anion gap: 9 (ref 5–15)
BUN: 11 mg/dL (ref 8–23)
CO2: 24 mmol/L (ref 22–32)
Calcium: 9.6 mg/dL (ref 8.9–10.3)
Chloride: 104 mmol/L (ref 98–111)
Creatinine, Ser: 0.69 mg/dL (ref 0.44–1.00)
GFR calc Af Amer: >60 mL/min (ref >60)
GFR calc non Af Amer: >60 mL/min (ref >60)
Glucose, Bld: 140 mg/dL — ABNORMAL HIGH (ref 70–99)
Potassium: 4.6 mmol/L (ref 3.5–5.1)
Sodium: 137 mmol/L (ref 135–145)
Total Bilirubin: 0.4 mg/dL (ref 0.3–1.2)
Total Protein: 6.9 g/dL (ref 6.5–8.1)

## 2019-01-09 LAB — CBC
HCT: 40.1 % (ref 36.0–46.0)
Hemoglobin: 13.4 g/dL (ref 12.0–15.0)
MCH: 30.8 pg (ref 26.0–34.0)
MCHC: 33.4 g/dL (ref 30.0–36.0)
MCV: 92.2 fL (ref 80.0–100.0)
Platelets: 185 10*3/uL (ref 150–400)
RBC: 4.35 MIL/uL (ref 3.87–5.11)
RDW: 12.7 % (ref 11.5–15.5)
WBC: 5.5 10*3/uL (ref 4.0–10.5)
nRBC: 0 % (ref 0.0–0.2)

## 2019-01-09 LAB — PROTIME-INR
INR: 1 (ref 0.8–1.2)
Prothrombin Time: 12.8 seconds (ref 11.4–15.2)

## 2019-01-09 LAB — ABO/RH: ABO/RH(D): A POS

## 2019-01-09 LAB — APTT: aPTT: 25 seconds (ref 24–36)

## 2019-01-09 LAB — HEMOGLOBIN A1C
Hgb A1c MFr Bld: 6.1 % — ABNORMAL HIGH (ref 4.8–5.6)
Mean Plasma Glucose: 128.37 mg/dL

## 2019-01-09 LAB — GLUCOSE, CAPILLARY: Glucose-Capillary: 126 mg/dL — ABNORMAL HIGH (ref 70–99)

## 2019-01-09 NOTE — Pre-Procedure Instructions (Addendum)
PCP - Dr. Juanita Craver   Pt has an appt with Mayra Reel PA for clearance on 01/13/2019  Cardiologist - Wynona Canes NP [facility])    Oncologist: [name]: Pt will see him about stopping tamoxifen. She stopped it on yesterday 01/08/2019 per Dr. Wynelle Link  Chest x-ray - none EKG - 01/07/2019 Stress Test - scheduled for 01/12/2019 (requested for cardiac clearance) ECHO - 10/08/2018 Cardiac Cath - none  Sleep Study - none CPAP - none  Fasting Blood Sugar - doesn't check her blood glucose Checks Blood Sugar __0___ times a day  Blood Thinner Instructions: Aspirin Instructions: Per Dr. Wynelle Link pt is to stop 5 days prior to surgery. Last dose should be on 01/14/2019 Last Dose:  Anesthesia review: sent to Konrad Felix PA for review HX of: HTN, moderate aortic stenosis from ECHO on .10/08/2018. R breast CA, Heart murmer, 02/11/2015 radioactive seed guided partial mastectomy with axillary sentinel lymph biopsy.   Patient denies shortness of breath, fever, cough and chest pain at PAT appointment   Patient verbalized understanding of instructions that were given to them at the PAT appointment. Patient was also instructed that they will need to review over the PAT instructions again at home before surgery.

## 2019-01-12 ENCOUNTER — Other Ambulatory Visit: Payer: Self-pay

## 2019-01-12 ENCOUNTER — Ambulatory Visit (INDEPENDENT_AMBULATORY_CARE_PROVIDER_SITE_OTHER): Payer: Medicare Other

## 2019-01-12 DIAGNOSIS — Z01810 Encounter for preprocedural cardiovascular examination: Secondary | ICD-10-CM

## 2019-01-13 ENCOUNTER — Encounter: Payer: Self-pay | Admitting: Cardiology

## 2019-01-13 NOTE — Progress Notes (Signed)
Anesthesia Chart Review   Case: [id number] Date/Time: 01/19/19 1015   Procedure: TOTAL KNEE ARTHROPLASTY (Left Knee) - 28min   Anesthesia type: Choice   Pre-op diagnosis: left knee osteoarthritis   [facility]: [facility] ROOM 10 / [facility]   Surgeon: Gaynelle Arabian, MD      DISCUSSION:68 y.o. former smoker (6 pack years, quit 04/02/86) with h/o DM II, HTN, breast cancer s/p chemotherapy 2016, left knee OA scheduled for above procedure 01/19/2019 with Dr. Gaynelle Arabian.   Pt last seen by cardiology 01/06/2019 for preoperative evaluation.  Low risk stress test 01/12/2019.  Per Jeri Lager, NP, "Please let patient know that her stress test looks good, low risk study without evidence of blockages. She can proceed with surgery as planned and I have sent clearance letter to her surgeon."  Anticipate pt can proceed with planned procedure barring acute status change.   VS: BP (!) 141/89   Pulse 73   Temp 37.1 C   Resp 18   Ht 5\' 1"  (1.549 m)   Wt 57.2 kg   SpO2 98%   BMI 23.81 kg/m   PROVIDERS: Heywood Bene, PA-C is PCP   Vernell Leep, MD is Cardiologist  LABS: Labs reviewed: Acceptable for surgery. (all labs ordered are listed, but only abnormal results are displayed)  Labs Reviewed  COMPREHENSIVE METABOLIC PANEL - Abnormal; Notable for the following components:      Result Value   Glucose, Bld 140 (*)    All other components within normal limits  GLUCOSE, CAPILLARY - Abnormal; Notable for the following components:   Glucose-Capillary 126 (*)    All other components within normal limits  HEMOGLOBIN A1C - Abnormal; Notable for the following components:   Hgb A1c MFr Bld 6.1 (*)    All other components within normal limits  SURGICAL PCR SCREEN  APTT  CBC  PROTIME-INR  TYPE AND SCREEN  ABO/RH     IMAGES:   EKG: 01/07/2019 Rate 70 bpm Normal sinus rhythm at 70 bpm, normal axis, diffuse non-specific T wave abnormality  CV: Lexiscan Myoview Stress Test  01/12/2019: Stress EKG is non-diagnostic, as this is a pharmacological stress test. In addition, Rest and stress EKG show sinus rhythm, LBBB.  SPECT images reveal basal to apical inferolateral, basal inferior, basal inferoseptal, seen both on stress and rest images. While tissue attenuation is present, ischemia in this region cannot be completely excluded.  Stress LVEF is 73%. Low risk study. No prior studies for comparison.  Echo 08/04/2014 Conclusion:  1. Left ventricle cavity is normal in size.  Mild concentric hypertrophy of the left ventricle.  Normal global wall motion.  Normal diastolic filling pattern.  Calculated EF 60%.  2. Left atrial cavity is mildly dilated 3. Mild aortic valve stenosis.  Aortic valve peak gradient was 30 with a mean of 15 mmHg, calculated aortic valve area 1.8 cm.  4. Trace mitral regurgitation.  Moderate calcification of the mitral valve annulus.  5. Structurally normal tricuspid valve with trace regurgitation.  No evidence of pulmonary hypertension.  6. Compared to the study done on 10/22/2013, no significant change in severity of aortic stenosis.  Past Medical History:  Diagnosis Date  . Breast cancer ([place])   . Breast cancer of upper-outer quadrant of right female breast ([place]) 07/16/2014  . Diabetes mellitus without complication ([place])   . Heart murmur   . Heart murmur    from rhumatic fever as a child  . Hypertension   . Personal history of chemotherapy  2016  . Personal history of radiation therapy   . Skin cancer     Past Surgical History:  Procedure Laterality Date  . BREAST BIOPSY Right 07/13/2014   malignant  . BREAST LUMPECTOMY Right 02/11/2015   malignant  . PORT-A-CATH REMOVAL Left 02/11/2015   Procedure: REMOVAL PORT-A-CATH;  Surgeon: [name], MD;  Location: [place];  Service: General;  Laterality: Left;  . PORTACATH PLACEMENT N/A 08/03/2014   Procedure: INSERTION PORT-A-CATH;  Surgeon: [name], MD;  [facility];   Service: General;  Laterality: N/A;  . RADIOACTIVE SEED GUIDED PARTIAL MASTECTOMY WITH AXILLARY SENTINEL LYMPH NODE BIOPSY Right 02/11/2015   Procedure: RIGHT RADIOACTIVE SEED LOCALIZATION LUMPECTOMY  WITH  RIGHT AXILLARY SENTINEL LYMPH NODE BIOPSY;  Surgeon: [name], MD;  Location: [place];  Service: General;  Laterality: Right;    MEDICATIONS: . aspirin EC 81 MG tablet  . Cholecalciferol (VITAMIN D3) 250 MCG (10000 UT) TABS  . ibuprofen (ADVIL,MOTRIN) 200 MG tablet  . lisinopril (PRINIVIL,ZESTRIL) 10 MG tablet  . MELATONIN PO  . metoprolol succinate (TOPROL-XL) 100 MG 24 hr tablet  . Multiple Vitamin (MULTIVITAMIN WITH MINERALS) TABS tablet  . rosuvastatin (CRESTOR) 40 MG tablet  . tamoxifen (NOLVADEX) 20 MG tablet  . zinc gluconate 50 MG tablet   No current facility-administered medications for this encounter.    Maia Plan [facility] Pre-Surgical Testing [phone] 01/13/19  11:00 AM

## 2019-01-13 NOTE — Progress Notes (Signed)
Please let patient know that her stress test looks good, low risk study without evidence of blockages. She can proceed with surgery as planned and I have sent clearance letter to her surgeon.

## 2019-01-13 NOTE — Anesthesia Preprocedure Evaluation (Addendum)
Anesthesia Evaluation  Patient identified by MRN, date of birth, ID band Patient awake    Reviewed: Allergy & Precautions, NPO status , Patient's Chart, lab work & pertinent test results  Airway Mallampati: II  TM Distance: >3 FB Neck ROM: Full    Dental no notable dental hx.    Pulmonary former smoker,    Pulmonary exam normal breath sounds clear to auscultation       Cardiovascular hypertension, Pt. on medications and Pt. on home beta blockers + Valvular Problems/Murmurs AS  Rhythm:Regular Rate:Normal + Systolic murmurs ECG: NSR, rate 70  Stress EKG is non-diagnostic, as this is a pharmacological stress test. In  addition, Rest and stress EKG show sinus rhythm, LBBB.  SPECT images reveal basal to apical inferolateral, basal inferior, basal  inferoseptal, seen both on stress and rest images. While tissue  attenuation is present, ischemia in this region cannot be completely  excluded. Stress LVEF is 73%. Low risk study.  ECHO: Normal LV systolic function with EF 58%. Left ventricle cavity is normal in size. Moderate concentric hypertrophy of the left ventricle. Normal global wall motion. Unable to evaluate diastolic function due to severity of mitral annular calcification.  Trileaflet aortic valve with moderate calcification. Mild to moderate aortic regurgitation. Moderate aortic valve stenosis. Aortic valve mean gradient of 20 mmHg, Vmax of 2.8  m/s. Calculated aortic valve area by continuity equation is 1.07 cm by continuity equation. Moderate (Grade II) mitral regurgitation. Moderate calcification of the mitral valve annulus. Mild tricuspid regurgitation. Estimated pulmonary artery systolic pressure is 31 mmHg. IVC is normal with blunted respiratory response. Estimated RA pressure 8 mmHg. Compared to previous study on 09/10/2017, there is mild interval progression of severity of aortic stenosis. .   Neuro/Psych negative  neurological ROS  negative psych ROS   GI/Hepatic negative GI ROS, Neg liver ROS,   Endo/Other  diabetes  Renal/GU negative Renal ROS     Musculoskeletal negative musculoskeletal ROS (+)   Abdominal   Peds  Hematology HLD   Anesthesia Other Findings left knee osteoarthritis  Reproductive/Obstetrics                           Anesthesia Physical Anesthesia Plan  ASA: III  Anesthesia Plan: Spinal and Regional   Post-op Pain Management:  Regional for Post-op pain   Induction: Intravenous  PONV Risk Score and Plan: 2 and Ondansetron, Treatment may vary due to age or medical condition, Midazolam and Dexamethasone  Airway Management Planned: Simple Face Mask  Additional Equipment:   Intra-op Plan:   Post-operative Plan:   Informed Consent: I have reviewed the patients History and Physical, chart, labs and discussed the procedure including the risks, benefits and alternatives for the proposed anesthesia with the patient or authorized representative who has indicated his/her understanding and acceptance.     Dental advisory given  Plan Discussed with: CRNA  Anesthesia Plan Comments: (Reviewed PAT note 01/09/2019, Konrad Felix, PA-C)       Anesthesia Quick Evaluation

## 2019-01-13 NOTE — Progress Notes (Signed)
S/w patient advised her of stress test results

## 2019-01-15 ENCOUNTER — Other Ambulatory Visit (HOSPITAL_COMMUNITY)
Admission: RE | Admit: 2019-01-15 | Discharge: 2019-01-15 | Disposition: A | Discharge status: Home / Self Care | Payer: Medicare Other | Source: Ambulatory Visit | Attending: Orthopedic Surgery | Admitting: Orthopedic Surgery

## 2019-01-15 DIAGNOSIS — Z20828 Contact with and (suspected) exposure to other viral communicable diseases: Secondary | ICD-10-CM | POA: Diagnosis not present

## 2019-01-15 DIAGNOSIS — Z01812 Encounter for preprocedural laboratory examination: Secondary | ICD-10-CM | POA: Insufficient documentation

## 2019-01-15 NOTE — H&P (Signed)
TOTAL KNEE ADMISSION H&P  Patient is being admitted for left total knee arthroplasty.  Subjective:  Chief Complaint:left knee pain.  HPI: Darlene Kelly, 68 y.o. female, has a history of pain and functional disability in the left knee due to arthritis and has failed non-surgical conservative treatments for greater than 12 weeks to includeNSAID's and/or analgesics, corticosteriod injections, flexibility and strengthening excercises, use of assistive devices and activity modification.  Onset of symptoms was gradual, starting 5 years ago with gradually worsening course since that time. The patient noted prior procedures on the knee to include  arthroscopy and menisectomy on the left knee(s).  Patient currently rates pain in the left knee(s) at 8 out of 10 with activity. Patient has night pain, worsening of pain with activity and weight bearing, pain that interferes with activities of daily living, pain with passive range of motion, crepitus and joint swelling.  Patient has evidence of periarticular osteophytes and joint space narrowing by imaging studies. There is no active infection.  Patient Active Problem List   Diagnosis Date Noted  . Mild aortic stenosis 10/07/2018  . Osteopenia 02/04/2018  . Diabetes mellitus type II, non insulin dependent ([place]) 07/21/2014  . Essential hypertension 07/21/2014  . Cardiac murmur 07/21/2014  . Hypercholesterolemia 07/21/2014  . Malignant neoplasm of upper-outer quadrant of right breast in female, estrogen receptor positive ([place]) 07/16/2014   Past Medical History:  Diagnosis Date  . Breast cancer ([place])   . Breast cancer of upper-outer quadrant of right female breast ([place]) 07/16/2014  . Diabetes mellitus without complication ([place])   . Heart murmur   . Heart murmur    from rhumatic fever as a child  . Hypertension   . Personal history of chemotherapy 2016  . Personal history of radiation therapy   . Skin cancer     Past Surgical History:  Procedure  Laterality Date  . BREAST BIOPSY Right 07/13/2014   malignant  . BREAST LUMPECTOMY Right 02/11/2015   malignant  . PORT-A-CATH REMOVAL Left 02/11/2015   Procedure: REMOVAL PORT-A-CATH;  Surgeon: [name], MD;  Location: [place];  Service: General;  Laterality: Left;  . PORTACATH PLACEMENT N/A 08/03/2014   Procedure: INSERTION PORT-A-CATH;  Surgeon: [name], MD;  [facility];  Service: General;  Laterality: N/A;  . RADIOACTIVE SEED GUIDED PARTIAL MASTECTOMY WITH AXILLARY SENTINEL LYMPH NODE BIOPSY Right 02/11/2015   Procedure: RIGHT RADIOACTIVE SEED LOCALIZATION LUMPECTOMY  WITH  RIGHT AXILLARY SENTINEL LYMPH NODE BIOPSY;  Surgeon: [name], MD;  Location: [place];  Service: General;  Laterality: Right;       Current Outpatient Medications  Medication Sig Dispense Refill Last Dose  . aspirin EC 81 MG tablet Take 81 mg by mouth at bedtime.      . Cholecalciferol (VITAMIN D3) 250 MCG (10000 UT) TABS Take 10,000 Units by mouth every evening.     Marland Kitchen ibuprofen (ADVIL,MOTRIN) 200 MG tablet Take 400 mg by mouth every 6 (six) hours as needed for headache or moderate pain.     Marland Kitchen lisinopril (PRINIVIL,ZESTRIL) 10 MG tablet Take 10 mg by mouth at bedtime.   6   . MELATONIN PO Take 10 mg by mouth at bedtime.      . metoprolol succinate (TOPROL-XL) 100 MG 24 hr tablet Take 100 mg by mouth every evening.      . Multiple Vitamin (MULTIVITAMIN WITH MINERALS) TABS tablet Take 1 tablet by mouth daily.     . rosuvastatin (CRESTOR) 40 MG tablet Take 20  mg by mouth daily.      . tamoxifen (NOLVADEX) 20 MG tablet Take 20 mg by mouth daily.      Marland Kitchen zinc gluconate 50 MG tablet Take 50 mg by mouth daily.      Allergies  Allergen Reactions  . Latex Itching, Dermatitis and Rash  . Lipitor [Atorvastatin] Other (See Comments)    Bad leg cramps    Social History   Tobacco Use  . Smoking status: Former Smoker    Packs/day: 0.50    Years: 12.00    Pack years: 6.00    Types: Cigarettes     Quit date: 04/02/1986    Years since quitting: 32.8  . Smokeless tobacco: Never Used  Substance Use Topics  . Alcohol use: Yes    Alcohol/week: 3.0 standard drinks    Types: 3 Glasses of wine per week    Comment: occ    Family History  Problem Relation Age of Onset  . Heart disease Mother   . Lung cancer Father   . Heart disease Father   . Heart disease Brother      ROS  Objective:  Physical Exam  Constitutional: She is oriented to person, place, and time. She appears well-developed. No distress.  Overweight  HENT:  Head: Normocephalic and atraumatic.  Right Ear: External ear normal.  Left Ear: External ear normal.  Nose: Nose normal.  Mouth/Throat: Oropharynx is clear and moist.  Eyes: Conjunctivae and EOM are normal.  Neck: Normal range of motion. Neck supple.  Cardiovascular: Normal rate and intact distal pulses. An irregularly irregular rhythm present. Exam reveals no gallop.  Murmur heard.  Systolic murmur is present with a grade of 3/6. Respiratory: Effort normal and breath sounds normal. No respiratory distress. She has no wheezes.  GI: Soft. Bowel sounds are normal. She exhibits no distension. There is no abdominal tenderness.  Musculoskeletal:     Comments: Antalgic gait pattern favoring the left side without using assisted devices.  Right Hip Exam: ROM: Normal without discomfort. There is no tenderness over the greater trochanter bursa.  Right Knee Exam: No effusion. Range of motion is 0-125 degrees. No crepitus on range of motion of the knee. No medial joint line tenderness No lateral joint line tenderness. Stable knee.  Left Hip Exam: ROM: Normal without discomfort. There is no tenderness over the greater trochanter bursa.  Left Knee Exam: Mild effusion. Range of motion is 0-125 degrees. Marked crepitus on range of motion of the knee. Positive medial joint line tenderness No lateral joint line tenderness. Stable knee.  Neurological: She is  alert and oriented to person, place, and time. She has normal strength. No sensory deficit.  Skin: No rash noted. She is not diaphoretic. No erythema.  Psychiatric: She has a normal mood and affect. Her behavior is normal.    Ht: 5 ft 2 in  Wt: 169 lbs  BMI: 30.9  BP: 150/92 sitting L arm 1 Pulse: 76 bpm irregularly irregular   Imaging Review Plain radiographs demonstrate severe degenerative joint disease of the left knee(s). The overall alignment ismild varus. The bone quality appears to be good for age and reported activity level.    Assessment/Plan:  End stage primary osteoarthritis, left knee   The patient history, physical examination, clinical judgment of the provider and imaging studies are consistent with end stage degenerative joint disease of the left knee(s) and total knee arthroplasty is deemed medically necessary. The treatment options including medical management, injection therapy arthroscopy and arthroplasty  were discussed at length. The risks and benefits of total knee arthroplasty were presented and reviewed. The risks due to aseptic loosening, infection, stiffness, patella tracking problems, thromboembolic complications and other imponderables were discussed. The patient acknowledged the explanation, agreed to proceed with the plan and consent was signed. Patient is being admitted for inpatient treatment for surgery, pain control, PT, OT, prophylactic antibiotics, VTE prophylaxis, progressive ambulation and ADL's and discharge planning. The patient is planning to be discharged home.    Anticipated LOS equal to or greater than 2 midnights due to - Age 64 and older with one or more of the following:  - Obesity  - Expected need for hospital services (PT, OT, Nursing) required for safe  discharge  - Anticipated need for postoperative skilled nursing care or inpatient rehab  - Active co-morbidities: Diabetes OR   - Unanticipated findings during/Post Surgery: None  -  Patient is a high risk of re-admission due to: None    Risks and benefits of the surgery were discussed with the patient and Dr.Aluisio at their previous office visit, and the patient has elected to move forward with the aforementioned surgery. Post-operative care plans were discussed with the patient today.  Therapy Plans: outpatient therapy at [facility] Disposition: Home with husband Planned DVT prophylaxis: Xarelto 10mg  daily DME needed: has rolling walker and cane PCP: Clyde Lundborg, PA-C  Cardio: Binnie Kand, PA-C  Clearance recevied  Other: NO BP OR IV RIGHT ARM  [employer] PA-C

## 2019-01-17 LAB — NOVEL CORONAVIRUS, NAA (HOSP ORDER, SEND-OUT TO REF LAB; TAT 18-24 HRS): SARS-CoV-2, NAA: NOT DETECTED

## 2019-01-18 MED ORDER — BUPIVACAINE LIPOSOME 1.3 % IJ SUSP
20.0000 mL | Freq: Once | INTRAMUSCULAR | Status: DC
  Filled 2019-01-18: qty 20

## 2019-01-19 ENCOUNTER — Observation Stay (HOSPITAL_COMMUNITY)
Admission: RE | Admit: 2019-01-19 | Discharge: 2019-01-21 | Disposition: A | Discharge status: Home / Self Care | Payer: Medicare Other | Source: Other Acute Inpatient Hospital | Attending: Orthopedic Surgery | Admitting: Orthopedic Surgery

## 2019-01-19 ENCOUNTER — Inpatient Hospital Stay (HOSPITAL_COMMUNITY): Payer: Medicare Other | Admitting: Anesthesiology

## 2019-01-19 ENCOUNTER — Inpatient Hospital Stay (HOSPITAL_COMMUNITY): Payer: Medicare Other | Admitting: Physician Assistant

## 2019-01-19 ENCOUNTER — Encounter (HOSPITAL_COMMUNITY)
Admission: RE | Disposition: A | Discharge status: Home / Self Care | Payer: Self-pay | Source: Other Acute Inpatient Hospital | Attending: Orthopedic Surgery

## 2019-01-19 ENCOUNTER — Other Ambulatory Visit: Payer: Self-pay

## 2019-01-19 ENCOUNTER — Encounter (HOSPITAL_COMMUNITY): Payer: Self-pay | Admitting: *Deleted

## 2019-01-19 DIAGNOSIS — Z888 Allergy status to other drugs, medicaments and biological substances status: Secondary | ICD-10-CM | POA: Diagnosis not present

## 2019-01-19 DIAGNOSIS — I959 Hypotension, unspecified: Secondary | ICD-10-CM | POA: Insufficient documentation

## 2019-01-19 DIAGNOSIS — E119 Type 2 diabetes mellitus without complications: Secondary | ICD-10-CM | POA: Insufficient documentation

## 2019-01-19 DIAGNOSIS — I517 Cardiomegaly: Secondary | ICD-10-CM | POA: Insufficient documentation

## 2019-01-19 DIAGNOSIS — I340 Nonrheumatic mitral (valve) insufficiency: Secondary | ICD-10-CM | POA: Diagnosis not present

## 2019-01-19 DIAGNOSIS — Z9221 Personal history of antineoplastic chemotherapy: Secondary | ICD-10-CM | POA: Diagnosis not present

## 2019-01-19 DIAGNOSIS — Z91040 Latex allergy status: Secondary | ICD-10-CM | POA: Diagnosis not present

## 2019-01-19 DIAGNOSIS — Z170 Estrogen receptor positive status [ER+]: Secondary | ICD-10-CM | POA: Diagnosis not present

## 2019-01-19 DIAGNOSIS — M179 Osteoarthritis of knee, unspecified: Secondary | ICD-10-CM | POA: Diagnosis present

## 2019-01-19 DIAGNOSIS — M1710 Unilateral primary osteoarthritis, unspecified knee: Secondary | ICD-10-CM | POA: Diagnosis present

## 2019-01-19 DIAGNOSIS — I10 Essential (primary) hypertension: Secondary | ICD-10-CM | POA: Insufficient documentation

## 2019-01-19 DIAGNOSIS — R0902 Hypoxemia: Secondary | ICD-10-CM

## 2019-01-19 DIAGNOSIS — Z85828 Personal history of other malignant neoplasm of skin: Secondary | ICD-10-CM | POA: Diagnosis not present

## 2019-01-19 DIAGNOSIS — E669 Obesity, unspecified: Secondary | ICD-10-CM | POA: Diagnosis not present

## 2019-01-19 DIAGNOSIS — Z923 Personal history of irradiation: Secondary | ICD-10-CM | POA: Diagnosis not present

## 2019-01-19 DIAGNOSIS — M1712 Unilateral primary osteoarthritis, left knee: Principal | ICD-10-CM | POA: Insufficient documentation

## 2019-01-19 DIAGNOSIS — Z7982 Long term (current) use of aspirin: Secondary | ICD-10-CM | POA: Insufficient documentation

## 2019-01-19 DIAGNOSIS — E785 Hyperlipidemia, unspecified: Secondary | ICD-10-CM | POA: Insufficient documentation

## 2019-01-19 DIAGNOSIS — M25762 Osteophyte, left knee: Secondary | ICD-10-CM | POA: Diagnosis not present

## 2019-01-19 DIAGNOSIS — I350 Nonrheumatic aortic (valve) stenosis: Secondary | ICD-10-CM | POA: Diagnosis not present

## 2019-01-19 DIAGNOSIS — Z6830 Body mass index (BMI) 30.0-30.9, adult: Secondary | ICD-10-CM | POA: Insufficient documentation

## 2019-01-19 DIAGNOSIS — Z79899 Other long term (current) drug therapy: Secondary | ICD-10-CM | POA: Diagnosis not present

## 2019-01-19 DIAGNOSIS — C50411 Malignant neoplasm of upper-outer quadrant of right female breast: Secondary | ICD-10-CM | POA: Diagnosis not present

## 2019-01-19 DIAGNOSIS — Z79810 Long term (current) use of selective estrogen receptor modulators (SERMs): Secondary | ICD-10-CM | POA: Insufficient documentation

## 2019-01-19 DIAGNOSIS — F17210 Nicotine dependence, cigarettes, uncomplicated: Secondary | ICD-10-CM | POA: Diagnosis not present

## 2019-01-19 HISTORY — PX: TOTAL KNEE ARTHROPLASTY: SHX125

## 2019-01-19 LAB — TYPE AND SCREEN
ABO/RH(D): A POS
Antibody Screen: NEGATIVE

## 2019-01-19 LAB — GLUCOSE, CAPILLARY: Glucose-Capillary: 120 mg/dL — ABNORMAL HIGH (ref 70–99)

## 2019-01-19 SURGERY — ARTHROPLASTY, KNEE, TOTAL
Anesthesia: Regional | Site: Knee | Laterality: Left

## 2019-01-19 MED ORDER — CEFAZOLIN SODIUM-DEXTROSE 2-4 GM/100ML-% IV SOLN
2.0000 g | Freq: Four times a day (QID) | INTRAVENOUS | Status: AC
  Administered 2019-01-19 (×2): 2 g via INTRAVENOUS
  Filled 2019-01-19 (×2): qty 100

## 2019-01-19 MED ORDER — ROPIVACAINE HCL 5 MG/ML IJ SOLN
INTRAMUSCULAR | Status: DC | PRN
  Administered 2019-01-19: 30 mL via PERINEURAL

## 2019-01-19 MED ORDER — BUPIVACAINE IN DEXTROSE 0.75-8.25 % IT SOLN
INTRATHECAL | Status: DC | PRN
  Administered 2019-01-19: 1.5 mL via INTRATHECAL

## 2019-01-19 MED ORDER — PHENYLEPHRINE 40 MCG/ML (10ML) SYRINGE FOR IV PUSH (FOR BLOOD PRESSURE SUPPORT)
PREFILLED_SYRINGE | INTRAVENOUS | Status: DC | PRN
  Administered 2019-01-19: 80 ug via INTRAVENOUS

## 2019-01-19 MED ORDER — RIVAROXABAN 10 MG PO TABS
10.0000 mg | ORAL_TABLET | Freq: Every day | ORAL | Status: DC
  Administered 2019-01-20 – 2019-01-21 (×2): 10 mg via ORAL
  Filled 2019-01-19 (×2): qty 1

## 2019-01-19 MED ORDER — DEXAMETHASONE SODIUM PHOSPHATE 10 MG/ML IJ SOLN
INTRAMUSCULAR | Status: AC
  Filled 2019-01-19: qty 1

## 2019-01-19 MED ORDER — ACETAMINOPHEN 10 MG/ML IV SOLN
INTRAVENOUS | Status: DC | PRN
  Administered 2019-01-19: 1000 mg via INTRAVENOUS

## 2019-01-19 MED ORDER — SODIUM CHLORIDE (PF) 0.9 % IJ SOLN
INTRAMUSCULAR | Status: AC
  Filled 2019-01-19: qty 10

## 2019-01-19 MED ORDER — MIDAZOLAM HCL 2 MG/2ML IJ SOLN
1.0000 mg | Freq: Once | INTRAMUSCULAR | Status: AC
  Administered 2019-01-19: 2 mg via INTRAVENOUS
  Filled 2019-01-19: qty 2

## 2019-01-19 MED ORDER — METHOCARBAMOL 500 MG IVPB - SIMPLE MED
500.0000 mg | Freq: Four times a day (QID) | INTRAVENOUS | Status: DC | PRN
  Filled 2019-01-19: qty 50

## 2019-01-19 MED ORDER — POVIDONE-IODINE 10 % EX SWAB
2.0000 "application " | Freq: Once | CUTANEOUS | Status: AC
  Administered 2019-01-19: 2 via TOPICAL

## 2019-01-19 MED ORDER — OXYCODONE HCL 5 MG PO TABS
5.0000 mg | ORAL_TABLET | ORAL | Status: DC | PRN
  Administered 2019-01-19 (×2): 5 mg via ORAL
  Administered 2019-01-19 – 2019-01-21 (×6): 10 mg via ORAL
  Filled 2019-01-19 (×6): qty 2
  Filled 2019-01-19 (×2): qty 1

## 2019-01-19 MED ORDER — FLEET ENEMA 7-19 GM/118ML RE ENEM: 1.0000 | ENEMA | Freq: Once | RECTAL | Status: DC | PRN

## 2019-01-19 MED ORDER — ONDANSETRON HCL 4 MG/2ML IJ SOLN
INTRAMUSCULAR | Status: AC
  Filled 2019-01-19: qty 2

## 2019-01-19 MED ORDER — FENTANYL CITRATE (PF) 100 MCG/2ML IJ SOLN: 25.0000 ug | INTRAMUSCULAR | Status: DC | PRN

## 2019-01-19 MED ORDER — PHENOL 1.4 % MT LIQD
1.0000 | OROMUCOSAL | Status: DC | PRN
  Filled 2019-01-19: qty 177

## 2019-01-19 MED ORDER — 0.9 % SODIUM CHLORIDE (POUR BTL) OPTIME
TOPICAL | Status: DC | PRN
  Administered 2019-01-19: 1000 mL

## 2019-01-19 MED ORDER — ONDANSETRON HCL 4 MG PO TABS: 4.0000 mg | ORAL_TABLET | Freq: Four times a day (QID) | ORAL | Status: DC | PRN

## 2019-01-19 MED ORDER — METOCLOPRAMIDE HCL 5 MG/ML IJ SOLN: 5.0000 mg | Freq: Three times a day (TID) | INTRAMUSCULAR | Status: DC | PRN

## 2019-01-19 MED ORDER — SODIUM CHLORIDE (PF) 0.9 % IJ SOLN
INTRAMUSCULAR | Status: AC
  Filled 2019-01-19: qty 50

## 2019-01-19 MED ORDER — PHENYLEPHRINE 40 MCG/ML (10ML) SYRINGE FOR IV PUSH (FOR BLOOD PRESSURE SUPPORT)
PREFILLED_SYRINGE | INTRAVENOUS | Status: AC
  Filled 2019-01-19: qty 10

## 2019-01-19 MED ORDER — POLYETHYLENE GLYCOL 3350 17 G PO PACK: 17.0000 g | PACK | Freq: Every day | ORAL | Status: DC | PRN

## 2019-01-19 MED ORDER — METHOCARBAMOL 500 MG PO TABS
500.0000 mg | ORAL_TABLET | Freq: Four times a day (QID) | ORAL | Status: DC | PRN
  Administered 2019-01-19 – 2019-01-20 (×5): 500 mg via ORAL
  Filled 2019-01-19 (×5): qty 1

## 2019-01-19 MED ORDER — SODIUM CHLORIDE 0.9 % IR SOLN
Status: DC | PRN
  Administered 2019-01-19: 1000 mL

## 2019-01-19 MED ORDER — TAMOXIFEN CITRATE 10 MG PO TABS
20.0000 mg | ORAL_TABLET | Freq: Every day | ORAL | Status: DC
  Administered 2019-01-20 – 2019-01-21 (×2): 20 mg via ORAL
  Filled 2019-01-19 (×2): qty 2

## 2019-01-19 MED ORDER — SODIUM CHLORIDE 0.9 % IV SOLN
INTRAVENOUS | Status: DC
  Administered 2019-01-19: 14:00:00 via INTRAVENOUS

## 2019-01-19 MED ORDER — PHENYLEPHRINE HCL (PRESSORS) 10 MG/ML IV SOLN
INTRAVENOUS | Status: AC
Start: 2019-01-19 — End: ?
  Filled 2019-01-19: qty 1

## 2019-01-19 MED ORDER — DOCUSATE SODIUM 100 MG PO CAPS
100.0000 mg | ORAL_CAPSULE | Freq: Two times a day (BID) | ORAL | Status: DC
  Administered 2019-01-19 – 2019-01-21 (×4): 100 mg via ORAL
  Filled 2019-01-19 (×4): qty 1

## 2019-01-19 MED ORDER — CEFAZOLIN SODIUM-DEXTROSE 2-4 GM/100ML-% IV SOLN
2.0000 g | INTRAVENOUS | Status: AC
  Administered 2019-01-19: 11:00:00 2 g via INTRAVENOUS
  Filled 2019-01-19: qty 100

## 2019-01-19 MED ORDER — ACETAMINOPHEN 500 MG PO TABS
1000.0000 mg | ORAL_TABLET | Freq: Four times a day (QID) | ORAL | Status: AC
  Administered 2019-01-19 – 2019-01-20 (×4): 1000 mg via ORAL
  Filled 2019-01-19 (×4): qty 2

## 2019-01-19 MED ORDER — DEXAMETHASONE SODIUM PHOSPHATE 10 MG/ML IJ SOLN
8.0000 mg | Freq: Once | INTRAMUSCULAR | Status: AC
  Administered 2019-01-19: 8 mg via INTRAVENOUS

## 2019-01-19 MED ORDER — LACTATED RINGERS IV SOLN
INTRAVENOUS | Status: DC
  Administered 2019-01-19 (×2): via INTRAVENOUS

## 2019-01-19 MED ORDER — STERILE WATER FOR IRRIGATION IR SOLN
Status: DC | PRN
  Administered 2019-01-19: 2000 mL

## 2019-01-19 MED ORDER — FENTANYL CITRATE (PF) 100 MCG/2ML IJ SOLN
50.0000 ug | Freq: Once | INTRAMUSCULAR | Status: DC
  Filled 2019-01-19: qty 2

## 2019-01-19 MED ORDER — ONDANSETRON HCL 4 MG/2ML IJ SOLN: 4.0000 mg | Freq: Once | INTRAMUSCULAR | Status: DC | PRN

## 2019-01-19 MED ORDER — SODIUM CHLORIDE 0.9 % IV SOLN
INTRAVENOUS | Status: DC | PRN
  Administered 2019-01-19: 40 ug/min via INTRAVENOUS

## 2019-01-19 MED ORDER — ROSUVASTATIN CALCIUM 20 MG PO TABS
20.0000 mg | ORAL_TABLET | Freq: Every day | ORAL | Status: DC
  Administered 2019-01-20: 20 mg via ORAL
  Filled 2019-01-19: qty 1

## 2019-01-19 MED ORDER — BISACODYL 10 MG RE SUPP: 10.0000 mg | Freq: Every day | RECTAL | Status: DC | PRN

## 2019-01-19 MED ORDER — MORPHINE SULFATE (PF) 2 MG/ML IV SOLN: 1.0000 mg | INTRAVENOUS | Status: DC | PRN

## 2019-01-19 MED ORDER — ONDANSETRON HCL 4 MG/2ML IJ SOLN: 4.0000 mg | Freq: Four times a day (QID) | INTRAMUSCULAR | Status: DC | PRN

## 2019-01-19 MED ORDER — TRANEXAMIC ACID-NACL 1000-0.7 MG/100ML-% IV SOLN
1000.0000 mg | INTRAVENOUS | Status: AC
  Administered 2019-01-19: 1000 mg via INTRAVENOUS
  Filled 2019-01-19: qty 100

## 2019-01-19 MED ORDER — ONDANSETRON HCL 4 MG/2ML IJ SOLN
INTRAMUSCULAR | Status: DC | PRN
  Administered 2019-01-19: 4 mg via INTRAVENOUS

## 2019-01-19 MED ORDER — METOCLOPRAMIDE HCL 5 MG PO TABS: 5.0000 mg | ORAL_TABLET | Freq: Three times a day (TID) | ORAL | Status: DC | PRN

## 2019-01-19 MED ORDER — SODIUM CHLORIDE (PF) 0.9 % IJ SOLN
INTRAMUSCULAR | Status: DC | PRN
  Administered 2019-01-19: 60 mL

## 2019-01-19 MED ORDER — TRAMADOL HCL 50 MG PO TABS
50.0000 mg | ORAL_TABLET | Freq: Four times a day (QID) | ORAL | Status: DC | PRN
  Administered 2019-01-20: 100 mg via ORAL
  Filled 2019-01-19 (×2): qty 2

## 2019-01-19 MED ORDER — METOPROLOL SUCCINATE ER 50 MG PO TB24
100.0000 mg | ORAL_TABLET | Freq: Every evening | ORAL | Status: DC
  Administered 2019-01-19 – 2019-01-20 (×2): 100 mg via ORAL
  Filled 2019-01-19 (×2): qty 2

## 2019-01-19 MED ORDER — PROPOFOL 500 MG/50ML IV EMUL
INTRAVENOUS | Status: DC | PRN
  Administered 2019-01-19: 60 ug/kg/min via INTRAVENOUS

## 2019-01-19 MED ORDER — GABAPENTIN 300 MG PO CAPS
300.0000 mg | ORAL_CAPSULE | Freq: Three times a day (TID) | ORAL | Status: DC
  Administered 2019-01-19 – 2019-01-21 (×6): 300 mg via ORAL
  Filled 2019-01-19 (×6): qty 1

## 2019-01-19 MED ORDER — MENTHOL 3 MG MT LOZG: 1.0000 | LOZENGE | OROMUCOSAL | Status: DC | PRN

## 2019-01-19 MED ORDER — BUPIVACAINE LIPOSOME 1.3 % IJ SUSP
INTRAMUSCULAR | Status: DC | PRN
  Administered 2019-01-19: 20 mL

## 2019-01-19 MED ORDER — ACETAMINOPHEN 10 MG/ML IV SOLN
1000.0000 mg | Freq: Four times a day (QID) | INTRAVENOUS | Status: DC
  Filled 2019-01-19: qty 100

## 2019-01-19 MED ORDER — DEXAMETHASONE SODIUM PHOSPHATE 10 MG/ML IJ SOLN
10.0000 mg | Freq: Once | INTRAMUSCULAR | Status: AC
  Administered 2019-01-20: 10 mg via INTRAVENOUS

## 2019-01-19 MED ORDER — PROPOFOL 500 MG/50ML IV EMUL
INTRAVENOUS | Status: AC
  Filled 2019-01-19: qty 50

## 2019-01-19 MED ORDER — CHLORHEXIDINE GLUCONATE 4 % EX LIQD: 60.0000 mL | Freq: Once | CUTANEOUS | Status: DC

## 2019-01-19 MED ORDER — DIPHENHYDRAMINE HCL 12.5 MG/5ML PO ELIX: 12.5000 mg | ORAL_SOLUTION | ORAL | Status: DC | PRN

## 2019-01-19 SURGICAL SUPPLY — 66 items
ATTUNE PS FEM LT SZ 4 CEM KNEE (Femur) ×3 IMPLANT
ATTUNE PSRP INSR SZ4 12 KNEE (Insert) ×2 IMPLANT
ATTUNE PSRP INSR SZ4 12MM KNEE (Insert) ×1 IMPLANT
BAG SPEC THK2 15X12 ZIP CLS (MISCELLANEOUS) ×1
BAG ZIPLOCK 12X15 (MISCELLANEOUS) ×3 IMPLANT
BASEPLATE TIBIAL ROTATING SZ 4 (Knees) ×3 IMPLANT
BLADE SAG 18X100X1.27 (BLADE) ×3 IMPLANT
BLADE SAW SGTL 11.0X1.19X90.0M (BLADE) ×3 IMPLANT
BLADE SURG SZ10 CARB STEEL (BLADE) IMPLANT
BNDG CMPR MED 10X6 ELC LF (GAUZE/BANDAGES/DRESSINGS) ×1
BNDG ELASTIC 6X10 VLCR STRL LF (GAUZE/BANDAGES/DRESSINGS) ×3 IMPLANT
BNDG ELASTIC 6X5.8 VLCR STR LF (GAUZE/BANDAGES/DRESSINGS) ×3 IMPLANT
BOWL SMART MIX CTS (DISPOSABLE) ×3 IMPLANT
BSPLAT TIB 4 CMNT ROT PLAT STR (Knees) ×1 IMPLANT
CEMENT HV SMART SET (Cement) ×6 IMPLANT
CLOSURE STERI-STRIP 1/2X4 (GAUZE/BANDAGES/DRESSINGS) ×2
CLOSURE WOUND 1/2 X4 (GAUZE/BANDAGES/DRESSINGS) ×2
CLSR STERI-STRIP ANTIMIC 1/2X4 (GAUZE/BANDAGES/DRESSINGS) ×4 IMPLANT
COVER SURGICAL LIGHT HANDLE (MISCELLANEOUS) ×3 IMPLANT
COVER WAND RF STERILE (DRAPES) ×3 IMPLANT
CUFF TOURN SGL QUICK 34 (TOURNIQUET CUFF) ×3
CUFF TRNQT CYL 34X4.125X (TOURNIQUET CUFF) ×1 IMPLANT
DECANTER SPIKE VIAL GLASS SM (MISCELLANEOUS) ×6 IMPLANT
DRAPE U-SHAPE 47X51 STRL (DRAPES) ×3 IMPLANT
DRSG ADAPTIC 3X8 NADH LF (GAUZE/BANDAGES/DRESSINGS) ×3 IMPLANT
DRSG PAD ABDOMINAL 8X10 ST (GAUZE/BANDAGES/DRESSINGS) ×3 IMPLANT
DURAPREP 26ML APPLICATOR (WOUND CARE) ×3 IMPLANT
ELECT REM PT RETURN 15FT ADLT (MISCELLANEOUS) ×3 IMPLANT
EVACUATOR 1/8 PVC DRAIN (DRAIN) ×3 IMPLANT
GAUZE SPONGE 4X4 12PLY STRL (GAUZE/BANDAGES/DRESSINGS) ×3 IMPLANT
GLOVE BIO SURGEON STRL SZ7 (GLOVE) ×3 IMPLANT
GLOVE BIO SURGEON STRL SZ8 (GLOVE) ×3 IMPLANT
GLOVE BIOGEL PI IND STRL 6.5 (GLOVE) ×1 IMPLANT
GLOVE BIOGEL PI IND STRL 7.0 (GLOVE) ×1 IMPLANT
GLOVE BIOGEL PI IND STRL 8 (GLOVE) ×1 IMPLANT
GLOVE BIOGEL PI INDICATOR 6.5 (GLOVE) ×2
GLOVE BIOGEL PI INDICATOR 7.0 (GLOVE) ×2
GLOVE BIOGEL PI INDICATOR 8 (GLOVE) ×2
GLOVE SURG SS PI 6.5 STRL IVOR (GLOVE) ×3 IMPLANT
GOWN STRL REUS W/TWL LRG LVL3 (GOWN DISPOSABLE) ×9 IMPLANT
HANDPIECE INTERPULSE COAX TIP (DISPOSABLE) ×3
HOLDER FOLEY CATH W/STRAP (MISCELLANEOUS) ×3 IMPLANT
IMMOBILIZER KNEE 20 (SOFTGOODS) ×3
IMMOBILIZER KNEE 20 THIGH 36 (SOFTGOODS) ×1 IMPLANT
KIT TURNOVER KIT A (KITS) IMPLANT
MANIFOLD NEPTUNE II (INSTRUMENTS) ×3 IMPLANT
NS IRRIG 1000ML POUR BTL (IV SOLUTION) ×3 IMPLANT
PACK TOTAL KNEE CUSTOM (KITS) ×3 IMPLANT
PADDING CAST COTTON 6X4 STRL (CAST SUPPLIES) ×6 IMPLANT
PATELLA MEDIAL ATTUN 35MM KNEE (Knees) ×3 IMPLANT
PIN DRILL FIX HALF THREAD (BIT) ×3 IMPLANT
PIN STEINMAN FIXATION KNEE (PIN) ×3 IMPLANT
PROTECTOR NERVE ULNAR (MISCELLANEOUS) ×3 IMPLANT
SET HNDPC FAN SPRY TIP SCT (DISPOSABLE) ×1 IMPLANT
STRIP CLOSURE SKIN 1/2X4 (GAUZE/BANDAGES/DRESSINGS) ×4 IMPLANT
SUT MNCRL AB 4-0 PS2 18 (SUTURE) ×3 IMPLANT
SUT STRATAFIX 0 PDS 27 VIOLET (SUTURE) ×3
SUT VIC AB 2-0 CT1 27 (SUTURE) ×9
SUT VIC AB 2-0 CT1 TAPERPNT 27 (SUTURE) ×3 IMPLANT
SUTURE STRATFX 0 PDS 27 VIOLET (SUTURE) ×1 IMPLANT
TRAY FOL W/BAG SLVR 16FR STRL (SET/KITS/TRAYS/PACK) ×1 IMPLANT
TRAY FOLEY MTR SLVR 16FR STAT (SET/KITS/TRAYS/PACK) IMPLANT
TRAY FOLEY W/BAG SLVR 16FR LF (SET/KITS/TRAYS/PACK) ×3
WATER STERILE IRR 1000ML POUR (IV SOLUTION) ×6 IMPLANT
WRAP KNEE MAXI GEL POST OP (GAUZE/BANDAGES/DRESSINGS) ×3 IMPLANT
YANKAUER SUCT BULB TIP 10FT TU (MISCELLANEOUS) ×3 IMPLANT

## 2019-01-19 NOTE — Interval H&P Note (Signed)
History and Physical Interval Note:  01/19/2019 8:38 AM  Darlene Kelly  has presented today for surgery, with the diagnosis of left knee osteoarthritis.  The various methods of treatment have been discussed with the patient and family. After consideration of risks, benefits and other options for treatment, the patient has consented to  Procedure(s) with comments: TOTAL KNEE ARTHROPLASTY (Left) - 68min as a surgical intervention.  The patient's history has been reviewed, patient examined, no change in status, stable for surgery.  I have reviewed the patient's chart and labs.  Questions were answered to the patient's satisfaction.     Pilar Plate Chery Giusto

## 2019-01-19 NOTE — Transfer of Care (Signed)
Immediate Anesthesia Transfer of Care Note  Patient: Darlene Kelly  Procedure(s) Performed: TOTAL KNEE ARTHROPLASTY (Left Knee)  Patient Location: PACU  Anesthesia Type:Regional and Spinal  Level of Consciousness: sedated  Airway & Oxygen Therapy: Patient Spontanous Breathing and Patient connected to face mask oxygen  Post-op Assessment: Report given to RN and Post -op Vital signs reviewed and stable  Post vital signs: Reviewed and stable  Last Vitals:  Vitals Value Taken Time  BP    Temp    Pulse 64 01/19/19 1149  Resp 23 01/19/19 1149  SpO2 98 % 01/19/19 1149  Vitals shown include unvalidated device data.  Last Pain:  Vitals:   01/19/19 1016  TempSrc:   PainSc: 0-No pain         Complications: No apparent anesthesia complications

## 2019-01-19 NOTE — Anesthesia Procedure Notes (Signed)
Spinal  Patient location during procedure: OR Start time: 01/19/2019 10:30 AM End time: 01/19/2019 10:40 AM Staffing Anesthesiologist: Murvin Natal, MD Performed: anesthesiologist  Preanesthetic Checklist Completed: patient identified, surgical consent, pre-op evaluation, timeout performed, IV checked, risks and benefits discussed and monitors and equipment checked Spinal Block Patient position: sitting Prep: DuraPrep Patient monitoring: cardiac monitor, continuous pulse ox and blood pressure Approach: midline Location: L4-5 Injection technique: single-shot Needle Needle type: Pencan  Needle gauge: 24 G Needle length: 9 cm Assessment Sensory level: T10 Additional Notes Functioning IV was confirmed and monitors were applied. Sterile prep and drape, including hand hygiene and sterile gloves were used. The patient was positioned and the spine was prepped. The skin was anesthetized with lidocaine.  Free flow of clear CSF was obtained prior to injecting local anesthetic into the CSF.  The spinal needle aspirated freely following injection.  The needle was carefully withdrawn.  The patient tolerated the procedure well.

## 2019-01-19 NOTE — Evaluation (Signed)
Physical Therapy Evaluation Patient Details Name: Darlene Kelly MRN: [id number] DOB: 1950/12/23 Today's Date: 01/19/2019   History of Present Illness  Patient is 68 y.o. 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  Clinical Impression  Darlene Kelly is a 68 y.o. female POD 0 s/p Lt TKA. Patient reports independence with mobility at baseline. Patient is now limited by functional impairments (see PT problem list below) and requires min assist/guard for transfers and gait with RW. Patient was able to ambulate ~60 feet with RW and was limited with distance by lightheadedness, symptoms resolved with seated rest. Patient instructed in exercise to facilitate ROM and circulation to manage edema. Patient will benefit from continued skilled PT interventions to address impairments and progress towards PLOF. Acute PT will follow to progress mobility and stair training in preparation for safe discharge home.     Follow Up Recommendations Follow surgeon's recommendation for DC plan and follow-up therapies    Equipment Recommendations  None recommended by PT    Recommendations for Other Services       Precautions / Restrictions Precautions Precautions: Fall Restrictions Weight Bearing Restrictions: No      Mobility  Bed Mobility Overal bed mobility: Needs Assistance Bed Mobility: Supine to Sit     Supine to sit: Supervision;HOB elevated     General bed mobility comments: pt using bed rails, no cues needed  Transfers Overall transfer level: Needs assistance Equipment used: Rolling walker (2 wheeled)(youth) Transfers: Sit to/from Stand Sit to Stand: Min assist         General transfer comment: cues for safe hand placement and technique with RW, assist for power up and to steady with rising  Ambulation/Gait Ambulation/Gait assistance: Min guard Gait Distance (Feet): 60 Feet Assistive device: Rolling walker (2 wheeled) Gait  Pattern/deviations: Step-through pattern;Decreased stride length;Decreased step length - right;Decreased step length - left;Decreased stance time - left;Decreased weight shift to left Gait velocity: decreased   General Gait Details: pt maintained safe hand placement and proximity to RW throughout, no overt LOB noted, pt reported lightheadedness after amb ~60 ft and was provided seated rest break, symptoms resolved after being reclined ~ 1 minute  Stairs            Wheelchair Mobility    Modified Rankin (Stroke Patients Only)       Balance Overall balance assessment: Needs assistance Sitting-balance support: Feet supported;No upper extremity supported Sitting balance-Leahy Scale: Good     Standing balance support: During functional activity;Bilateral upper extremity supported Standing balance-Leahy Scale: Fair                Pertinent Vitals/Pain Pain Assessment: 0-10 Pain Score: 6  Pain Location: Lt Knee Pain Descriptors / Indicators: Aching;Sore;Burning Pain Intervention(s): Limited activity within patient's tolerance;Monitored during session;Repositioned    Home Living Family/patient expects to be discharged to:: Private residence Living Arrangements: Spouse/significant other Available Help at Discharge: Family Type of Home: House Home Access: Stairs to enter Entrance Stairs-Rails: Can reach both;Right;Left Entrance Stairs-Number of Steps: 2-3 Home Layout: One level Home Equipment: Environmental consultant - 2 wheels;Walker - 4 wheels;Kasandra Knudsen - single point Additional Comments: pt reports the walkers at home were her mothers and her mother was under 5' tall so they should be the right size for her    Prior Function Level of Independence: Independent               Hand Dominance   Dominant Hand: Right  Extremity/Trunk Assessment   Upper Extremity Assessment Upper Extremity Assessment: Overall WFL for tasks assessed    Lower Extremity Assessment Lower Extremity  Assessment: Overall WFL for tasks assessed;LLE deficits/detail LLE Deficits / Details: pt with good quad contraction in supine and no extensor lag with SLR, 4/5 or greater for quad with LAQ testing LLE Sensation: WNL LLE Coordination: WNL    Cervical / Trunk Assessment Cervical / Trunk Assessment: Normal  Communication   Communication: No difficulties  Cognition Arousal/Alertness: Awake/alert Behavior During Therapy: WFL for tasks assessed/performed Overall Cognitive Status: Within Functional Limits for tasks assessed               General Comments      Exercises Total Joint Exercises Ankle Circles/Pumps: AROM;10 reps;Seated;Both Quad Sets: AROM;10 reps;Supine;Left   Assessment/Plan    PT Assessment Patient needs continued PT services  PT Problem List Decreased balance;Decreased strength;Decreased range of motion;Decreased mobility;Decreased activity tolerance       PT Treatment Interventions DME instruction;Functional mobility training;Balance training;Patient/family education;Modalities;Therapeutic activities;Gait training;Stair training;Therapeutic exercise    PT Goals (Current goals can be found in the Care Plan section)  Acute Rehab PT Goals Patient Stated Goal: get back to walking with no device PT Goal Formulation: With patient Time For Goal Achievement: 01/26/19 Potential to Achieve Goals: Good    Frequency 7X/week    AM-PAC PT "6 Clicks" Mobility  Outcome Measure Help needed turning from your back to your side while in a flat bed without using bedrails?: A Little Help needed moving from lying on your back to sitting on the side of a flat bed without using bedrails?: A Little Help needed moving to and from a bed to a chair (including a wheelchair)?: A Little Help needed standing up from a chair using your arms (e.g., wheelchair or bedside chair)?: A Little Help needed to walk in hospital room?: A Little Help needed climbing 3-5 steps with a railing? : A  Little 6 Click Score: 18    End of Session Equipment Utilized During Treatment: Gait belt Activity Tolerance: Patient tolerated treatment well Patient left: in chair;with call bell/phone within reach;with family/visitor present;with chair alarm set Nurse Communication: Mobility status PT Visit Diagnosis: Difficulty in walking, not elsewhere classified (R26.2);Muscle weakness (generalized) (M62.81)    Time: [id number] PT Time Calculation (min) (ACUTE ONLY): 36 min   Charges:   PT Evaluation $PT Eval Low Complexity: 1 Low PT Treatments $Therapeutic Exercise: 8-22 mins        Kipp Brood, PT, DPT Physical Therapist with [facility]  01/19/2019 4:04 PM

## 2019-01-19 NOTE — Plan of Care (Signed)
Plan of care 

## 2019-01-19 NOTE — Anesthesia Procedure Notes (Signed)
Anesthesia Regional Block: Adductor canal block   Pre-Anesthetic Checklist: ,, timeout performed, Correct Patient, Correct Site, Correct Laterality, Correct Procedure,, site marked, risks and benefits discussed, Surgical consent,  Pre-op evaluation,  At surgeon's request and post-op pain management  Laterality: Left  Prep: chloraprep       Needles:  Injection technique: Single-shot  Needle Type: Echogenic Stimulator Needle     Needle Length: 9cm  Needle Gauge: 21     Additional Needles:   Procedures:,,,, ultrasound used (permanent image in chart),,,,  Narrative:  Start time: 01/19/2019 9:35 AM End time: 01/19/2019 9:45 AM Injection made incrementally with aspirations every 5 mL.  Performed by: Personally  Anesthesiologist: Murvin Natal, MD  Additional Notes: Functioning IV was confirmed and monitors were applied. A time-out was performed. Hand hygiene and sterile gloves were used. The thigh was placed in a frog-leg position and prepped in a sterile fashion. A 48mm 21ga Arrow echogenic stimulator needle was placed using ultrasound guidance.  Negative aspiration and negative test dose prior to incremental administration of local anesthetic. The patient tolerated the procedure well.

## 2019-01-19 NOTE — Progress Notes (Signed)
Assisted Dr. Ellender with left, ultrasound guided, adductor canal block. Side rails up, monitors on throughout procedure. See vital signs in flow sheet. Tolerated Procedure well.  

## 2019-01-19 NOTE — Anesthesia Postprocedure Evaluation (Signed)
Anesthesia Post Note  Patient: CHRISLEY ABBAS  Procedure(s) Performed: TOTAL KNEE ARTHROPLASTY (Left Knee)     Patient location during evaluation: PACU Anesthesia Type: Regional and Spinal Level of consciousness: oriented and awake and alert Pain management: pain level controlled Vital Signs Assessment: post-procedure vital signs reviewed and stable Respiratory status: spontaneous breathing, respiratory function stable and patient connected to nasal cannula oxygen Cardiovascular status: blood pressure returned to baseline and stable Postop Assessment: no headache, no backache and no apparent nausea or vomiting Anesthetic complications: no    Last Vitals:  Vitals:   01/19/19 1627 01/19/19 1806  BP: (!) 109/51 116/67  Pulse: 66 70  Resp: 16 15  Temp: 36.8 C 37.2 C  SpO2: 100% 98%    Last Pain:  Vitals:   01/19/19 1806  TempSrc: Oral  PainSc:                  Shalisa Mcquade P Keyly Baldonado

## 2019-01-19 NOTE — Op Note (Signed)
OPERATIVE REPORT-TOTAL KNEE ARTHROPLASTY   Pre-operative diagnosis- Osteoarthritis  Left knee(s)  Post-operative diagnosis- Osteoarthritis Left knee(s)  Procedure-  Left  Total Knee Arthroplasty  Surgeon- Dione Plover. Nadine Ryle, MD  Assistant- Ardeen Jourdain, PA-C   Anesthesia-  Adductor canal block and spinal  EBL-100 mL   Drains Hemovac  Tourniquet time-  Total Tourniquet Time Documented: Thigh (Left) - 32 minutes Total: Thigh (Left) - 32 minutes     Complications- None  Condition-PACU - hemodynamically stable.   Brief Clinical Note  Darlene Kelly is a 68 y.o. year old female with end stage OA of her left knee with progressively worsening pain and dysfunction. She has constant pain, with activity and at rest and significant functional deficits with difficulties even with ADLs. She has had extensive non-op management including analgesics, injections of cortisone and viscosupplements, and home exercise program, but remains in significant pain with significant dysfunction. Radiographs show bone on bone arthritis medial and patellofemoral. She presents now for left Total Knee Arthroplasty.    Procedure in detail---   The patient is brought into the operating room and positioned supine on the operating table. After successful administration of   Adductor canal block and spinal,   a tourniquet is placed high on the  Left thigh(s) and the lower extremity is prepped and draped in the usual sterile fashion. Time out is performed by the operating team and then the  Left lower extremity is wrapped in Esmarch, knee flexed and the tourniquet inflated to 300 mmHg.       A midline incision is made with a ten blade through the subcutaneous tissue to the level of the extensor mechanism. A fresh blade is used to make a medial parapatellar arthrotomy. Soft tissue over the proximal medial tibia is subperiosteally elevated to the joint line with a knife and into the semimembranosus bursa with a Cobb  elevator. Soft tissue over the proximal lateral tibia is elevated with attention being paid to avoiding the patellar tendon on the tibial tubercle. The patella is everted, knee flexed 90 degrees and the ACL and PCL are removed. Findings are bone on bone medial and patellofemoral with large global osteophytes        The drill is used to create a starting hole in the distal femur and the canal is thoroughly irrigated with sterile saline to remove the fatty contents. The 5 degree Left  valgus alignment guide is placed into the femoral canal and the distal femoral cutting block is pinned to remove 9 mm off the distal femur. Resection is made with an oscillating saw.      The tibia is subluxed forward and the menisci are removed. The extramedullary alignment guide is placed referencing proximally at the medial aspect of the tibial tubercle and distally along the second metatarsal axis and tibial crest. The block is pinned to remove 86mm off the more deficient medial  side. Resection is made with an oscillating saw. Size 4is the most appropriate size for the tibia and the proximal tibia is prepared with the modular drill and keel punch for that size.      The femoral sizing guide is placed and size 4 is most appropriate. Rotation is marked off the epicondylar axis and confirmed by creating a rectangular flexion gap at 90 degrees. The size 4 cutting block is pinned in this rotation and the anterior, posterior and chamfer cuts are made with the oscillating saw. The intercondylar block is then placed and that cut is made.  Trial size 4 tibial component, trial size 4 posterior stabilized femur and a 12  mm posterior stabilized rotating platform insert trial is placed. Full extension is achieved with excellent varus/valgus and anterior/posterior balance throughout full range of motion. The patella is everted and thickness measured to be 22  mm. Free hand resection is taken to 12 mm, a 35 template is placed, lug holes  are drilled, trial patella is placed, and it tracks normally. Osteophytes are removed off the posterior femur with the trial in place. All trials are removed and the cut bone surfaces prepared with pulsatile lavage. Cement is mixed and once ready for implantation, the size 4 tibial implant, size  4 posterior stabilized femoral component, and the size 35 patella are cemented in place and the patella is held with the clamp. The trial insert is placed and the knee held in full extension. The Exparel (20 ml mixed with 60 ml saline) is injected into the extensor mechanism, posterior capsule, medial and lateral gutters and subcutaneous tissues.  All extruded cement is removed and once the cement is hard the permanent 12 mm posterior stabilized rotating platform insert is placed into the tibial tray.      The wound is copiously irrigated with saline solution and the extensor mechanism closed over a hemovac drain with #1 V-loc suture. The tourniquet is released for a total tourniquet time of 32  minutes. Flexion against gravity is 140 degrees and the patella tracks normally. Subcutaneous tissue is closed with 2.0 vicryl and subcuticular with running 4.0 Monocryl. The incision is cleaned and dried and steri-strips and a bulky sterile dressing are applied. The limb is placed into a knee immobilizer and the patient is awakened and transported to recovery in stable condition.      Please note that a surgical assistant was a medical necessity for this procedure in order to perform it in a safe and expeditious manner. Surgical assistant was necessary to retract the ligaments and vital neurovascular structures to prevent injury to them and also necessary for proper positioning of the limb to allow for anatomic placement of the prosthesis.   Dione Plover Griselle Rufer, MD    01/19/2019, 11:39 AM

## 2019-01-19 NOTE — Discharge Instructions (Addendum)
° °Dr. Frank Aluisio °Total Joint Specialist °[facility] °[address] °[place], [place] [address] °[phone] ° °TOTAL KNEE REPLACEMENT POSTOPERATIVE DIRECTIONS ° °Knee Rehabilitation, Guidelines Following Surgery  °Results after knee surgery are often greatly improved when you follow the exercise, range of motion and muscle strengthening exercises prescribed by your doctor. Safety measures are also important to protect the knee from further injury. Any time any of these exercises cause you to have increased pain or swelling in your knee joint, decrease the amount until you are comfortable again and slowly increase them. If you have problems or questions, call your caregiver or physical therapist for advice.  ° °HOME CARE INSTRUCTIONS  °Remove items at home which could result in a fall. This includes throw rugs or furniture in walking pathways.  °· ICE to the affected knee every three hours for 30 minutes at a time and then as needed for pain and swelling.  Continue to use ice on the knee for pain and swelling from surgery. You may notice swelling that will progress down to the foot and ankle.  This is normal after surgery.  Elevate the leg when you are not up walking on it.   °· Continue to use the breathing machine which will help keep your temperature down.  It is common for your temperature to cycle up and down following surgery, especially at night when you are not up moving around and exerting yourself.  The breathing machine keeps your lungs expanded and your temperature down. °· Do not place pillow under knee, focus on keeping the knee straight while resting ° °DIET °You may resume your previous home diet once your are discharged from the hospital. ° °DRESSING / WOUND CARE / SHOWERING °You may shower 3 days after surgery, but keep the wounds dry during showering.  You may use an occlusive plastic wrap (Press'n Seal for example), NO SOAKING/SUBMERGING IN THE BATHTUB.  If the bandage gets  wet, change with a clean dry gauze.  If the incision gets wet, pat the wound dry with a clean towel. °You may start showering once you are discharged home but do not submerge the incision under water. Just pat the incision dry and apply a dry gauze dressing on daily. °Change the surgical dressing daily and reapply a dry dressing each time. ° °ACTIVITY °Walk with your walker as instructed. °Use walker as long as suggested by your caregivers. °Avoid periods of inactivity such as sitting longer than an hour when not asleep. This helps prevent blood clots.  °You may resume a sexual relationship in one month or when given the OK by your doctor.  °You may return to work once you are cleared by your doctor.  °Do not drive a car for 6 weeks or until released by you surgeon.  °Do not drive while taking narcotics. ° °WEIGHT BEARING °Weight bearing as tolerated with assist device (walker, cane, etc) as directed, use it as long as suggested by your surgeon or therapist, typically at least 4-6 weeks. ° °POSTOPERATIVE CONSTIPATION PROTOCOL °Constipation - defined medically as fewer than three stools per week and severe constipation as less than one stool per week. ° °One of the most common issues patients have following surgery is constipation.  Even if you have a regular bowel pattern at home, your normal regimen is likely to be disrupted due to multiple reasons following surgery.  Combination of anesthesia, postoperative narcotics, change in appetite and fluid intake all can affect your bowels.    In order to avoid complications following surgery, here are some recommendations in order to help you during your recovery period.  Colace (docusate) - Pick up an over-the-counter form of Colace or another stool softener and take twice a day as long as you are requiring postoperative pain medications.  Take with a full glass of water daily.  If you experience loose stools or diarrhea, hold the colace until you stool forms back up.  If  your symptoms do not get better within 1 week or if they get worse, check with your doctor.  Dulcolax (bisacodyl) - Pick up over-the-counter and take as directed by the product packaging as needed to assist with the movement of your bowels.  Take with a full glass of water.  Use this product as needed if not relieved by Colace only.   MiraLax (polyethylene glycol) - Pick up over-the-counter to have on hand.  MiraLax is a solution that will increase the amount of water in your bowels to assist with bowel movements.  Take as directed and can mix with a glass of water, juice, soda, coffee, or tea.  Take if you go more than two days without a movement. Do not use MiraLax more than once per day. Call your doctor if you are still constipated or irregular after using this medication for 7 days in a row.  If you continue to have problems with postoperative constipation, please contact the office for further assistance and recommendations.  If you experience "the worst abdominal pain ever" or develop nausea or vomiting, please contact the office immediatly for further recommendations for treatment.  ITCHING  If you experience itching with your medications, try taking only a single pain pill, or even half a pain pill at a time.  You can also use Benadryl over the counter for itching or also to help with sleep.   TED HOSE STOCKINGS Wear the elastic stockings on both legs for three weeks following surgery during the day but you may remove then at night for sleeping.  MEDICATIONS See your medication summary on the After Visit Summary that the nursing staff will review with you prior to discharge.  You may have some home medications which will be placed on hold until you complete the course of blood thinner medication.  It is important for you to complete the blood thinner medication as prescribed by your surgeon.  Continue your approved medications as instructed at time of discharge.  Gabapentin 300 mg  Protocol Take a 300 mg capsule three times a day for two weeks, Then a 300 mg capsule twice a day for two weeks, Then a 300 mg capsule once a day for two weeks, then discontinue the Gabapentin.  Information on my medicine - XARELTO (Rivaroxaban)  Why was Xarelto prescribed for you? Xarelto was prescribed for you to reduce the risk of blood clots forming after orthopedic surgery. The medical term for these abnormal blood clots is venous thromboembolism (VTE).  What do you need to know about xarelto ? Take your Xarelto ONCE DAILY at the same time every day. You may take it either with or without food.  If you have difficulty swallowing the tablet whole, you may crush it and mix in applesauce just prior to taking your dose.  Take Xarelto exactly as prescribed by your doctor and DO NOT stop taking Xarelto without talking to the doctor who prescribed the medication.  Stopping without other VTE prevention medication to take the place of Xarelto may increase your risk  of developing a clot.  After discharge, you should have regular check-up appointments with your healthcare provider that is prescribing your Xarelto.    What do you do if you miss a dose? If you miss a dose, take it as soon as you remember on the same day then continue your regularly scheduled once daily regimen the next day. Do not take two doses of Xarelto on the same day.   Important Safety Information A possible side effect of Xarelto is bleeding. You should call your healthcare provider right away if you experience any of the following: ? Bleeding from an injury or your nose that does not stop. ? Unusual colored urine (red or dark brown) or unusual colored stools (red or black). ? Unusual bruising for unknown reasons. ? A serious fall or if you hit your head (even if there is no bleeding).  Some medicines may interact with Xarelto and might increase your risk of bleeding while on Xarelto. To help avoid this,  consult your healthcare provider or pharmacist prior to using any new prescription or non-prescription medications, including herbals, vitamins, non-steroidal anti-inflammatory drugs (NSAIDs) and supplements.  This website has more information on Xarelto: [email or web address]   PRECAUTIONS If you experience chest pain or shortness of breath - call 911 immediately for transfer to the hospital emergency department.  If you develop a fever greater that 101 F, purulent drainage from wound, increased redness or drainage from wound, foul odor from the wound/dressing, or calf pain - CONTACT YOUR SURGEON.                                                   FOLLOW-UP APPOINTMENTS Make sure you keep all of your appointments after your operation with your surgeon and caregivers. You should call the office at the above phone number and make an appointment for approximately two weeks after the date of your surgery or on the date instructed by your surgeon outlined in the "After Visit Summary".   RANGE OF MOTION AND STRENGTHENING EXERCISES  Rehabilitation of the knee is important following a knee injury or an operation. After just a few days of immobilization, the muscles of the thigh which control the knee become weakened and shrink (atrophy). Knee exercises are designed to build up the tone and strength of the thigh muscles and to improve knee motion. Often times heat used for twenty to thirty minutes before working out will loosen up your tissues and help with improving the range of motion but do not use heat for the first two weeks following surgery. These exercises can be done on a training (exercise) mat, on the floor, on a table or on a bed. Use what ever works the best and is most comfortable for you Knee exercises include:  Leg Lifts - While your knee is still immobilized in a splint or cast, you can do straight leg raises. Lift the leg to 60 degrees, hold for 3 sec, and slowly lower the leg. Repeat 10-20 times  2-3 times daily. Perform this exercise against resistance later as your knee gets better.  Quad and Hamstring Sets - Tighten up the muscle on the front of the thigh (Quad) and hold for 5-10 sec. Repeat this 10-20 times hourly. Hamstring sets are done by pushing the foot backward against an object and holding for 5-10 sec. Repeat as  with quad sets.   Leg Slides: Lying on your back, slowly slide your foot toward your buttocks, bending your knee up off the floor (only go as far as is comfortable). Then slowly slide your foot back down until your leg is flat on the floor again.  Angel Wings: Lying on your back spread your legs to the side as far apart as you can without causing discomfort.  A rehabilitation program following serious knee injuries can speed recovery and prevent re-injury in the future due to weakened muscles. Contact your doctor or a physical therapist for more information on knee rehabilitation.   IF YOU ARE TRANSFERRED TO A SKILLED REHAB FACILITY If the patient is transferred to a skilled rehab facility following release from the hospital, a list of the current medications will be sent to the facility for the patient to continue.  When discharged from the skilled rehab facility, please have the facility set up the patient's [place] prior to being released. Also, the skilled facility will be responsible for providing the patient with their medications at time of release from the facility to include their pain medication, the muscle relaxants, and their blood thinner medication. If the patient is still at the rehab facility at time of the two week follow up appointment, the skilled rehab facility will also need to assist the patient in arranging follow up appointment in our office and any transportation needs.  MAKE SURE YOU:  Understand these instructions.  Get help right away if you are not doing well or get worse.    Pick up stool softner and laxative for home use  following surgery while on pain medications. Do not submerge incision under water. Please use good hand washing techniques while changing dressing each day. May shower starting three days after surgery. Please use a clean towel to pat the incision dry following showers. Continue to use ice for pain and swelling after surgery. Do not use any lotions or creams on the incision until instructed by your surgeon.

## 2019-01-20 ENCOUNTER — Encounter (HOSPITAL_COMMUNITY): Payer: Self-pay | Admitting: Orthopedic Surgery

## 2019-01-20 DIAGNOSIS — M1712 Unilateral primary osteoarthritis, left knee: Secondary | ICD-10-CM | POA: Diagnosis not present

## 2019-01-20 LAB — CBC
HCT: 32.1 % — ABNORMAL LOW (ref 36.0–46.0)
Hemoglobin: 10.5 g/dL — ABNORMAL LOW (ref 12.0–15.0)
MCH: 30.9 pg (ref 26.0–34.0)
MCHC: 32.7 g/dL (ref 30.0–36.0)
MCV: 94.4 fL (ref 80.0–100.0)
Platelets: 161 10*3/uL (ref 150–400)
RBC: 3.4 MIL/uL — ABNORMAL LOW (ref 3.87–5.11)
RDW: 12.6 % (ref 11.5–15.5)
WBC: 9.6 10*3/uL (ref 4.0–10.5)
nRBC: 0 % (ref 0.0–0.2)

## 2019-01-20 LAB — BASIC METABOLIC PANEL
Anion gap: 8 (ref 5–15)
BUN: 12 mg/dL (ref 8–23)
CO2: 23 mmol/L (ref 22–32)
Calcium: 8.6 mg/dL — ABNORMAL LOW (ref 8.9–10.3)
Chloride: 105 mmol/L (ref 98–111)
Creatinine, Ser: 0.64 mg/dL (ref 0.44–1.00)
GFR calc Af Amer: >60 mL/min (ref >60)
GFR calc non Af Amer: >60 mL/min (ref >60)
Glucose, Bld: 171 mg/dL — ABNORMAL HIGH (ref 70–99)
Potassium: 4.1 mmol/L (ref 3.5–5.1)
Sodium: 136 mmol/L (ref 135–145)

## 2019-01-20 MED ORDER — SODIUM CHLORIDE 0.9 % IV BOLUS
250.0000 mL | Freq: Once | INTRAVENOUS | Status: AC
  Administered 2019-01-20: 250 mL via INTRAVENOUS

## 2019-01-20 MED ORDER — TRAMADOL HCL 50 MG PO TABS: 50.0000 mg | ORAL_TABLET | Freq: Four times a day (QID) | ORAL | 0 refills | Status: DC | PRN

## 2019-01-20 MED ORDER — GABAPENTIN 300 MG PO CAPS: 300.0000 mg | ORAL_CAPSULE | Freq: Three times a day (TID) | ORAL | 0 refills | Status: AC

## 2019-01-20 MED ORDER — METHOCARBAMOL 500 MG PO TABS: 500.0000 mg | ORAL_TABLET | Freq: Four times a day (QID) | ORAL | 0 refills | Status: DC | PRN

## 2019-01-20 MED ORDER — OXYCODONE HCL 5 MG PO TABS: 5.0000 mg | ORAL_TABLET | Freq: Four times a day (QID) | ORAL | 0 refills | Status: DC | PRN

## 2019-01-20 MED ORDER — RIVAROXABAN 10 MG PO TABS: 10.0000 mg | ORAL_TABLET | Freq: Every day | ORAL | 0 refills | Status: DC

## 2019-01-20 NOTE — Progress Notes (Signed)
Physical Therapy Treatment Patient Details Name: Darlene Kelly MRN: [id number] DOB: 07-14-1950 Today's Date: 01/20/2019    History of Present Illness Patient is 68 y.o. 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    PT Comments    Pt continues to c/o fatigue and lightheadedness with ambulation. BP 94/36 immediately after ambulating this afternoon. Pt did not feel able to attempt stair negotiation training. Unfortunately, pt has not been able to meet her PT goals in order to d/c home today. Made RN aware. Will continue to follow and progress activity as tolerated.     Follow Up Recommendations  Follow surgeon's recommendation for DC plan and follow-up therapies     Equipment Recommendations  None recommended by PT    Recommendations for Other Services       Precautions / Restrictions Precautions Precautions: Fall Restrictions Weight Bearing Restrictions: No Other Position/Activity Restrictions: WBAT    Mobility  Bed Mobility Overal bed mobility: Needs Assistance Bed Mobility: Sit to Supine       Sit to supine: Min assist;HOB elevated   General bed mobility comments: Assist for L LE.  Transfers Overall transfer level: Needs assistance Equipment used: Rolling walker (2 wheeled) Transfers: Sit to/from Stand Sit to Stand: Min assist         General transfer comment: Assist to rise, stabilize, control descent. VCs safety, technique, hand placement  Ambulation/Gait Ambulation/Gait assistance: Min assist;+2 safety/equipment Gait Distance (Feet): 85 Feet Assistive device: Rolling walker (2 wheeled) Gait Pattern/deviations: Step-to pattern;Antalgic     General Gait Details: Cues for safety. Slow gait speed with a few brief standing rest breaks. Pt c/o pain, fatigue, and then lightheadedness towards end of ambulation distance. BP 94/36. Pt was transported back to her room in [facility]    Modified Rankin (Stroke Patients Only)       Balance Overall balance assessment: Needs assistance         Standing balance support: Bilateral upper extremity supported Standing balance-Leahy Scale: Poor                              Cognition Arousal/Alertness: Awake/alert Behavior During Therapy: WFL for tasks assessed/performed Overall Cognitive Status: Within Functional Limits for tasks assessed                                        Exercises      General Comments        Pertinent Vitals/Pain Pain Assessment: 0-10 Pain Score: 7  Pain Location: L Knee Pain Descriptors / Indicators: Aching;Discomfort;Sore Pain Intervention(s): Monitored during session;Repositioned    Home Living                      Prior Function            PT Goals (current goals can now be found in the care plan section) Progress towards PT goals: Progressing toward goals    Frequency    7X/week      PT Plan Current plan remains appropriate    Co-evaluation              AM-PAC PT "6 Clicks" Mobility   Outcome Measure  Help needed turning from  your back to your side while in a flat bed without using bedrails?: A Little Help needed moving from lying on your back to sitting on the side of a flat bed without using bedrails?: A Little Help needed moving to and from a bed to a chair (including a wheelchair)?: A Little Help needed standing up from a chair using your arms (e.g., wheelchair or bedside chair)?: A Little Help needed to walk in hospital room?: A Little Help needed climbing 3-5 steps with a railing? : A Little 6 Click Score: 18    End of Session Equipment Utilized During Treatment: Gait belt Activity Tolerance: Patient tolerated treatment well Patient left: in bed;with call bell/phone within reach;with family/visitor present   PT Visit Diagnosis: Pain;Other abnormalities of gait and mobility (R26.89) Pain -  Right/Left: Left Pain - part of body: Knee     Time: [id number] PT Time Calculation (min) (ACUTE ONLY): 27 min  Charges:  $Gait Training: 23-37 mins                        Weston Anna, PT Acute Rehabilitation Services Pager: [phone] Office: [phone]

## 2019-01-20 NOTE — Care Management Obs Status (Signed)
[place] NOTIFICATION   Patient Details  Name: Darlene Kelly MRN: [id number] Date of Birth: 1950-05-09   Medicare Observation Status Notification Given:  Yes    Lia Hopping, [place] 01/20/2019, 9:32 AM

## 2019-01-20 NOTE — Progress Notes (Signed)
Patient up with therapy dizzy and clammy b/p 84/50 abck to chair A Stinson notified with orders recieved

## 2019-01-20 NOTE — Plan of Care (Signed)
Plan of care reviewed and discussed with the patient. 

## 2019-01-20 NOTE — Care Management CC44 (Signed)
Condition Code 44 Documentation Completed  Patient Details  Name: Darlene Kelly MRN: [id number] Date of Birth: 08/13/1950   Condition Code 44 given:  Yes Patient signature on Condition Code 44 notice:  Yes Documentation of 2 MD's agreement:  Yes Code 44 added to claim:  Yes    Lia Hopping, LCSW 01/20/2019, 9:32 AM

## 2019-01-20 NOTE — Progress Notes (Signed)
Physical Therapy Treatment Patient Details Name: Darlene Kelly MRN: [id number] DOB: 09/23/1950 Today's Date: 01/20/2019    History of Present Illness Patient is 68 y.o. 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    PT Comments    Pt continues to c/o lightheadedness when ambulating. BP 84/51 after short walk this a.m.-RN made aware. Will continue to follow and progress activity as safely able.    Follow Up Recommendations  Follow surgeon's recommendation for DC plan and follow-up therapies     Equipment Recommendations  None recommended by PT    Recommendations for Other Services       Precautions / Restrictions Precautions Precautions: Fall Restrictions Weight Bearing Restrictions: No Other Position/Activity Restrictions: WBAT    Mobility  Bed Mobility Overal bed mobility: Needs Assistance Bed Mobility: Supine to Sit     Supine to sit: Supervision;HOB elevated        Transfers Overall transfer level: Needs assistance Equipment used: Rolling walker (2 wheeled) Transfers: Sit to/from Stand Sit to Stand: Min assist         General transfer comment: Assist to rise, stabilize, control descent. VCs safety, technique, hand placement  Ambulation/Gait Ambulation/Gait assistance: Min assist;+2 safety/equipment Gait Distance (Feet): 40 Feet Assistive device: Rolling walker (2 wheeled) Gait Pattern/deviations: Step-to pattern;Step-through pattern;Decreased stride length;Antalgic     General Gait Details: slow gait speed with 2 brief standing rest breaks. pt c/o feeling hot and lightheaded. BP 84/51 once seated back in recliner. Deferred further ambulation and transported pt back to room in recliner.   Stairs             Wheelchair Mobility    Modified Rankin (Stroke Patients Only)       Balance Overall balance assessment: Needs assistance         Standing balance support: Bilateral upper extremity  supported Standing balance-Leahy Scale: Poor                              Cognition Arousal/Alertness: Awake/alert Behavior During Therapy: WFL for tasks assessed/performed Overall Cognitive Status: Within Functional Limits for tasks assessed                                        Exercises Total Joint Exercises Ankle Circles/Pumps: AROM;Both;10 reps;Supine Quad Sets: AROM;Both;10 reps;Supine Heel Slides: AAROM;Left;10 reps;Supine Hip ABduction/ADduction: AROM;Left;10 reps;Supine Straight Leg Raises: AROM;AAROM;Left;10 reps;Supine Goniometric ROM: ~10-60 degrees    General Comments        Pertinent Vitals/Pain Pain Assessment: 0-10 Pain Score: 7  Pain Location: L Knee Pain Descriptors / Indicators: Aching;Discomfort;Sore Pain Intervention(s): Monitored during session;Ice applied;Repositioned    Home Living                      Prior Function            PT Goals (current goals can now be found in the care plan section) Progress towards PT goals: Progressing toward goals    Frequency    7X/week      PT Plan Current plan remains appropriate    Co-evaluation              AM-PAC PT "6 Clicks" Mobility   Outcome Measure  Help needed turning from your back to your side while in a flat bed  without using bedrails?: A Little Help needed moving from lying on your back to sitting on the side of a flat bed without using bedrails?: A Little Help needed moving to and from a bed to a chair (including a wheelchair)?: A Little Help needed standing up from a chair using your arms (e.g., wheelchair or bedside chair)?: A Little Help needed to walk in hospital room?: A Little Help needed climbing 3-5 steps with a railing? : A Little 6 Click Score: 18    End of Session Equipment Utilized During Treatment: Gait belt Activity Tolerance: Patient tolerated treatment well Patient left: in chair;with call bell/phone within reach;with  family/visitor present;with chair alarm set   PT Visit Diagnosis: Pain;Other abnormalities of gait and mobility (R26.89) Pain - Right/Left: Left Pain - part of body: Knee     Time: [id number] PT Time Calculation (min) (ACUTE ONLY): 26 min  Charges:  $Gait Training: 8-22 mins $Therapeutic Exercise: 8-22 mins                        Weston Anna, PT Acute Rehabilitation Services Pager: [phone] Office: [phone]

## 2019-01-20 NOTE — Progress Notes (Signed)
Orthopedic Tech Progress Note Patient Details:  Darlene Kelly 1950-05-13 [id number] Pt did not want to CPM due to pain. Ortho Devices Type of Ortho Device: CPM padding   Post Interventions Patient Tolerated: Well Instructions Provided: Care of device, Adjustment of device   Ladell Pier [facility] 01/20/2019, 3:36 PM

## 2019-01-20 NOTE — Progress Notes (Signed)
   Subjective: 1 Day Post-Op Procedure(s) (LRB): TOTAL KNEE ARTHROPLASTY (Left) Patient reports pain as mild.   Patient seen in rounds with Dr. Wynelle Link. Patient is well, and has had no acute complaints or problems. She does have some discomfort in the left knee. Walked with therapy yesterday. No issues overnight. No SOB or chest pain. Voiding well. Positive flatus.  Plan is to go Home after hospital stay.  Objective: Vital signs in last 24 hours: Temp:  [97.4 F (36.3 C)-98.9 F (37.2 C)] 98.5 F (36.9 C) (10/20 0532) Pulse Rate:  [56-84] 56 (10/20 0532) Resp:  [11-23] 17 (10/20 0532) BP: (78-162)/(41-93) 114/61 (10/20 0532) SpO2:  [96 %-100 %] 100 % (10/20 0532) Weight:  [74.4 kg] 74.4 kg (10/19 1321)  Intake/Output from previous day:  Intake/Output Summary (Last 24 hours) at 01/20/2019 0746 Last data filed at 01/20/2019 0600 Gross per 24 hour  Intake 3629.09 ml  Output 3050 ml  Net 579.09 ml     Labs: Recent Labs    01/20/19 0308  HGB 10.5*   Recent Labs    01/20/19 0308  WBC 9.6  RBC 3.40*  HCT 32.1*  PLT 161   Recent Labs    01/20/19 0308  NA 136  K 4.1  CL 105  CO2 23  BUN 12  CREATININE 0.64  GLUCOSE 171*  CALCIUM 8.6*    EXAM General - Patient is Alert and Oriented Extremity - Neurologically intact Intact pulses distally Dorsiflexion/Plantar flexion intact No cellulitis present Compartment soft Dressing - dressing C/D/I Motor Function - intact, moving foot and toes well on exam.  Hemovac pulled without difficulty.  Past Medical History:  Diagnosis Date  . Breast cancer ([place])   . Breast cancer of upper-outer quadrant of right female breast ([place]) 07/16/2014  . Diabetes mellitus without complication ([place])   . Heart murmur   . Heart murmur    from rhumatic fever as a child  . Hypertension   . Personal history of chemotherapy 2016  . Personal history of radiation therapy   . Skin cancer     Assessment/Plan: 1 Day Post-Op  Procedure(s) (LRB): TOTAL KNEE ARTHROPLASTY (Left) Principal Problem:   OA (osteoarthritis) of knee  Estimated body mass index is 30.99 kg/m as calculated from the following:   Height as of this encounter: 5\' 1"  (1.549 m).   Weight as of this encounter: 74.4 kg. Advance diet Up with therapy D/C IV fluids when tolerating POs well  DVT Prophylaxis - Xarelto Weight-Bearing as tolerated  D/C O2 and Pulse OX and try on Room Air  Anticipated LOS equal to or greater than 2 midnights due to - Age 27 and older with one or more of the following:  - Obesity  - Expected need for hospital services (PT, OT, Nursing) required for safe  discharge  - Anticipated need for postoperative skilled nursing care or inpatient rehab  - Active co-morbidities: Diabetes OR   - Unanticipated findings during/Post Surgery: None  - Patient is a high risk of re-admission due to: None   Continue therapy today. DC home this afternoon if she is progressing and meeting her goals. Follow up in 2 weeks. Discharge instructions given.   Ardeen Jourdain, PA-C Orthopaedic Surgery 01/20/2019, 7:46 AM

## 2019-01-21 ENCOUNTER — Observation Stay (HOSPITAL_COMMUNITY): Payer: Medicare Other

## 2019-01-21 DIAGNOSIS — M1712 Unilateral primary osteoarthritis, left knee: Secondary | ICD-10-CM | POA: Diagnosis not present

## 2019-01-21 LAB — CBC
HCT: 28.3 % — ABNORMAL LOW (ref 36.0–46.0)
Hemoglobin: 9.5 g/dL — ABNORMAL LOW (ref 12.0–15.0)
MCH: 31.3 pg (ref 26.0–34.0)
MCHC: 33.6 g/dL (ref 30.0–36.0)
MCV: 93.1 fL (ref 80.0–100.0)
Platelets: 132 10*3/uL — ABNORMAL LOW (ref 150–400)
RBC: 3.04 MIL/uL — ABNORMAL LOW (ref 3.87–5.11)
RDW: 13 % (ref 11.5–15.5)
WBC: 11.7 10*3/uL — ABNORMAL HIGH (ref 4.0–10.5)
nRBC: 0 % (ref 0.0–0.2)

## 2019-01-21 LAB — BASIC METABOLIC PANEL
Anion gap: 3 — ABNORMAL LOW (ref 5–15)
BUN: 10 mg/dL (ref 8–23)
CO2: 22 mmol/L (ref 22–32)
Calcium: 8.2 mg/dL — ABNORMAL LOW (ref 8.9–10.3)
Chloride: 99 mmol/L (ref 98–111)
Creatinine, Ser: 0.54 mg/dL (ref 0.44–1.00)
GFR calc Af Amer: >60 mL/min (ref >60)
GFR calc non Af Amer: >60 mL/min (ref >60)
Glucose, Bld: 145 mg/dL — ABNORMAL HIGH (ref 70–99)
Potassium: 3.6 mmol/L (ref 3.5–5.1)
Sodium: 124 mmol/L — ABNORMAL LOW (ref 135–145)

## 2019-01-21 NOTE — Plan of Care (Signed)
Care plan reviewed and discussed with the patient. 

## 2019-01-21 NOTE — Progress Notes (Signed)
   Subjective: 2 Days Post-Op Procedure(s) (LRB): TOTAL KNEE ARTHROPLASTY (Left) Patient reports pain as mild.   Patient seen in rounds for Dr. Wynelle Link. Patient is well, and has had no acute complaints or problems other than discomfort in the left knee.  Plan is to go Home after hospital stay.  Objective: Vital signs in last 24 hours: Temp:  [98.1 F (36.7 C)-99.5 F (37.5 C)] 99.1 F (37.3 C) (10/21 0546) Pulse Rate:  [59-78] 77 (10/21 0546) Resp:  [16-20] 20 (10/21 0546) BP: (84-119)/(44-72) 119/72 (10/21 0546) SpO2:  [85 %-97 %] 86 % (10/21 0546)  Intake/Output from previous day:  Intake/Output Summary (Last 24 hours) at 01/21/2019 0830 Last data filed at 01/21/2019 0546 Gross per 24 hour  Intake 2421.74 ml  Output 1550 ml  Net 871.74 ml    Intake/Output this shift: No intake/output data recorded.  Labs: Recent Labs    01/20/19 0308 01/21/19 0253  HGB 10.5* 9.5*   Recent Labs    01/20/19 0308 01/21/19 0253  WBC 9.6 11.7*  RBC 3.40* 3.04*  HCT 32.1* 28.3*  PLT 161 132*   Recent Labs    01/20/19 0308 01/21/19 0253  NA 136 124*  K 4.1 3.6  CL 105 99  CO2 23 22  BUN 12 10  CREATININE 0.64 0.54  GLUCOSE 171* 145*  CALCIUM 8.6* 8.2*   No results for input(s): LABPT, INR in the last 72 hours.  Exam: General - Patient is Alert and Oriented Extremity - Neurologically intact Sensation intact distally Intact pulses distally Dorsiflexion/Plantar flexion intact Dressing/Incision - clean, dry, no drainage Motor Function - intact, moving foot and toes well on exam.   Past Medical History:  Diagnosis Date  . Breast cancer ([place])   . Breast cancer of upper-outer quadrant of right female breast ([place]) 07/16/2014  . Diabetes mellitus without complication ([place])   . Heart murmur   . Heart murmur    from rhumatic fever as a child  . Hypertension   . Personal history of chemotherapy 2016  . Personal history of radiation therapy   . Skin cancer      Assessment/Plan: 2 Days Post-Op Procedure(s) (LRB): TOTAL KNEE ARTHROPLASTY (Left) Principal Problem:   OA (osteoarthritis) of knee  Estimated body mass index is 30.99 kg/m as calculated from the following:   Height as of this encounter: 5\' 1"  (1.549 m).   Weight as of this encounter: 74.4 kg. Advance diet Up with therapy  DVT Prophylaxis - Xarelto Weight-bearing as tolerated  Possible discharge home today. She will continue working with therapy, and we will monitor her blood pressure. If her hypotension is improved, and she is meeting goals with therapy she may discharge home today. We will obtain a chest XRAY today to ensure she did not have any fluid overload after increasing IVF yesterday. She is scheduled for OPPT. Follow up int he office in 2 weeks.  Griffith Citron, PA-C Orthopedic Surgery [phone] 01/21/2019, 8:30 AM

## 2019-01-21 NOTE — Plan of Care (Signed)
done

## 2019-01-21 NOTE — Progress Notes (Signed)
Physical Therapy Treatment Patient Details Name: Darlene Kelly MRN: [id number] DOB: [birth year] Today's Date: 01/21/2019    History of Present Illness Patient is 67 y.o. 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    PT Comments    Improved mobility and activity tolerance on today. Pt denied dizziness. SpO2 94% on RA during session. Reviewed/practiced exercises, gait training, and stair training. Issued HEP for pt to perform until she begins OPPT.  Okay to d/c from PT standpoint.    Follow Up Recommendations  Follow surgeon's recommendation for DC plan and follow-up therapies     Equipment Recommendations  None recommended by PT    Recommendations for Other Services       Precautions / Restrictions Precautions Precautions: Fall Restrictions Weight Bearing Restrictions: No Other Position/Activity Restrictions: WBAT    Mobility  Bed Mobility               General bed mobility comments: oob in recliner  Transfers Overall transfer level: Needs assistance Equipment used: Rolling walker (2 wheeled) Transfers: Sit to/from Stand Sit to Stand: Supervision         General transfer comment: VCs safety, technique, hand placement  Ambulation/Gait Ambulation/Gait assistance: Supervision Gait Distance (Feet): 85 Feet Assistive device: Rolling walker (2 wheeled) Gait Pattern/deviations: Step-through pattern;Step-to pattern;Decreased stride length     General Gait Details: for safety.   Stairs Stairs: Yes Stairs assistance: Min guard Stair Management: Step to pattern;Forwards;Two rails Number of Stairs: 3 General stair comments: Up and over portable steps. VCs safety, technique, sequence. Close guard for safety.   Wheelchair Mobility    Modified Rankin (Stroke Patients Only)       Balance Overall balance assessment: Mild deficits observed, not formally tested         Standing balance support: Bilateral upper  extremity supported Standing balance-Leahy Scale: Poor                              Cognition Arousal/Alertness: Awake/alert Behavior During Therapy: WFL for tasks assessed/performed Overall Cognitive Status: Within Functional Limits for tasks assessed                                        Exercises Total Joint Exercises Ankle Circles/Pumps: AROM;Both;10 reps;Supine Quad Sets: AROM;Both;10 reps;Supine Heel Slides: AAROM;Left;10 reps;Supine Hip ABduction/ADduction: AROM;Left;10 reps;Supine Straight Leg Raises: AROM;AAROM;Left;10 reps;Supine Goniometric ROM: ~10-65 degrees    General Comments        Pertinent Vitals/Pain Pain Assessment: 0-10 Pain Score: 5  Pain Location: L Knee Pain Descriptors / Indicators: Aching;Discomfort;Sore Pain Intervention(s): Monitored during session;Ice applied;Repositioned    Home Living                      Prior Function            PT Goals (current goals can now be found in the care plan section) Progress towards PT goals: Progressing toward goals    Frequency    7X/week      PT Plan Current plan remains appropriate    Co-evaluation              AM-PAC PT "6 Clicks" Mobility   Outcome Measure  Help needed turning from your back to your side while in a flat bed without using bedrails?: A  Little Help needed moving from lying on your back to sitting on the side of a flat bed without using bedrails?: A Little Help needed moving to and from a bed to a chair (including a wheelchair)?: A Little Help needed standing up from a chair using your arms (e.g., wheelchair or bedside chair)?: A Little Help needed to walk in hospital room?: A Little Help needed climbing 3-5 steps with a railing? : A Little 6 Click Score: 18    End of Session Equipment Utilized During Treatment: Gait belt Activity Tolerance: Patient tolerated treatment well Patient left: in chair;with call bell/phone within  reach   PT Visit Diagnosis: Pain;Other abnormalities of gait and mobility (R26.89) Pain - Right/Left: Left Pain - part of body: Knee     Time: 0921-0942 PT Time Calculation (min) (ACUTE ONLY): 21 min  Charges:  $Gait Training: 8-22 mins                        Weston Anna, [place] Pager: [phone] Office: [phone]

## 2019-01-22 NOTE — Discharge Summary (Signed)
Physician Discharge Summary   Patient ID: Darlene Kelly MRN: [id number] DOB/AGE: 04-27-1950 68 y.o.  Admit date: 01/19/2019 Discharge date: 01/21/2019  Primary Diagnosis: Osteoarthritis, left knee  Admission Diagnoses:  Past Medical History:  Diagnosis Date  . Breast cancer ([place])   . Breast cancer of upper-outer quadrant of right female breast ([place]) 07/16/2014  . Diabetes mellitus without complication ([place])   . Heart murmur   . Heart murmur    from rhumatic fever as a child  . Hypertension   . Personal history of chemotherapy 2016  . Personal history of radiation therapy   . Skin cancer    Discharge Diagnoses:   Principal Problem:   OA (osteoarthritis) of knee  Estimated body mass index is 30.99 kg/m as calculated from the following:   Height as of this encounter: 5\' 1"  (1.549 m).   Weight as of this encounter: 74.4 kg.  Procedure:  Procedure(s) (LRB): TOTAL KNEE ARTHROPLASTY (Left)   Consults: None  HPI:  Darlene Kelly is a 68 y.o. year old female with end stage OA of her left knee with progressively worsening pain and dysfunction. She has constant pain, with activity and at rest and significant functional deficits with difficulties even with ADLs. She has had extensive non-op management including analgesics, injections of cortisone and viscosupplements, and home exercise program, but remains in significant pain with significant dysfunction. Radiographs show bone on bone arthritis medial and patellofemoral. She presents now for left Total Knee Arthroplasty.    Laboratory Data: Admission on 01/19/2019, Discharged on 01/21/2019  Component Date Value Ref Range Status  . Glucose-Capillary 01/19/2019 120* 70 - 99 mg/dL Final  . WBC 01/20/2019 9.6  4.0 - 10.5 K/uL Final  . RBC 01/20/2019 3.40* 3.87 - 5.11 MIL/uL Final  . Hemoglobin 01/20/2019 10.5* 12.0 - 15.0 g/dL Final  . HCT 01/20/2019 32.1* 36.0 - 46.0 % Final  . MCV 01/20/2019 94.4  80.0 - 100.0 fL Final  . MCH  01/20/2019 30.9  26.0 - 34.0 pg Final  . MCHC 01/20/2019 32.7  30.0 - 36.0 g/dL Final  . RDW 01/20/2019 12.6  11.5 - 15.5 % Final  . Platelets 01/20/2019 161  150 - 400 K/uL Final  . nRBC 01/20/2019 0.0  0.0 - 0.2 % Final   Performed at [facility], [place] [address] [place], [place] [address]  . Sodium 01/20/2019 136  135 - 145 mmol/L Final  . Potassium 01/20/2019 4.1  3.5 - 5.1 mmol/L Final  . Chloride 01/20/2019 105  98 - 111 mmol/L Final  . CO2 01/20/2019 23  22 - 32 mmol/L Final  . Glucose, Bld 01/20/2019 171* 70 - 99 mg/dL Final  . BUN 01/20/2019 12  8 - 23 mg/dL Final  . Creatinine, Ser 01/20/2019 0.64  0.44 - 1.00 mg/dL Final  . Calcium 01/20/2019 8.6* 8.9 - 10.3 mg/dL Final  . GFR calc non Af Amer 01/20/2019 >60  >60 mL/min Final  . GFR calc Af Amer 01/20/2019 >60  >60 mL/min Final  . Anion gap 01/20/2019 8  5 - 15 Final   Performed at [facility], [place] [address], [place], [place] [address]  . WBC 01/21/2019 11.7* 4.0 - 10.5 K/uL Final  . RBC 01/21/2019 3.04* 3.87 - 5.11 MIL/uL Final  . Hemoglobin 01/21/2019 9.5* 12.0 - 15.0 g/dL Final  . HCT 01/21/2019 28.3* 36.0 - 46.0 % Final  . MCV 01/21/2019 93.1  80.0 - 100.0 fL Final  . [facility] 01/21/2019  31.3  26.0 - 34.0 pg Final  . MCHC 01/21/2019 33.6  30.0 - 36.0 g/dL Final  . RDW 01/21/2019 13.0  11.5 - 15.5 % Final  . Platelets 01/21/2019 132* 150 - 400 K/uL Final   Comment: REPEATED TO VERIFY CONSISTENT WITH PREVIOUS RESULT   . nRBC 01/21/2019 0.0  0.0 - 0.2 % Final   Performed at [place] [address], [place], [place] [address]  . Sodium 01/21/2019 124* 135 - 145 mmol/L Final   Comment: REPEATED TO VERIFY DELTA CHECK NOTED   . Potassium 01/21/2019 3.6  3.5 - 5.1 mmol/L Final  . Chloride 01/21/2019 99  98 - 111 mmol/L Final  . CO2 01/21/2019 22  22 - 32 mmol/L Final  . Glucose, Bld 01/21/2019 145* 70 - 99 mg/dL Final  . BUN 01/21/2019 10  8 - 23 mg/dL Final   . Creatinine, Ser 01/21/2019 0.54  0.44 - 1.00 mg/dL Final  . Calcium 01/21/2019 8.2* 8.9 - 10.3 mg/dL Final  . GFR calc non Af Amer 01/21/2019 >60  >60 mL/min Final  . GFR calc Af Amer 01/21/2019 >60  >60 mL/min Final  . Anion gap 01/21/2019 3* 5 - 15 Final   Performed at [facility], [place] [address], [place], [place] [address]  Hospital Outpatient Visit on 01/15/2019  Component Date Value Ref Range Status  . SARS-CoV-2, NAA 01/15/2019 NOT DETECTED  NOT DETECTED Final   Comment: (NOTE) This nucleic acid amplification test was developed and its performance characteristics determined by [employer]. Nucleic acid amplification tests include PCR and TMA. This test has not been FDA cleared or approved. This test has been authorized by FDA under an Emergency Use Authorization (EUA). This test is only authorized for the duration of time the declaration that circumstances exist justifying the authorization of the emergency use of in vitro diagnostic tests for detection of SARS-CoV-2 virus and/or diagnosis of COVID-19 infection under section 564(b)(1) of the Act, 21 U.S.C. [id number]) (1), unless the authorization is terminated or revoked sooner. When diagnostic testing is negative, the possibility of a false negative result should be considered in the context of a patient's recent exposures and the presence of clinical signs and symptoms consistent with COVID-19. An individual without symptoms of COVID- 19 and who is not shedding SARS-CoV-2 vi                          rus would expect to have a negative (not detected) result in this assay. Performed At: [facility] [place], [place] [id number] Rush Farmer MD [id number]   . Coronavirus Source 01/15/2019 NASOPHARYNGEAL   Final   Performed at [facility], [place] [address] [place], [place] [address]  Hospital Outpatient Visit on 01/09/2019  Component Date Value Ref Range Status   . aPTT 01/09/2019 25  24 - 36 seconds Final   Performed at [facility], [place] [address], [place], [place] [address]  . WBC 01/09/2019 5.5  4.0 - 10.5 K/uL Final  . RBC 01/09/2019 4.35  3.87 - 5.11 MIL/uL Final  . Hemoglobin 01/09/2019 13.4  12.0 - 15.0 g/dL Final  . HCT 01/09/2019 40.1  36.0 - 46.0 % Final  . MCV 01/09/2019 92.2  80.0 - 100.0 fL Final  . MCH 01/09/2019 30.8  26.0 - 34.0 pg Final  . MCHC 01/09/2019 33.4  30.0 - 36.0 g/dL Final  . RDW 01/09/2019 12.7  11.5 - 15.5 % Final  . Platelets 01/09/2019 185  150 - 400 K/uL Final  . nRBC 01/09/2019 0.0  0.0 - 0.2 % Final   Performed at [facility], [place] [address], [place], [place] [address]  . Sodium 01/09/2019 137  135 - 145 mmol/L Final  . Potassium 01/09/2019 4.6  3.5 - 5.1 mmol/L Final  . Chloride 01/09/2019 104  98 - 111 mmol/L Final  . CO2 01/09/2019 24  22 - 32 mmol/L Final  . Glucose, Bld 01/09/2019 140* 70 - 99 mg/dL Final  . BUN 01/09/2019 11  8 - 23 mg/dL Final  . Creatinine, Ser 01/09/2019 0.69  0.44 - 1.00 mg/dL Final  . Calcium 01/09/2019 9.6  8.9 - 10.3 mg/dL Final  . Total Protein 01/09/2019 6.9  6.5 - 8.1 g/dL Final  . Albumin 01/09/2019 4.0  3.5 - 5.0 g/dL Final  . AST 01/09/2019 28  15 - 41 U/L Final  . ALT 01/09/2019 24  0 - 44 U/L Final  . Alkaline Phosphatase 01/09/2019 42  38 - 126 U/L Final  . Total Bilirubin 01/09/2019 0.4  0.3 - 1.2 mg/dL Final  . GFR calc non Af Amer 01/09/2019 >60  >60 mL/min Final  . GFR calc Af Amer 01/09/2019 >60  >60 mL/min Final  . Anion gap 01/09/2019 9  5 - 15 Final   Performed at [facility], [place] [address] [place], [place] [address]  . Prothrombin Time 01/09/2019 12.8  11.4 - 15.2 seconds Final  . INR 01/09/2019 1.0  0.8 - 1.2 Final   Comment: (NOTE) INR goal varies based on device and disease states. Performed at [facility], [place] [address] [place], [place] [address]   . ABO/RH(D)  01/09/2019 A POS   Final  . Antibody Screen 01/09/2019 NEG   Final  . Sample Expiration 01/09/2019 01/22/2019,2359   Final  . Extend sample reason 01/09/2019    Final                   Value:NO TRANSFUSIONS OR PREGNANCY IN THE PAST 3 MONTHS Performed at [place] [address], [place], [place] [address]   . Glucose-Capillary 01/09/2019 126* 70 - 99 mg/dL Final  . MRSA, PCR 01/09/2019 NEGATIVE  NEGATIVE Final  . Staphylococcus aureus 01/09/2019 NEGATIVE  NEGATIVE Final   Comment: (NOTE) The Xpert SA Assay (FDA approved for NASAL specimens in patients 55 years of age and older), is one component of a comprehensive surveillance program. It is not intended to diagnose infection nor to guide or monitor treatment. Performed at [facility], [place] [address], [place], [place] [address]   . Hgb A1c MFr Bld 01/09/2019 6.1* 4.8 - 5.6 % Final   Comment: (NOTE) Pre diabetes:          5.7%-6.4% Diabetes:              >6.4% Glycemic control for   <7.0% adults with diabetes   . Mean Plasma Glucose 01/09/2019 128.37  mg/dL Final   Performed at [place] [address], [place], [place] [address]  . ABO/RH(D) 01/09/2019    Final                   Value:A POS Performed at [facility], [place] [address], [place], [place] [address]      X-Rays:No results found.  EKG: Orders placed or performed in visit on  01/06/19  . EKG 12-Lead     Hospital Course: Darlene Kelly is a 68 y.o. who was admitted to [facility]. They were brought to the operating room on 01/19/2019 and underwent Procedure(s): TOTAL KNEE ARTHROPLASTY.  Patient tolerated the procedure well and was later transferred to the recovery room and then to the orthopaedic floor for postoperative care. They were given PO and IV analgesics for pain control following their surgery. They were given 24 hours of postoperative antibiotics of  Anti-infectives (From  admission, onward)   Start     Dose/Rate Route Frequency Ordered Stop   01/19/19 1630  ceFAZolin (ANCEF) IVPB 2g/100 mL premix     2 g 200 mL/hr over 30 Minutes Intravenous Every 6 hours 01/19/19 1329 01/19/19 2313   01/19/19 0815  ceFAZolin (ANCEF) IVPB 2g/100 mL premix     2 g 200 mL/hr over 30 Minutes Intravenous On call to O.R. 01/19/19 [id number] 01/19/19 1038     and started on DVT prophylaxis in the form of Xarelto.   PT and OT were ordered for total joint protocol. Discharge planning consulted to help with postop disposition and equipment needs. Patient had a good night on the evening of surgery. They started to get up OOB with therapy on POD #1. She ambulated 60 feet with PT, but was limited by symptomatic hypotension. She was given a 250 NS bolus with improvement. Hemovac drain was pulled without difficulty on day one. Continued to work with therapy into POD #2. Pt was seen during rounds on day two and was ready to go home pending progress with therapy. Dressing was changed and the incision was clean and intact. Pt worked with therapy for [employer] additional session and was meeting their goals. She was discharged to home later that day in stable condition.  Diet: Regular diet Activity: WBAT Follow-up: in 2 weeks Disposition: Home Discharged Condition: good   Discharge Instructions    Call MD / Call 911   Complete by: As directed    If you experience chest pain or shortness of breath, CALL 911 and be transported to the hospital emergency room.  If you develope a fever above 101 F, pus (white drainage) or increased drainage or redness at the wound, or calf pain, call your surgeon's office.   Constipation Prevention   Complete by: As directed    Drink plenty of fluids.  Prune juice may be helpful.  You may use a stool softener, such as Colace (over the counter) 100 mg twice a day.  Use MiraLax (over the counter) for constipation as needed.   Diet - low sodium heart healthy   Complete by: As  directed    Discharge instructions   Complete by: As directed    Dr. Gaynelle Arabian Total Joint Specialist [facility] [address], [place], [place] [address] [phone]  TOTAL KNEE REPLACEMENT POSTOPERATIVE DIRECTIONS  Knee Rehabilitation, Guidelines Following Surgery  Results after knee surgery are often greatly improved when you follow the exercise, range of motion and muscle strengthening exercises prescribed by your doctor. Safety measures are also important to protect the knee from further injury. Any time any of these exercises cause you to have increased pain or swelling in your knee joint, decrease the amount until you are comfortable again and slowly increase them. If you have problems or questions, call your caregiver or physical therapist for advice.   HOME CARE INSTRUCTIONS  Remove items at home which could result in a fall.  This includes throw rugs or furniture in walking pathways.  ICE to the affected knee every three hours for 30 minutes at a time and then as needed for pain and swelling.  Continue to use ice on the knee for pain and swelling from surgery. You may notice swelling that will progress down to the foot and ankle.  This is normal after surgery.  Elevate the leg when you are not up walking on it.   Continue to use the breathing machine which will help keep your temperature down.  It is common for your temperature to cycle up and down following surgery, especially at night when you are not up moving around and exerting yourself.  The breathing machine keeps your lungs expanded and your temperature down. Do not place pillow under knee, focus on keeping the knee straight while resting  DIET You may resume your previous home diet once your are discharged from the hospital.  DRESSING / WOUND CARE / SHOWERING You may shower 3 days after surgery, but keep the wounds dry during showering.  You may use an occlusive plastic wrap (Press'n Seal for example),  NO SOAKING/SUBMERGING IN THE BATHTUB.  If the bandage gets wet, change with a clean dry gauze.  If the incision gets wet, pat the wound dry with a clean towel. You may start showering once you are discharged home but do not submerge the incision under water. Just pat the incision dry and apply a dry gauze dressing on daily. Change the surgical dressing daily and reapply a dry dressing each time.  ACTIVITY Walk with your walker as instructed. Use walker as long as suggested by your caregivers. Avoid periods of inactivity such as sitting longer than an hour when not asleep. This helps prevent blood clots.  You may resume a sexual relationship in one month or when given the OK by your doctor.  You may return to work once you are cleared by your doctor.  Do not drive a car for 6 weeks or until released by you surgeon.  Do not drive while taking narcotics.  WEIGHT BEARING Weight bearing as tolerated with assist device (walker, cane, etc) as directed, use it as long as suggested by your surgeon or therapist, typically at least 4-6 weeks.  POSTOPERATIVE CONSTIPATION PROTOCOL Constipation - defined medically as fewer than three stools per week and severe constipation as less than one stool per week.  One of the most common issues patients have following surgery is constipation.  Even if you have a regular bowel pattern at home, your normal regimen is likely to be disrupted due to multiple reasons following surgery.  Combination of anesthesia, postoperative narcotics, change in appetite and fluid intake all can affect your bowels.  In order to avoid complications following surgery, here are some recommendations in order to help you during your recovery period.  Colace (docusate) - Pick up an over-the-counter form of Colace or another stool softener and take twice a day as long as you are requiring postoperative pain medications.  Take with a full glass of water daily.  If you experience loose stools or  diarrhea, hold the colace until you stool forms back up.  If your symptoms do not get better within 1 week or if they get worse, check with your doctor.  Dulcolax (bisacodyl) - Pick up over-the-counter and take as directed by the product packaging as needed to assist with the movement of your bowels.  Take with a full glass of water.  Use  this product as needed if not relieved by Colace only.   MiraLax (polyethylene glycol) - Pick up over-the-counter to have on hand.  MiraLax is a solution that will increase the amount of water in your bowels to assist with bowel movements.  Take as directed and can mix with a glass of water, juice, soda, coffee, or tea.  Take if you go more than two days without a movement. Do not use MiraLax more than once per day. Call your doctor if you are still constipated or irregular after using this medication for 7 days in a row.  If you continue to have problems with postoperative constipation, please contact the office for further assistance and recommendations.  If you experience "the worst abdominal pain ever" or develop nausea or vomiting, please contact the office immediatly for further recommendations for treatment.  ITCHING  If you experience itching with your medications, try taking only a single pain pill, or even half a pain pill at a time.  You can also use Benadryl over the counter for itching or also to help with sleep.   TED HOSE STOCKINGS Wear the elastic stockings on both legs for three weeks following surgery during the day but you may remove then at night for sleeping.  MEDICATIONS See your medication summary on the "After Visit Summary" that the nursing staff will review with you prior to discharge.  You may have some home medications which will be placed on hold until you complete the course of blood thinner medication.  It is important for you to complete the blood thinner medication as prescribed by your surgeon.  Continue your approved medications as  instructed at time of discharge.  Gabapentin 300 mg Protocol Take a 300 mg capsule three times a day for two weeks, Then a 300 mg capsule twice a day for two weeks, Then a 300 mg capsule once a day for two weeks, then discontinue the Gabapentin.  Information on my medicine - XARELTO (Rivaroxaban)  Why was Xarelto prescribed for you? Xarelto was prescribed for you to reduce the risk of blood clots forming after orthopedic surgery. The medical term for these abnormal blood clots is venous thromboembolism (VTE).  What do you need to know about xarelto ? Take your Xarelto ONCE DAILY at the same time every day. You may take it either with or without food.  If you have difficulty swallowing the tablet whole, you may crush it and mix in applesauce just prior to taking your dose.  Take Xarelto exactly as prescribed by your doctor and DO NOT stop taking Xarelto without talking to the doctor who prescribed the medication.  Stopping without other VTE prevention medication to take the place of Xarelto may increase your risk of developing a clot.  After discharge, you should have regular check-up appointments with your healthcare provider that is prescribing your Xarelto.    What do you do if you miss a dose? If you miss a dose, take it as soon as you remember on the same day then continue your regularly scheduled once daily regimen the next day. Do not take two doses of Xarelto on the same day.   Important Safety Information A possible side effect of Xarelto is bleeding. You should call your healthcare provider right away if you experience any of the following: Bleeding from an injury or your nose that does not stop. Unusual colored urine (red or dark brown) or unusual colored stools (red or black). Unusual bruising for unknown reasons. A  serious fall or if you hit your head (even if there is no bleeding).  Some medicines may interact with Xarelto and might increase your risk of  bleeding while on Xarelto. To help avoid this, consult your healthcare provider or pharmacist prior to using any new prescription or non-prescription medications, including herbals, vitamins, non-steroidal anti-inflammatory drugs (NSAIDs) and supplements.  This website has more information on Xarelto: [email or web address]   PRECAUTIONS If you experience chest pain or shortness of breath - call 911 immediately for transfer to the hospital emergency department.  If you develop a fever greater that 101 F, purulent drainage from wound, increased redness or drainage from wound, foul odor from the wound/dressing, or calf pain - CONTACT YOUR SURGEON.                                                   FOLLOW-UP APPOINTMENTS Make sure you keep all of your appointments after your operation with your surgeon and caregivers. You should call the office at the above phone number and make an appointment for approximately two weeks after the date of your surgery or on the date instructed by your surgeon outlined in the "After Visit Summary".   RANGE OF MOTION AND STRENGTHENING EXERCISES  Rehabilitation of the knee is important following a knee injury or an operation. After just a few days of immobilization, the muscles of the thigh which control the knee become weakened and shrink (atrophy). Knee exercises are designed to build up the tone and strength of the thigh muscles and to improve knee motion. Often times heat used for twenty to thirty minutes before working out will loosen up your tissues and help with improving the range of motion but do not use heat for the first two weeks following surgery. These exercises can be done on a training (exercise) mat, on the floor, on a table or on a bed. Use what ever works the best and is most comfortable for you Knee exercises include:  Leg Lifts - While your knee is still immobilized in a splint or cast, you can do straight leg raises. Lift the leg to 60 degrees, hold for 3  sec, and slowly lower the leg. Repeat 10-20 times 2-3 times daily. Perform this exercise against resistance later as your knee gets better.  Quad and Hamstring Sets - Tighten up the muscle on the front of the thigh (Quad) and hold for 5-10 sec. Repeat this 10-20 times hourly. Hamstring sets are done by pushing the foot backward against an object and holding for 5-10 sec. Repeat as with quad sets.  Leg Slides: Lying on your back, slowly slide your foot toward your buttocks, bending your knee up off the floor (only go as far as is comfortable). Then slowly slide your foot back down until your leg is flat on the floor again. Angel Wings: Lying on your back spread your legs to the side as far apart as you can without causing discomfort.  A rehabilitation program following serious knee injuries can speed recovery and prevent re-injury in the future due to weakened muscles. Contact your doctor or a physical therapist for more information on knee rehabilitation.   IF YOU ARE TRANSFERRED TO A SKILLED REHAB FACILITY If the patient is transferred to a skilled rehab facility following release from the hospital, a list of the current medications will  be sent to the facility for the patient to continue.  When discharged from the skilled rehab facility, please have the facility set up the patient's [place] prior to being released. Also, the skilled facility will be responsible for providing the patient with their medications at time of release from the facility to include their pain medication, the muscle relaxants, and their blood thinner medication. If the patient is still at the rehab facility at time of the two week follow up appointment, the skilled rehab facility will also need to assist the patient in arranging follow up appointment in our office and any transportation needs.  MAKE SURE YOU:  Understand these instructions.  Get help right away if you are not doing well or get worse.     Pick up stool softner and laxative for home use following surgery while on pain medications. Do not submerge incision under water. Please use good hand washing techniques while changing dressing each day. May shower starting three days after surgery. Please use a clean towel to pat the incision dry following showers. Continue to use ice for pain and swelling after surgery. Do not use any lotions or creams on the incision until instructed by your surgeon.   Increase activity slowly as tolerated   Complete by: As directed      Allergies as of 01/21/2019      Reactions   Latex Itching, Dermatitis, Rash   Lipitor [atorvastatin] Other (See Comments)   Bad leg cramps      Medication List    STOP taking these medications   aspirin EC 81 MG tablet   ibuprofen 200 MG tablet Commonly known as: ADVIL   MELATONIN PO   multivitamin with minerals Tabs tablet   Vitamin D3 250 MCG (10000 UT) Tabs   zinc gluconate 50 MG tablet     TAKE these medications   gabapentin 300 MG capsule Commonly known as: NEURONTIN Take 1 capsule (300 mg total) by mouth 3 (three) times daily. Take a 300 mg capsule three times a day for two weeks Then a 300 mg capsule twice a day for two weeks Then a 300 mg capsule once a day for two weeks   lisinopril 10 MG tablet Commonly known as: ZESTRIL Take 10 mg by mouth at bedtime.   methocarbamol 500 MG tablet Commonly known as: ROBAXIN Take 1 tablet (500 mg total) by mouth every 6 (six) hours as needed for muscle spasms.   metoprolol succinate 100 MG 24 hr tablet Commonly known as: TOPROL-XL Take 100 mg by mouth every evening.   oxyCODONE 5 MG immediate release tablet Commonly known as: Oxy IR/ROXICODONE Take 1-2 tablets (5-10 mg total) by mouth every 6 (six) hours as needed for severe pain.   rivaroxaban 10 MG Tabs tablet Commonly known as: XARELTO Take 1 tablet (10 mg total) by mouth daily with breakfast.   rosuvastatin 40 MG tablet Commonly known  as: CRESTOR Take 20 mg by mouth daily.   tamoxifen 20 MG tablet Commonly known as: NOLVADEX Take 20 mg by mouth daily.   traMADol 50 MG tablet Commonly known as: ULTRAM Take 1-2 tablets (50-100 mg total) by mouth every 6 (six) hours as needed for moderate pain.      Follow-up Information    Gaynelle Arabian, MD. Schedule an appointment as soon as possible for a visit on 02/03/2019.   Specialty: Orthopedic Surgery Contact information: [address] [place] [address] [phone]  Signed: Griffith Citron, PA-C Orthopedic Surgery 01/22/2019, 8:21 AM

## 2019-01-30 ENCOUNTER — Other Ambulatory Visit: Payer: Self-pay

## 2019-01-30 DIAGNOSIS — Z170 Estrogen receptor positive status [ER+]: Secondary | ICD-10-CM

## 2019-01-30 DIAGNOSIS — C50411 Malignant neoplasm of upper-outer quadrant of right female breast: Secondary | ICD-10-CM

## 2019-02-02 ENCOUNTER — Inpatient Hospital Stay: Payer: Medicare Other | Attending: Oncology

## 2019-02-02 ENCOUNTER — Other Ambulatory Visit: Payer: Self-pay

## 2019-02-02 DIAGNOSIS — Z170 Estrogen receptor positive status [ER+]: Secondary | ICD-10-CM | POA: Diagnosis not present

## 2019-02-02 DIAGNOSIS — Z9221 Personal history of antineoplastic chemotherapy: Secondary | ICD-10-CM | POA: Insufficient documentation

## 2019-02-02 DIAGNOSIS — C50411 Malignant neoplasm of upper-outer quadrant of right female breast: Secondary | ICD-10-CM

## 2019-02-02 DIAGNOSIS — C50511 Malignant neoplasm of lower-outer quadrant of right female breast: Secondary | ICD-10-CM | POA: Insufficient documentation

## 2019-02-02 DIAGNOSIS — Z923 Personal history of irradiation: Secondary | ICD-10-CM | POA: Insufficient documentation

## 2019-02-02 DIAGNOSIS — Z79810 Long term (current) use of selective estrogen receptor modulators (SERMs): Secondary | ICD-10-CM | POA: Diagnosis not present

## 2019-02-02 LAB — CBC WITH DIFFERENTIAL (CANCER CENTER ONLY)
Abs Immature Granulocytes: 0.02 10*3/uL (ref 0.00–0.07)
Basophils Absolute: 0 10*3/uL (ref 0.0–0.1)
Basophils Relative: 0 %
Eosinophils Absolute: 0 10*3/uL (ref 0.0–0.5)
Eosinophils Relative: 1 %
HCT: 32.4 % — ABNORMAL LOW (ref 36.0–46.0)
Hemoglobin: 10.7 g/dL — ABNORMAL LOW (ref 12.0–15.0)
Immature Granulocytes: 0 %
Lymphocytes Relative: 12 %
Lymphs Abs: 0.7 10*3/uL (ref 0.7–4.0)
MCH: 30.6 pg (ref 26.0–34.0)
MCHC: 33 g/dL (ref 30.0–36.0)
MCV: 92.6 fL (ref 80.0–100.0)
Monocytes Absolute: 0.4 10*3/uL (ref 0.1–1.0)
Monocytes Relative: 7 %
Neutro Abs: 5 10*3/uL (ref 1.7–7.7)
Neutrophils Relative %: 80 %
Platelet Count: 274 10*3/uL (ref 150–400)
RBC: 3.5 MIL/uL — ABNORMAL LOW (ref 3.87–5.11)
RDW: 13.4 % (ref 11.5–15.5)
WBC Count: 6.2 10*3/uL (ref 4.0–10.5)
nRBC: 0 % (ref 0.0–0.2)

## 2019-02-02 LAB — CMP (CANCER CENTER ONLY)
ALT: 38 U/L (ref 0–44)
AST: 26 U/L (ref 15–41)
Albumin: 3.2 g/dL — ABNORMAL LOW (ref 3.5–5.0)
Alkaline Phosphatase: 47 U/L (ref 38–126)
Anion gap: 10 (ref 5–15)
BUN: 13 mg/dL (ref 8–23)
CO2: 24 mmol/L (ref 22–32)
Calcium: 9.6 mg/dL (ref 8.9–10.3)
Chloride: 105 mmol/L (ref 98–111)
Creatinine: 0.82 mg/dL (ref 0.44–1.00)
GFR, Est AFR Am: >60 mL/min (ref >60)
GFR, Estimated: >60 mL/min (ref >60)
Glucose, Bld: 142 mg/dL — ABNORMAL HIGH (ref 70–99)
Potassium: 4.8 mmol/L (ref 3.5–5.1)
Sodium: 139 mmol/L (ref 135–145)
Total Bilirubin: 0.5 mg/dL (ref 0.3–1.2)
Total Protein: 6.6 g/dL (ref 6.5–8.1)

## 2019-02-07 NOTE — Progress Notes (Signed)
[facility]  [phone] [phone]     ID: Darlene Kelly DOB: 08/14/1950  MR#: [id number]  [name]#:[id number]  Patient Care Team: Darlene Kelly as PCP - General (Physician Assistant) Darlene Seltzer, MD as Consulting Physician (General Surgery) [name], Darlene Dad, MD as Consulting Physician (Oncology) Darlene Silversmith, MD as Consulting Physician (Radiation Oncology) Darlene Kaufmann, RN as Registered Nurse Darlene Germany, RN as Registered Nurse Darlene Kelly, Darlene Cheese, MD as Referring Physician (Cardiology) [name], Darlene Bravo, MD as Referring Physician (Dermatology) Darlene Bouche, NP (Inactive) as Nurse Practitioner (Nurse Practitioner) Darlene Cheese, NP as Nurse Practitioner (Hematology and Oncology) OTHER MD:  CHIEF COMPLAINT: Estrogen receptor positive breast cancer  CURRENT TREATMENT: Tamoxifen   INTERVAL HISTORY: Darlene Kelly returns today for follow-up of her estrogen receptor positive breast cancer.   She continues on Tamoxifen, which she tolerates well.  She does not have problems with hot flashes or significant vaginal discharge.  Since her last visit, she underwent bilateral diagnostic mammography with tomography at [facility] on 07/21/2018 showing: breast density category B; no evidence of malignancy in either breast.  She also underwent left total knee arthroplasty on 01/19/2019 under Dr. Wynelle Kelly.  She is very pleased with the results, is currently undergoing physical therapy.  She still has a couple of days of Xarelto left good she has had no bleeding problems.   REVIEW OF SYSTEMS: Darlene Kelly did lose a little bit of blood from her recent surgery but her hemoglobin is recovering.  She wants to walk up to 4 or 5 miles daily which is her prior routine.  She and her family are taking appropriate pandemic precautions.  A detailed review of systems today was otherwise stable.   BREAST CANCER HISTORY: From  the original intake note:  Darlene Kelly first noted a change in her right breast September 2015. At that time however she was consumed by the care of her mother-in-law, who had severe Alzheimer's disease. She finally passed in January 2016, but Darlene Kelly did not share the information regarding the right breast with her husband until late March 2016. At that point she was set up for bilateral diagnostic mammography with tomography at the breast Center, for 11/20/2014. This found the breast density to be category B. In the upper outer quadrant of the right breast there was a 4 cm irregular spiculated mass. There were no other findings of concern. The mass was palpable and firm, and by ultrasound measured 3.3 cm. The right axilla was negative sonographically.  Biopsy of the right breast mass in question was obtained for 03/22/2015. 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  On 07/20/2014 the patient underwent bilateral breast MRI. This measured the large irregular enhancing mass in the right breast at the 10:00 position at 5.0 cm. There were no additional worrisome findings in the right breast, and no findings in the left breast or any abnormal appearing lymph nodes.  The patient's subsequent history is as detailed below   PAST MEDICAL HISTORY: Past Medical History:  Diagnosis Date  . Breast cancer ([facility])   . Breast cancer of upper-outer quadrant of right female breast ([facility]) 07/16/2014  . Diabetes mellitus without complication ([facility])   . Heart murmur   . Heart murmur    from rhumatic fever as a child  . Hypertension   . Personal history of chemotherapy 2016  .  Personal history of radiation therapy   . Skin cancer     PAST SURGICAL HISTORY: Past Surgical History:  Procedure Laterality Date  . BREAST BIOPSY Right 07/13/2014   malignant  . BREAST  LUMPECTOMY Right 02/11/2015   malignant  . PORT-A-CATH REMOVAL Left 02/11/2015   Procedure: REMOVAL PORT-A-CATH;  Surgeon: Darlene Seltzer, MD;  Location: [facility];  Service: General;  Laterality: Left;  . PORTACATH PLACEMENT N/A 08/03/2014   Procedure: INSERTION PORT-A-CATH;  Surgeon: Darlene Seltzer, MD;  Location: [facility];  Service: General;  Laterality: N/A;  . RADIOACTIVE SEED GUIDED PARTIAL MASTECTOMY WITH AXILLARY SENTINEL LYMPH NODE BIOPSY Right 02/11/2015   Procedure: RIGHT RADIOACTIVE SEED LOCALIZATION LUMPECTOMY  WITH  RIGHT AXILLARY SENTINEL LYMPH NODE BIOPSY;  Surgeon: Darlene Seltzer, MD;  Location: [facility];  Service: General;  Laterality: Right;  . TOTAL KNEE ARTHROPLASTY Left 01/19/2019   Procedure: TOTAL KNEE ARTHROPLASTY;  Surgeon: Darlene Arabian, MD;  Location: [facility];  Service: Orthopedics;  Laterality: Left;  73mn    FAMILY HISTORY Family History  Problem Relation Age of Onset  . Heart disease Mother   . Lung cancer Father   . Heart disease Father   . Heart disease Brother    the patient's father died from lung cancer in the setting of tobacco abuse at the age of 636 The patient's mother died from COPD at the age of 861 The patient has 3 brothers, no sisters. There is no history of breast or ovarian cancer in the family.   GYNECOLOGIC HISTORY:  No LMP recorded. Patient is postmenopausal. Menarche age 68 first live birth age 68 She is GX P3. She went through the change of life in her late 411s She did not take hormone replacement.   SOCIAL HISTORY:  BJakyiahworked for [employer] but is now retired.  She has been one of our volunteers.  Her husband BDarnell Levelis rProgrammer, systemsfor a m[employer] They have been married more than 40 years. Son Darlene Kelly "Josh" BKiyanna Biegleris a cEnglish as a second language teacherin [place] Son MRodman Kelly"Darlene Kelly" BCoy Vandorenis a mDealerin [place] Daughter KRavin Bendallis a bPharmacologistfor B[employer]in [place] The  patient has 3 grandchildren.    ADVANCED DIRECTIVES: In place   HEALTH MAINTENANCE: Social History   Tobacco Use  . Smoking status: Former Smoker    Packs/day: 0.50    Years: 12.00    Pack years: 6.00    Types: Cigarettes    Quit date: 04/02/1986    Years since quitting: 32.8  . Smokeless tobacco: Never Used  Substance Use Topics  . Alcohol use: Yes    Alcohol/week: 3.0 standard drinks    Types: 3 Glasses of wine per week    Comment: occ  . Drug use: No     Colonoscopy: Never  PAP:  Bone density: 04/05/2015  Lipid panel:  Allergies  Allergen Reactions  . Latex Itching, Dermatitis and Rash  . Lipitor [Atorvastatin] Other (See Comments)    Bad leg cramps    Current Outpatient Medications  Medication Sig Dispense Refill  . gabapentin (NEURONTIN) 300 MG capsule Take 1 capsule (300 mg total) by mouth 3 (three) times daily. Take a 300 mg capsule three times a day for two weeks Then a 300 mg capsule twice a day for two weeks Then a 300 mg capsule once a day for two weeks 84 capsule 0  . lisinopril (PRINIVIL,ZESTRIL) 10 MG tablet Take 10 mg by mouth  at bedtime.   6  . methocarbamol (ROBAXIN) 500 MG tablet Take 1 tablet (500 mg total) by mouth every 6 (six) hours as needed for muscle spasms. 40 tablet 0  . metoprolol succinate (TOPROL-XL) 100 MG 24 hr tablet Take 100 mg by mouth every evening.     Marland Kitchen oxyCODONE (OXY IR/ROXICODONE) 5 MG immediate release tablet Take 1-2 tablets (5-10 mg total) by mouth every 6 (six) hours as needed for severe pain. 40 tablet 0  . rivaroxaban (XARELTO) 10 MG TABS tablet Take 1 tablet (10 mg total) by mouth daily with breakfast. 20 tablet 0  . rosuvastatin (CRESTOR) 40 MG tablet Take 20 mg by mouth daily.     . tamoxifen (NOLVADEX) 20 MG tablet Take 20 mg by mouth daily.     . traMADol (ULTRAM) 50 MG tablet Take 1-2 tablets (50-100 mg total) by mouth every 6 (six) hours as needed for moderate pain. 40 tablet 0   No current facility-administered  medications for this visit.     OBJECTIVE: Middle-aged white woman who appears stated age 68:   02/09/19 0857  BP: (!) 133/54  Pulse: 74  Resp: 17  Temp: 98.9 F (37.2 C)  SpO2: 99%     Body mass index is 31.29 kg/m.    ECOG FS:1 - Symptomatic but completely ambulatory  Sclerae unicteric, EOMs intact Wearing a mask No cervical or supraclavicular adenopathy Lungs no rales or rhonchi Heart regular rate and rhythm Abd soft, nontender, positive bowel sounds MSK no focal spinal tenderness, no upper extremity lymphedema Neuro: nonfocal, well oriented, appropriate affect Breasts: The right breast is status post complex followed by radiation with no evidence of disease recurrence.  Breast is benign.  Both axillae are benign.   LAB RESULTS:   CMP     Component Value Date/Time   NA 139 02/02/2019 0754   NA 139 01/21/2017 1142   K 4.8 02/02/2019 0754   K 4.9 01/21/2017 1142   CL 105 02/02/2019 0754   CO2 24 02/02/2019 0754   CO2 26 01/21/2017 1142   GLUCOSE 142 (H) 02/02/2019 0754   GLUCOSE 113 01/21/2017 1142   BUN 13 02/02/2019 0754   BUN 18.7 01/21/2017 1142   CREATININE 0.82 02/02/2019 0754   CREATININE 0.8 01/21/2017 1142   CALCIUM 9.6 02/02/2019 0754   CALCIUM 10.3 01/21/2017 1142   PROT 6.6 02/02/2019 0754   PROT 7.4 01/21/2017 1142   ALBUMIN 3.2 (L) 02/02/2019 0754   ALBUMIN 4.1 01/21/2017 1142   AST 26 02/02/2019 0754   AST 24 01/21/2017 1142   ALT 38 02/02/2019 0754   ALT 21 01/21/2017 1142   ALKPHOS 47 02/02/2019 0754   ALKPHOS 73 01/21/2017 1142   BILITOT 0.5 02/02/2019 0754   BILITOT 0.68 01/21/2017 1142   GFRNONAA >60 02/02/2019 0754   GFRAA >60 02/02/2019 0754    INo results found for: SPEP, UPEP  Lab Results  Component Value Date   WBC 6.2 02/02/2019   NEUTROABS 5.0 02/02/2019   HGB 10.7 (L) 02/02/2019   HCT 32.4 (L) 02/02/2019   MCV 92.6 02/02/2019   PLT 274 02/02/2019      Chemistry      Component Value Date/Time   NA 139  02/02/2019 0754   NA 139 01/21/2017 1142   K 4.8 02/02/2019 0754   K 4.9 01/21/2017 1142   CL 105 02/02/2019 0754   CO2 24 02/02/2019 0754   CO2 26 01/21/2017 1142   BUN 13 02/02/2019 0754  BUN 18.7 01/21/2017 1142   CREATININE 0.82 02/02/2019 0754   CREATININE 0.8 01/21/2017 1142      Component Value Date/Time   CALCIUM 9.6 02/02/2019 0754   CALCIUM 10.3 01/21/2017 1142   ALKPHOS 47 02/02/2019 0754   ALKPHOS 73 01/21/2017 1142   AST 26 02/02/2019 0754   AST 24 01/21/2017 1142   ALT 38 02/02/2019 0754   ALT 21 01/21/2017 1142   BILITOT 0.5 02/02/2019 0754   BILITOT 0.68 01/21/2017 1142       No results found for: LABCA2  No components found for: LABCA125  No results for input(s): INR in the last 168 hours.  Urinalysis No results found for: COLORURINE, APPEARANCEUR, LABSPEC, PHURINE, GLUCOSEU, HGBUR, BILIRUBINUR, KETONESUR, PROTEINUR, UROBILINOGEN, NITRITE, LEUKOCYTESUR   STUDIES: No results found.   ASSESSMENT: 68 y.o. [place] woman status post right breast upper outer quadrant biopsy 07/13/2014, for a clinical T3 N0, stage IIB invasive ductal carcinoma, grade 3, estrogen receptor positive, HER-2 and progesterone receptor negative, with an MIB-1 of 40%  (1) neoadjuvant chemotherapy starting 08/15/2013, consisting of doxorubicin and cyclophosphamide in dose dense fashion 4, completed 09/28/2014, followed by weekly paclitaxel 12 starting 10/19/2014, completed 01/04/2015  (2) status post right lumpectomy and sentinel lymph node sampling 02/11/2015 for a ypTis ypN0 ductal carcinoma in situ, with negative margins. This counts as a complete pathologic response  (3) adjuvant radiation completed 05/03/2015  (4) tamoxifen started 05/25/2015  (a) bone density 04/05/2015 was normal with a T score of -0.9  PLAN: Haven 4 years out from definitive surgery for her breast cancer with no evidence of disease recurrence.  This is very favorable.  Tolerating tamoxifen well  and the plan is to continue that for a total of 5 years.  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  Kaily Wragg, Darlene Dad, MD  02/09/19 9:02 AM Medical Oncology and Hematology [facility] [place], [place] [address] Tel. [phone]    Fax. [phone]   I, Wilburn Mylar, am acting as scribe for Dr. Virgie Kelly. Teola Felipe.  I, Lurline Del MD, have reviewed the above documentation for accuracy and completeness, and I agree with the above.

## 2019-02-09 ENCOUNTER — Other Ambulatory Visit: Payer: Self-pay

## 2019-02-09 ENCOUNTER — Inpatient Hospital Stay (HOSPITAL_BASED_OUTPATIENT_CLINIC_OR_DEPARTMENT_OTHER): Payer: Medicare Other | Admitting: Oncology

## 2019-02-09 VITALS — BP 133/54 | HR 74 | Temp 98.9°F | Resp 17 | Ht 61.0 in | Wt 165.6 lb

## 2019-02-09 DIAGNOSIS — C50411 Malignant neoplasm of upper-outer quadrant of right female breast: Secondary | ICD-10-CM

## 2019-02-09 DIAGNOSIS — Z170 Estrogen receptor positive status [ER+]: Secondary | ICD-10-CM

## 2019-02-09 DIAGNOSIS — C50511 Malignant neoplasm of lower-outer quadrant of right female breast: Secondary | ICD-10-CM | POA: Diagnosis not present

## 2019-02-09 MED ORDER — TAMOXIFEN CITRATE 20 MG PO TABS: 20.0000 mg | ORAL_TABLET | Freq: Every day | ORAL | 4 refills | Status: DC

## 2019-02-10 ENCOUNTER — Telehealth: Payer: Self-pay | Admitting: Oncology

## 2019-02-10 NOTE — Telephone Encounter (Signed)
I talk with patient regarding schedule  

## 2019-06-07 ENCOUNTER — Ambulatory Visit: Payer: Medicare Other | Attending: Internal Medicine

## 2019-06-07 DIAGNOSIS — Z23 Encounter for immunization: Secondary | ICD-10-CM | POA: Insufficient documentation

## 2019-06-07 NOTE — Progress Notes (Signed)
   Covid-19 Vaccination Clinic  Name:  ZAILYN MINTZ    MRN: [id number] DOB: 1950-11-08  06/07/2019  Ms. Leiman was observed post Covid-19 immunization for 15 minutes without incident. She was provided with Vaccine Information Sheet and instruction to access the V-Safe system.   Ms. Graebner was instructed to call 911 with any severe reactions post vaccine: Marland Kitchen Difficulty breathing  . Swelling of face and throat  . A fast heartbeat  . A bad rash all over body  . Dizziness and weakness   Immunizations Administered    Name Date Dose VIS Date Route   Pfizer COVID-19 Vaccine 06/07/2019  1:16 PM 0.3 mL 03/13/2019 Intramuscular   Manufacturer: [place]   Lot: [id number]   [place]: [id number]

## 2019-06-11 ENCOUNTER — Other Ambulatory Visit: Payer: Self-pay | Admitting: Oncology

## 2019-06-11 DIAGNOSIS — Z9889 Other specified postprocedural states: Secondary | ICD-10-CM

## 2019-07-07 ENCOUNTER — Ambulatory Visit: Payer: Medicare Other | Attending: Internal Medicine

## 2019-07-07 DIAGNOSIS — Z23 Encounter for immunization: Secondary | ICD-10-CM

## 2019-07-07 NOTE — Progress Notes (Signed)
   Covid-19 Vaccination Clinic  Name:  Darlene Kelly    MRN: [id number] DOB: 1950-08-14  07/07/2019  [name] was observed post Covid-19 immunization for 15 minutes without incident. She was provided with Vaccine Information Sheet and instruction to access the V-Safe system.   [name] was instructed to call 911 with any severe reactions post vaccine: Marland Kitchen Difficulty breathing  . Swelling of face and throat  . A fast heartbeat  . A bad rash all over body  . Dizziness and weakness   Immunizations Administered    Name Date Dose VIS Date Route   Pfizer COVID-19 Vaccine 07/07/2019 11:59 AM 0.3 mL 03/13/2019 Intramuscular   Manufacturer: [employer]   Lot: [id number]   [place]: [id number]

## 2019-08-05 ENCOUNTER — Other Ambulatory Visit: Payer: Self-pay

## 2019-08-05 ENCOUNTER — Ambulatory Visit
Admission: RE | Admit: 2019-08-05 | Discharge: 2019-08-05 | Disposition: A | Discharge status: Home / Self Care | Payer: Medicare Other | Source: Ambulatory Visit | Attending: Oncology | Admitting: Oncology

## 2019-08-05 DIAGNOSIS — Z9889 Other specified postprocedural states: Secondary | ICD-10-CM

## 2019-10-07 ENCOUNTER — Ambulatory Visit: Payer: Medicare Other

## 2019-10-07 ENCOUNTER — Other Ambulatory Visit: Payer: Self-pay

## 2019-10-12 ENCOUNTER — Ambulatory Visit: Payer: Medicare Other | Admitting: Cardiology

## 2019-10-12 ENCOUNTER — Encounter: Payer: Self-pay | Admitting: Cardiology

## 2019-10-12 ENCOUNTER — Other Ambulatory Visit: Payer: Self-pay

## 2019-10-12 VITALS — BP 168/89 | HR 64 | Ht 61.0 in | Wt 170.0 lb

## 2019-10-12 DIAGNOSIS — E7800 Pure hypercholesterolemia, unspecified: Secondary | ICD-10-CM

## 2019-10-12 DIAGNOSIS — I10 Essential (primary) hypertension: Secondary | ICD-10-CM

## 2019-10-12 DIAGNOSIS — Z87891 Personal history of nicotine dependence: Secondary | ICD-10-CM

## 2019-10-12 DIAGNOSIS — R0989 Other specified symptoms and signs involving the circulatory and respiratory systems: Secondary | ICD-10-CM

## 2019-10-12 DIAGNOSIS — I350 Nonrheumatic aortic (valve) stenosis: Secondary | ICD-10-CM

## 2019-10-12 DIAGNOSIS — Z853 Personal history of malignant neoplasm of breast: Secondary | ICD-10-CM

## 2019-10-12 DIAGNOSIS — E6609 Other obesity due to excess calories: Secondary | ICD-10-CM

## 2019-10-12 DIAGNOSIS — Z712 Person consulting for explanation of examination or test findings: Secondary | ICD-10-CM

## 2019-10-12 DIAGNOSIS — I447 Left bundle-branch block, unspecified: Secondary | ICD-10-CM

## 2019-10-12 DIAGNOSIS — Z8679 Personal history of other diseases of the circulatory system: Secondary | ICD-10-CM

## 2019-10-12 NOTE — Progress Notes (Signed)
Darlene Kelly Date of Birth: 09-Jan-1951 MRN: [id number] Primary Care Provider:Williams, Gracelyn Nurse, PA-C Former Cardiology Providers: Dr. Vear Clock, Jeri Lager, APRN, FNP-C Primary Cardiologist: Rex Kras, DO, [facility] (established care 10/12/2019)  Date: 10/12/19 Last Visit: 01/06/2019  Chief Complaint  Patient presents with  . Results    HPI  Darlene Kelly is a 69 y.o.  female who presents to the office with a chief complaint of "review test results." Patient's past medical history and cardiovascular risk factors include: Mild to moderate valvular heart disease, hypertension, hyperlipidemia, history of right-sided breast cancer status post chemo/radiation/surgery, former smoker, hx of rheumatic fever, postmenopausal female, advanced age.  In the past patient has been under the care of Dr. Vear Clock and Miquel Dunn and is now here to reestablish care with myself for the above-mentioned chief complaint.  Since last office visit patient states that she is doing well from a cardiovascular standpoint. She is status post left knee replacement and states that she is walking at least 4000 steps per day but not back to her original baseline. She does not have any effort related symptoms of chest pain, or shortness of breath. She denies the 3 cardinal symptoms of aortic stenosis (angina, congestive heart failure, or syncope). Patient has history of mild to moderate valvular heart disease for which she had an echocardiogram prior to this office visit. The findings are noted below for further reference and were discussed with her in great detail at today's visit.  Patient is requesting assistance in regards to the management of her hypertension.  Denies prior history of coronary artery disease, myocardial infarction, congestive heart failure, deep venous thrombosis, pulmonary embolism, stroke, transient ischemic attack.  FUNCTIONAL STATUS: After knee replacement she does about 4,000 steps  per day.    ALLERGIES: Allergies  Allergen Reactions  . Latex Itching, Dermatitis and Rash  . Lipitor [Atorvastatin] Other (See Comments)    Bad leg cramps     MEDICATION LIST PRIOR TO VISIT: Current Outpatient Medications on File Prior to Visit  Medication Sig Dispense Refill  . ASPIRIN 81 PO Take 1 tablet by mouth daily.    . Cholecalciferol (VITAMIN D3) 25 MCG (1000 UT) CAPS Take 1 capsule by mouth daily.    Marland Kitchen gabapentin (NEURONTIN) 300 MG capsule Take 1 capsule (300 mg total) by mouth 3 (three) times daily. Take a 300 mg capsule three times a day for two weeks Then a 300 mg capsule twice a day for two weeks Then a 300 mg capsule once a day for two weeks 84 capsule 0  . lisinopril (PRINIVIL,ZESTRIL) 10 MG tablet Take 10 mg by mouth at bedtime.   6  . metoprolol succinate (TOPROL-XL) 100 MG 24 hr tablet Take 100 mg by mouth every evening.     . Multiple Vitamins-Minerals (CENTRUM SILVER ADULT 50+ PO) Take 1 tablet by mouth daily.    . rosuvastatin (CRESTOR) 40 MG tablet Take 20 mg by mouth daily.     . tamoxifen (NOLVADEX) 20 MG tablet Take 1 tablet (20 mg total) by mouth daily. 90 tablet 4   No current facility-administered medications on file prior to visit.    PAST MEDICAL HISTORY: Past Medical History:  Diagnosis Date  . Breast cancer ([place])   . Breast cancer of upper-outer quadrant of right female breast ([place]) 07/16/2014  . Diabetes mellitus without complication ([place])   . Heart murmur   . Heart murmur    from rhumatic fever as a child  .  Hypertension   . Personal history of chemotherapy 2016  . Personal history of radiation therapy   . Skin cancer     PAST SURGICAL HISTORY: Past Surgical History:  Procedure Laterality Date  . BREAST BIOPSY Right 07/13/2014   malignant  . BREAST LUMPECTOMY Right 02/11/2015   malignant  . PORT-A-CATH REMOVAL Left 02/11/2015   Procedure: REMOVAL PORT-A-CATH;  Surgeon: [name], MD;  Location: [facility];  Service: General;   Laterality: Left;  . PORTACATH PLACEMENT N/A 08/03/2014   Procedure: INSERTION PORT-A-CATH;  Surgeon: [name], MD;  Location: [facility];  Service: General;  Laterality: N/A;  . RADIOACTIVE SEED GUIDED PARTIAL MASTECTOMY WITH AXILLARY SENTINEL LYMPH NODE BIOPSY Right 02/11/2015   Procedure: RIGHT RADIOACTIVE SEED LOCALIZATION LUMPECTOMY  WITH  RIGHT AXILLARY SENTINEL LYMPH NODE BIOPSY;  Surgeon: [name], MD;  Location: [facility];  Service: General;  Laterality: Right;  . TOTAL KNEE ARTHROPLASTY Left 01/19/2019   Procedure: TOTAL KNEE ARTHROPLASTY;  Surgeon: Gaynelle Arabian, MD;  Location: [facility];  Service: Orthopedics;  Laterality: Left;  48min    FAMILY HISTORY: The patient's family history includes Heart disease in her brother, father, and mother; Lung cancer in her father.   SOCIAL HISTORY:  The patient  reports that she quit smoking about 33 years ago. Her smoking use included cigarettes. She has a 6.00 pack-year smoking history. She has never used smokeless tobacco. She reports current alcohol use of about 3.0 standard drinks of alcohol per week. She reports that she does not use drugs.  Review of Systems  Constitutional: Negative for chills and fever.  HENT: Negative for hoarse voice and nosebleeds.   Eyes: Negative for discharge, double vision and pain.  Cardiovascular: Negative for chest pain, claudication, dyspnea on exertion, leg swelling, near-syncope, orthopnea, palpitations, paroxysmal nocturnal dyspnea and syncope.  Respiratory: Negative for hemoptysis and shortness of breath.   Musculoskeletal: Negative for muscle cramps and myalgias.  Gastrointestinal: Negative for abdominal pain, constipation, diarrhea, hematemesis, hematochezia, melena, nausea and vomiting.  Neurological: Negative for dizziness and light-headedness.   PHYSICAL EXAM: Vitals with BMI 10/12/2019 02/09/2019 01/21/2019  Height 5\' 1"  5\' 1"  -  Weight 170 lbs 165 lbs 10 oz -  BMI 22.29 79.89 -  Systolic  211 941 740  Diastolic 89 54 72  Pulse 64 74 77    CONSTITUTIONAL: Well-developed and well-nourished. No acute distress.  SKIN: Skin is warm and dry. No rash noted. No cyanosis. No pallor. No jaundice HEAD: Normocephalic and atraumatic.  EYES: No scleral icterus MOUTH/THROAT: Moist oral membranes.  NECK: No JVD present. No thyromegaly noted. Bilateral carotid bruits  LYMPHATIC: No visible cervical adenopathy.  CHEST Normal respiratory effort. No intercostal retractions  LUNGS: Clear to auscultation bilaterally. No stridor. No wheezes. No rales.  CARDIOVASCULAR: Regular rate and rhythm, positive C1-K4, soft holosystolic murmur heard at the apex, 3/6 systolic ejection murmur heard at the second right upper costal space, no gallops or rubs. ABDOMINAL: No apparent ascites.  EXTREMITIES: No peripheral edema  HEMATOLOGIC: No significant bruising NEUROLOGIC: Oriented to person, place, and time. Nonfocal. Normal muscle tone.  PSYCHIATRIC: Normal mood and affect. Normal behavior. Cooperative  CARDIAC DATABASE: EKG: 10/12/2019: Normal sinus rhythm, 75 bpm, left bundle branch block, poor R wave progression, without underlying injury pattern.  Compared to prior EKG left bundle branch block is new compared to prior EKG, but present on stress test imaging 01/12/2019.  Echocardiogram: 10/07/2019:  LVEF 55 to 60%, moderate LVH, indeterminate diastolic filling pattern, elevated left atrial  pressure Thickened and calcified aortic valve leaflets with reduced cusp separation.  Moderate aortic stenosis (peak velocity 3.5m/s, MG 26mmHG, AVA 0.9cm2, DI 0.3).   Mild aortic regurgitation.  Moderate calcification of the posterior mitral valve leaflet with normal excursion. No evidence of mitral stenosis. Mild (Grade I) mitral regurgitation.  Mild tricuspid regurgitation. Mild pulmonary hypertension. RVSP measures 36 mmHg.  Compared to prior study dated 10/08/2018: No significant change except new pulmonary  hypertension. Hemodynamics of AS has worsened but severity remains stable.    Stress Testing:  Lexiscan Myoview Stress Test 01/12/2019: Stress EKG is non-diagnostic, as this is a pharmacological stress test. In addition, Rest and stress EKG show sinus rhythm, LBBB.  SPECT images reveal basal to apical inferolateral, basal inferior, basal inferoseptal, seen both on stress and rest images. While tissue attenuation is present, ischemia in this region cannot be completely excluded.  Stress LVEF is 73%. Low risk study. No prior studies for comparison.  Heart Catheterization: None  LABORATORY DATA: CBC Latest Ref Rng & Units 02/02/2019 01/21/2019 01/20/2019  WBC 4.0 - 10.5 K/uL 6.2 11.7(H) 9.6  Hemoglobin 12.0 - 15.0 g/dL 10.7(L) 9.5(L) 10.5(L)  Hematocrit 36 - 46 % 32.4(L) 28.3(L) 32.1(L)  Platelets 150 - 400 K/uL 274 132(L) 161    CMP Latest Ref Rng & Units 02/02/2019 01/21/2019 01/20/2019  Glucose 70 - 99 mg/dL 142(H) 145(H) 171(H)  BUN 8 - 23 mg/dL 13 10 12   Creatinine 0.44 - 1.00 mg/dL 0.82 0.54 0.64  Sodium 135 - 145 mmol/L 139 124(L) 136  Potassium 3.5 - 5.1 mmol/L 4.8 3.6 4.1  Chloride 98 - 111 mmol/L 105 99 105  CO2 22 - 32 mmol/L 24 22 23   Calcium 8.9 - 10.3 mg/dL 9.6 8.2(L) 8.6(L)  Total Protein 6.5 - 8.1 g/dL 6.6 - -  Total Bilirubin 0.3 - 1.2 mg/dL 0.5 - -  Alkaline Phos 38 - 126 U/L 47 - -  AST 15 - 41 U/L 26 - -  ALT 0 - 44 U/L 38 - -    Lipid Panel  03/11/2018- cholesterol-127, LDL-78, HDL-43, triglycerides-80  Lab Results  Component Value Date   HGBA1C 6.1 (H) 01/09/2019   HGBA1C 5.3 02/03/2015   No components found for: NTPROBNP No results found for: TSH  Cardiac Panel (last 3 results) No results for input(s): CKTOTAL, CKMB, TROPONINIHS, RELINDX in the last 72 hours.  IMPRESSION:    ICD-10-CM   1. Moderate aortic stenosis  I35.0 EKG 12-Lead    PCV ECHOCARDIOGRAM COMPLETE  2. Hx of rheumatic fever  Z86.79   3. Essential hypertension  I10   4.  Hypercholesterolemia  E78.00   5. Hx of breast cancer  Z85.3   6. LBBB (left bundle branch block)  I44.7   7. Former smoker  Z87.891   21. Class 1 obesity due to excess calories with serious comorbidity and body mass index (BMI) of 32.0 to 32.9 in adult  E66.09    Z68.32   9. Encounter to discuss test results  Z71.2   10. Bilateral carotid bruits  R09.89 PCV CAROTID DUPLEX (BILATERAL)     RECOMMENDATIONS: Darlene Kelly is a 69 y.o. 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  Moderate aortic stenosis with history of rheumatic fever:  Severity of aortic stenosis remains stable since last evaluation.  Repeat echocardiogram to  reevaluate the severity of aortic stenosis in 1 year, July 2022.  Echo ordered.  Patient is educated on looking out for signs of congestive heart failure, angina pectoris, or syncope.  If she has symptoms she is asked to seek medical attention sooner than her next follow-up visit.  Bilateral carotid bruits:  The carotid bruits may also be due to delayed carotid upstrokes.  Given her other cardiovascular risk factors would recommend carotid duplex to evaluate for carotid artery atherosclerosis.   Benign essential hypertension: . Office blood pressure is at goal.  . Medication reconciled.  . Patient will be enrolled into principal care management for ambulatory blood pressure monitoring. . We will check a transfer 1 wee prior to uptitrating her medications. . Low salt diet recommended. A diet that is rich in fruits, vegetables, legumes, and low-fat dairy products and low in snacks, sweets, and meats (such as the Dietary Approaches to Stop Hypertension [DASH] diet).   LBBB: Continue to monitor.   Obesity, due to excess  calories: . Body mass index is 32.12 kg/m. . I reviewed with the patient the importance of diet, regular physical activity/exercise, weight loss.   . Patient is educated on increasing physical activity gradually as tolerated.  With the goal of moderate intensity exercise for 30 minutes a day 5 days a week.   FINAL MEDICATION LIST END OF ENCOUNTER: No orders of the defined types were placed in this encounter.    Current Outpatient Medications:  .  ASPIRIN 81 PO, Take 1 tablet by mouth daily., Disp: , Rfl:  .  Cholecalciferol (VITAMIN D3) 25 MCG (1000 UT) CAPS, Take 1 capsule by mouth daily., Disp: , Rfl:  .  gabapentin (NEURONTIN) 300 MG capsule, Take 1 capsule (300 mg total) by mouth 3 (three) times daily. Take a 300 mg capsule three times a day for two weeks Then a 300 mg capsule twice a day for two weeks Then a 300 mg capsule once a day for two weeks, Disp: 84 capsule, Rfl: 0 .  lisinopril (PRINIVIL,ZESTRIL) 10 MG tablet, Take 10 mg by mouth at bedtime. , Disp: , Rfl: 6 .  metoprolol succinate (TOPROL-XL) 100 MG 24 hr tablet, Take 100 mg by mouth every evening. , Disp: , Rfl:  .  Multiple Vitamins-Minerals (CENTRUM SILVER ADULT 50+ PO), Take 1 tablet by mouth daily., Disp: , Rfl:  .  rosuvastatin (CRESTOR) 40 MG tablet, Take 20 mg by mouth daily. , Disp: , Rfl:  .  tamoxifen (NOLVADEX) 20 MG tablet, Take 1 tablet (20 mg total) by mouth daily., Disp: 90 tablet, Rfl: 4  Orders Placed This Encounter  Procedures  . EKG 12-Lead  . PCV ECHOCARDIOGRAM COMPLETE  . PCV CAROTID DUPLEX (BILATERAL)   --Continue cardiac medications as reconciled in final medication list. --Return in about 4 weeks (around 11/09/2019) for BP follow up.. Or sooner if needed. --Continue follow-up with your primary care physician regarding the management of your other chronic comorbid conditions.  Patient's questions and concerns were addressed to her satisfaction. She voices understanding of the instructions provided  during this encounter.   This note was created using a voice recognition software as a result there may be grammatical errors inadvertently enclosed that do not reflect the nature of this encounter. Every attempt is made to correct such errors.  Rex Kras, [place], [facility]  Pager: [phone] Office: [phone]

## 2019-10-13 ENCOUNTER — Ambulatory Visit: Payer: Medicare Other | Admitting: Cardiology

## 2019-10-15 ENCOUNTER — Other Ambulatory Visit: Payer: Self-pay

## 2019-10-15 ENCOUNTER — Ambulatory Visit: Payer: Medicare Other

## 2019-10-15 ENCOUNTER — Other Ambulatory Visit: Payer: Medicare Other

## 2019-10-15 DIAGNOSIS — R0989 Other specified symptoms and signs involving the circulatory and respiratory systems: Secondary | ICD-10-CM

## 2019-10-15 DIAGNOSIS — I6523 Occlusion and stenosis of bilateral carotid arteries: Secondary | ICD-10-CM

## 2019-10-18 ENCOUNTER — Other Ambulatory Visit: Payer: Self-pay | Admitting: Cardiology

## 2019-10-18 DIAGNOSIS — I6523 Occlusion and stenosis of bilateral carotid arteries: Secondary | ICD-10-CM

## 2019-10-22 ENCOUNTER — Telehealth: Payer: Self-pay

## 2019-10-22 NOTE — Telephone Encounter (Signed)
-----   Message from [place], [place] sent at 10/22/2019 12:43 PM EDT ----- Bilateral carotid artery stenosis less than 70%.Needs 43-month follow-up study for reevaluation.We will discuss the details/management at the next office visit.

## 2019-10-22 NOTE — Telephone Encounter (Signed)
Carotid scheduled same day as her echo on 04/19/2019. Please add carotid order. Thanks

## 2019-11-09 ENCOUNTER — Ambulatory Visit: Payer: Medicare Other | Admitting: Cardiology

## 2019-11-09 ENCOUNTER — Other Ambulatory Visit: Payer: Self-pay

## 2019-11-09 ENCOUNTER — Encounter: Payer: Self-pay | Admitting: Cardiology

## 2019-11-09 VITALS — BP 159/72 | HR 65 | Resp 16 | Ht 61.0 in | Wt 170.4 lb

## 2019-11-09 DIAGNOSIS — I447 Left bundle-branch block, unspecified: Secondary | ICD-10-CM

## 2019-11-09 DIAGNOSIS — I350 Nonrheumatic aortic (valve) stenosis: Secondary | ICD-10-CM

## 2019-11-09 DIAGNOSIS — Z87891 Personal history of nicotine dependence: Secondary | ICD-10-CM

## 2019-11-09 DIAGNOSIS — Z853 Personal history of malignant neoplasm of breast: Secondary | ICD-10-CM

## 2019-11-09 DIAGNOSIS — E6609 Other obesity due to excess calories: Secondary | ICD-10-CM

## 2019-11-09 DIAGNOSIS — Z8679 Personal history of other diseases of the circulatory system: Secondary | ICD-10-CM

## 2019-11-09 DIAGNOSIS — E7800 Pure hypercholesterolemia, unspecified: Secondary | ICD-10-CM

## 2019-11-09 DIAGNOSIS — I10 Essential (primary) hypertension: Secondary | ICD-10-CM

## 2019-11-09 DIAGNOSIS — Z712 Person consulting for explanation of examination or test findings: Secondary | ICD-10-CM

## 2019-11-09 DIAGNOSIS — I6523 Occlusion and stenosis of bilateral carotid arteries: Secondary | ICD-10-CM

## 2019-11-09 NOTE — Progress Notes (Signed)
Darlene Kelly Date of Birth: Dec 31, 1950 MRN: [id number] Primary Care Provider:Williams, Gracelyn Nurse, PA-C Former Cardiology Providers: Dr. Vear Clock, Jeri Lager, APRN, FNP-C Primary Cardiologist: Rex Kras, DO, [facility] (established care 10/12/2019)  Date: 11/09/19 Last Office Visit: 10/12/2019  Chief Complaint  Patient presents with  . Aortic Stenosis  . Follow-up    4 week  . Results  . Hypertension    HPI  Darlene Kelly is a 69 y.o.  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  In the past patient has been under the care of Dr. Vear Clock and Miquel Dunn and reestablished care with myself for management of aortic stenosis and hypertension.  At the last office visit reviewed the results of the echocardiogram which noted she has underlying aortic stenosis.  However, patient remains asymptomatic in regards to no symptoms of angina pectoris, heart failure symptoms, or syncope.  Patient has a repeat echocardiogram pending for reevaluation of aortic stenosis.  During the last office visit patient was noted to have carotid bruits which are most likely attributed to her underlying aortic stenosis but carotid atherosclerosis could not be ruled out.  Patient was recommended to undergo carotid duplex.  And since last office visit she is noted to have bilateral carotid artery stenosis less than 70%.  At last office visit patient requested that we help her manage her hypertension.  She was enrolled into the ambulatory blood pressure monitoring.  Her medications were titrated as  well.  And her average blood pressure for the last 1 month is 125/71 with a heart rate of 60 bpm.  Patient states that she feels great and has also been able to increase her physical activity.  Patient now walks at least 2 to 4 miles per day and has also joined the [facility].  Denies prior history of coronary artery disease, myocardial infarction, congestive heart failure, deep venous thrombosis, pulmonary embolism, stroke, transient ischemic attack.  FUNCTIONAL STATUS: After knee replacement she does about 4,000 steps per day. However, since the last office visit she walks 2-4 miles per day and has joined the [employer].   ALLERGIES: Allergies  Allergen Reactions  . Latex Itching, Dermatitis and Rash  . Lipitor [Atorvastatin] Other (See Comments)    Bad leg cramps   MEDICATION LIST PRIOR TO VISIT: Current Outpatient Medications on File Prior to Visit  Medication Sig Dispense Refill  . ASPIRIN 81 PO Take 1 tablet by mouth daily.    . Cholecalciferol (VITAMIN D3) 25 MCG (1000 UT) CAPS Take 1 capsule by mouth daily.    Marland Kitchen gabapentin (NEURONTIN) 300 MG capsule Take 1 capsule (300 mg total) by mouth 3 (three) times daily. Take a 300 mg capsule three times a day for two weeks Then a 300 mg capsule twice a day for two weeks Then a 300 mg capsule once a day for two weeks 84 capsule 0  . lisinopril (PRINIVIL,ZESTRIL) 10 MG tablet Take 10 mg by mouth at bedtime.   6  . metoprolol succinate (TOPROL-XL) 100 MG 24 hr tablet Take 100 mg by mouth every evening.     . Multiple Vitamins-Minerals (CENTRUM SILVER ADULT 50+ PO) Take 1 tablet by mouth daily.    Marland Kitchen  rosuvastatin (CRESTOR) 20 MG tablet Take 20 mg by mouth daily.     . tamoxifen (NOLVADEX) 20 MG tablet Take 1 tablet (20 mg total) by mouth daily. 90 tablet 4   No current facility-administered medications on file prior to visit.    PAST MEDICAL HISTORY: Past Medical History:  Diagnosis Date  . Breast cancer ([place])   . Breast cancer of upper-outer quadrant  of right female breast ([place]) 07/16/2014  . Diabetes mellitus without complication ([place])   . Heart murmur   . Heart murmur    from rhumatic fever as a child  . Hypertension   . Personal history of chemotherapy 2016  . Personal history of radiation therapy   . Skin cancer     PAST SURGICAL HISTORY: Past Surgical History:  Procedure Laterality Date  . BREAST BIOPSY Right 07/13/2014   malignant  . BREAST LUMPECTOMY Right 02/11/2015   malignant  . PORT-A-CATH REMOVAL Left 02/11/2015   Procedure: REMOVAL PORT-A-CATH;  Surgeon: [name], MD;  Location: [place];  Service: General;  Laterality: Left;  . PORTACATH PLACEMENT N/A 08/03/2014   Procedure: INSERTION PORT-A-CATH;  Surgeon: [name], MD;  Location: [facility];  Service: General;  Laterality: N/A;  . RADIOACTIVE SEED GUIDED PARTIAL MASTECTOMY WITH AXILLARY SENTINEL LYMPH NODE BIOPSY Right 02/11/2015   Procedure: RIGHT RADIOACTIVE SEED LOCALIZATION LUMPECTOMY  WITH  RIGHT AXILLARY SENTINEL LYMPH NODE BIOPSY;  Surgeon: [name], MD;  Location: [place];  Service: General;  Laterality: Right;  . TOTAL KNEE ARTHROPLASTY Left 01/19/2019   Procedure: TOTAL KNEE ARTHROPLASTY;  Surgeon: Gaynelle Arabian, MD;  Location: [facility];  Service: Orthopedics;  Laterality: Left;  60min    FAMILY HISTORY: The patient's family history includes Heart disease in her brother, father, and mother; Lung cancer in her father.   SOCIAL HISTORY:  The patient  reports that she quit smoking about 33 years ago. Her smoking use included cigarettes. She has a 6.00 pack-year smoking history. She has never used smokeless tobacco. She reports current alcohol use of about 3.0 standard drinks of alcohol per week. She reports that she does not use drugs.  Review of Systems  Constitutional: Negative for chills and fever.  HENT: Negative for hoarse voice and nosebleeds.   Eyes: Negative for discharge, double vision and pain.  Cardiovascular: Negative for  chest pain, claudication, dyspnea on exertion, leg swelling, near-syncope, orthopnea, palpitations, paroxysmal nocturnal dyspnea and syncope.  Respiratory: Negative for hemoptysis and shortness of breath.   Musculoskeletal: Negative for muscle cramps and myalgias.  Gastrointestinal: Negative for abdominal pain, constipation, diarrhea, hematemesis, hematochezia, melena, nausea and vomiting.  Neurological: Negative for dizziness and light-headedness.   PHYSICAL EXAM: Vitals with BMI 11/09/2019 10/12/2019 02/09/2019  Height 5\' 1"  5\' 1"  5\' 1"   Weight 170 lbs 6 oz 170 lbs 165 lbs 10 oz  BMI 32.21 00.93 81.82  Systolic 993 716 967  Diastolic 72 89 54  Pulse 65 64 74    CONSTITUTIONAL: Well-developed and well-nourished. No acute distress.  SKIN: Skin is warm and dry. No rash noted. No cyanosis. No pallor. No jaundice HEAD: Normocephalic and atraumatic.  EYES: No scleral icterus MOUTH/THROAT: Moist oral membranes.  NECK: No JVD present. No thyromegaly noted. Bilateral carotid bruits  LYMPHATIC: No visible cervical adenopathy.  CHEST Normal respiratory effort. No intercostal retractions  LUNGS: Clear to auscultation bilaterally. No stridor. No wheezes. No rales.  CARDIOVASCULAR: Regular rate and rhythm, positive E9-F8, soft holosystolic murmur heard at the apex, 3/6 systolic ejection murmur  heard at the second right upper costal space, no gallops or rubs. ABDOMINAL: No apparent ascites.  EXTREMITIES: No peripheral edema  HEMATOLOGIC: No significant bruising NEUROLOGIC: Oriented to person, place, and time. Nonfocal. Normal muscle tone.  PSYCHIATRIC: Normal mood and affect. Normal behavior. Cooperative  CARDIAC DATABASE: EKG: 10/12/2019: Normal sinus rhythm, 75 bpm, left bundle branch block, poor R wave progression, without underlying injury pattern.  Compared to prior EKG left bundle branch block is new compared to prior EKG, but present on stress test imaging  01/12/2019.  Echocardiogram: 10/07/2019:  LVEF 55 to 60%, moderate LVH, indeterminate diastolic filling pattern, elevated left atrial pressure Thickened and calcified aortic valve leaflets with reduced cusp separation.  Moderate aortic stenosis (peak velocity 3.42m/s, MG 32mmHG, AVA 0.9cm2, DI 0.3).   Mild aortic regurgitation.  Moderate calcification of the posterior mitral valve leaflet with normal excursion. No evidence of mitral stenosis. Mild (Grade I) mitral regurgitation.  Mild tricuspid regurgitation. Mild pulmonary hypertension. RVSP measures 36 mmHg.  Compared to prior study dated 10/08/2018: No significant change except new pulmonary hypertension. Hemodynamics of AS has worsened but severity remains stable.    Stress Testing:  Lexiscan Myoview Stress Test 01/12/2019: Stress EKG is non-diagnostic, as this is a pharmacological stress test. In addition, Rest and stress EKG show sinus rhythm, LBBB.  SPECT images reveal basal to apical inferolateral, basal inferior, basal inferoseptal, seen both on stress and rest images. While tissue attenuation is present, ischemia in this region cannot be completely excluded.  Stress LVEF is 73%. Low risk study. No prior studies for comparison.  Heart Catheterization: None  Carotid artery duplex 10/15/2019:  Stenosis in the right internal carotid artery (50-69%). Stenosis in the right external carotid artery (<50%).  Senosis in the left internal carotid artery (50-69%).  Antegrade right vertebral artery flow. Antegrade left vertebral artery flow.  Follow up in six months is appropriate if clinically indicated.  LABORATORY DATA: CBC Latest Ref Rng & Units 02/02/2019 01/21/2019 01/20/2019  WBC 4.0 - 10.5 K/uL 6.2 11.7(H) 9.6  Hemoglobin 12.0 - 15.0 g/dL 10.7(L) 9.5(L) 10.5(L)  Hematocrit 36 - 46 % 32.4(L) 28.3(L) 32.1(L)  Platelets 150 - 400 K/uL 274 132(L) 161    CMP Latest Ref Rng & Units 02/02/2019 01/21/2019 01/20/2019  Glucose 70 - 99  mg/dL 142(H) 145(H) 171(H)  BUN 8 - 23 mg/dL 13 10 12   Creatinine 0.44 - 1.00 mg/dL 0.82 0.54 0.64  Sodium 135 - 145 mmol/L 139 124(L) 136  Potassium 3.5 - 5.1 mmol/L 4.8 3.6 4.1  Chloride 98 - 111 mmol/L 105 99 105  CO2 22 - 32 mmol/L 24 22 23   Calcium 8.9 - 10.3 mg/dL 9.6 8.2(L) 8.6(L)  Total Protein 6.5 - 8.1 g/dL 6.6 - -  Total Bilirubin 0.3 - 1.2 mg/dL 0.5 - -  Alkaline Phos 38 - 126 U/L 47 - -  AST 15 - 41 U/L 26 - -  ALT 0 - 44 U/L 38 - -    Lipid Panel  03/11/2018- cholesterol-127, LDL-78, HDL-43, triglycerides-80  Lab Results  Component Value Date   HGBA1C 6.1 (H) 01/09/2019   HGBA1C 5.3 02/03/2015   No components found for: NTPROBNP No results found for: TSH  Cardiac Panel (last 3 results) No results for input(s): CKTOTAL, CKMB, TROPONINIHS, RELINDX in the last 72 hours.  IMPRESSION:    ICD-10-CM   1. Essential hypertension  I10   2. Moderate aortic stenosis  I35.0   3. Hx of rheumatic fever  Z86.79  4. LBBB (left bundle branch block)  I44.7   5. Asymptomatic bilateral carotid artery stenosis  I65.23   6. Hypercholesterolemia  E78.00   7. Former smoker  Z87.891   71. Class 1 obesity due to excess calories with serious comorbidity and body mass index (BMI) of 32.0 to 32.9 in adult  E66.09    Z68.32   9. Hx of breast cancer  Z85.3      RECOMMENDATIONS: Darlene Kelly is a 70 y.o. 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  Moderate aortic stenosis with history of rheumatic fever:  Severity of aortic stenosis remains stable since last evaluation.  Repeat echocardiogram to reevaluate the severity of aortic stenosis in 1 year, July 2022.  Echo ordered.  Patient is  educated on looking out for signs of congestive heart failure, angina pectoris, or syncope.  If she has symptoms she is asked to seek medical attention sooner than her next follow-up visit.  Bilateral carotid artery atherosclerosis:  Carotid duplex results reviewed with the patient at today's office visit.  Plan repeat Carotid Duplex in January 2022, order placed.   Continue ASA and Statin.   We also discussed rechecking her fasting lipid profile given her new diagnosis of bilateral carotid artery atherosclerosis at today's office visit.  However, patient states that she would like to work on lifestyle modifications and she will have repeat blood work done in December 2021 prior to her yearly annual checkup.  She is encouraged to bring her labs in the next office visit for review.   Benign essential hypertension: . During her office visit in July 2021 patient requested that we help manage her hypertension. . Office blood pressure is not at goal.  . Home blood pressures at goal. As per ambulatory blood pressure monitoring average BP is 125/71 and pulse 60 bpm.  . Medication reconciled.  . Continue to monitor in principal care management for ambulatory blood pressure monitoring. . Low salt diet recommended. A diet that is rich in fruits, vegetables, legumes, and low-fat dairy products and low in snacks, sweets, and meats (such as the Dietary Approaches to Stop Hypertension [DASH] diet).   LBBB: Continue to monitor.   Obesity, due to excess calories: Body mass index is 32.2 kg/m. . I reviewed with the patient the importance of diet, regular physical activity/exercise, weight loss.   . Patient is educated on increasing physical activity gradually as tolerated.  With the goal of moderate intensity exercise for 30 minutes a day 5 days a week.   FINAL MEDICATION LIST END OF ENCOUNTER: No orders of the defined types were placed in this encounter.    Current Outpatient Medications:  .   ASPIRIN 81 PO, Take 1 tablet by mouth daily., Disp: , Rfl:  .  Cholecalciferol (VITAMIN D3) 25 MCG (1000 UT) CAPS, Take 1 capsule by mouth daily., Disp: , Rfl:  .  gabapentin (NEURONTIN) 300 MG capsule, Take 1 capsule (300 mg total) by mouth 3 (three) times daily. Take a 300 mg capsule three times a day for two weeks Then a 300 mg capsule twice a day for two weeks Then a 300 mg capsule once a day for two weeks, Disp: 84 capsule, Rfl: 0 .  lisinopril (PRINIVIL,ZESTRIL) 10 MG tablet, Take 10 mg by mouth at bedtime. , Disp: ,  Rfl: 6 .  metoprolol succinate (TOPROL-XL) 100 MG 24 hr tablet, Take 100 mg by mouth every evening. , Disp: , Rfl:  .  Multiple Vitamins-Minerals (CENTRUM SILVER ADULT 50+ PO), Take 1 tablet by mouth daily., Disp: , Rfl:  .  rosuvastatin (CRESTOR) 20 MG tablet, Take 20 mg by mouth daily. , Disp: , Rfl:  .  tamoxifen (NOLVADEX) 20 MG tablet, Take 1 tablet (20 mg total) by mouth daily., Disp: 90 tablet, Rfl: 4  No orders of the defined types were placed in this encounter.  --Continue cardiac medications as reconciled in final medication list. --Return in about 25 weeks (around 05/02/2020) for BP, carotid disease, and AS follow up.. Or sooner if needed. --Continue follow-up with your primary care physician regarding the management of your other chronic comorbid conditions.  Patient's questions and concerns were addressed to her satisfaction. She voices understanding of the instructions provided during this encounter.   This note was created using a voice recognition software as a result there may be grammatical errors inadvertently enclosed that do not reflect the nature of this encounter. Every attempt is made to correct such errors.  Rex Kras, [place], [facility]  Pager: [phone] Office: [phone]

## 2019-11-09 NOTE — Patient Instructions (Signed)
Bring in your labs from your PCP at the next office visit.

## 2020-02-08 ENCOUNTER — Other Ambulatory Visit: Payer: Self-pay

## 2020-02-08 DIAGNOSIS — C50411 Malignant neoplasm of upper-outer quadrant of right female breast: Secondary | ICD-10-CM

## 2020-02-08 DIAGNOSIS — Z170 Estrogen receptor positive status [ER+]: Secondary | ICD-10-CM

## 2020-02-08 NOTE — Progress Notes (Signed)
[place]  [phone] Fax:(336) [phone]     ID: Darlene Kelly DOB: 06-May-1950  MR#: [id number]  MHD#:[id number]  Patient Care Team: Merwyn Katos as PCP - General (Physician Assistant) Pearse Shiffler, [name], MD as Consulting Physician (Oncology) [name], MD as Consulting Physician (Radiation Oncology) [name], RN as Registered Nurse [name], RN as Registered Nurse [name], [name], MD as Referring Physician (Cardiology) [name], Jennefer Bravo, MD as Referring Physician (Dermatology) Key, Nelia Shi, NP as Nurse Practitioner (Gynecology) OTHER MD:  CHIEF COMPLAINT: Estrogen receptor positive breast cancer  CURRENT TREATMENT: Completing 5 years of tamoxifen   INTERVAL HISTORY: Darlene Kelly returns today for follow-up of her estrogen receptor positive breast cancer.  This is her "graduation" visit.  She continues on Tamoxifen, which she tolerates well.  She does not have problems with hot flashes or significant vaginal discharge.  Since her last visit, she underwent bilateral diagnostic mammography with tomography at [facility] on 08/05/2019 showing: breast density category C; no evidence of malignancy in either breast.   REVIEW OF SYSTEMS: Darlene Kelly walks 3 to 4 miles a day and then she goes to her daughter's gym and works on [employer] and strengthening exercises.  She and her husband are fully retired and enjoying their retirement.  A detailed review of systems was otherwise noncontributory   COVID 19 VACCINATION STATUS: Status post Pfizer x2, most recently April 2029   BREAST CANCER HISTORY: From the original intake note:  Darlene Kelly first noted a change in her right breast September 2015. At that time however she was consumed by the care of her mother-in-law, who had severe Alzheimer's disease. She finally passed in January 2016, but [name] did not share the information regarding the right breast with her husband  until late March 2016. At that point she was set up for bilateral diagnostic mammography with tomography at the [facility] Center, for 11/20/2014. This found the breast density to be category B. In the upper outer quadrant of the right breast there was a 4 cm irregular spiculated mass. There were no other findings of concern. The mass was palpable and firm, and by ultrasound measured 3.3 cm. The right axilla was negative sonographically.  Biopsy of the right breast mass in question was obtained for 03/22/2015. 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  On 07/20/2014 the patient underwent bilateral breast MRI. This measured the large irregular enhancing mass in the right breast at the 10:00 position at 5.0 cm. There were no additional worrisome findings in the right breast, and no findings in the left breast or any abnormal appearing lymph nodes.  The patient's subsequent history is as detailed below   PAST MEDICAL HISTORY: Past Medical History:  Diagnosis Date  . Breast cancer ([place])   . Breast cancer of upper-outer quadrant of right female breast ([place]) 07/16/2014  . Diabetes mellitus without complication ([place])   . Heart murmur   . Heart murmur    from rhumatic fever as a child  . Hypertension   . Personal history of chemotherapy 2016  . Personal history of radiation therapy   . Skin cancer     PAST SURGICAL HISTORY: Past Surgical History:  Procedure Laterality Date  . BREAST BIOPSY Right 07/13/2014   malignant  . BREAST LUMPECTOMY Right 02/11/2015   malignant  . PORT-A-CATH REMOVAL Left 02/11/2015  Procedure: REMOVAL PORT-A-CATH;  Surgeon: [name], MD;  Location: [facility];  Service: General;  Laterality: Left;  . PORTACATH PLACEMENT N/A 08/03/2014   Procedure: INSERTION PORT-A-CATH;  Surgeon: [name], MD;  Location: [facility];  Service: General;  Laterality: N/A;  . RADIOACTIVE SEED GUIDED PARTIAL MASTECTOMY WITH AXILLARY SENTINEL LYMPH NODE BIOPSY Right 02/11/2015   Procedure: RIGHT RADIOACTIVE SEED LOCALIZATION LUMPECTOMY  WITH  RIGHT AXILLARY SENTINEL LYMPH NODE BIOPSY;  Surgeon: [name], MD;  Location: [facility];  Service: General;  Laterality: Right;  . TOTAL KNEE ARTHROPLASTY Left 01/19/2019   Procedure: TOTAL KNEE ARTHROPLASTY;  Surgeon: Gaynelle Arabian, MD;  Location: [facility];  Service: Orthopedics;  Laterality: Left;  73mn    FAMILY HISTORY Family History  Problem Relation Age of Onset  . Heart disease Mother   . Lung cancer Father   . Heart disease Father   . Heart disease Brother    the patient's father died from lung cancer in the setting of tobacco abuse at the age of 674 The patient's mother died from COPD at the age of 813 The patient has 3 brothers, no sisters. There is no history of breast or ovarian cancer in the family.   GYNECOLOGIC HISTORY:  No LMP recorded. Patient is postmenopausal. Menarche age 69 first live birth age 89 She is GX P3. She went through the change of life in her late 4109s She did not take hormone replacement.   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  Her husband BDarnell Levelworked as rProgrammer, systemsfor a m[employer] Industriesbut he has also retired.  They have been married more than 40 years. Son JVonna Kotyk "Josh" BShatonia Hootsis a cEnglish as a second language teacherin [employer] Son MRodman Key"Matt" BEllieana Doleckiis a mDealerin [place] Daughter KAnalis Distlerruns a gym [place] The patient has 3 grandchildren.    ADVANCED DIRECTIVES: In place   HEALTH MAINTENANCE: Social History   Tobacco Use  . Smoking status: Former Smoker    Packs/day: 0.50    Years: 12.00    Pack years: 6.00    Types: Cigarettes    Quit date: 04/02/1986    Years since quitting: 33.8  . Smokeless tobacco:  Never Used  Vaping Use  . Vaping Use: Never used  Substance Use Topics  . Alcohol use: Yes    Alcohol/week: 3.0 standard drinks    Types: 3 Glasses of wine per week    Comment: occ  . Drug use: No     Colonoscopy: Never  PAP:  Bone density: 04/05/2015  Lipid panel:  Allergies  Allergen Reactions  . Latex Itching, Dermatitis and Rash  . Lipitor [Atorvastatin] Other (See Comments)    Bad leg cramps    Current Outpatient Medications  Medication Sig Dispense Refill  . ASPIRIN 81 PO Take 1 tablet by mouth daily.    . Cholecalciferol (VITAMIN D3) 25 MCG (1000 UT) CAPS Take 1 capsule by mouth daily.    .Marland Kitchengabapentin (NEURONTIN) 300 MG capsule Take 1 capsule (300 mg total) by mouth 3 (three) times daily. Take a 300 mg capsule three times a day for two weeks Then a 300 mg capsule twice a day for two weeks Then a 300 mg capsule once a day for two weeks 84 capsule 0  . lisinopril (PRINIVIL,ZESTRIL) 10 MG tablet Take 10 mg by mouth at bedtime.   6  .  metoprolol succinate (TOPROL-XL) 100 MG 24 hr tablet Take 100 mg by mouth every evening.     . Multiple Vitamins-Minerals (CENTRUM SILVER ADULT 50+ PO) Take 1 tablet by mouth daily.    . rosuvastatin (CRESTOR) 20 MG tablet Take 20 mg by mouth daily.      No current facility-administered medications for this visit.    OBJECTIVE: White woman in no acute distress Vitals:   02/09/20 0917  BP: (!) 154/72  Resp: 18  Temp: (!) 97.4 F (36.3 C)  SpO2: 98%     Body mass index is 31.78 kg/m.    ECOG FS:1 - Symptomatic but completely ambulatory  Sclerae unicteric, EOMs intact Wearing a mask No cervical or supraclavicular adenopathy Lungs no rales or rhonchi Heart regular rate and rhythm Abd soft, nontender, positive bowel sounds MSK no focal spinal tenderness, no upper extremity lymphedema Neuro: nonfocal, well oriented, appropriate affect Breasts: The right breast is status post surgery and radiation.  There is no evidence of disease  recurrence.  There are the expected irregularities and areas with scar tissue as previously noted.  The left breast is benign.  Both axillae are benign.   LAB RESULTS:   CMP     Component Value Date/Time   NA 139 02/02/2019 0754   NA 139 01/21/2017 1142   K 4.8 02/02/2019 0754   K 4.9 01/21/2017 1142   CL 105 02/02/2019 0754   CO2 24 02/02/2019 0754   CO2 26 01/21/2017 1142   GLUCOSE 142 (H) 02/02/2019 0754   GLUCOSE 113 01/21/2017 1142   BUN 13 02/02/2019 0754   BUN 18.7 01/21/2017 1142   CREATININE 0.82 02/02/2019 0754   CREATININE 0.8 01/21/2017 1142   CALCIUM 9.6 02/02/2019 0754   CALCIUM 10.3 01/21/2017 1142   PROT 6.6 02/02/2019 0754   PROT 7.4 01/21/2017 1142   ALBUMIN 3.2 (L) 02/02/2019 0754   ALBUMIN 4.1 01/21/2017 1142   AST 26 02/02/2019 0754   AST 24 01/21/2017 1142   ALT 38 02/02/2019 0754   ALT 21 01/21/2017 1142   ALKPHOS 47 02/02/2019 0754   ALKPHOS 73 01/21/2017 1142   BILITOT 0.5 02/02/2019 0754   BILITOT 0.68 01/21/2017 1142   GFRNONAA >60 02/02/2019 0754   GFRAA >60 02/02/2019 0754    INo results found for: SPEP, UPEP  Lab Results  Component Value Date   WBC 6.4 02/09/2020   NEUTROABS 4.4 02/09/2020   HGB 13.8 02/09/2020   HCT 40.0 02/09/2020   MCV 90.7 02/09/2020   PLT 186 02/09/2020      Chemistry      Component Value Date/Time   NA 139 02/02/2019 0754   NA 139 01/21/2017 1142   K 4.8 02/02/2019 0754   K 4.9 01/21/2017 1142   CL 105 02/02/2019 0754   CO2 24 02/02/2019 0754   CO2 26 01/21/2017 1142   BUN 13 02/02/2019 0754   BUN 18.7 01/21/2017 1142   CREATININE 0.82 02/02/2019 0754   CREATININE 0.8 01/21/2017 1142      Component Value Date/Time   CALCIUM 9.6 02/02/2019 0754   CALCIUM 10.3 01/21/2017 1142   ALKPHOS 47 02/02/2019 0754   ALKPHOS 73 01/21/2017 1142   AST 26 02/02/2019 0754   AST 24 01/21/2017 1142   ALT 38 02/02/2019 0754   ALT 21 01/21/2017 1142   BILITOT 0.5 02/02/2019 0754   BILITOT 0.68 01/21/2017 1142         No results found for: LABCA2  No components  found for: [id number]  No results for input(s): INR in the last 168 hours.  Urinalysis No results found for: COLORURINE, APPEARANCEUR, LABSPEC, PHURINE, GLUCOSEU, HGBUR, BILIRUBINUR, KETONESUR, PROTEINUR, UROBILINOGEN, NITRITE, LEUKOCYTESUR   STUDIES: No results found.   ASSESSMENT: 69 y.o. [place] woman status post right breast upper outer quadrant biopsy 07/13/2014, for a clinical T3 N0, stage IIB invasive ductal carcinoma, grade 3, estrogen receptor positive, HER-2 and progesterone receptor negative, with an MIB-1 of 40%  (1) neoadjuvant chemotherapy starting 08/15/2013, consisting of doxorubicin and cyclophosphamide in dose dense fashion 4, completed 09/28/2014, followed by weekly paclitaxel 12 starting 10/19/2014, completed 01/04/2015  (2) status post right lumpectomy and sentinel lymph node sampling 02/11/2015 for a ypTis ypN0 ductal carcinoma in situ, with negative margins. This counts as a complete pathologic response  (3) adjuvant radiation completed 05/03/2015  (4) tamoxifen started 05/25/2015  (a) bone density 04/05/2015 was normal with a T score of -0.9   PLAN: Darlene Kelly is now 5 years out from definitive surgery for her breast cancer with no evidence of disease recurrence.  This is very favorable.  She will complete 5 years of tamoxifen January 2022.  She just got a 90-day supply so she will simply take those and discontinue it.  There is no need to taper to off.  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  She was very effective and very well involved.  At this point I feel comfortable releasing her to her primary care physicians.  All she will need in terms of breast cancer follow-up is a yearly mammography and a yearly physician breast exam  I will be glad to see Darlene Kelly again at any point in the future but as of now are making  no further routine appointments for her here.  Total encounter time 20 minutes.  Burdette Gergely, [name], MD  02/09/20 9:36 AM Medical Oncology and Hematology [facility] [place], [place] [address] Tel. [phone]    Fax. [phone]   I, Wilburn Mylar, am acting as scribe for Dr. [name]. [place] Pelto.  I, Lurline Del MD, have reviewed the above documentation for accuracy and completeness, and I agree with the above.   *Total Encounter Time as defined by the Centers for Medicare and Medicaid Services includes, in addition to the face-to-face time of a patient visit (documented in the note above) non-face-to-face time: obtaining and reviewing outside history, ordering and reviewing medications, tests or procedures, care coordination (communications with other health care professionals or caregivers) and documentation in the medical record.

## 2020-02-09 ENCOUNTER — Other Ambulatory Visit: Payer: Self-pay

## 2020-02-09 ENCOUNTER — Inpatient Hospital Stay (HOSPITAL_BASED_OUTPATIENT_CLINIC_OR_DEPARTMENT_OTHER): Payer: Medicare Other | Admitting: Oncology

## 2020-02-09 ENCOUNTER — Inpatient Hospital Stay: Payer: Medicare Other | Attending: Oncology

## 2020-02-09 VITALS — BP 154/72 | Temp 97.4°F | Resp 18 | Ht 61.0 in | Wt 168.2 lb

## 2020-02-09 DIAGNOSIS — M8580 Other specified disorders of bone density and structure, unspecified site: Secondary | ICD-10-CM | POA: Diagnosis not present

## 2020-02-09 DIAGNOSIS — E119 Type 2 diabetes mellitus without complications: Secondary | ICD-10-CM

## 2020-02-09 DIAGNOSIS — Z923 Personal history of irradiation: Secondary | ICD-10-CM | POA: Diagnosis not present

## 2020-02-09 DIAGNOSIS — Z170 Estrogen receptor positive status [ER+]: Secondary | ICD-10-CM

## 2020-02-09 DIAGNOSIS — Z853 Personal history of malignant neoplasm of breast: Secondary | ICD-10-CM | POA: Insufficient documentation

## 2020-02-09 DIAGNOSIS — I6523 Occlusion and stenosis of bilateral carotid arteries: Secondary | ICD-10-CM

## 2020-02-09 DIAGNOSIS — Z9221 Personal history of antineoplastic chemotherapy: Secondary | ICD-10-CM | POA: Diagnosis not present

## 2020-02-09 DIAGNOSIS — C50411 Malignant neoplasm of upper-outer quadrant of right female breast: Secondary | ICD-10-CM

## 2020-02-09 LAB — CBC WITH DIFFERENTIAL (CANCER CENTER ONLY)
Abs Immature Granulocytes: 0.03 10*3/uL (ref 0.00–0.07)
Basophils Absolute: 0 10*3/uL (ref 0.0–0.1)
Basophils Relative: 0 %
Eosinophils Absolute: 0 10*3/uL (ref 0.0–0.5)
Eosinophils Relative: 1 %
HCT: 40 % (ref 36.0–46.0)
Hemoglobin: 13.8 g/dL (ref 12.0–15.0)
Immature Granulocytes: 1 %
Lymphocytes Relative: 20 %
Lymphs Abs: 1.3 10*3/uL (ref 0.7–4.0)
MCH: 31.3 pg (ref 26.0–34.0)
MCHC: 34.5 g/dL (ref 30.0–36.0)
MCV: 90.7 fL (ref 80.0–100.0)
Monocytes Absolute: 0.6 10*3/uL (ref 0.1–1.0)
Monocytes Relative: 9 %
Neutro Abs: 4.4 10*3/uL (ref 1.7–7.7)
Neutrophils Relative %: 69 %
Platelet Count: 186 10*3/uL (ref 150–400)
RBC: 4.41 MIL/uL (ref 3.87–5.11)
RDW: 12.6 % (ref 11.5–15.5)
WBC Count: 6.4 10*3/uL (ref 4.0–10.5)
nRBC: 0 % (ref 0.0–0.2)

## 2020-02-09 LAB — CMP (CANCER CENTER ONLY)
ALT: 21 U/L (ref 0–44)
AST: 25 U/L (ref 15–41)
Albumin: 3.8 g/dL (ref 3.5–5.0)
Alkaline Phosphatase: 44 U/L (ref 38–126)
Anion gap: 8 (ref 5–15)
BUN: 16 mg/dL (ref 8–23)
CO2: 24 mmol/L (ref 22–32)
Calcium: 9.1 mg/dL (ref 8.9–10.3)
Chloride: 105 mmol/L (ref 98–111)
Creatinine: 0.74 mg/dL (ref 0.44–1.00)
GFR, Estimated: >60 mL/min (ref >60)
Glucose, Bld: 144 mg/dL — ABNORMAL HIGH (ref 70–99)
Potassium: 4.6 mmol/L (ref 3.5–5.1)
Sodium: 137 mmol/L (ref 135–145)
Total Bilirubin: 0.6 mg/dL (ref 0.3–1.2)
Total Protein: 6.8 g/dL (ref 6.5–8.1)

## 2020-02-11 ENCOUNTER — Telehealth: Payer: Self-pay | Admitting: Oncology

## 2020-02-11 NOTE — Telephone Encounter (Signed)
No 11/9 los, no changes made to pt schedule  

## 2020-04-18 ENCOUNTER — Other Ambulatory Visit: Payer: Medicare Other

## 2020-05-02 ENCOUNTER — Ambulatory Visit: Payer: Medicare Other | Admitting: Cardiology

## 2020-05-05 ENCOUNTER — Other Ambulatory Visit: Payer: Self-pay | Admitting: Oncology

## 2020-05-19 ENCOUNTER — Ambulatory Visit: Payer: Medicare Other

## 2020-05-19 ENCOUNTER — Other Ambulatory Visit: Payer: Self-pay

## 2020-05-19 ENCOUNTER — Other Ambulatory Visit: Payer: Medicare Other

## 2020-05-19 DIAGNOSIS — I350 Nonrheumatic aortic (valve) stenosis: Secondary | ICD-10-CM

## 2020-05-19 DIAGNOSIS — I6523 Occlusion and stenosis of bilateral carotid arteries: Secondary | ICD-10-CM

## 2020-05-22 ENCOUNTER — Other Ambulatory Visit: Payer: Self-pay | Admitting: Cardiology

## 2020-05-22 ENCOUNTER — Encounter: Payer: Self-pay | Admitting: Cardiology

## 2020-05-22 DIAGNOSIS — I6523 Occlusion and stenosis of bilateral carotid arteries: Secondary | ICD-10-CM

## 2020-05-23 NOTE — Progress Notes (Signed)
Patient understands echo results will be discussed during upcoming OV. She will be here for that appointment.

## 2020-05-26 ENCOUNTER — Other Ambulatory Visit: Payer: Self-pay

## 2020-05-26 ENCOUNTER — Encounter: Payer: Self-pay | Admitting: Cardiology

## 2020-05-26 ENCOUNTER — Ambulatory Visit: Payer: Medicare Other | Admitting: Cardiology

## 2020-05-26 VITALS — BP 151/77 | HR 67 | Temp 98.4°F | Resp 16 | Ht 61.0 in | Wt 170.6 lb

## 2020-05-26 DIAGNOSIS — I447 Left bundle-branch block, unspecified: Secondary | ICD-10-CM

## 2020-05-26 DIAGNOSIS — E6609 Other obesity due to excess calories: Secondary | ICD-10-CM

## 2020-05-26 DIAGNOSIS — Z87891 Personal history of nicotine dependence: Secondary | ICD-10-CM

## 2020-05-26 DIAGNOSIS — I6523 Occlusion and stenosis of bilateral carotid arteries: Secondary | ICD-10-CM

## 2020-05-26 DIAGNOSIS — Z853 Personal history of malignant neoplasm of breast: Secondary | ICD-10-CM

## 2020-05-26 DIAGNOSIS — I10 Essential (primary) hypertension: Secondary | ICD-10-CM

## 2020-05-26 DIAGNOSIS — E7800 Pure hypercholesterolemia, unspecified: Secondary | ICD-10-CM

## 2020-05-26 DIAGNOSIS — Z8679 Personal history of other diseases of the circulatory system: Secondary | ICD-10-CM

## 2020-05-26 DIAGNOSIS — I350 Nonrheumatic aortic (valve) stenosis: Secondary | ICD-10-CM

## 2020-05-26 MED ORDER — ROSUVASTATIN CALCIUM 40 MG PO TABS: 40.0000 mg | ORAL_TABLET | Freq: Every day | ORAL | 0 refills | Status: DC

## 2020-05-26 NOTE — Progress Notes (Signed)
Darlene Kelly Date of Birth: 08-02-1950 MRN: [id number] Primary Care Provider:Williams, Gracelyn Nurse, PA-C Former Cardiology Providers: Dr. Vear Clock, Jeri Lager, APRN, FNP-C Primary Cardiologist: Rex Kras, DO, [facility] (established care 10/12/2019)  Date: 05/26/20 Last Office Visit: 11/09/2019  Chief Complaint  Patient presents with  . Hypertension  . Moderate aortic stenosis  . Follow-up  . Results    Carotid duplex    HPI  Darlene Kelly is a 70 y.o.  female who presents to the office with a chief complaint of "46-monthfollow-up for reevaluation of aortic stenosis, carotid artery atherosclerosis and hypertension." Patient's past medical history and cardiovascular risk factors include: Mild to moderate valvular heart disease, hypertension, hyperlipidemia, history of right-sided breast cancer status post chemo/radiation/surgery, bilateral carotid artery atherosclerosis, former smoker, hx of rheumatic fever, postmenopausal female, advanced age.  Patient is being followed by our practice for the management of aortic stenosis, hypertension and carotid artery atherosclerosis.  Patient has a known history of aortic stenosis and she recently had a echocardiogram to reevaluate the severity.  Based on hemodynamics that she continues to have moderate aortic stenosis.  Clinically patient denies any angina pectoris, congestive heart failure symptoms, or syncope.  She is aware of the 3 cardinal signs of aortic stenosis and to seek medical attention if they surface.  She also has bilateral carotid artery atherosclerosis.  She had a repeat carotid duplex to evaluate the disease burden.  She continues to have bilateral carotid artery stenosis within the range of 50-69%.  No significant change compared to prior study.  She is currently on statin therapy.  With regards to her hypertension patient states that her home blood pressures are very well controlled.  Her office blood pressures are now  within acceptable range.  But she checks her home blood pressures on a regular basis and her systolic blood pressures are usually between 125-130 mmHg.  No hospitalizations or urgent care visits for cardiovascular symptoms.  Patient denies any angina factors or equivalents.  She continues to have her chronic dyspnea on exertion with exercise but it is overall stable and not worsening.  FUNCTIONAL STATUS: Walks 2-4 miles per day and has joined the [employer]   ALLERGIES: Allergies  Allergen Reactions  . Latex Itching, Dermatitis and Rash  . Lipitor [Atorvastatin] Other (See Comments)    Bad leg cramps   MEDICATION LIST PRIOR TO VISIT: Current Outpatient Medications on File Prior to Visit  Medication Sig Dispense Refill  . ASPIRIN 81 PO Take 1 tablet by mouth daily.    . Cholecalciferol (VITAMIN D3) 25 MCG (1000 UT) CAPS Take 1 capsule by mouth daily.    .Marland Kitchengabapentin (NEURONTIN) 300 MG capsule Take 1 capsule (300 mg total) by mouth 3 (three) times daily. Take a 300 mg capsule three times a day for two weeks Then a 300 mg capsule twice a day for two weeks Then a 300 mg capsule once a day for two weeks 84 capsule 0  . lisinopril (PRINIVIL,ZESTRIL) 10 MG tablet Take 10 mg by mouth at bedtime.   6  . metoprolol succinate (TOPROL-XL) 100 MG 24 hr tablet Take 100 mg by mouth every evening.     . Multiple Vitamins-Minerals (CENTRUM SILVER ADULT 50+ PO) Take 1 tablet by mouth daily.     No current facility-administered medications on file prior to visit.    PAST MEDICAL HISTORY: Past Medical History:  Diagnosis [place]  . [place]   . Breast cancer of upper-outer quadrant  of right female breast ([place]) 07/16/2014  . Diabetes mellitus without complication ([place])   . Heart murmur   . Heart murmur    from rhumatic fever as a child  . Hypertension   . Personal history of chemotherapy 2016  . Personal history of radiation therapy   . Skin cancer     PAST SURGICAL HISTORY: Past Surgical  History:  Procedure Laterality Date  . BREAST BIOPSY Right 07/13/2014   malignant  . BREAST LUMPECTOMY Right 02/11/2015   malignant  . PORT-A-CATH REMOVAL Left 02/11/2015   Procedure: REMOVAL PORT-A-CATH;  Surgeon: [name], MD;  Location: [facility];  Service: General;  Laterality: Left;  . PORTACATH PLACEMENT N/A 08/03/2014   Procedure: INSERTION PORT-A-CATH;  Surgeon: [name], MD;  Location: [facility];  Service: General;  Laterality: N/A;  . RADIOACTIVE SEED GUIDED PARTIAL MASTECTOMY WITH AXILLARY SENTINEL LYMPH NODE BIOPSY Right 02/11/2015   Procedure: RIGHT RADIOACTIVE SEED LOCALIZATION LUMPECTOMY  WITH  RIGHT AXILLARY SENTINEL LYMPH NODE BIOPSY;  Surgeon: [name], MD;  Location: [place];  Service: General;  Laterality: Right;  . TOTAL KNEE ARTHROPLASTY Left 01/19/2019   Procedure: TOTAL KNEE ARTHROPLASTY;  Surgeon: Gaynelle Arabian, MD;  Location: [facility];  Service: Orthopedics;  Laterality: Left;  16mn    FAMILY HISTORY: The patient's family history includes Heart disease in her brother, father, and mother; Lung cancer in her father.   SOCIAL HISTORY:  The patient  reports that she quit smoking about 34 years ago. Her smoking use included cigarettes. She has a 6.00 pack-year smoking history. She has never used smokeless tobacco. She reports current alcohol use of about 3.0 standard drinks of alcohol per week. She reports that she does not use drugs.  Review of Systems  Constitutional: Negative for chills and fever.  HENT: Negative for hoarse voice and nosebleeds.   Eyes: Negative for discharge, double vision and pain.  Cardiovascular: Negative for chest pain, claudication, dyspnea on exertion, leg swelling, near-syncope, orthopnea, palpitations, paroxysmal nocturnal dyspnea and syncope.  Respiratory: Negative for hemoptysis and shortness of breath.   Musculoskeletal: Negative for muscle cramps and myalgias.  Gastrointestinal: Negative for abdominal pain,  constipation, diarrhea, hematemesis, hematochezia, melena, nausea and vomiting.  Neurological: Negative for dizziness and light-headedness.   PHYSICAL EXAM: Vitals with BMI 05/26/2020 05/26/2020 02/09/2020  Height - 5' 1"  5' 1"   Weight - 170 lbs 10 oz 168 lbs 3 oz  BMI - 342.68334.1 Systolic [id number] Diastolic 77 87 72  Pulse 67 66 -    CONSTITUTIONAL: Well-developed and well-nourished. No acute distress.  SKIN: Skin is warm and dry. No rash noted. No cyanosis. No pallor. No jaundice HEAD: Normocephalic and atraumatic.  EYES: No scleral icterus MOUTH/THROAT: Moist oral membranes.  NECK: No JVD present. No thyromegaly noted. Bilateral carotid bruits  LYMPHATIC: No visible cervical adenopathy.  CHEST Normal respiratory effort. No intercostal retractions  LUNGS: Clear to auscultation bilaterally. No stridor. No wheezes. No rales.  CARDIOVASCULAR: Regular rate and rhythm, positive SX2-J1 soft holosystolic murmur heard at the apex, 3/6 systolic ejection murmur heard at the second right upper costal space, no gallops or rubs. ABDOMINAL: No apparent ascites.  EXTREMITIES: No peripheral edema  HEMATOLOGIC: No significant bruising NEUROLOGIC: Oriented to person, place, and time. Nonfocal. Normal muscle tone.  PSYCHIATRIC: Normal mood and affect. Normal behavior. Cooperative  CARDIAC DATABASE: EKG: 05/26/2020: Normal sinus rhythm, 67 bpm, left axis, left bundle branch block.  Echocardiogram: 10/07/2019:  LVEF 55 to 60%, moderate  LVH, indeterminate diastolic filling pattern, elevated left atrial pressure Thickened and calcified aortic valve leaflets with reduced cusp separation.  Moderate aortic stenosis (peak velocity 3.84ms, MG 254mG, AVA 0.9cm2, DI 0.3).   Mild aortic regurgitation.  Moderate calcification of the posterior mitral valve leaflet with normal excursion. No evidence of mitral stenosis. Mild (Grade I) mitral regurgitation.  Mild tricuspid regurgitation. Mild pulmonary  hypertension. RVSP measures 36 mmHg.  Compared to prior study dated 10/08/2018: No significant change except new pulmonary hypertension. Hemodynamics of AS has worsened but severity remains stable.    05/19/2020: Left ventricle cavity is normal in size. Mild concentric hypertrophy of the left ventricle. Normal global wall motion. Normal LV systolic function with EF 55%. Doppler evidence of grade I (impaired) diastolic dysfunction, normal LAP. Left atrial cavity is moderately dilated. Trileaflet aortic valve with moderate annular and leaflet calcification. Moderate calcification of the aortic valve annulus. Vmax 3.2 m/sec, mean PG 26 mmHg, AVA 0.8 cm2 by continuity equation. DVI of 0.3 suggests moderate aortic stenosis. Mild (Grade I) aortic regurgitation. Posterior leaflet moderate calcification without significant stenosis. Mild to moderate mitral regurgitation. Mild tricuspid regurgitation. No evidence of pulmonary hypertension. PASP was noted 36 mmHg on 04/09/2019. Compared to then, no significant change in aortic stenosis severity.  Stress Testing:  Lexiscan Myoview Stress Test 01/12/2019: Stress EKG is non-diagnostic, as this is a pharmacological stress test. In addition, Rest and stress EKG show sinus rhythm, LBBB.  SPECT images reveal basal to apical inferolateral, basal inferior, basal inferoseptal, seen both on stress and rest images. While tissue attenuation is present, ischemia in this region cannot be completely excluded.  Stress LVEF is 73%. Low risk study. No prior studies for comparison.  Heart Catheterization: None  Carotid artery duplex: 10/15/2019:  Stenosis in the right internal carotid artery (50-69%). Stenosis in the right external carotid artery (<50%).  Senosis in the left internal carotid artery (50-69%).  Antegrade right vertebral artery flow. Antegrade left vertebral artery flow.  Follow up in six months is appropriate if clinically indicated.  05/23/2020: Stenosis  in the right internal carotid artery (50-69%). Stenosis in the right external carotid artery (<50%). Stenosis in the left internal carotid artery (50-69%). Stenosis in the left external carotid artery (<50%). Antegrade right vertebral artery flow. Antegrade left vertebral artery flow. No significant change from 10/15/2019. Follow up in six months is appropriate if clinically indicated.    LABORATORY DATA: CBC Latest Ref Rng & Units 02/09/2020 02/02/2019 01/21/2019  WBC 4.0 - 10.5 K/uL 6.4 6.2 11.7(H)  Hemoglobin 12.0 - 15.0 g/dL 13.8 10.7(L) 9.5(L)  Hematocrit 36.0 - 46.0 % 40.0 32.4(L) 28.3(L)  Platelets 150 - 400 K/uL 186 274 132(L)    CMP Latest Ref Rng & Units 02/09/2020 02/02/2019 01/21/2019  Glucose 70 - 99 mg/dL 144(H) 142(H) 145(H)  BUN 8 - 23 mg/dL 16 13 10   Creatinine 0.44 - 1.00 mg/dL 0.74 0.82 0.54  Sodium 135 - 145 mmol/L 137 139 124(L)  Potassium 3.5 - 5.1 mmol/L 4.6 4.8 3.6  Chloride 98 - 111 mmol/L 105 105 99  CO2 22 - 32 mmol/L 24 24 22   Calcium 8.9 - 10.3 mg/dL 9.1 9.6 8.2(L)  Total Protein 6.5 - 8.1 g/dL 6.8 6.6 -  Total Bilirubin 0.3 - 1.2 mg/dL 0.6 0.5 -  Alkaline Phos 38 - 126 U/L 44 47 -  AST 15 - 41 U/L 25 26 -  ALT 0 - 44 U/L 21 38 -    Lipid Panel  03/11/2018- cholesterol-127, LDL-78,  HDL-43, triglycerides-80  External Labs: Collected: 03/04/2020 provided by the patient Creatinine 0.7 mg/dL. eGFR: 89 mL/min per 1.73 m Lipid profile: Total cholesterol 158, triglycerides 135, HDL 51, LDL 93, non-HDL 107 Hemoglobin A1c: 5.9 AST 24, ALT 19, alkaline phosphatase 41 (all within normal limits)  Cardiac Panel (last 3 results) No results for input(s): CKTOTAL, CKMB, TROPONINIHS, RELINDX in the last 72 hours.  IMPRESSION:    ICD-10-CM   1. Moderate aortic stenosis  I35.0 EKG 12-Lead    PCV ECHOCARDIOGRAM COMPLETE  2. Asymptomatic bilateral carotid artery stenosis  I65.23 PCV CAROTID DUPLEX (BILATERAL)    rosuvastatin (CRESTOR) 40 MG tablet  3. Hx of  rheumatic fever  Z86.79   4. Essential hypertension  I10   5. LBBB (left bundle branch block)  I44.7   6. Hypercholesterolemia  E78.00 rosuvastatin (CRESTOR) 40 MG tablet    CMP14+EGFR    CMP14+EGFR  7. Former smoker  Z87.891   73. Class 1 obesity due to excess calories with serious comorbidity and body mass index (BMI) of 32.0 to 32.9 in adult  E66.09    Z68.32   9. Hx of breast cancer  Z85.3      RECOMMENDATIONS: JACKILYN UMPHLETT is a 70 y.o. 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  Moderate aortic stenosis with history of rheumatic fever:  Patient remains asymptomatic.  Most recent echocardiogram results reviewed with the patient and noted above for further reference.  Patient is aware of the 3 cardinal signs of aortic stenosis and if present should seek medical attention sooner than her scheduled appointment.  Since the severity of aortic stenosis has remained stable we will follow up an echocardiogram in 1 year.  Bilateral carotid artery atherosclerosis:  The severity of bilateral carotid stenosis remains stable compared to prior study.  Patient is already on aspirin 81 mg p.o. daily and Crestor 20 mg p.o. nightly.  Given the fact that the severity of carotid stenosis has not improved since last evaluation the shared decision was to increase her Crestor to 40 mg p.o. nightly.  Recommend checking a fasting lipid profile and CMP in 6 weeks to evaluate lipids and liver function respectively.  Repeat carotid duplex in 6 months.  Benign essential hypertension: . Back in July 2021 patient requested that we help manage her hypertension. . Office blood pressure is not at goal.  . Home blood  pressures are better controlled. . Patient will be reenrolled into ambulatory blood pressure monitoring for closer follow-up. . Low salt diet recommended. A diet that is rich in fruits, vegetables, legumes, and low-fat dairy products and low in snacks, sweets, and meats (such as the Dietary Approaches to Stop Hypertension [DASH] diet).   LBBB: Continue to monitor.   Obesity, due to excess calories: Body mass index is 32.23 kg/m. . I reviewed with the patient the importance of diet, regular physical activity/exercise, weight loss.   . Patient is educated on increasing physical activity gradually as tolerated.  With the goal of moderate intensity exercise for 30 minutes a day 5 days a week.  FINAL MEDICATION LIST END OF ENCOUNTER: Meds ordered this encounter  Medications  . rosuvastatin (CRESTOR) 40 MG tablet    Sig: Take 1 tablet (40 mg total) by mouth at bedtime.    Dispense:  90 tablet  Refill:  0    Medications Discontinued During This Encounter  Medication Reason  . rosuvastatin (CRESTOR) 20 MG tablet Reorder    Current Outpatient Medications:  .  ASPIRIN 81 PO, Take 1 tablet by mouth daily., Disp: , Rfl:  .  Cholecalciferol (VITAMIN D3) 25 MCG (1000 UT) CAPS, Take 1 capsule by mouth daily., Disp: , Rfl:  .  gabapentin (NEURONTIN) 300 MG capsule, Take 1 capsule (300 mg total) by mouth 3 (three) times daily. Take a 300 mg capsule three times a day for two weeks Then a 300 mg capsule twice a day for two weeks Then a 300 mg capsule once a day for two weeks, Disp: 84 capsule, Rfl: 0 .  lisinopril (PRINIVIL,ZESTRIL) 10 MG tablet, Take 10 mg by mouth at bedtime. , Disp: , Rfl: 6 .  metoprolol succinate (TOPROL-XL) 100 MG 24 hr tablet, Take 100 mg by mouth every evening. , Disp: , Rfl:  .  Multiple Vitamins-Minerals (CENTRUM SILVER ADULT 50+ PO), Take 1 tablet by mouth daily., Disp: , Rfl:  .  rosuvastatin (CRESTOR) 40 MG tablet, Take 1 tablet (40 mg total) by mouth at bedtime., Disp:  90 tablet, Rfl: 0  Orders Placed This Encounter  Procedures  . CMP14+EGFR  . EKG 12-Lead  . PCV ECHOCARDIOGRAM COMPLETE  . PCV CAROTID DUPLEX (BILATERAL)   --Continue cardiac medications as reconciled in final medication list. --Return in about 6 months (around 11/23/2020) for Follow up carotid disease. Or sooner if needed. --Continue follow-up with your primary care physician regarding the management of your other chronic comorbid conditions.  Patient's questions and concerns were addressed to her satisfaction. She voices understanding of the instructions provided during this encounter.   This note was created using a voice recognition software as a result there may be grammatical errors inadvertently enclosed that do not reflect the nature of this encounter. Every attempt is made to correct such errors.  Rex Kras, [place], [facility]  Pager: [phone] Office: [phone]

## 2020-06-04 NOTE — Telephone Encounter (Signed)
Error

## 2020-06-09 ENCOUNTER — Other Ambulatory Visit: Payer: Medicare Other

## 2020-06-13 NOTE — Progress Notes (Signed)
No change in carotid disease, moderate 50-60% bilateral. Recheck in 6 months

## 2020-06-13 NOTE — Progress Notes (Signed)
Called patient, NA, LMAM

## 2020-06-14 NOTE — Progress Notes (Signed)
Called and spoke with patient regarding her CAD results.

## 2020-06-20 ENCOUNTER — Encounter: Payer: Self-pay | Admitting: Pharmacist

## 2020-06-20 NOTE — Progress Notes (Signed)
CARE PLAN ENTRY  06/20/2020 Name: Darlene Kelly MRN: [id number] DOB: 24-Jan-1951  Darlene Kelly is enrolled in Remote Patient Monitoring/Principle Care Monitoring.  Date of Enrollment: 05/26/20 Supervising physician: Rex Kras Indication: HTN  Remote Readings: Compliant and Avg BP: 142/82, HR:74  Next scheduled OV: 11/23/20  Pharmacist Clinical Goal(s):  Marland Kitchen Over the next 90 days, patient will demonstrate Improved medication adherence as evidenced by medication fill history . Over the next 90 days, patient will demonstrate improved understanding of prescribed medications and rationale for usage as evidenced by patient teach back . Over the next 90 days, patient will experience decrease in ED visits. ED visits in last 6 months = 0 . Over the next 90 days, patient will not experience hospital admission. Hospital Admissions in last 6 months = 0  Interventions: . Provider and Inter-disciplinary care team collaboration (see longitudinal plan of care) . Comprehensive medication review performed. . Discussed plans with patient for ongoing care management follow up and provided patient with direct contact information for care management team . Collaboration with provider re: medication management  Patient Self Care Activities:  . Self administers medications as prescribed . Attends all scheduled provider appointments . Performs ADL's independently . Performs IADL's independently  Patient Active Problem List   Diagnosis Date Noted  . OA (osteoarthritis) of knee 01/19/2019  . Mild aortic stenosis 10/07/2018  . Osteopenia 02/04/2018  . Diabetes mellitus type II, non insulin dependent ([place]) 07/21/2014  . Essential hypertension 07/21/2014  . Cardiac murmur 07/21/2014  . Hypercholesterolemia 07/21/2014  . Malignant neoplasm of upper-outer quadrant of right breast in female, estrogen receptor positive ([place]) 07/16/2014   Past Surgical History:  Procedure Laterality Date  . BREAST  BIOPSY Right 07/13/2014   malignant  . BREAST LUMPECTOMY Right 02/11/2015   malignant  . PORT-A-CATH REMOVAL Left 02/11/2015   Procedure: REMOVAL PORT-A-CATH;  Surgeon: [name], MD;  Location: [place];  Service: General;  Laterality: Left;  . PORTACATH PLACEMENT N/A 08/03/2014   Procedure: INSERTION PORT-A-CATH;  Surgeon: [name], MD;  Location: [facility];  Service: General;  Laterality: N/A;  . RADIOACTIVE SEED GUIDED PARTIAL MASTECTOMY WITH AXILLARY SENTINEL LYMPH NODE BIOPSY Right 02/11/2015   Procedure: RIGHT RADIOACTIVE SEED LOCALIZATION LUMPECTOMY  WITH  RIGHT AXILLARY SENTINEL LYMPH NODE BIOPSY;  Surgeon: [name], MD;  Location: [place];  Service: General;  Laterality: Right;  . TOTAL KNEE ARTHROPLASTY Left 01/19/2019   Procedure: TOTAL KNEE ARTHROPLASTY;  Surgeon: Gaynelle Arabian, MD;  Location: [facility];  Service: Orthopedics;  Laterality: Left;  41min   Social History   Socioeconomic History  . Marital status: Married    Spouse name: Not on file  . Number of children: 3  . Years of education: Not on file  . Highest education level: Not on file  Occupational History  . Not on file  Tobacco Use  . Smoking status: Former Smoker    Packs/day: 0.50    Years: 12.00    Pack years: 6.00    Types: Cigarettes    Quit date: 04/02/1986    Years since quitting: 34.2  . Smokeless tobacco: Never Used  Vaping Use  . Vaping Use: Never used  Substance and Sexual Activity  . Alcohol use: Yes    Alcohol/week: 3.0 standard drinks    Types: 3 Glasses of wine per week    Comment: occ  . Drug use: No  . Sexual activity: Not on file  Other Topics Concern  . Not on  file  Social History Narrative  . Not on file   Social Determinants of Health   Financial Resource Strain: Not on file  Food Insecurity: Not on file  Transportation Needs: Not on file  Physical Activity: Not on file  Stress: Not on file  Social Connections: Not on file   Family History  Problem  Relation Age of Onset  . Heart disease Mother   . Lung cancer Father   . Heart disease Father   . Heart disease Brother    Allergies  Allergen Reactions  . Latex Itching, Dermatitis and Rash  . Lipitor [Atorvastatin] Other (See Comments)    Bad leg cramps   Outpatient Encounter Medications as of 06/20/2020  Medication Sig  . ASPIRIN 81 PO Take 1 tablet by mouth daily.  . Cholecalciferol (VITAMIN D3) 25 MCG (1000 UT) CAPS Take 1 capsule by mouth daily.  Marland Kitchen gabapentin (NEURONTIN) 300 MG capsule Take 1 capsule (300 mg total) by mouth 3 (three) times daily. Take a 300 mg capsule three times a day for two weeks Then a 300 mg capsule twice a day for two weeks Then a 300 mg capsule once a day for two weeks  . lisinopril (PRINIVIL,ZESTRIL) 10 MG tablet Take 10 mg by mouth at bedtime.   . metoprolol succinate (TOPROL-XL) 100 MG 24 hr tablet Take 100 mg by mouth every evening.   . Multiple Vitamins-Minerals (CENTRUM SILVER ADULT 50+ PO) Take 1 tablet by mouth daily.  . rosuvastatin (CRESTOR) 40 MG tablet Take 1 tablet (40 mg total) by mouth at bedtime.   No facility-administered encounter medications on file as of 06/20/2020.   Patient Care Team    Relationship Specialty Notifications Start End  Heywood Bene, [place] PCP - General Physician Assistant  01/06/19   [name], [name], MD Consulting Physician Oncology  07/16/14   [name], MD Consulting Physician Radiation Oncology  07/16/14   [name], RN Registered Nurse  Admissions 07/16/14   [name], RN Registered Nurse  Admissions 07/16/14   [name], MD Referring Physician Cardiology  07/21/14   [name], MD Referring Physician Dermatology  07/21/14   Key, Nelia Shi, NP Nurse Practitioner Gynecology  02/09/20       Hypertension   BP goal is:  <130/80  Office blood pressures are  BP Readings from Last 3 Encounters:  05/26/20 (!) 151/77  02/09/20 (!) 154/72  11/09/19 (!) 159/72    Patient  is currently controlled on the following medications: lisinopril 10 mg, metoprolol 100 mg,   Patient checks BP at home daily  Patient home BP readings are ranging: 145/82  Patient has failed these meds in the past: N/A  We discussed diet and exercise extensively  Plan  Continue current medications and control with diet and exercise  ______________ Visit Information SDOH (Social Determinants of Health) assessments performed: Yes.  Ms. Gainey was given information about Principle Care Management/Remote Patient Monitoring services today including:  1. RPM/PCM service includes personalized support from designated clinical staff supervised by her physician, including individualized plan of care and coordination with other care providers 2. 24/7 contact phone numbers for assistance for urgent and routine care needs. 3. Standard insurance, coinsurance, copays and deductibles apply for principle care management only during months in which we provide at least 30 minutes of these services. Most insurances cover these services at 100%, however patients may be responsible for any copay, coinsurance and/or deductible if applicable. This service may help  you avoid the need for more expensive face-to-face services. 4. Only one practitioner may furnish and bill the service in a calendar month. 5. The patient may stop PCM/RPM services at any time (effective at the end of the month) by phone call to the office staff.  Patient agreed to services and verbal consent obtained.   Manuela Schwartz, Pharm.D. [facility] [phone]

## 2020-07-08 ENCOUNTER — Other Ambulatory Visit: Payer: Self-pay | Admitting: Oncology

## 2020-07-08 DIAGNOSIS — Z1231 Encounter for screening mammogram for malignant neoplasm of breast: Secondary | ICD-10-CM

## 2020-07-08 LAB — CMP14+EGFR
ALT: 20 IU/L (ref 0–32)
AST: 24 IU/L (ref 0–40)
Albumin/Globulin Ratio: 2 (ref 1.2–2.2)
Albumin: 4.5 g/dL (ref 3.8–4.8)
Alkaline Phosphatase: 58 IU/L (ref 44–121)
BUN/Creatinine Ratio: 15 (ref 12–28)
BUN: 16 mg/dL (ref 8–27)
Bilirubin Total: 0.3 mg/dL (ref 0.0–1.2)
CO2: 20 mmol/L (ref 20–29)
Calcium: 10.2 mg/dL (ref 8.7–10.3)
Chloride: 101 mmol/L (ref 96–106)
Creatinine, Ser: 1.09 mg/dL — ABNORMAL HIGH (ref 0.57–1.00)
Globulin, Total: 2.3 g/dL (ref 1.5–4.5)
Glucose: 162 mg/dL — ABNORMAL HIGH (ref 65–99)
Potassium: 5.1 mmol/L (ref 3.5–5.2)
Sodium: 141 mmol/L (ref 134–144)
Total Protein: 6.8 g/dL (ref 6.0–8.5)
eGFR: 55 mL/min/{1.73_m2} — ABNORMAL LOW (ref >59)

## 2020-07-11 ENCOUNTER — Other Ambulatory Visit: Payer: Self-pay | Admitting: Pharmacist

## 2020-07-11 DIAGNOSIS — I350 Nonrheumatic aortic (valve) stenosis: Secondary | ICD-10-CM

## 2020-07-11 DIAGNOSIS — I6523 Occlusion and stenosis of bilateral carotid arteries: Secondary | ICD-10-CM

## 2020-07-11 DIAGNOSIS — I10 Essential (primary) hypertension: Secondary | ICD-10-CM

## 2020-07-11 NOTE — Progress Notes (Signed)
Sounds like a plan.  Please place and release a BMP in 2 weeks.

## 2020-07-11 NOTE — Progress Notes (Signed)
Called and reviewed lab results with pt. Pt continues to take lisinopril 10 mg, metoprolol 100 mg daily as directed. Denies any recent medication changes. Pt denies any recent NSAIDs use. Pt reports that she continues to  hydrate adequately. Pt wiling to increase her oral fluid intake and rechecking in 1-2 weeks if appropriate.

## 2020-07-25 ENCOUNTER — Other Ambulatory Visit: Payer: Self-pay

## 2020-07-25 DIAGNOSIS — I10 Essential (primary) hypertension: Secondary | ICD-10-CM

## 2020-07-26 LAB — BASIC METABOLIC PANEL
BUN/Creatinine Ratio: 16 (ref 12–28)
BUN: 15 mg/dL (ref 8–27)
CO2: 23 mmol/L (ref 20–29)
Calcium: 9.8 mg/dL (ref 8.7–10.3)
Chloride: 100 mmol/L (ref 96–106)
Creatinine, Ser: 0.94 mg/dL (ref 0.57–1.00)
Glucose: 147 mg/dL — ABNORMAL HIGH (ref 65–99)
Potassium: 4.9 mmol/L (ref 3.5–5.2)
Sodium: 136 mmol/L (ref 134–144)
eGFR: 66 mL/min/{1.73_m2} (ref >59)

## 2020-07-26 NOTE — Progress Notes (Signed)
Lab results reviewed with pt. Stable. Improved renal function following increased hydration. Pt agreeable to continue current therapy

## 2020-08-30 ENCOUNTER — Ambulatory Visit
Admission: RE | Admit: 2020-08-30 | Discharge: 2020-08-30 | Disposition: A | Discharge status: Home / Self Care | Payer: Medicare Other | Source: Ambulatory Visit | Attending: Oncology | Admitting: Oncology

## 2020-08-30 ENCOUNTER — Other Ambulatory Visit: Payer: Self-pay

## 2020-08-30 DIAGNOSIS — Z1231 Encounter for screening mammogram for malignant neoplasm of breast: Secondary | ICD-10-CM

## 2020-11-02 ENCOUNTER — Other Ambulatory Visit: Payer: Self-pay

## 2020-11-02 ENCOUNTER — Ambulatory Visit: Payer: Medicare Other

## 2020-11-11 NOTE — Progress Notes (Signed)
Patient is aware of her results.

## 2020-11-14 ENCOUNTER — Other Ambulatory Visit: Payer: Medicare Other

## 2020-11-23 ENCOUNTER — Ambulatory Visit: Payer: Medicare Other | Admitting: Cardiology

## 2020-11-23 ENCOUNTER — Encounter: Payer: Self-pay | Admitting: Cardiology

## 2020-11-23 ENCOUNTER — Other Ambulatory Visit: Payer: Self-pay

## 2020-11-23 VITALS — BP 164/87 | HR 71 | Temp 98.1°F | Resp 16 | Ht 61.0 in | Wt 175.4 lb

## 2020-11-23 DIAGNOSIS — I10 Essential (primary) hypertension: Secondary | ICD-10-CM

## 2020-11-23 DIAGNOSIS — I350 Nonrheumatic aortic (valve) stenosis: Secondary | ICD-10-CM

## 2020-11-23 DIAGNOSIS — I6523 Occlusion and stenosis of bilateral carotid arteries: Secondary | ICD-10-CM

## 2020-11-23 DIAGNOSIS — I447 Left bundle-branch block, unspecified: Secondary | ICD-10-CM

## 2020-11-23 DIAGNOSIS — Z8679 Personal history of other diseases of the circulatory system: Secondary | ICD-10-CM

## 2020-11-23 DIAGNOSIS — Z87891 Personal history of nicotine dependence: Secondary | ICD-10-CM

## 2020-11-23 DIAGNOSIS — E7800 Pure hypercholesterolemia, unspecified: Secondary | ICD-10-CM

## 2020-11-23 DIAGNOSIS — E6609 Other obesity due to excess calories: Secondary | ICD-10-CM

## 2020-11-23 DIAGNOSIS — Z853 Personal history of malignant neoplasm of breast: Secondary | ICD-10-CM

## 2020-11-23 MED ORDER — EZETIMIBE 10 MG PO TABS: 10.0000 mg | ORAL_TABLET | Freq: Every day | ORAL | 0 refills | Status: DC

## 2020-11-23 MED ORDER — ROSUVASTATIN CALCIUM 40 MG PO TABS: 20.0000 mg | ORAL_TABLET | Freq: Every day | ORAL | Status: DC

## 2020-11-23 NOTE — Progress Notes (Signed)
Darlene Kelly Date of Birth: [birth year] MRN: [id number] Primary Care Provider:Williams, Gracelyn Nurse, PA-C Former Cardiology Providers: Dr. Vear Clock, Jeri Lager, APRN, FNP-C Primary Cardiologist: Rex Kras, DO, [facility] (established care 10/12/2019)  Date: 11/23/20 Last Office Visit: 05/26/2020   Chief Complaint  Patient presents with    carotid disease   Follow-up    HPI  Darlene Kelly is a 70 y.o.  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  Patient is here for 678-monthollow-up for management of her underlying aortic stenosis, carotid artery atherosclerosis, and hypertension.  Since last office visit she had an echocardiogram in August 2022 which notes preserved LVEF with moderate aortic stenosis and moderate mitral regurgitation.  Clinically she does not have any chest pain or angina pectoris, heart failure symptoms, near-syncope or syncopal events.  Patient is also noted to have bilateral carotid artery atherosclerosis which was found incidentally based on physical examination findings.  She is been following up with carotid duplexes every 6 months to reevaluate the progression of carotid disease.  The most recent carotid duplex notes stable bilateral carotid artery stenosis in the range of 50-69%.  At the last office visit recommended her to increase Crestor to 40 mg p.o. nightly.  Patient took the higher dose of Crestor for approximately 2 to 3 weeks and started having muscle aches and cramps and subsequently went down to 20 mg p.o. nightly and tolerating the  medication well.  With regards to blood pressure management she is enrolled into principal care management blood pressure log reviewed.  Over the last 2 to 3 weeks her average blood pressure is 123/68 with a pulse of 59 bpm.  Clinically patient is doing well from a cardiovascular standpoint she denies any chest pain or shortness of breath, no urgent care or hospitalizations for cardiovascular symptoms, and she continues to have excellent functional capacity for age as she continues to walk 2 to 4 miles per day and [facility] [employer] FUNCTIONAL STATUS: Walks 2-4 miles per day and has joined the [employer]  ALLERGIES: Allergies  Allergen Reactions   Latex Itching, Dermatitis and Rash   Lipitor [Atorvastatin] Other (See Comments)    Bad leg cramps   MEDICATION LIST PRIOR TO VISIT: Current Outpatient Medications on File Prior to Visit  Medication Sig Dispense Refill   ASPIRIN 81 PO Take 1 tablet by mouth daily.     Cholecalciferol (VITAMIN D3) 25 MCG (1000 UT) CAPS Take 1 capsule by mouth daily.     gabapentin (NEURONTIN) 300 MG capsule Take 1 capsule (300 mg total) by mouth 3 (three) times daily. Take a 300 mg capsule three times a day for two weeks Then a 300 mg capsule twice a day for two weeks Then a 300 mg capsule once a day for two weeks 84 capsule 0   lisinopril (PRINIVIL,ZESTRIL) 10 MG tablet Take 10 mg by mouth at bedtime.   6   metoprolol succinate (TOPROL-XL) 100 MG 24 hr tablet Take 100 mg by mouth every evening.      Multiple Vitamins-Minerals (CENTRUM SILVER ADULT 50+ PO) Take 1 tablet by mouth daily.     No current facility-administered medications on  file prior to visit.    PAST MEDICAL HISTORY: Past Medical History:  Diagnosis Date   Breast cancer ([place])    Breast cancer of upper-outer quadrant of right female breast ([place]) 07/16/2014   Diabetes mellitus without complication ([facility])    Heart murmur    Heart murmur    from rhumatic fever as a child   Hypertension    Personal  history of chemotherapy 2016   Personal history of radiation therapy    Skin cancer     PAST SURGICAL HISTORY: Past Surgical History:  Procedure Laterality Date   BREAST BIOPSY Right 07/13/2014   malignant   BREAST LUMPECTOMY Right 02/11/2015   malignant   PORT-A-CATH REMOVAL Left 02/11/2015   Procedure: REMOVAL PORT-A-CATH;  Surgeon: [name], MD;  Location: [place];  Service: General;  Laterality: Left;   PORTACATH PLACEMENT N/A 08/03/2014   Procedure: INSERTION PORT-A-CATH;  Surgeon: [name], MD;  [facility];  Service: General;  Laterality: N/A;   RADIOACTIVE SEED GUIDED PARTIAL MASTECTOMY WITH AXILLARY SENTINEL LYMPH NODE BIOPSY Right 02/11/2015   Procedure: RIGHT RADIOACTIVE SEED LOCALIZATION LUMPECTOMY  WITH  RIGHT AXILLARY SENTINEL LYMPH NODE BIOPSY;  Surgeon: [name], MD;  Location: [place];  Service: General;  Laterality: Right;   TOTAL KNEE ARTHROPLASTY Left 01/19/2019   Procedure: TOTAL KNEE ARTHROPLASTY;  Surgeon: Gaynelle Arabian, MD;  [facility];  Service: Orthopedics;  Laterality: Left;  50mn    FAMILY HISTORY: The patient's family history includes Heart disease in her brother, father, and mother; Lung cancer in her father.   SOCIAL HISTORY:  The patient  reports that she quit smoking about 34 years ago. Her smoking use included cigarettes. She has a 6.00 pack-year smoking history. She has never used smokeless tobacco. She reports current alcohol use of about 3.0 standard drinks per week. She reports that she does not use drugs.  Review of Systems  Constitutional: Negative for chills and fever.  HENT:  Negative for hoarse voice and nosebleeds.   Eyes:  Negative for discharge, double vision and pain.  Cardiovascular:  Negative for chest pain, claudication, dyspnea on exertion, leg swelling, near-syncope, orthopnea, palpitations, paroxysmal nocturnal dyspnea and syncope.  Respiratory:  Negative for hemoptysis and shortness of breath.    Musculoskeletal:  Negative for muscle cramps and myalgias.  Gastrointestinal:  Negative for abdominal pain, constipation, diarrhea, hematemesis, hematochezia, melena, nausea and vomiting.  Neurological:  Negative for dizziness and light-headedness.   PHYSICAL EXAM: Vitals with BMI 11/23/2020 05/26/2020 05/26/2020  Height _0  - _1   Weight 175 lbs 6 oz - 170 lbs 10 oz  BMI 308.67- 361.95 Systolic [id number] Diastolic 87 77 87  Pulse 71 67 66    CONSTITUTIONAL: Well-developed and well-nourished. No acute distress.  SKIN: Skin is warm and dry. No rash noted. No cyanosis. No pallor. No jaundice HEAD: Normocephalic and atraumatic.  EYES: No scleral icterus MOUTH/THROAT: Moist oral membranes.  NECK: No JVD present. No thyromegaly noted. Bilateral carotid bruits  LYMPHATIC: No visible cervical adenopathy.  CHEST Normal respiratory effort. No intercostal retractions  LUNGS: Clear to auscultation bilaterally. No stridor. No wheezes. No rales.  CARDIOVASCULAR: Regular rate and rhythm, positive SP8-K9 soft holosystolic murmur heard at the apex, 3/6 systolic ejection murmur heard at the second right upper costal space, no gallops or rubs. ABDOMINAL: Soft, nontender, nondistended, positive bowel sounds in all 4 quadrants, no apparent ascites.  EXTREMITIES: No peripheral edema, warm to touch, 2+ DP and  PT pulses. HEMATOLOGIC: No significant bruising NEUROLOGIC: Oriented to person, place, and time. Nonfocal. Normal muscle tone.  PSYCHIATRIC: Normal mood and affect. Normal behavior. Cooperative  CARDIAC DATABASE: EKG: 05/26/2020: Normal sinus rhythm, 67 bpm, left axis, left bundle branch block.  Echocardiogram: 10/07/2019: LVEF 55 to 60%, moderate LVH, indeterminate diastolic filling pattern, elevated LAP. Moderate aortic stenosis (peak velocity 3.29ms, MG 238mG, AVA 0.9cm2, DI 0.3).     05/19/2020: LVEF 55%, mild LVH, normal wall motion, grade 1 diastolic impairment, normal LAP,  moderately dilated left atrium, posterior MAC, mild to moderate MR, mild TR, mild aortic regurgitation, moderate aortic stenosis (Vmax 3.2 m/sec, mean PG 26 mmHg, AVA 0.8 cm2 by continuity equation. DVI of 0.3).   Stress Testing:  Lexiscan Myoview Stress Test 01/12/2019: Stress EKG is non-diagnostic, as this is a pharmacological stress test. In addition, Rest and stress EKG show sinus rhythm, LBBB.  SPECT images reveal basal to apical inferolateral, basal inferior, basal inferoseptal, seen both on stress and rest images. While tissue attenuation is present, ischemia in this region cannot be completely excluded.  Stress LVEF is 73%. Low risk study. No prior studies for comparison.  Heart Catheterization: None  Carotid artery duplex: 10/15/2019:  Stenosis in the right internal carotid artery (50-69%). Stenosis in the right external carotid artery (<50%).  Senosis in the left internal carotid artery (50-69%).  Antegrade right vertebral artery flow. Antegrade left vertebral artery flow.  Follow up in six months is appropriate if clinically indicated.  05/23/2020: Stenosis in the right internal carotid artery (50-69%). Stenosis in the right external carotid artery (<50%). Stenosis in the left internal carotid artery (50-69%). Stenosis in the left external carotid artery (<50%). Antegrade right vertebral artery flow. Antegrade left vertebral artery flow. No significant change from 10/15/2019. Follow up in six months is appropriate if clinically indicated.    LABORATORY DATA: CBC Latest Ref Rng & Units 02/09/2020 02/02/2019 01/21/2019  WBC 4.0 - 10.5 K/uL 6.4 6.2 11.7(H)  Hemoglobin 12.0 - 15.0 g/dL 13.8 10.7(L) 9.5(L)  Hematocrit 36.0 - 46.0 % 40.0 32.4(L) 28.3(L)  Platelets 150 - 400 K/uL 186 274 132(L)    CMP Latest Ref Rng & Units 07/25/2020 07/07/2020 02/09/2020  Glucose 65 - 99 mg/dL 147(H) 162(H) 144(H)  BUN 8 - 27 mg/dL _0 Creatinine 0.57 - 1.00 mg/dL 0.94 1.09(H) 0.74  Sodium  134 - 144 mmol/L 136 141 137  Potassium 3.5 - 5.2 mmol/L 4.9 5.1 4.6  Chloride 96 - 106 mmol/L 100 101 105  CO2 20 - 29 mmol/L _1 Calcium 8.7 - 10.3 mg/dL 9.8 10.2 9.1  Total Protein 6.0 - 8.5 g/dL - 6.8 6.8  Total Bilirubin 0.0 - 1.2 mg/dL - 0.3 0.6  Alkaline Phos 44 - 121 IU/L - 58 44  AST 0 - 40 IU/L - 24 25  ALT 0 - 32 IU/L - 20 21    Lipid Panel  03/11/2018- cholesterol-127, LDL-78, HDL-43, triglycerides-80  External Labs: Collected: 03/04/2020 provided by the patient Creatinine 0.7 mg/dL. eGFR: 89 mL/min per 1.73 m Lipid profile: Total cholesterol 158, triglycerides 135, HDL 51, LDL 93, non-HDL 107 Hemoglobin A1c: 5.9 AST 24, ALT 19, alkaline phosphatase 41 (all within normal limits)  Cardiac Panel (last 3 results) No results for input(s): CKTOTAL, CKMB, TROPONINIHS, RELINDX in the last 72 hours.  IMPRESSION:    ICD-10-CM   1. Moderate aortic stenosis  I35.0 PCV ECHOCARDIOGRAM COMPLETE    2. Asymptomatic bilateral carotid artery stenosis  I65.23  EKG 12-Lead    rosuvastatin (CRESTOR) 40 MG tablet    ezetimibe (ZETIA) 10 MG tablet    PCV CAROTID DUPLEX (BILATERAL)    3. Hx of rheumatic fever  Z86.79     4. Essential hypertension  I10     5. LBBB (left bundle branch block)  I44.7     6. Hypercholesterolemia  E78.00 rosuvastatin (CRESTOR) 40 MG tablet    ezetimibe (ZETIA) 10 MG tablet    7. Former smoker  Z87.891     68. Class 1 obesity due to excess calories with serious comorbidity and body mass index (BMI) of 32.0 to 32.9 in adult  E66.09    Z68.32     9. Hx of breast cancer  Z85.3        RECOMMENDATIONS: SYLVA OVERLEY is a 70 y.o. 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  Mixed aortic valve disease (moderate aortic stenosis with history of rheumatic fever, and mild aortic regurgitation): Asymptomatic. Most recent echocardiogram results reviewed and noted above. Denies chest pain, syncope, heart failure symptoms. Repeat echocardiogram in August 2023.  Asymptomatic bilateral carotid artery stenosis: Asymptomatic And severity of bilateral carotid artery stenosis remains relatively stable. Tried up titration of statin therapy at the last office visit but patient discontinued Crestor 40 mg p.o. nightly due to myalgias.  He is currently on Crestor 20 mg p.o. nightly.  She still requests refill of 40 mg tablets. Start Zetia 10 mg p.o. daily Carotid duplex in 6 months to reevaluate disease progression.  Hypercholesterolemia: Currently on Crestor 20 mg p.o. nightly.  We will start Zetia. Did not tolerate Crestor 40 mg p.o. nightly-developed myalgias Follow lipid profile.  Former smoker: Educated on importance of complete smoking cessation  LBBB: Monitor for now.  Follow-up in 6 months or sooner if change in clinical status.  Patient is encouraged to follow-up with PCP as scheduled for the management of other chronic comorbid conditions.  FINAL MEDICATION LIST END OF ENCOUNTER: Meds ordered this encounter  Medications   rosuvastatin (CRESTOR) 40 MG tablet    Sig: Take 0.5 tablets (20 mg total) by mouth at bedtime.   ezetimibe (ZETIA) 10 MG tablet    Sig: Take 1 tablet (10 mg total) by mouth daily.    Dispense:  90 tablet    Refill:  0    Medications Discontinued During This Encounter  Medication Reason   rosuvastatin (CRESTOR) 40 MG tablet     Current Outpatient Medications:    ASPIRIN 81 PO, Take 1 tablet by mouth daily., Disp: , Rfl:    Cholecalciferol (VITAMIN D3) 25 MCG (1000 UT) CAPS, Take 1 capsule by mouth daily., Disp: , Rfl:    ezetimibe (ZETIA) 10 MG tablet, Take 1 tablet (10 mg total) by mouth daily., Disp: 90 tablet, Rfl:  0   gabapentin (NEURONTIN) 300 MG capsule, Take 1 capsule (300 mg total) by mouth 3 (three) times daily. Take a 300 mg capsule three times a day for two weeks Then a 300 mg capsule twice a day for two weeks Then a 300 mg capsule once a day for two weeks, Disp: 84 capsule, Rfl: 0   lisinopril (PRINIVIL,ZESTRIL) 10 MG tablet, Take 10 mg by mouth at bedtime. , Disp: , Rfl: 6   metoprolol succinate (TOPROL-XL) 100 MG  24 hr tablet, Take 100 mg by mouth every evening. , Disp: , Rfl:    Multiple Vitamins-Minerals (CENTRUM SILVER ADULT 50+ PO), Take 1 tablet by mouth daily., Disp: , Rfl:    rosuvastatin (CRESTOR) 40 MG tablet, Take 0.5 tablets (20 mg total) by mouth at bedtime., Disp: , Rfl:   Orders Placed This Encounter  Procedures   EKG 12-Lead   PCV ECHOCARDIOGRAM COMPLETE   PCV CAROTID DUPLEX (BILATERAL)   --Continue cardiac medications as reconciled in final medication list. --Return in about 6 months (around 05/26/2021) for Follow up for carotid disease  follow-up carotid duplex results. Or sooner if needed. --Continue follow-up with your primary care physician regarding the management of your other chronic comorbid conditions.  Patient's questions and concerns were addressed to her satisfaction. She voices understanding of the instructions provided during this encounter.   This note was created using a voice recognition software as a result there may be grammatical errors inadvertently enclosed that do not reflect the nature of this encounter. Every attempt is made to correct such errors.  Rex Kras, [place], [facility]  Pager: [phone] Office: [phone]

## 2021-02-06 ENCOUNTER — Telehealth: Payer: Self-pay | Admitting: Cardiology

## 2021-02-06 NOTE — Telephone Encounter (Signed)
She can hold off for now.  Prior to next visit if she has her cholesterol checked w/ PCP have her bring in a copy otherwise, we can check it.  In the meantime, make sure she makes every effort not to eat foods high in cholesterol.

## 2021-02-06 NOTE — Telephone Encounter (Signed)
Spoke to patient she is aware to hold off rx and she mention she will f/u with her PCP around Dec and will bring her labs

## 2021-02-06 NOTE — Telephone Encounter (Signed)
Patient says ezetimibe is not agreeing with her - she is experiencing muscle and joint pain; she says she cannot lift her legs high off of the ground or have as much use of her arms because of the pain.

## 2021-02-18 ENCOUNTER — Other Ambulatory Visit: Payer: Self-pay | Admitting: Cardiology

## 2021-02-18 DIAGNOSIS — E7800 Pure hypercholesterolemia, unspecified: Secondary | ICD-10-CM

## 2021-02-18 DIAGNOSIS — I6523 Occlusion and stenosis of bilateral carotid arteries: Secondary | ICD-10-CM

## 2021-05-16 ENCOUNTER — Other Ambulatory Visit: Payer: PRIVATE HEALTH INSURANCE

## 2021-05-18 ENCOUNTER — Ambulatory Visit: Payer: Medicare Other

## 2021-05-18 ENCOUNTER — Other Ambulatory Visit: Payer: Self-pay

## 2021-05-18 DIAGNOSIS — I6523 Occlusion and stenosis of bilateral carotid arteries: Secondary | ICD-10-CM

## 2021-05-25 ENCOUNTER — Other Ambulatory Visit: Payer: PRIVATE HEALTH INSURANCE

## 2021-05-25 NOTE — Progress Notes (Signed)
Called pt, no answer. Left vm requesting call back?

## 2021-05-25 NOTE — Progress Notes (Signed)
Patient called back, I discussed CAD results with her. She confirmed she will be here at scheduled appointment.

## 2021-05-26 ENCOUNTER — Other Ambulatory Visit: Payer: PRIVATE HEALTH INSURANCE

## 2021-06-01 ENCOUNTER — Ambulatory Visit: Payer: Medicare Other | Admitting: Cardiology

## 2021-06-01 ENCOUNTER — Encounter: Payer: Self-pay | Admitting: Cardiology

## 2021-06-01 ENCOUNTER — Other Ambulatory Visit: Payer: Self-pay

## 2021-06-01 VITALS — BP 154/84 | HR 65 | Temp 97.6°F | Resp 16 | Ht 61.0 in | Wt 175.0 lb

## 2021-06-01 DIAGNOSIS — E6609 Other obesity due to excess calories: Secondary | ICD-10-CM

## 2021-06-01 DIAGNOSIS — I447 Left bundle-branch block, unspecified: Secondary | ICD-10-CM

## 2021-06-01 DIAGNOSIS — I10 Essential (primary) hypertension: Secondary | ICD-10-CM

## 2021-06-01 DIAGNOSIS — I6523 Occlusion and stenosis of bilateral carotid arteries: Secondary | ICD-10-CM

## 2021-06-01 DIAGNOSIS — Z87891 Personal history of nicotine dependence: Secondary | ICD-10-CM

## 2021-06-01 DIAGNOSIS — E7800 Pure hypercholesterolemia, unspecified: Secondary | ICD-10-CM

## 2021-06-01 DIAGNOSIS — I350 Nonrheumatic aortic (valve) stenosis: Secondary | ICD-10-CM

## 2021-06-01 DIAGNOSIS — Z8679 Personal history of other diseases of the circulatory system: Secondary | ICD-10-CM

## 2021-06-01 MED ORDER — EZETIMIBE 10 MG PO TABS: 10.0000 mg | ORAL_TABLET | Freq: Every day | ORAL | 0 refills | Status: DC

## 2021-06-01 NOTE — Progress Notes (Signed)
Darlene Kelly Date of Birth: 08-Sep-1950 MRN: [id number] Primary Care Provider:Williams, Gracelyn Nurse, PA-C Former Cardiology Providers: Dr. Vear Clock, Jeri Lager, APRN, FNP-C Primary Cardiologist: Rex Kras, DO, [facility] (established care 10/12/2019)  Date: 06/01/21 Last Office Visit: 11/23/2020  Chief Complaint  Patient presents with   carotid disease     Follow-up    HPI  Darlene Kelly is a 71 y.o.  female who presents to the office with a chief complaint of " 19-monthfollow-up for aortic stenosis and carotid artery disease management." Patient's past medical history and cardiovascular risk factors include: Mild to moderate valvular heart disease, hypertension, hyperlipidemia, history of right-sided breast cancer status post chemo/radiation/surgery, bilateral carotid artery atherosclerosis, former smoker, hx of rheumatic fever, postmenopausal female, advanced age.  Patient presents today for follow-up for aortic stenosis and carotid disease management.  Patient has known history of moderate aortic stenosis which is currently managed medically as he is currently asymptomatic.  Since last office visit she denies angina pectoris, heart failure symptoms, or syncope.  She has a follow-up echo scheduled in August 2022.   At last office visit given her bilateral carotid artery stenosis recommended up titration of both statins and the addition of Zetia.  After taking the medications with 2-3 weeks patient started having myalgias and therefore went back down to the original dose of her statins and also stopped Zetia.  Repeat carotid duplex notes improvement in the right ICA disease in the left ICA remains stable.  Clinically she remains asymptomatic.  She brings in her blood pressure log for review as well as fasting lipid profile.  Her blood pressure log has been scanned into the media section and fasting lipid profile noted below for further reference.  Currently denies angina pectoris  or heart failure symptoms.  No hospitalizations or urgent care visits since last office encounter.  FUNCTIONAL STATUS: Walks 2-4 miles per day and has joined the [employer]   ALLERGIES: Allergies  Allergen Reactions   Latex Itching, Dermatitis and Rash   Lipitor [Atorvastatin] Other (See Comments)    Bad leg cramps   MEDICATION LIST PRIOR TO VISIT: Current Outpatient Medications on File Prior to Visit  Medication Sig Dispense Refill   ASPIRIN 81 PO Take 1 tablet by mouth daily.     Cholecalciferol (VITAMIN D3) 25 MCG (1000 UT) CAPS Take 1 capsule by mouth daily.     Coenzyme Q10 (CO Q 10 PO) Take 200 mg by mouth daily at 12 noon.     gabapentin (NEURONTIN) 300 MG capsule Take 1 capsule (300 mg total) by mouth 3 (three) times daily. Take a 300 mg capsule three times a day for two weeks Then a 300 mg capsule twice a day for two weeks Then a 300 mg capsule once a day for two weeks 84 capsule 0   lisinopril (PRINIVIL,ZESTRIL) 10 MG tablet Take 10 mg by mouth at bedtime.   6   Melatonin 10 MG TABS Take 1 tablet by mouth daily as needed.     metoprolol succinate (TOPROL-XL) 100 MG 24 hr tablet Take 100 mg by mouth every evening.      Multiple Vitamins-Minerals (CENTRUM SILVER ADULT 50+ PO) Take 1 tablet by mouth daily.     rosuvastatin (CRESTOR) 40 MG tablet Take 0.5 tablets (20 mg total) by mouth at bedtime.     vitamin C (ASCORBIC ACID) 500 MG tablet Take 500 mg by mouth daily.     No current facility-administered medications on file prior  to visit.    PAST MEDICAL HISTORY: Past Medical History:  Diagnosis Date   Breast cancer ([place])    Breast cancer of upper-outer quadrant of right female breast ([place]) 07/16/2014   Diabetes mellitus without complication (HCC)    Heart murmur    Heart murmur    from rhumatic fever as a child   Hypertension    Personal history of chemotherapy 2016   Personal history of radiation therapy    Skin cancer     PAST SURGICAL HISTORY: Past Surgical History:   Procedure Laterality Date   BREAST BIOPSY Right 07/13/2014   malignant   BREAST LUMPECTOMY Right 02/11/2015   malignant   PORT-A-CATH REMOVAL Left 02/11/2015   Procedure: REMOVAL PORT-A-CATH;  Surgeon: [name], MD;  Location: [place];  Service: General;  Laterality: Left;   PORTACATH PLACEMENT N/A 08/03/2014   Procedure: INSERTION PORT-A-CATH;  Surgeon: [name], MD;  Location: [facility];  Service: General;  Laterality: N/A;   RADIOACTIVE SEED GUIDED PARTIAL MASTECTOMY WITH AXILLARY SENTINEL LYMPH NODE BIOPSY Right 02/11/2015   Procedure: RIGHT RADIOACTIVE SEED LOCALIZATION LUMPECTOMY  WITH  RIGHT AXILLARY SENTINEL LYMPH NODE BIOPSY;  Surgeon: [name], MD;  Location: [place];  Service: General;  Laterality: Right;   TOTAL KNEE ARTHROPLASTY Left 01/19/2019   Procedure: TOTAL KNEE ARTHROPLASTY;  Surgeon: Gaynelle Arabian, MD;  Location: [facility];  Service: Orthopedics;  Laterality: Left;  79mn    FAMILY HISTORY: The patient's family history includes Heart disease in her brother, father, and mother; Lung cancer in her father.   SOCIAL HISTORY:  The patient  reports that she quit smoking about 35 years ago. Her smoking use included cigarettes. She has a 6.00 pack-year smoking history. She has never used smokeless tobacco. She reports current alcohol use of about 3.0 standard drinks per week. She reports that she does not use drugs.  Review of Systems  Constitutional: Negative for chills and fever.  HENT:  Negative for hoarse voice and nosebleeds.   Eyes:  Negative for discharge, double vision and pain.  Cardiovascular:  Negative for chest pain, claudication, dyspnea on exertion, leg swelling, near-syncope, orthopnea, palpitations, paroxysmal nocturnal dyspnea and syncope.  Respiratory:  Negative for hemoptysis and shortness of breath.   Musculoskeletal:  Negative for muscle cramps and myalgias.  Gastrointestinal:  Negative for abdominal pain, constipation, diarrhea,  hematemesis, hematochezia, melena, nausea and vomiting.  Neurological:  Negative for dizziness and light-headedness.   PHYSICAL EXAM: Vitals with BMI 06/01/2021 06/01/2021 11/23/2020  Height - _0  _1   Weight - 175 lbs 175 lbs 6 oz  BMI - 325.95363.87 Systolic [id number] Diastolic 84 75 87  Pulse 65 66 71    CONSTITUTIONAL: Well-developed and well-nourished. No acute distress.  SKIN: Skin is warm and dry. No rash noted. No cyanosis. No pallor. No jaundice HEAD: Normocephalic and atraumatic.  EYES: No scleral icterus MOUTH/THROAT: Moist oral membranes.  NECK: No JVD present. No thyromegaly noted. left carotid bruits  LYMPHATIC: No visible cervical adenopathy.  CHEST Normal respiratory effort. No intercostal retractions  LUNGS: Clear to auscultation bilaterally. No stridor. No wheezes. No rales.  CARDIOVASCULAR: Regular rate and rhythm, positive SO8-C1 soft holosystolic murmur heard at the apex, 3/6 systolic ejection murmur heard at the second right upper costal space, no gallops or rubs. ABDOMINAL: Soft, nontender, nondistended, positive bowel sounds in all 4 quadrants, no apparent ascites.  EXTREMITIES: No peripheral edema, warm to touch, 2+ DP and PT pulses. HEMATOLOGIC: No  significant bruising NEUROLOGIC: Oriented to person, place, and time. Nonfocal. Normal muscle tone.  PSYCHIATRIC: Normal mood and affect. Normal behavior. Cooperative  CARDIAC DATABASE: EKG: 06/01/2021: Sinus rhythm, 76 bpm, left bundle branch block.  Echocardiogram: 10/07/2019: LVEF 55 to 60%, moderate LVH, indeterminate diastolic filling pattern, elevated LAP. Moderate aortic stenosis (peak velocity 3.29ms, MG 226mG, AVA 0.9cm2, DI 0.3).     05/19/2020: LVEF 55%, mild LVH, normal wall motion, grade 1 diastolic impairment, normal LAP, moderately dilated left atrium, posterior MAC, mild to moderate MR, mild TR, mild aortic regurgitation, moderate aortic stenosis (Vmax 3.2 m/sec, mean PG 26 mmHg, AVA 0.8 cm2  by continuity equation. DVI of 0.3).   Stress Testing:  Lexiscan Myoview Stress Test 01/12/2019: Stress EKG is non-diagnostic, as this is a pharmacological stress test. In addition, Rest and stress EKG show sinus rhythm, LBBB.  SPECT images reveal basal to apical inferolateral, basal inferior, basal inferoseptal, seen both on stress and rest images. While tissue attenuation is present, ischemia in this region cannot be completely excluded.  Stress LVEF is 73%. Low risk study. No prior studies for comparison.  Heart Catheterization: None  Carotid artery duplex 05/18/2021:  Duplex suggests stenosis in the right internal carotid artery (1-15%).  Right CCA bifurcation with severe calcific plaque with <50% stenosis.  Duplex suggests stenosis in the left internal carotid artery (50-69%).  Antegrade right vertebral artery flow.  Compared to the study done on 11/02/2020, right ICA stenosis of 50 to 69% not demonstrated in the present study. Follow up in six months is  appropriate if clinically indicated.  LABORATORY DATA: CBC Latest Ref Rng & Units 02/09/2020 02/02/2019 01/21/2019  WBC 4.0 - 10.5 K/uL 6.4 6.2 11.7(H)  Hemoglobin 12.0 - 15.0 g/dL 13.8 10.7(L) 9.5(L)  Hematocrit 36.0 - 46.0 % 40.0 32.4(L) 28.3(L)  Platelets 150 - 400 K/uL 186 274 132(L)    CMP Latest Ref Rng & Units 07/25/2020 07/07/2020 02/09/2020  Glucose 65 - 99 mg/dL 147(H) 162(H) 144(H)  BUN 8 - 27 mg/dL _0 Creatinine 0.57 - 1.00 mg/dL 0.94 1.09(H) 0.74  Sodium 134 - 144 mmol/L 136 141 137  Potassium 3.5 - 5.2 mmol/L 4.9 5.1 4.6  Chloride 96 - 106 mmol/L 100 101 105  CO2 20 - 29 mmol/L _1 Calcium 8.7 - 10.3 mg/dL 9.8 10.2 9.1  Total Protein 6.0 - 8.5 g/dL - 6.8 6.8  Total Bilirubin 0.0 - 1.2 mg/dL - 0.3 0.6  Alkaline Phos 44 - 121 IU/L - 58 44  AST 0 - 40 IU/L - 24 25  ALT 0 - 32 IU/L - 20 21    Lipid Panel  03/11/2018- cholesterol-127, LDL-78, HDL-43, triglycerides-80  External Labs: Collected:  03/04/2020 provided by the patient Creatinine 0.7 mg/dL. eGFR: 89 mL/min per 1.73 m Lipid profile: Total cholesterol 158, triglycerides 135, HDL 51, LDL 93, non-HDL 107 Hemoglobin A1c: 5.9 AST 24, ALT 19, alkaline phosphatase 41 (all within normal limits)  External Labs: Collected: 03/20/2021. Total cholesterol 176, triglycerides 110, HDL 57, direct LDL 102, non-HDL 119.   Cardiac Panel (last 3 results) No results for input(s): CKTOTAL, CKMB, TROPONINIHS, RELINDX in the last 72 hours.  IMPRESSION:    ICD-10-CM   1. Asymptomatic bilateral carotid artery stenosis  I65.23 EKG 12-Lead    PCV CAROTID DUPLEX (BILATERAL)    ezetimibe (ZETIA) 10 MG tablet    2. Moderate aortic stenosis  I35.0     3. Hx of rheumatic fever  Z86.79  4. Essential hypertension  I10     5. LBBB (left bundle branch block)  I44.7     6. Hypercholesterolemia  E78.00 ezetimibe (ZETIA) 10 MG tablet    7. Former smoker  Z87.891     37. Class 1 obesity due to excess calories with serious comorbidity and body mass index (BMI) of 33.0 to 33.9 in adult  E66.09    Z68.33        RECOMMENDATIONS: Darlene Kelly is a 71 y.o. 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  Asymptomatic bilateral carotid artery stenosis Improving. Based on her recent carotid duplex right ICA disease has improved and the left ICA disease remains stable.  Continue aspirin and statin therapy. Patient did not tolerate the up titration of Crestor to 40 mg p.o. nightly.  Currently taking 20 mg p.o. nightly. Recommended a retrial of Zetia 10 mg p.o. daily.  Prescription sent to the pharmacy. Repeat carotid duplex in 1 year in February 2024 Monitor  for now  Moderate aortic stenosis with history of rheumatic fever: Currently asymptomatic. Scheduled for repeat echo in August 2023 for monitoring of disease progression. Patient is asked to seek medical attention if she has angina pectoris, heart failure symptoms, or syncope Monitor for now  Essential hypertension Office blood pressures are not well controlled. Home blood pressures are very well controlled blood pressure log scanned into the media section. Continue medical therapy. Reemphasized the importance of medication compliance.  Hypercholesterolemia Currently on Crestor 20 mg p.o. nightly. Restart Zetia 10 mg p.o. daily.  30-day prescription provided if she does well she will call back for refill. External lipids reviewed and noted above for further reference.  As part of today's office visit discussed management of at least 2 chronic comorbid conditions, independently reviewed Of her blood pressure log and external labs and noted above for further reference, prescribed pharmacological therapy, discussed medication profile, her questions and concerns were addressed to her satisfaction.  FINAL MEDICATION LIST END OF ENCOUNTER: Meds ordered this encounter  Medications   ezetimibe (ZETIA) 10 MG tablet    Sig: Take 1 tablet (10 mg total) by mouth daily.    Dispense:  30 tablet    Refill:  0    Medications Discontinued During This Encounter  Medication Reason   ezetimibe (ZETIA) 10 MG tablet     Current Outpatient Medications:    ASPIRIN 81 PO, Take 1 tablet by mouth daily., Disp: , Rfl:    Cholecalciferol (VITAMIN D3) 25 MCG (1000 UT) CAPS, Take 1 capsule by mouth daily., Disp: , Rfl:    Coenzyme Q10 (CO Q 10 PO), Take 200 mg by mouth daily at 12 noon., Disp: , Rfl:    gabapentin (NEURONTIN) 300 MG capsule, Take 1 capsule (300 mg total) by mouth 3 (three) times daily. Take a 300 mg capsule three times a day for two weeks Then a 300 mg capsule twice a day for two weeks Then  a 300 mg capsule once a day for two weeks, Disp: 84 capsule, Rfl: 0   lisinopril (PRINIVIL,ZESTRIL) 10 MG tablet, Take 10 mg by mouth at bedtime. , Disp: , Rfl: 6   Melatonin 10 MG TABS, Take 1 tablet by mouth daily as needed., Disp: , Rfl:    metoprolol succinate (TOPROL-XL) 100 MG 24 hr  tablet, Take 100 mg by mouth every evening. , Disp: , Rfl:    Multiple Vitamins-Minerals (CENTRUM SILVER ADULT 50+ PO), Take 1 tablet by mouth daily., Disp: , Rfl:    rosuvastatin (CRESTOR) 40 MG tablet, Take 0.5 tablets (20 mg total) by mouth at bedtime., Disp: , Rfl:    vitamin C (ASCORBIC ACID) 500 MG tablet, Take 500 mg by mouth daily., Disp: , Rfl:    ezetimibe (ZETIA) 10 MG tablet, Take 1 tablet (10 mg total) by mouth daily., Disp: 30 tablet, Rfl: 0  Orders Placed This Encounter  Procedures   EKG 12-Lead   PCV CAROTID DUPLEX (BILATERAL)   --Continue cardiac medications as reconciled in final medication list. --Return in about 6 months (around 12/02/2021) for Follow up aortic stenosis. Or sooner if needed. --Continue follow-up with your primary care physician regarding the management of your other chronic comorbid conditions.  Patient's questions and concerns were addressed to her satisfaction. She voices understanding of the instructions provided during this encounter.   This note was created using a voice recognition software as a result there may be grammatical errors inadvertently enclosed that do not reflect the nature of this encounter. Every attempt is made to correct such errors.  Rex Kras, [place], [facility]  Pager: [phone] Office: [phone]

## 2021-08-29 ENCOUNTER — Other Ambulatory Visit: Payer: Self-pay | Admitting: Physician Assistant

## 2021-08-29 DIAGNOSIS — Z1231 Encounter for screening mammogram for malignant neoplasm of breast: Secondary | ICD-10-CM

## 2021-08-31 ENCOUNTER — Ambulatory Visit
Admission: RE | Admit: 2021-08-31 | Discharge: 2021-08-31 | Disposition: A | Discharge status: Home / Self Care | Payer: Medicare Other | Source: Ambulatory Visit | Attending: Physician Assistant | Admitting: Physician Assistant

## 2021-08-31 DIAGNOSIS — Z1231 Encounter for screening mammogram for malignant neoplasm of breast: Secondary | ICD-10-CM

## 2021-11-23 ENCOUNTER — Ambulatory Visit: Payer: Medicare Other

## 2021-11-23 DIAGNOSIS — I350 Nonrheumatic aortic (valve) stenosis: Secondary | ICD-10-CM

## 2021-11-28 NOTE — Progress Notes (Signed)
Called patient, NA, LMAM. CO/student

## 2021-11-28 NOTE — Progress Notes (Signed)
Called and spoke with patient regarding her echocardiogram results.

## 2021-12-05 ENCOUNTER — Ambulatory Visit: Payer: PRIVATE HEALTH INSURANCE | Admitting: Cardiology

## 2021-12-18 ENCOUNTER — Ambulatory Visit: Payer: Medicare Other | Admitting: Cardiology

## 2021-12-18 ENCOUNTER — Encounter: Payer: Self-pay | Admitting: Cardiology

## 2021-12-18 VITALS — BP 141/69 | HR 66 | Temp 98.3°F | Resp 16 | Ht 61.0 in | Wt 171.0 lb

## 2021-12-18 DIAGNOSIS — E6609 Other obesity due to excess calories: Secondary | ICD-10-CM

## 2021-12-18 DIAGNOSIS — Z8679 Personal history of other diseases of the circulatory system: Secondary | ICD-10-CM

## 2021-12-18 DIAGNOSIS — I10 Essential (primary) hypertension: Secondary | ICD-10-CM

## 2021-12-18 DIAGNOSIS — E7800 Pure hypercholesterolemia, unspecified: Secondary | ICD-10-CM

## 2021-12-18 DIAGNOSIS — I6523 Occlusion and stenosis of bilateral carotid arteries: Secondary | ICD-10-CM

## 2021-12-18 DIAGNOSIS — I447 Left bundle-branch block, unspecified: Secondary | ICD-10-CM

## 2021-12-18 DIAGNOSIS — I350 Nonrheumatic aortic (valve) stenosis: Secondary | ICD-10-CM

## 2021-12-18 DIAGNOSIS — Z87891 Personal history of nicotine dependence: Secondary | ICD-10-CM

## 2021-12-18 NOTE — Progress Notes (Signed)
Darlene Kelly Date of Birth: September 25, 1950 MRN: [id number] Primary Care Provider:Williams, Gracelyn Nurse, PA-C Former Cardiology Providers: Dr. Vear Clock, Jeri Lager, APRN, FNP-C Primary Cardiologist: Rex Kras, DO, [facility] (established care 10/12/2019)  Date: 12/18/21 Last Office Visit: 06/01/2021  Chief Complaint  Patient presents with   Aortic Stenosis   Follow-up    HPI  Darlene Kelly is a 71 y.o.  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  Patient is being followed for aortic stenosis management as well as carotid disease.  Noted to have moderate aortic stenosis but remains asymptomatic clinically.  With regards to her carotid disease she was recommended to uptitrate Crestor to 40 mg p.o. nightly but developed myalgias and therefore went back down to 20 mg p.o. nightly.  At the last office visit she was recommended to retrial Zetia 10 mg p.o. daily.  Since last office visit, denies anginal discomfort or heart failure symptoms.  No hospitalizations or urgent care visits for cardiovascular symptoms.  She did try Zetia 10 mg p.o. daily but after taking it for 3 weeks was having myalgias and therefore stopped it.  Her overall physical activity level remains relatively stable.  Instead of going to the [facility] regularly she is currently busy ambulating her puppies and also building decks during the summer months.  FUNCTIONAL STATUS: See above  ALLERGIES: Allergies  Allergen Reactions   Latex Itching, Dermatitis and Rash   Lipitor [Atorvastatin] Other (See Comments)    Bad leg cramps   MEDICATION LIST PRIOR TO VISIT: Current Outpatient Medications on File Prior to Visit  Medication Sig  Dispense Refill   ASPIRIN 81 PO Take 1 tablet by mouth daily.     Cholecalciferol (VITAMIN D3) 25 MCG (1000 UT) CAPS Take 1 capsule by mouth daily.     Coenzyme Q10 (CO Q 10 PO) Take 200 mg by mouth daily at 12 noon.     gabapentin (NEURONTIN) 300 MG capsule Take 1 capsule (300 mg total) by mouth 3 (three) times daily. Take a 300 mg capsule three times a day for two weeks Then a 300 mg capsule twice a day for two weeks Then a 300 mg capsule once a day for two weeks 84 capsule 0   lisinopril (PRINIVIL,ZESTRIL) 10 MG tablet Take 10 mg by mouth at bedtime.   6   Melatonin 10 MG TABS Take 1 tablet by mouth daily as needed.     metoprolol succinate (TOPROL-XL) 100 MG 24 hr tablet Take 100 mg by mouth every evening.      Multiple Vitamins-Minerals (CENTRUM SILVER ADULT 50+ PO) Take 1 tablet by mouth daily.     rosuvastatin (CRESTOR) 40 MG tablet Take 0.5 tablets (20 mg total) by mouth at bedtime.     vitamin C (ASCORBIC ACID) 500 MG tablet Take 500 mg by mouth daily.     No current facility-administered medications on file prior to visit.    PAST MEDICAL HISTORY: Past Medical History:  Diagnosis Date   Breast cancer ([place])    Breast cancer of upper-outer quadrant of right female breast ([place]) 07/16/2014   Diabetes mellitus without complication (HCC)    Heart murmur    Heart murmur    from rhumatic fever as a child   Hypertension    Personal history of chemotherapy  2016   Personal history of radiation therapy    Skin cancer     PAST SURGICAL HISTORY: Past Surgical History:  Procedure Laterality Date   BREAST BIOPSY Right 07/13/2014   malignant   BREAST LUMPECTOMY Right 02/11/2015   malignant   PORT-A-CATH REMOVAL Left 02/11/2015   Procedure: REMOVAL PORT-A-CATH;  Surgeon: [name], MD;  Location: [place];  Service: General;  Laterality: Left;   PORTACATH PLACEMENT N/A 08/03/2014   Procedure: INSERTION PORT-A-CATH;  Surgeon: [name], MD;  Location: [facility];  Service:  General;  Laterality: N/A;   RADIOACTIVE SEED GUIDED PARTIAL MASTECTOMY WITH AXILLARY SENTINEL LYMPH NODE BIOPSY Right 02/11/2015   Procedure: RIGHT RADIOACTIVE SEED LOCALIZATION LUMPECTOMY  WITH  RIGHT AXILLARY SENTINEL LYMPH NODE BIOPSY;  Surgeon: [name], MD;  Location: [place];  Service: General;  Laterality: Right;   TOTAL KNEE ARTHROPLASTY Left 01/19/2019   Procedure: TOTAL KNEE ARTHROPLASTY;  Surgeon: Gaynelle Arabian, MD;  Location: [facility];  Service: Orthopedics;  Laterality: Left;  62mn    FAMILY HISTORY: The patient's family history includes Heart disease in her brother, father, and mother; Lung cancer in her father.   SOCIAL HISTORY:  The patient  reports that she quit smoking about 35 years ago. Her smoking use included cigarettes. She has a 6.00 pack-year smoking history. She has never used smokeless tobacco. She reports current alcohol use of about 3.0 standard drinks of alcohol per week. She reports that she does not use drugs.  Review of Systems  Cardiovascular:  Negative for chest pain, claudication, dyspnea on exertion, irregular heartbeat, leg swelling, near-syncope, orthopnea, palpitations, paroxysmal nocturnal dyspnea and syncope.  Respiratory:  Negative for shortness of breath.   Hematologic/Lymphatic: Negative for bleeding problem.  Musculoskeletal:  Negative for muscle cramps and myalgias.  Neurological:  Negative for dizziness and light-headedness.    PHYSICAL EXAM:    12/18/2021   11:06 AM 06/01/2021   10:18 AM 06/01/2021   10:12 AM  Vitals with BMI  Height 5' 1"  5' 1"  Weight 171 lbs  175 lbs  BMI 309.98 333.82 Systolic [id number] Diastolic 69 84 75  Pulse 66 65 66   Physical Exam  Constitutional: No distress.  Age appropriate, hemodynamically stable.   Neck: No JVD present.  Cardiovascular: Normal rate, regular rhythm, S1 normal, S2 normal, intact distal pulses and normal pulses. Exam reveals no gallop, no S3 and no S4.  Murmur  heard. Midsystolic murmur is present with a grade of 3/6 at the upper right sternal border. Blowing holosystolic  murmur of grade 3/6 is also present at the apex. Pulses:      Carotid pulses are  on the right side with bruit and  on the left side with bruit. Pulmonary/Chest: Effort normal and breath sounds normal. No stridor. She has no wheezes. She has no rales.  Abdominal: Soft. Bowel sounds are normal. She exhibits no distension. There is no abdominal tenderness.  Musculoskeletal:        General: No edema.     Cervical back: Neck supple.  Neurological: She is alert and oriented to person, place, and time. She has intact cranial nerves (2-12).  Skin: Skin is warm and moist.   CARDIAC DATABASE: EKG: 06/01/2021: Sinus rhythm, 76 bpm, left bundle branch block. 12/18/2021: Normal sinus rhythm, 63 bpm, nonspecific T wave abnormality, without underlying injury pattern.  Echocardiogram: 10/07/2019: LVEF 55 to 60%, moderate LVH, indeterminate diastolic filling pattern, elevated LAP. Moderate aortic stenosis (peak  velocity 3.48ms, MG 252mG, AVA 0.9cm2, DI 0.3).     05/19/2020: LVEF 55%, mild LVH, normal wall motion, grade 1 diastolic impairment, normal LAP, moderately dilated left atrium, posterior MAC, mild to moderate MR, mild TR, mild aortic regurgitation, moderate aortic stenosis (Vmax 3.2 m/sec, mean PG 26 mmHg, AVA 0.8 cm2 by continuity equation. DVI of 0.3).   11/23/2021:  Normal LV systolic function with visual EF 60-65%. Left ventricle cavity is normal in size. Normal left ventricular wall thickness. Normal global wall motion. Doppler evidence of grade II (pseudonormal) diastolic dysfunction, elevated LAP. Left atrial cavity is moderately dilated at 45 ml/m^2.  Trileaflet aortic valve. Moderate to severe aortic stenosis. Trace aortic regurgitation. Moderate aortic valve leaflet calcification. DVI 0.3 AVA (VTI) measures 1.1 cm^2. AV Mean Grad measures 21.4 mmHg. AV Pk Vel measures 3.42  m/s.  Moderate (Grade II) mitral regurgitation. Moderate calcification of the mitral valve annulus. Mild mitral valve leaflet thickening.  Structurally normal tricuspid valve.  Mild tricuspid regurgitation. No evidence of pulmonary hypertension.  No significant change since prior  ~~images personally reviewed severity of AS remains moderate.   Stress Testing:  Lexiscan Myoview Stress Test 01/12/2019: Stress EKG is non-diagnostic, as this is a pharmacological stress test. In addition, Rest and stress EKG show sinus rhythm, LBBB.  SPECT images reveal basal to apical inferolateral, basal inferior, basal inferoseptal, seen both on stress and rest images. While tissue attenuation is present, ischemia in this region cannot be completely excluded.  Stress LVEF is 73%. Low risk study. No prior studies for comparison.  Heart Catheterization: None  Carotid artery duplex 05/18/2021:  Duplex suggests stenosis in the right internal carotid artery (1-15%).  Right CCA bifurcation with severe calcific plaque with <50% stenosis.  Duplex suggests stenosis in the left internal carotid artery (50-69%).  Antegrade right vertebral artery flow.  Compared to the study done on 11/02/2020, right ICA stenosis of 50 to 69% not demonstrated in the present study. Follow up in six months is  appropriate if clinically indicated.  LABORATORY DATA:    Latest Ref Rng & Units 02/09/2020    9:01 AM 02/02/2019    7:54 AM 01/21/2019    2:53 AM  CBC  WBC 4.0 - 10.5 K/uL 6.4  6.2  11.7   Hemoglobin 12.0 - 15.0 g/dL 13.8  10.7  9.5   Hematocrit 36.0 - 46.0 % 40.0  32.4  28.3   Platelets 150 - 400 K/uL 186  274  132        Latest Ref Rng & Units 07/25/2020   11:19 AM 07/07/2020    8:28 AM 02/09/2020    9:01 AM  CMP  Glucose 65 - 99 mg/dL 147  162  144   BUN 8 - 27 mg/dL _0 Creatinine 0.57 - 1.00 mg/dL 0.94  1.09  0.74   Sodium 134 - 144 mmol/L 136  141  137   Potassium 3.5 - 5.2 mmol/L 4.9  5.1  4.6   Chloride  96 - 106 mmol/L 100  101  105   CO2 20 - 29 mmol/L _1 Calcium 8.7 - 10.3 mg/dL 9.8  10.2  9.1   Total Protein 6.0 - 8.5 g/dL  6.8  6.8   Total Bilirubin 0.0 - 1.2 mg/dL  0.3  0.6   Alkaline Phos 44 - 121 IU/L  58  44   AST 0 - 40 IU/L  24  25  ALT 0 - 32 IU/L  20  21     Lipid Panel  03/11/2018- cholesterol-127, LDL-78, HDL-43, triglycerides-80  External Labs: Collected: 03/04/2020 provided by the patient Creatinine 0.7 mg/dL. eGFR: 89 mL/min per 1.73 m Lipid profile: Total cholesterol 158, triglycerides 135, HDL 51, LDL 93, non-HDL 107 Hemoglobin A1c: 5.9 AST 24, ALT 19, alkaline phosphatase 41 (all within normal limits)  External Labs: Collected: 03/20/2021. Total cholesterol 176, triglycerides 110, HDL 57, direct LDL 102, non-HDL 119.   Cardiac Panel (last 3 results) No results for input(s): "CKTOTAL", "CKMB", "TROPONINIHS", "RELINDX" in the last 72 hours.  IMPRESSION:    ICD-10-CM   1. Moderate aortic stenosis  I35.0 EKG 12-Lead    2. Asymptomatic bilateral carotid artery stenosis  I65.23     3. Hx of rheumatic fever  Z86.79     4. Essential hypertension  I10     5. LBBB (left bundle branch block)  I44.7     6. Hypercholesterolemia  E78.00     7. Former smoker  Z87.891     50. Class 1 obesity due to excess calories with serious comorbidity and body mass index (BMI) of 33.0 to 33.9 in adult  E66.09    Z68.33        RECOMMENDATIONS: NAMI STRAWDER is a 71 y.o. 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  Moderate aortic stenosis Asymptomatic. Echocardiogram notes moderate aortic stenosis with mitral regurgitation. LVEF remains preserved. Patient denies  anginal discomfort, heart failure symptoms, or syncope. Monitor for now.  Asymptomatic bilateral carotid artery stenosis Denies any vision changes or focal neurological deficits. Scheduled to have a carotid duplex in February 2024. We will have lipids checked in December 2022. Patient is asked to be more cognizant with symptoms of vision changes predominantly left greater than right or focal neurological deficits and if present to go to the closest ER via EMS.  Essential hypertension All of his blood pressures are slightly above goal. Patient brings in her blood pressure log over the last 1 week and the numbers are relatively stable, at times overcorrected.  I have asked her to keep a log and to make sure that her systolics are usually greater than 120 mmHg. Medications reconciled.  Hypercholesterolemia Currently on rosuvastatin.   Had myalgias with the higher dose of rosuvastatin (40 mg) as well as Zetia 10 mg p.o. daily. We will have fasting lipid profile as part of her well visit in December 2023 and she will bring a copy at the following visit. Currently managed by primary care provider.  Class 1 obesity due to excess calories with serious comorbidity and body mass index (BMI) of 33.0 to 33.9 in adult Body mass index is 32.31 kg/m. I reviewed with the patient the importance of diet, regular physical activity/exercise, weight loss.   Patient is educated on increasing physical activity gradually as tolerated.  With the goal of moderate intensity exercise for 30 minutes a day 5 days a week.  FINAL MEDICATION LIST END OF ENCOUNTER: No orders of the defined types were placed in this encounter.   Medications Discontinued During This Encounter  Medication Reason   ezetimibe (ZETIA) 10 MG tablet Patient Preference     Current Outpatient Medications:    ASPIRIN 81 PO, Take 1 tablet by mouth daily., Disp: ,  Rfl:    Cholecalciferol (VITAMIN D3) 25 MCG (1000 UT) CAPS, Take 1 capsule by  mouth daily., Disp: , Rfl:    Coenzyme Q10 (CO Q 10 PO), Take 200 mg by mouth daily at 12 noon., Disp: , Rfl:    gabapentin (NEURONTIN) 300 MG capsule, Take 1 capsule (300 mg total) by mouth 3 (three) times daily. Take a 300 mg capsule three times a day for two weeks Then a 300 mg capsule twice a day for two weeks Then a 300 mg capsule once a day for two weeks, Disp: 84 capsule, Rfl: 0   lisinopril (PRINIVIL,ZESTRIL) 10 MG tablet, Take 10 mg by mouth at bedtime. , Disp: , Rfl: 6   Melatonin 10 MG TABS, Take 1 tablet by mouth daily as needed., Disp: , Rfl:    metoprolol succinate (TOPROL-XL) 100 MG 24 hr tablet, Take 100 mg by mouth every evening. , Disp: , Rfl:    Multiple Vitamins-Minerals (CENTRUM SILVER ADULT 50+ PO), Take 1 tablet by mouth daily., Disp: , Rfl:    rosuvastatin (CRESTOR) 40 MG tablet, Take 0.5 tablets (20 mg total) by mouth at bedtime., Disp: , Rfl:    vitamin C (ASCORBIC ACID) 500 MG tablet, Take 500 mg by mouth daily., Disp: , Rfl:   Orders Placed This Encounter  Procedures   EKG 12-Lead   --Continue cardiac medications as reconciled in final medication list. --Return in about 6 months (around 06/18/2022) for Follow up, Carotid disease. Or sooner if needed. --Continue follow-up with your primary care physician regarding the management of your other chronic comorbid conditions.  Patient's questions and concerns were addressed to her satisfaction. She voices understanding of the instructions provided during this encounter.   This note was created using a voice recognition software as a result there may be grammatical errors inadvertently enclosed that do not reflect the nature of this encounter. Every attempt is made to correct such errors.  Rex Kras, [place], [facility]  Pager: [phone] Office: [phone]

## 2022-05-22 ENCOUNTER — Other Ambulatory Visit: Payer: PRIVATE HEALTH INSURANCE

## 2022-05-31 ENCOUNTER — Ambulatory Visit: Payer: Medicare Other

## 2022-05-31 DIAGNOSIS — I6523 Occlusion and stenosis of bilateral carotid arteries: Secondary | ICD-10-CM

## 2022-06-07 NOTE — Progress Notes (Signed)
LMTCB

## 2022-06-07 NOTE — Progress Notes (Signed)
Advised patient of results. She verbalized understanding.

## 2022-06-18 ENCOUNTER — Ambulatory Visit: Payer: Medicare Other | Admitting: Cardiology

## 2022-06-18 ENCOUNTER — Encounter: Payer: Self-pay | Admitting: Cardiology

## 2022-06-18 VITALS — BP 122/54 | HR 80 | Resp 18 | Ht 61.0 in | Wt 176.8 lb

## 2022-06-18 DIAGNOSIS — I6523 Occlusion and stenosis of bilateral carotid arteries: Secondary | ICD-10-CM

## 2022-06-18 DIAGNOSIS — E7800 Pure hypercholesterolemia, unspecified: Secondary | ICD-10-CM

## 2022-06-18 DIAGNOSIS — I447 Left bundle-branch block, unspecified: Secondary | ICD-10-CM

## 2022-06-18 DIAGNOSIS — Z87891 Personal history of nicotine dependence: Secondary | ICD-10-CM

## 2022-06-18 DIAGNOSIS — Z8679 Personal history of other diseases of the circulatory system: Secondary | ICD-10-CM

## 2022-06-18 DIAGNOSIS — E6609 Other obesity due to excess calories: Secondary | ICD-10-CM

## 2022-06-18 DIAGNOSIS — I10 Essential (primary) hypertension: Secondary | ICD-10-CM

## 2022-06-18 DIAGNOSIS — I350 Nonrheumatic aortic (valve) stenosis: Secondary | ICD-10-CM

## 2022-06-18 MED ORDER — NEXLETOL 180 MG PO TABS: 180.0000 mg | ORAL_TABLET | Freq: Every day | ORAL | 0 refills | Status: DC

## 2022-06-18 MED ORDER — METOPROLOL SUCCINATE ER 50 MG PO TB24: 50.0000 mg | ORAL_TABLET | Freq: Every day | ORAL | 0 refills | Status: DC

## 2022-06-18 NOTE — Progress Notes (Signed)
Darlene Kelly Date of Birth: [birth year] MRN: [id number] Primary Care Provider:Williams, Gracelyn Nurse, PA-C Former Cardiology Providers: Dr. Vear Clock, Jeri Lager, APRN, FNP-C Primary Cardiologist: Rex Kras, DO, [facility] (established care 10/12/2019)  Date: 06/18/22 Last Office Visit: 12/18/2021  Chief Complaint  Patient presents with   carotid disease   Follow-up    6 months    HPI  Darlene Kelly is a 72 y.o.  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  She is followed by the practice for management of carotid disease and aortic stenosis.  She presents today for 15-month follow-up visit.  Since last office visit she had a carotid duplex in February 2024 which notes stable overall disease burden.  Left ICA stenosis suggestive of 50-69% stenosis per duplex criteria.  In the past uptitrated to Crestor to 40 mg p.o. nightly but she developed myalgias.  Also try to add Zetia 10 mg p.o. daily but 3 weeks later started to notice myalgias as well.  Clinically, denies any focal neurological deficits or vision changes.  With regards to aortic stenosis her last echo was in August 2023.  Denies chest pain, heart failure symptoms, or syncope.  At times patient has noted shortness of breath with initiation of physical activity but once she starts walking she is less short of breath.  She started taking magnesium supplements and recently has noted soft blood pressures at home which may be contributing to her feeling tired and fatigued.  Outside labs provided by the patient from December 2023 independently reviewed and noted below for further reference.   FUNCTIONAL STATUS: Continues to walk 1.5 miles per  day with her dogs.  ALLERGIES: Allergies  Allergen Reactions   Latex Itching, Dermatitis and Rash   Lipitor [Atorvastatin] Other (See Comments)    Bad leg cramps   MEDICATION LIST PRIOR TO VISIT: Current Outpatient Medications on File Prior to Visit  Medication Sig Dispense Refill   ASPIRIN 81 PO Take 1 tablet by mouth daily.     Cholecalciferol (VITAMIN D3) 25 MCG (1000 UT) CAPS Take 1 capsule by mouth daily.     Coenzyme Q10 (CO Q 10 PO) Take 200 mg by mouth daily at 12 noon.     gabapentin (NEURONTIN) 300 MG capsule Take 1 capsule (300 mg total) by mouth 3 (three) times daily. Take a 300 mg capsule three times a day for two weeks Then a 300 mg capsule twice a day for two weeks Then a 300 mg capsule once a day for two weeks 84 capsule 0   lisinopril (PRINIVIL,ZESTRIL) 10 MG tablet Take 10 mg by mouth at bedtime.   6   MAGNESIUM GLYCINATE PO Take 240 mg by mouth in the morning and at bedtime.     Melatonin 10 MG TABS Take 1 tablet by mouth daily as needed.     Multiple Vitamins-Minerals (CENTRUM SILVER ADULT 50+ PO) Take 1 tablet by mouth daily.     rosuvastatin (CRESTOR) 40 MG tablet Take 0.5 tablets (20 mg total) by mouth at bedtime.     vitamin C (ASCORBIC ACID) 500 MG tablet Take 500 mg by mouth daily.     No current facility-administered medications on file prior to visit.    PAST MEDICAL HISTORY: Past Medical History:  Diagnosis Date   Breast  cancer [facility])    Breast cancer of upper-outer quadrant of right female breast ([place]) 07/16/2014   Diabetes mellitus without complication ([place])    Heart murmur    Heart murmur    from rhumatic fever as a child   Hypertension    Personal history of chemotherapy 2016   Personal history of radiation therapy    Skin cancer     PAST SURGICAL HISTORY: Past Surgical History:  Procedure Laterality Date   BREAST BIOPSY Right 07/13/2014   malignant   BREAST LUMPECTOMY Right 02/11/2015   malignant   PORT-A-CATH REMOVAL Left 02/11/2015    Procedure: REMOVAL PORT-A-CATH;  Surgeon: [name], MD;  Location: [place];  Service: General;  Laterality: Left;   PORTACATH PLACEMENT N/A 08/03/2014   Procedure: INSERTION PORT-A-CATH;  Surgeon: [name], MD;  Location: [facility];  Service: General;  Laterality: N/A;   RADIOACTIVE SEED GUIDED PARTIAL MASTECTOMY WITH AXILLARY SENTINEL LYMPH NODE BIOPSY Right 02/11/2015   Procedure: RIGHT RADIOACTIVE SEED LOCALIZATION LUMPECTOMY  WITH  RIGHT AXILLARY SENTINEL LYMPH NODE BIOPSY;  Surgeon: [name], MD;  Location: [place];  Service: General;  Laterality: Right;   TOTAL KNEE ARTHROPLASTY Left 01/19/2019   Procedure: TOTAL KNEE ARTHROPLASTY;  Surgeon: Gaynelle Arabian, MD;  Location: [facility];  Service: Orthopedics;  Laterality: Left;  84min    FAMILY HISTORY: The patient's family history includes Heart disease in her brother, father, and mother; Lung cancer in her father.   SOCIAL HISTORY:  The patient  reports that she quit smoking about 36 years ago. Her smoking use included cigarettes. She has a 6.00 pack-year smoking history. She has never used smokeless tobacco. She reports current alcohol use of about 3.0 standard drinks of alcohol per week. She reports that she does not use drugs.  Review of Systems  Constitutional: Positive for malaise/fatigue.  Cardiovascular:  Negative for chest pain, claudication, dyspnea on exertion, irregular heartbeat, leg swelling, near-syncope, orthopnea, palpitations, paroxysmal nocturnal dyspnea and syncope.  Respiratory:  Positive for shortness of breath.   Hematologic/Lymphatic: Negative for bleeding problem.  Musculoskeletal:  Negative for muscle cramps and myalgias.  Neurological:  Negative for dizziness and light-headedness.    PHYSICAL EXAM:    06/18/2022    1:44 PM 12/18/2021   11:06 AM 06/01/2021   10:18 AM  Vitals with BMI  Height 5\' 1"  5\' 1"    Weight 176 lbs 13 oz 171 lbs   BMI [id number]   Systolic [id number]  Diastolic 54 69  84  Pulse 80 66 65   Physical Exam  Constitutional: No distress.  Age appropriate, hemodynamically stable.   Neck: No JVD present.  Cardiovascular: Normal rate, regular rhythm, S1 normal, S2 normal, intact distal pulses and normal pulses. Exam reveals no gallop, no S3 and no S4.  Murmur heard. Midsystolic murmur is present with a grade of 3/6 at the upper right sternal border. Blowing holosystolic  murmur of grade 3/6 is also present at the apex. Pulses:      Carotid pulses are  on the left side with bruit. Pulmonary/Chest: Effort normal and breath sounds normal. No stridor. She has no wheezes. She has no rales.  Abdominal: Soft. Bowel sounds are normal. She exhibits no distension. There is no abdominal tenderness.  Musculoskeletal:        General: No edema.     Cervical back: Neck supple.  Neurological: She is alert and oriented to person, place, and time. She has intact cranial nerves (2-12).  Skin:  Skin is warm and moist.   CARDIAC DATABASE: EKG: 06/01/2021: Sinus rhythm, 76 bpm, left bundle branch block. 12/18/2021: Normal sinus rhythm, 63 bpm, nonspecific T wave abnormality, without underlying injury pattern. 06/18/2022: Normal sinus rhythm, 71 bpm, without underlying ischemia or injury pattern.   Echocardiogram: 10/07/2019: LVEF 55 to 60%, moderate LVH, indeterminate diastolic filling pattern, elevated LAP. Moderate aortic stenosis (peak velocity 3.58m/s, MG 14mmHG, AVA 0.9cm2, DI 0.3).     05/19/2020: LVEF 55%, mild LVH, normal wall motion, grade 1 diastolic impairment, normal LAP, moderately dilated left atrium, posterior MAC, mild to moderate MR, mild TR, mild aortic regurgitation, moderate aortic stenosis (Vmax 3.2 m/sec, mean PG 26 mmHg, AVA 0.8 cm2 by continuity equation. DVI of 0.3).   11/23/2021:  Normal LV systolic function with visual EF 60-65%. Left ventricle cavity is normal in size. Normal left ventricular wall thickness. Normal global wall motion. Doppler evidence  of grade II (pseudonormal) diastolic dysfunction, elevated LAP. Left atrial cavity is moderately dilated at 45 ml/m^2.  Trileaflet aortic valve. Moderate to severe aortic stenosis. Trace aortic regurgitation. Moderate aortic valve leaflet calcification. DVI 0.3 AVA (VTI) measures 1.1 cm^2. AV Mean Grad measures 21.4 mmHg. AV Pk Vel measures 3.42 m/s.  Moderate (Grade II) mitral regurgitation. Moderate calcification of the mitral valve annulus. Mild mitral valve leaflet thickening.  Structurally normal tricuspid valve.  Mild tricuspid regurgitation. No evidence of pulmonary hypertension.  No significant change since prior  ~~images personally reviewed severity of AS remains moderate.   Stress Testing:  Lexiscan Myoview Stress Test 01/12/2019: Stress EKG is non-diagnostic, as this is a pharmacological stress test. In addition, Rest and stress EKG show sinus rhythm, LBBB.  SPECT images reveal basal to apical inferolateral, basal inferior, basal inferoseptal, seen both on stress and rest images. While tissue attenuation is present, ischemia in this region cannot be completely excluded.  Stress LVEF is 73%. Low risk study. No prior studies for comparison.  Heart Catheterization: None  Carotid artery duplex  05/31/2022:  Duplex suggests stenosis in the right internal carotid artery (1-15%). The right vessel geometry is tortuous. Duplex suggests stenosis in the left internal carotid artery (50-69%). Antegrade right vertebral artery flow. Antegrade left vertebral artery flow. There is heterogenous plaque in bilateral carotid arteries. Compared to the study done on 05/18/2021, no significant change. Follow up in six months is appropriate if clinically indicated.   LABORATORY DATA:    Latest Ref Rng & Units 02/09/2020    9:01 AM 02/02/2019    7:54 AM 01/21/2019    2:53 AM  CBC  WBC 4.0 - 10.5 K/uL 6.4  6.2  11.7   Hemoglobin 12.0 - 15.0 g/dL 13.8  10.7  9.5   Hematocrit 36.0 - 46.0 % 40.0  32.4   28.3   Platelets 150 - 400 K/uL 186  274  132        Latest Ref Rng & Units 07/25/2020   11:19 AM 07/07/2020    8:28 AM 02/09/2020    9:01 AM  CMP  Glucose 65 - 99 mg/dL 147  162  144   BUN 8 - 27 mg/dL 15  16  16    Creatinine 0.57 - 1.00 mg/dL 0.94  1.09  0.74   Sodium 134 - 144 mmol/L 136  141  137   Potassium 3.5 - 5.2 mmol/L 4.9  5.1  4.6   Chloride 96 - 106 mmol/L 100  101  105   CO2 20 - 29 mmol/L 23  20  24   Calcium 8.7 - 10.3 mg/dL 9.8  10.2  9.1   Total Protein 6.0 - 8.5 g/dL  6.8  6.8   Total Bilirubin 0.0 - 1.2 mg/dL  0.3  0.6   Alkaline Phos 44 - 121 IU/L  58  44   AST 0 - 40 IU/L  24  25   ALT 0 - 32 IU/L  20  21     Lipid Panel  03/11/2018- cholesterol-127, LDL-78, HDL-43, triglycerides-80  External Labs: Collected: 03/04/2020 provided by the patient Creatinine 0.7 mg/dL. eGFR: 89 mL/min per 1.73 m Lipid profile: Total cholesterol 158, triglycerides 135, HDL 51, LDL 93, non-HDL 107 Hemoglobin A1c: 5.9 AST 24, ALT 19, alkaline phosphatase 41 (all within normal limits)  External Labs: Collected: 03/20/2021. Total cholesterol 176, triglycerides 110, HDL 57, direct LDL 102, non-HDL 119.  External Labs: Collected: 03/20/2022 provided by the patient. Total cholesterol 179, triglycerides 123, HDL 61, LDL direct 99, non-HDL 118    Cardiac Panel (last 3 results) No results for input(s): "CKTOTAL", "CKMB", "TROPONINIHS", "RELINDX" in the last 72 hours.  IMPRESSION:    ICD-10-CM   1. Moderate aortic stenosis  I35.0 EKG 12-Lead    ECHOCARDIOGRAM COMPLETE    Bempedoic Acid (NEXLETOL) 180 MG TABS    2. Asymptomatic bilateral carotid artery stenosis  I65.23 EKG 12-Lead    PCV CAROTID DUPLEX (BILATERAL)    Bempedoic Acid (NEXLETOL) 180 MG TABS    3. Hx of rheumatic fever  Z86.79     4. Essential hypertension  I10 metoprolol succinate (TOPROL XL) 50 MG 24 hr tablet    5. LBBB (left bundle branch block)  I44.7 EKG 12-Lead    6. Hypercholesterolemia  E78.00  Bempedoic Acid (NEXLETOL) 180 MG TABS    7. Former smoker  Z87.891     72. Class 1 obesity due to excess calories with serious comorbidity and body mass index (BMI) of 33.0 to 33.9 in adult  E66.09    Z68.33        RECOMMENDATIONS: Darlene Kelly is a 72 y.o. 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  Asymptomatic bilateral carotid artery stenosis Denies any vision changes or focal neurological deficits. Duplex February 2024 notes left ICA stenosis 50-69% per duplex criteria. In the past did not tolerate up titration of Crestor to 40 mg p.o. daily or addition of Zetia 10 mg p.o. daily due to myalgias. Will start Nexletol 180 mg p.o. daily.  3 weeks worth of samples provided. If she is able to tolerate the medication recommend fasting lipid profile in 6 weeks. If she is unable to tolerate Nexletol she is willing to consider PCSK9 inhibitors. Will await her response to the pharmacological therapy discussed above Patient is asked to be more cognizant with symptoms of vision changes predominantly left greater than right or focal neurological deficits and if present to go to the closest ER via EMS.  Moderate aortic stenosis Asymptomatic. Recommend 66-month follow-up study prior to the next office visit at [facility]. In the interim, if she has anginal chest pain, heart failure symptoms, near-syncope or syncopal event patient is asked to reach out sooner than her scheduled visit  Essential hypertension Office blood pressures are all very well-controlled. However, home blood pressures are soft Patient is requesting assistance. Reduce Toprol-XL  to 50 mg p.o. daily Continue to monitor blood  pressure readings and discuss further with PCP if needed  Hypercholesterolemia Currently on rosuvastatin.   Had myalgias with the higher dose of rosuvastatin (40 mg) as well as Zetia 10 mg p.o. daily. She had labs in December 2023 with her PCP-new provide records independently reviewed and noted above. No significant change in direct LDL. Cholesterol management as discussed above.  Class 1 obesity due to excess calories with serious comorbidity and body mass index (BMI) of 33.0 to 33.9 in adult Body mass index is 33.41 kg/m. I reviewed with the patient the importance of diet, regular physical activity/exercise, weight loss.   Patient is educated on increasing physical activity gradually as tolerated.  With the goal of moderate intensity exercise for 30 minutes a day 5 days a week.  Order hospital echo August 2024 Carotid duplex September 2024  FINAL MEDICATION LIST END OF ENCOUNTER: Meds ordered this encounter  Medications   metoprolol succinate (TOPROL XL) 50 MG 24 hr tablet    Sig: Take 1 tablet (50 mg total) by mouth daily. Take with or immediately following a meal. Hold if systolic blood pressure (top number) less than 100 mmHg or pulse less than 60 bpm.    Dispense:  90 tablet    Refill:  0   Bempedoic Acid (NEXLETOL) 180 MG TABS    Sig: Take 1 tablet (180 mg total) by mouth daily.    Dispense:  90 tablet    Refill:  0    Medications Discontinued During This Encounter  Medication Reason   metoprolol succinate (TOPROL-XL) 100 MG 24 hr tablet Dose change     Current Outpatient Medications:    ASPIRIN 81 PO, Take 1 tablet by mouth daily., Disp: , Rfl:    Bempedoic Acid (NEXLETOL) 180 MG TABS, Take 1 tablet (180 mg total) by mouth daily., Disp: 90 tablet, Rfl: 0   Cholecalciferol (VITAMIN D3) 25 MCG (1000 UT) CAPS, Take 1 capsule by mouth daily., Disp: , Rfl:    Coenzyme Q10 (CO Q 10 PO), Take 200 mg by mouth daily at 12 noon., Disp: , Rfl:    gabapentin (NEURONTIN) 300 MG  capsule, Take 1 capsule (300 mg total) by mouth 3 (three) times daily. Take a 300 mg capsule three times a day for two weeks Then a 300 mg capsule twice a day for two weeks Then a 300 mg capsule once a day for two weeks, Disp: 84 capsule, Rfl: 0   lisinopril (PRINIVIL,ZESTRIL) 10 MG tablet, Take 10 mg by mouth at bedtime. , Disp: , Rfl: 6   MAGNESIUM GLYCINATE PO, Take 240 mg by mouth in the morning and at bedtime., Disp: , Rfl:    Melatonin 10 MG TABS, Take 1 tablet by mouth daily as needed., Disp: , Rfl:    metoprolol succinate (TOPROL XL) 50 MG 24 hr tablet, Take 1 tablet (50 mg total) by mouth daily. Take with or immediately following a meal. Hold if systolic blood pressure (top number) less than 100 mmHg or pulse less than 60 bpm., Disp: 90 tablet, Rfl: 0   Multiple Vitamins-Minerals (CENTRUM SILVER ADULT 50+ PO), Take 1 tablet by mouth daily., Disp: , Rfl:    rosuvastatin (CRESTOR) 40 MG tablet, Take 0.5 tablets (20 mg total) by mouth at bedtime., Disp: , Rfl:    vitamin C (ASCORBIC ACID) 500 MG tablet, Take 500 mg by mouth daily., Disp: , Rfl:   Orders Placed This Encounter  Procedures  EKG 12-Lead   ECHOCARDIOGRAM COMPLETE   PCV CAROTID DUPLEX (BILATERAL)   --Continue cardiac medications as reconciled in final medication list. --Return in about 6 months (around 01/01/2023) for Follow up, Carotid disease and Aortic stenosis . Or sooner if needed. --Continue follow-up with your primary care physician regarding the management of your other chronic comorbid conditions.  Patient's questions and concerns were addressed to her satisfaction. She voices understanding of the instructions provided during this encounter.   This note was created using a voice recognition software as a result there may be grammatical errors inadvertently enclosed that do not reflect the nature of this encounter. Every attempt is made to correct such errors.  Rex Kras, [place], [facility]  Pager:  [phone] Office:  [phone]

## 2022-07-02 ENCOUNTER — Other Ambulatory Visit: Payer: Self-pay

## 2022-07-02 ENCOUNTER — Telehealth: Payer: Self-pay

## 2022-07-02 NOTE — Telephone Encounter (Signed)
Patient came into the office with concerns of some symptoms she feels is related to Nexlitol (tiredness along with chest and arm heaviness/aches). She is wanting to try rosuvastatin 40 mg daily instead. After discussing with Dr. Terri Skains, he does not think these symptoms are from the [place] but is ok with that patient switching to Rosuvastatin instead. Reviewed this information with the patient and she decided to discontinue nexlitol and go with the rosuvastatin. Medication list is updated and Nexlitol is discontinued.

## 2022-08-08 ENCOUNTER — Other Ambulatory Visit: Payer: Self-pay | Admitting: Physician Assistant

## 2022-08-08 DIAGNOSIS — Z1231 Encounter for screening mammogram for malignant neoplasm of breast: Secondary | ICD-10-CM

## 2022-08-10 ENCOUNTER — Other Ambulatory Visit: Payer: Self-pay | Admitting: Family Medicine

## 2022-08-10 DIAGNOSIS — Z780 Asymptomatic menopausal state: Secondary | ICD-10-CM

## 2022-09-03 ENCOUNTER — Ambulatory Visit
Admission: RE | Admit: 2022-09-03 | Discharge: 2022-09-03 | Disposition: A | Discharge status: Home / Self Care | Payer: Medicare Other | Source: Ambulatory Visit | Attending: Physician Assistant | Admitting: Physician Assistant

## 2022-09-03 DIAGNOSIS — Z1231 Encounter for screening mammogram for malignant neoplasm of breast: Secondary | ICD-10-CM

## 2022-10-08 ENCOUNTER — Ambulatory Visit
Admission: RE | Admit: 2022-10-08 | Discharge: 2022-10-08 | Disposition: A | Discharge status: Home / Self Care | Payer: Medicare Other | Source: Ambulatory Visit | Attending: Family Medicine | Admitting: Family Medicine

## 2022-10-08 DIAGNOSIS — Z780 Asymptomatic menopausal state: Secondary | ICD-10-CM

## 2022-12-12 ENCOUNTER — Ambulatory Visit: Payer: Medicare Other

## 2022-12-12 DIAGNOSIS — I6523 Occlusion and stenosis of bilateral carotid arteries: Secondary | ICD-10-CM

## 2022-12-18 ENCOUNTER — Other Ambulatory Visit: Payer: Self-pay | Admitting: Cardiology

## 2022-12-18 DIAGNOSIS — I6523 Occlusion and stenosis of bilateral carotid arteries: Secondary | ICD-10-CM

## 2022-12-18 NOTE — Progress Notes (Signed)
Called and spoke with patient regarding her CAD results. Patient aware to follow up in one year.

## 2022-12-19 ENCOUNTER — Other Ambulatory Visit: Payer: PRIVATE HEALTH INSURANCE

## 2022-12-19 ENCOUNTER — Ambulatory Visit (HOSPITAL_COMMUNITY)
Admission: RE | Admit: 2022-12-19 | Discharge: 2022-12-19 | Disposition: A | Discharge status: Home / Self Care | Payer: Medicare Other | Source: Ambulatory Visit | Attending: Cardiology | Admitting: Cardiology

## 2022-12-19 DIAGNOSIS — Z923 Personal history of irradiation: Secondary | ICD-10-CM | POA: Diagnosis not present

## 2022-12-19 DIAGNOSIS — E785 Hyperlipidemia, unspecified: Secondary | ICD-10-CM | POA: Diagnosis not present

## 2022-12-19 DIAGNOSIS — I350 Nonrheumatic aortic (valve) stenosis: Secondary | ICD-10-CM

## 2022-12-19 DIAGNOSIS — I080 Rheumatic disorders of both mitral and aortic valves: Secondary | ICD-10-CM | POA: Insufficient documentation

## 2022-12-19 DIAGNOSIS — E119 Type 2 diabetes mellitus without complications: Secondary | ICD-10-CM | POA: Insufficient documentation

## 2022-12-19 DIAGNOSIS — I10 Essential (primary) hypertension: Secondary | ICD-10-CM | POA: Diagnosis not present

## 2022-12-24 LAB — ECHOCARDIOGRAM COMPLETE
AR max vel: 0.67 cm2
AV Area VTI: 0.66 cm2
AV Area mean vel: 0.75 cm2
AV Mean grad: 38 mmHg
AV Peak grad: 70.6 mmHg
Ao pk vel: 4.2 m/s
Area-P 1/2: 1.81 cm2
MV VTI: 1.85 cm2
S' Lateral: 2.9 cm

## 2022-12-28 ENCOUNTER — Ambulatory Visit: Payer: PRIVATE HEALTH INSURANCE | Admitting: Cardiology

## 2023-01-08 NOTE — Progress Notes (Unsigned)
Cardiology Office Note:  .   Date:  01/09/2023  ID:  Darlene Kelly, DOB Aug 21, 1950, MRN [id number] PCP: Trisha Mangle, FNP  [facility] Providers Cardiologist:  None {  History of Present Illness: .   Darlene Kelly is a 72 y.o. female who has past medical history includes mild to moderate valvular heart disease, (aortic stenosis), hypertension, hyperlipidemia, history of right-sided breast cancer status post chemo/radiation/surgery, bilateral carotid artery atherosclerosis, former smoker, history of rheumatic fever, postmenopausal here for follow-up appointment.  Followed by practice for management of carotid artery disease and aortic stenosis.  Neck ultrasound for her carotids February 2024 showed stable disease.  Left ICA stenosis 50 to 69%.  Uptitrated Crestor to 40 mg p.o. but developed myalgias.  Also tried to add Zetia 10 mg daily but 3 weeks later started to notice myalgias as well.  Clinically, denied any focal neurologic deficits or vision changes at her last appointment in March.  Her aortic stenosis was moderate August 2023 but most recent echocardiogram 12/24/2018 4 aortic stenosis now moderate to severe.   Today, she tells me that she is felt like she has had good days and bad days.  She does try to walk a mile and a half in the morning but experiences some tightness in her chest at the beginning of her walk.  It usually does subside.  Unfortunately, she is tired all the time.  A little bit of lightheadedness but no passing out.  We went over her most recent echo and her aortic stenosis has progressed from moderate (6 months ago) to moderate to severe.  AVA is now 0.6. We will reach out to the TAVR team to arrange consultation.   Reports no shortness of breath nor dyspnea on exertion. Reports no chest pain, pressure, or tightness. No edema, orthopnea, PND. Reports no palpitations.   ROS: Pertinent ROS in HPI  Studies Reviewed: .         Echocardiogram: 10/07/2019: LVEF 55 to 60%, moderate LVH, indeterminate diastolic filling pattern, elevated LAP. Moderate aortic stenosis (peak velocity 3.40m/s, MG , AVA 0.9cm2, DI 0.3).      05/19/2020: LVEF 55%, mild LVH, normal wall motion, grade 1 diastolic impairment, normal LAP, moderately dilated left atrium, posterior MAC, mild to moderate MR, mild TR, mild aortic regurgitation, moderate aortic stenosis (Vmax 3.2 m/sec, mean PG 26 mmHg, AVA 0.8 cm2 by continuity equation. DVI of 0.3).    Stress Testing:  Lexiscan Myoview Stress Test 01/12/2019: Stress EKG is non-diagnostic, as this is a pharmacological stress test. In addition, Rest and stress EKG show sinus rhythm, LBBB.  SPECT images reveal basal to apical inferolateral, basal inferior, basal inferoseptal, seen both on stress and rest images. While tissue attenuation is present, ischemia in this region cannot be completely excluded.  Stress LVEF is 73%. Low risk study. No prior studies for comparison.   Heart Catheterization: None   Carotid artery duplex 05/18/2021:  Duplex suggests stenosis in the right internal carotid artery (1-15%).  Right CCA bifurcation with severe calcific plaque with <50% stenosis.  Duplex suggests stenosis in the left internal carotid artery (50-69%).  Antegrade right vertebral artery flow.  Compared to the study done on 11/02/2020, right ICA stenosis of 50 to 69% not demonstrated in the present study. Follow up in six months is  appropriate if clinically indicated.           Physical Exam:   VS:  BP 124/78   Pulse 95   Ht  5\' 1"  (1.549 m)   Wt 155 lb (70.3 kg)   SpO2 97%   BMI 29.29 kg/m    Wt Readings from Last 3 Encounters:  01/09/23 155 lb (70.3 kg)  06/18/22 176 lb 12.8 oz (80.2 kg)  12/18/21 171 lb (77.6 kg)    GEN: Well nourished, well developed in no acute distress NECK: No JVD; + bilateral carotid bruits CARDIAC: RRR, 4+ systolic murmur, rubs, gallops RESPIRATORY:  Clear to  auscultation without rales, wheezing or rhonchi  ABDOMEN: Soft, non-tender, non-distended EXTREMITIES:  No edema; No deformity   ASSESSMENT AND PLAN: .   1.  Severe aortic stenosis -AVA 0.6 -Patient symptomatic with lightheadedness, chest tightness, and fatigue -We have reached out to the TAVR team to get her in for consultation  2.  Hypertension -she weaned herself off metoprolol and lisinopril since she was getting blood pressures of 80 systolic and felt terrible -Heart rate is a little high today in the 90s but she states she gets whitecoat syndrome -Blood pressure well-controlled currently -Continue current regimen  3.  LBBB -stable  5.  Former smoker -40 years ago  6. Bilateral carotid artery stenosis/HLD   -February 2024 noted left ICA 50 to 69%. -She is now on Crestor 20 mg twice daily and we discussed adding Zetia -She is due for repeat lipid panel in December, LDL goal less than 70    Dispo: consult to structural heart for TAVR workup  Signed, Sharlene Dory, PA-C

## 2023-01-09 ENCOUNTER — Encounter: Payer: Self-pay | Admitting: Physician Assistant

## 2023-01-09 ENCOUNTER — Ambulatory Visit: Payer: Medicare Other | Attending: Cardiology | Admitting: Physician Assistant

## 2023-01-09 VITALS — BP 124/78 | HR 95 | Ht 61.0 in | Wt 155.0 lb

## 2023-01-09 DIAGNOSIS — E66811 Obesity, class 1: Secondary | ICD-10-CM | POA: Diagnosis present

## 2023-01-09 DIAGNOSIS — Z87891 Personal history of nicotine dependence: Secondary | ICD-10-CM | POA: Diagnosis present

## 2023-01-09 DIAGNOSIS — I447 Left bundle-branch block, unspecified: Secondary | ICD-10-CM | POA: Diagnosis not present

## 2023-01-09 DIAGNOSIS — E7800 Pure hypercholesterolemia, unspecified: Secondary | ICD-10-CM | POA: Diagnosis present

## 2023-01-09 DIAGNOSIS — E6609 Other obesity due to excess calories: Secondary | ICD-10-CM

## 2023-01-09 DIAGNOSIS — I6523 Occlusion and stenosis of bilateral carotid arteries: Secondary | ICD-10-CM | POA: Diagnosis present

## 2023-01-09 DIAGNOSIS — Z6833 Body mass index (BMI) 33.0-33.9, adult: Secondary | ICD-10-CM | POA: Diagnosis present

## 2023-01-09 DIAGNOSIS — Z8679 Personal history of other diseases of the circulatory system: Secondary | ICD-10-CM

## 2023-01-09 DIAGNOSIS — I10 Essential (primary) hypertension: Secondary | ICD-10-CM | POA: Diagnosis not present

## 2023-01-09 DIAGNOSIS — I350 Nonrheumatic aortic (valve) stenosis: Secondary | ICD-10-CM

## 2023-01-09 NOTE — Patient Instructions (Signed)
Medication Instructions:  Your physician recommends that you continue on your current medications as directed. Please refer to the Current Medication list given to you today. *If you need a refill on your cardiac medications before your next appointment, please call your pharmacy*   Lab Work: None ordered   Testing/Procedures: None ordered   Follow-Up: At [facility], you and your health needs are our priority.  As part of our continuing mission to provide you with exceptional heart care, we have created designated Provider Care Teams.  These Care Teams include your primary Cardiologist (physician) and Advanced Practice Providers (APPs -  Physician Assistants and Nurse Practitioners) who all work together to provide you with the care you need, when you need it.  We recommend signing up for the patient portal called "MyChart".  Sign up information is provided on this After Visit Summary.  MyChart is used to connect with patients for Virtual Visits (Telemedicine).  Patients are able to view lab/test results, encounter notes, upcoming appointments, etc.  Non-urgent messages can be sent to your provider as well.   To learn more about what you can do with MyChart, go to [email or web address].    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  Make sure to check 2 hours after your medications.   AVOID these things for 30 minutes before checking your blood pressure: No Drinking caffeine. No Drinking alcohol. No Eating. No Smoking. No Exercising.  Five minutes before checking your blood pressure: Pee. Sit in a dining chair. Avoid sitting in a soft couch or armchair. Be quiet. Do not talk.

## 2023-01-11 ENCOUNTER — Ambulatory Visit: Payer: Medicare Other | Attending: Cardiovascular Disease | Admitting: Cardiovascular Disease

## 2023-01-11 ENCOUNTER — Telehealth: Payer: Self-pay | Admitting: Physician Assistant

## 2023-01-11 ENCOUNTER — Encounter: Payer: Self-pay | Admitting: Cardiovascular Disease

## 2023-01-11 VITALS — BP 124/84 | HR 97 | Ht 61.0 in | Wt 156.8 lb

## 2023-01-11 DIAGNOSIS — Z01810 Encounter for preprocedural cardiovascular examination: Secondary | ICD-10-CM

## 2023-01-11 DIAGNOSIS — I350 Nonrheumatic aortic (valve) stenosis: Secondary | ICD-10-CM

## 2023-01-11 NOTE — Patient Instructions (Addendum)
Medication Instructions:  Your physician recommends that you continue on your current medications as directed. Please refer to the Current Medication list given to you today.  *If you need a refill on your cardiac medications before your next appointment, please call your pharmacy*  Lab Work: CBC, BMET today If you have labs (blood work) drawn today and your tests are completely normal, you will receive your results only by: MyChart Message (if you have MyChart) OR A paper copy in the mail If you have any lab test that is abnormal or we need to change your treatment, we will call you to review the results.  Testing/Procedures: R & L heart catheterization Your physician has requested that you have a cardiac catheterization. Cardiac catheterization is used to diagnose and/or treat various heart conditions. Doctors may recommend this procedure for a number of different reasons. The most common reason is to evaluate chest pain. Chest pain can be a symptom of coronary artery disease (CAD), and cardiac catheterization can show whether plaque is narrowing or blocking your heart's arteries. This procedure is also used to evaluate the valves, as well as measure the blood flow and oxygen levels in different parts of your heart. For further information please visit [email or web address] Please follow instruction sheet, as given.  TAVR CT's and surgical consult (you will be called to schedule)  Follow-Up: At [facility], you and your health needs are our priority.  As part of our continuing mission to provide you with exceptional heart care, we have created designated Provider Care Teams.  These Care Teams include your primary Cardiologist (physician) and Advanced Practice Providers (APPs -  Physician Assistants and Nurse Practitioners) who all work together to provide you with the care you need, when you need it.  We recommend signing up for the patient portal called "MyChart".  Sign up  information is provided on this After Visit Summary.  MyChart is used to connect with patients for Virtual Visits (Telemedicine).  Patients are able to view lab/test results, encounter notes, upcoming appointments, etc.  Non-urgent messages can be sent to your provider as well.   To learn more about what you can do with MyChart, go to [email or web address].    Your next appointment:   Structural Team will follow-up  Provider:   Tonny Bollman, MD      Other Instructions       Cardiac/Peripheral Catheterization   You are scheduled for a Cardiac Catheterization on Tuesday, October 15 with Dr. Tonny Bollman.  1. Please arrive at the [facility] (Main Entrance A) at [facility]: [address] [place], [place] [address] at 6:00 AM (This time is two hour(s) before your procedure to ensure your preparation). Free valet parking service is available. You will check in at ADMITTING. The support person will be asked to wait in the waiting room.  It is OK to have someone drop you off and come back when you are ready to be discharged.        Special note: Every effort is made to have your procedure done on time. Please understand that emergencies sometimes delay scheduled procedures.  2. Diet: Do not eat solid foods after midnight.  You may have clear liquids until 5 AM the day of the procedure.  3. Labs: CBC, BMET today  4. Medication instructions in preparation for your procedure:   Contrast Allergy: No  DO NOT TAKE METFORMIN the day of procedure and HOLD for 2 days afterwards  On the morning of  your procedure, take Aspirin 81 mg and any morning medicines NOT listed above.  You may use sips of water.  5. Plan to go home the same day, you will only stay overnight if medically necessary. 6. You MUST have a responsible adult to drive you home. 7. An adult MUST be with you the first 24 hours after you arrive home. 8. Bring a current list of your medications, and the last time and  date medication taken. 9. Bring ID and current insurance cards. 10.Please wear clothes that are easy to get on and off and wear slip-on shoes.  Thank you for allowing [place] to care for you!   -- [facility]

## 2023-01-11 NOTE — Progress Notes (Signed)
Cardiology Office Note:    Date:  01/11/2023   ID:  Darlene Kelly, DOB March 14, 1951, MRN [id number]  PCP:  Trisha Mangle, FNP   [facility] Providers Cardiologist:  None     Referring MD: Trisha Mangle, *   Chief Complaint  Patient presents with   Aortic Stenosis    History of Present Illness:    Darlene Kelly is a 72 y.o. female referred for evaluation of severe aortic stenosis. The patient has been followed by Dr Odis Hollingshead for moderate aortic stenosis in the past.  However, she has now been noted to have symptoms of fatigue and shortness of breath.  A recent echocardiogram showed normal LVEF of 60 to 65%, grade 2 diastolic dysfunction, normal RV function, and aortic stenosis with calcified restricted leaflets, peak velocity of 4.2 m/s, peak and mean gradients of 71 and 38 mmHg, dimensionless index of 0.26, and calculated aortic valve area of 0.66 cm.  In addition, her stroke-volume index was noted to be low at 31.  The patient's cardiovascular comorbid conditions include hypertension, mixed hyperlipidemia, former smoker, and bilateral carotid artery atherosclerosis.  The patient is here with her husband today. She has longstanding shortness of breath with exertion, but it has progressed significantly over the past 3 months. She has it with house work and chores. She also has chest tightness radiating down both arms. Typically, if she keeps walking, her symptoms improve. Symptoms worsen when going up hill. Occasionally she has mild pains and flutters when she is lying in bed. She has periodic lightheadedness. Yesterday she had near-syncope but this was unrelated to any physical exertion.   She was first told of having a heart murmur many years ago. She had rheumatic fever as a child. Her mother had CAD, but there is no family hx of valvular heart disease. Her oldest brother had multivessel CABG.    Current Medications: Current Meds  Medication Sig   ASPIRIN  81 PO Take 1 tablet by mouth daily.   Calcium Carbonate (CALCIUM 500 PO) Take 1 tablet by mouth 2 (two) times daily.   Cholecalciferol (VITAMIN D3) 25 MCG (1000 UT) CAPS Take 1 capsule by mouth daily.   Coenzyme Q10 (CO Q 10 PO) Take 200 mg by mouth daily at 12 noon.   Cyanocobalamin (B-12) 3000 MCG CAPS Take 1 capsule by mouth daily.   gabapentin (NEURONTIN) 300 MG capsule Take 1 capsule (300 mg total) by mouth 3 (three) times daily. Take a 300 mg capsule three times a day for two weeks Then a 300 mg capsule twice a day for two weeks Then a 300 mg capsule once a day for two weeks (Patient taking differently: Take 300 mg by mouth at bedtime. 1 by mouth in the evening)   MAGNESIUM GLYCINATE PO Take 80 mg by mouth in the morning and at bedtime.   Melatonin 10 MG TABS Take 1 tablet by mouth daily as needed.   metFORMIN (GLUCOPHAGE-XR) 500 MG 24 hr tablet Take 2 tablets by mouth daily with breakfast.   OZEMPIC, 0.25 OR 0.5 MG/DOSE, 2 MG/3ML SOPN Inject 0.5 mg into the skin once a week.   rosuvastatin (CRESTOR) 20 MG tablet Take 20 mg by mouth 2 (two) times daily.   vitamin C (ASCORBIC ACID) 500 MG tablet Take 500 mg by mouth daily.   Zinc Gluconate ([employer] ZINC PO) Take 1 tablet by mouth daily.     Allergies:   Latex and Lipitor [atorvastatin]   ROS:  Please see the history of present illness.     All other systems reviewed and are negative.  EKGs/Labs/Other Studies Reviewed:    The following studies were reviewed today: Cardiac Studies & Procedures       ECHOCARDIOGRAM  ECHOCARDIOGRAM COMPLETE 12/19/2022  Narrative ECHOCARDIOGRAM REPORT    Patient Name:   Darlene Kelly Date of Exam: 12/19/2022 Medical Rec #:  [id number]       Height:       61.0 in Accession #:    [id number]      Weight:       176.8 lb Date of Birth:  1951-01-03       BSA:          1.792 m Patient Age:    72 years        BP:           153/90 mmHg Patient Gender: F               HR:           88 bpm. Exam Location:   Outpatient  Procedure: 2D Echo, Color Doppler and Cardiac Doppler  Indications:     Aortic Stenosis i35.0  History:         Patient has prior history of Echocardiogram examinations, most recent 11/24/2021. Risk Factors:Hypertension, Diabetes and Dyslipidemia. History of chest radiation.  Sonographer:     Irving Burton Senior RDCS Referring Phys:  [id number] Tessa Lerner Diagnosing Phys: Tessa Lerner DO  IMPRESSIONS   1. Left ventricular diastolic function could not be evaluated due to mitral annular calcification (but suggestive of Grade 2 diastolic dysfunction). Left ventricular ejection fraction, by estimation, is 60 to 65%. The left ventricle has normal function. The left ventricle has no regional wall motion abnormalities. Elevated left atrial pressure. The E/e' is 21. 2. Right ventricular systolic function is normal. The right ventricular size is normal. There is normal pulmonary artery systolic pressure. 3. Left atrial size was mildly dilated. 4. The mitral valve is degenerative. Mild mitral valve regurgitation. No evidence of mitral stenosis. 5. Native trileaflet valve, calcified leaflet / annulus, trivial AR, moderate / severe AS (peak velocity 4.49m/s, PG 71 mmHG, MG , DI 0.26, AVA VTI 0.66cm2). 6. The inferior vena cava is normal in size with greater than 50% respiratory variability, suggesting right atrial pressure of 3 mmHg. 7. Rhythm strip during this exam demonstrates normal sinus rhythm.  Comparison(s): Prior study 11/23/2021: LVEF 60-65%, Grade 2 Diastolic dysfunction, elevated LAP, moderate LAE, moderate AS (peak velocity 3.71m/s, MG 21 mmHg, DI 0.3).  FINDINGS Left Ventricle: Left ventricular diastolic function could not be evaluated due to mitral annular calcification (but suggestive of Grade 2 diastolic dysfunction). Left ventricular ejection fraction, by estimation, is 60 to 65%. The left ventricle has normal function. The left ventricle has no regional wall motion  abnormalities. The left ventricular internal cavity size was normal in size. There is no left ventricular hypertrophy. Left ventricular diastolic function could not be evaluated due to mitral annular calcification (moderate or greater). Elevated left atrial pressure. The E/e' is 21.  Right Ventricle: The right ventricular size is normal. No increase in right ventricular wall thickness. Right ventricular systolic function is normal. There is normal pulmonary artery systolic pressure. The tricuspid regurgitant velocity is 2.81 m/s, and with an assumed right atrial pressure of 3 mmHg, the estimated right ventricular systolic pressure is 34.6 mmHg.  Left Atrium: Left atrial size was mildly dilated.  Right Atrium: Right atrial size  Cardiology Office Note:    Date:  01/11/2023   ID:  Darlene Kelly, DOB March 14, 1951, MRN [id number]  PCP:  Trisha Mangle, FNP   [facility] Providers Cardiologist:  None     Referring MD: Trisha Mangle, *   Chief Complaint  Patient presents with   Aortic Stenosis    History of Present Illness:    Darlene Kelly is a 72 y.o. female referred for evaluation of severe aortic stenosis. The patient has been followed by Dr Odis Hollingshead for moderate aortic stenosis in the past.  However, she has now been noted to have symptoms of fatigue and shortness of breath.  A recent echocardiogram showed normal LVEF of 60 to 65%, grade 2 diastolic dysfunction, normal RV function, and aortic stenosis with calcified restricted leaflets, peak velocity of 4.2 m/s, peak and mean gradients of 71 and 38 mmHg, dimensionless index of 0.26, and calculated aortic valve area of 0.66 cm.  In addition, her stroke-volume index was noted to be low at 31.  The patient's cardiovascular comorbid conditions include hypertension, mixed hyperlipidemia, former smoker, and bilateral carotid artery atherosclerosis.  The patient is here with her husband today. She has longstanding shortness of breath with exertion, but it has progressed significantly over the past 3 months. She has it with house work and chores. She also has chest tightness radiating down both arms. Typically, if she keeps walking, her symptoms improve. Symptoms worsen when going up hill. Occasionally she has mild pains and flutters when she is lying in bed. She has periodic lightheadedness. Yesterday she had near-syncope but this was unrelated to any physical exertion.   She was first told of having a heart murmur many years ago. She had rheumatic fever as a child. Her mother had CAD, but there is no family hx of valvular heart disease. Her oldest brother had multivessel CABG.    Current Medications: Current Meds  Medication Sig   ASPIRIN  81 PO Take 1 tablet by mouth daily.   Calcium Carbonate (CALCIUM 500 PO) Take 1 tablet by mouth 2 (two) times daily.   Cholecalciferol (VITAMIN D3) 25 MCG (1000 UT) CAPS Take 1 capsule by mouth daily.   Coenzyme Q10 (CO Q 10 PO) Take 200 mg by mouth daily at 12 noon.   Cyanocobalamin (B-12) 3000 MCG CAPS Take 1 capsule by mouth daily.   gabapentin (NEURONTIN) 300 MG capsule Take 1 capsule (300 mg total) by mouth 3 (three) times daily. Take a 300 mg capsule three times a day for two weeks Then a 300 mg capsule twice a day for two weeks Then a 300 mg capsule once a day for two weeks (Patient taking differently: Take 300 mg by mouth at bedtime. 1 by mouth in the evening)   MAGNESIUM GLYCINATE PO Take 80 mg by mouth in the morning and at bedtime.   Melatonin 10 MG TABS Take 1 tablet by mouth daily as needed.   metFORMIN (GLUCOPHAGE-XR) 500 MG 24 hr tablet Take 2 tablets by mouth daily with breakfast.   OZEMPIC, 0.25 OR 0.5 MG/DOSE, 2 MG/3ML SOPN Inject 0.5 mg into the skin once a week.   rosuvastatin (CRESTOR) 20 MG tablet Take 20 mg by mouth 2 (two) times daily.   vitamin C (ASCORBIC ACID) 500 MG tablet Take 500 mg by mouth daily.   Zinc Gluconate ([employer] ZINC PO) Take 1 tablet by mouth daily.     Allergies:   Latex and Lipitor [atorvastatin]   ROS:  Cardiology Office Note:    Date:  01/11/2023   ID:  Darlene Kelly, DOB March 14, 1951, MRN [id number]  PCP:  Trisha Mangle, FNP   [facility] Providers Cardiologist:  None     Referring MD: Trisha Mangle, *   Chief Complaint  Patient presents with   Aortic Stenosis    History of Present Illness:    Darlene Kelly is a 72 y.o. female referred for evaluation of severe aortic stenosis. The patient has been followed by Dr Odis Hollingshead for moderate aortic stenosis in the past.  However, she has now been noted to have symptoms of fatigue and shortness of breath.  A recent echocardiogram showed normal LVEF of 60 to 65%, grade 2 diastolic dysfunction, normal RV function, and aortic stenosis with calcified restricted leaflets, peak velocity of 4.2 m/s, peak and mean gradients of 71 and 38 mmHg, dimensionless index of 0.26, and calculated aortic valve area of 0.66 cm.  In addition, her stroke-volume index was noted to be low at 31.  The patient's cardiovascular comorbid conditions include hypertension, mixed hyperlipidemia, former smoker, and bilateral carotid artery atherosclerosis.  The patient is here with her husband today. She has longstanding shortness of breath with exertion, but it has progressed significantly over the past 3 months. She has it with house work and chores. She also has chest tightness radiating down both arms. Typically, if she keeps walking, her symptoms improve. Symptoms worsen when going up hill. Occasionally she has mild pains and flutters when she is lying in bed. She has periodic lightheadedness. Yesterday she had near-syncope but this was unrelated to any physical exertion.   She was first told of having a heart murmur many years ago. She had rheumatic fever as a child. Her mother had CAD, but there is no family hx of valvular heart disease. Her oldest brother had multivessel CABG.    Current Medications: Current Meds  Medication Sig   ASPIRIN  81 PO Take 1 tablet by mouth daily.   Calcium Carbonate (CALCIUM 500 PO) Take 1 tablet by mouth 2 (two) times daily.   Cholecalciferol (VITAMIN D3) 25 MCG (1000 UT) CAPS Take 1 capsule by mouth daily.   Coenzyme Q10 (CO Q 10 PO) Take 200 mg by mouth daily at 12 noon.   Cyanocobalamin (B-12) 3000 MCG CAPS Take 1 capsule by mouth daily.   gabapentin (NEURONTIN) 300 MG capsule Take 1 capsule (300 mg total) by mouth 3 (three) times daily. Take a 300 mg capsule three times a day for two weeks Then a 300 mg capsule twice a day for two weeks Then a 300 mg capsule once a day for two weeks (Patient taking differently: Take 300 mg by mouth at bedtime. 1 by mouth in the evening)   MAGNESIUM GLYCINATE PO Take 80 mg by mouth in the morning and at bedtime.   Melatonin 10 MG TABS Take 1 tablet by mouth daily as needed.   metFORMIN (GLUCOPHAGE-XR) 500 MG 24 hr tablet Take 2 tablets by mouth daily with breakfast.   OZEMPIC, 0.25 OR 0.5 MG/DOSE, 2 MG/3ML SOPN Inject 0.5 mg into the skin once a week.   rosuvastatin (CRESTOR) 20 MG tablet Take 20 mg by mouth 2 (two) times daily.   vitamin C (ASCORBIC ACID) 500 MG tablet Take 500 mg by mouth daily.   Zinc Gluconate ([employer]S ZINC PO) Take 1 tablet by mouth daily.     Allergies:   Latex and Lipitor [atorvastatin]   ROS:  Please see the history of present illness.     All other systems reviewed and are negative.  EKGs/Labs/Other Studies Reviewed:    The following studies were reviewed today: Cardiac Studies & Procedures       ECHOCARDIOGRAM  ECHOCARDIOGRAM COMPLETE 12/19/2022  Narrative ECHOCARDIOGRAM REPORT    Patient Name:   Darlene Kelly Date of Exam: 12/19/2022 Medical Rec #:  [id number]       Height:       61.0 in Accession #:    [id number]      Weight:       176.8 lb Date of Birth:  1951-01-03       BSA:          1.792 m Patient Age:    72 years        BP:           153/90 mmHg Patient Gender: F               HR:           88 bpm. Exam Location:   Outpatient  Procedure: 2D Echo, Color Doppler and Cardiac Doppler  Indications:     Aortic Stenosis i35.0  History:         Patient has prior history of Echocardiogram examinations, most recent 11/24/2021. Risk Factors:Hypertension, Diabetes and Dyslipidemia. History of chest radiation.  Sonographer:     Irving Burton Senior RDCS Referring Phys:  [id number] Tessa Lerner Diagnosing Phys: Tessa Lerner DO  IMPRESSIONS   1. Left ventricular diastolic function could not be evaluated due to mitral annular calcification (but suggestive of Grade 2 diastolic dysfunction). Left ventricular ejection fraction, by estimation, is 60 to 65%. The left ventricle has normal function. The left ventricle has no regional wall motion abnormalities. Elevated left atrial pressure. The E/e' is 21. 2. Right ventricular systolic function is normal. The right ventricular size is normal. There is normal pulmonary artery systolic pressure. 3. Left atrial size was mildly dilated. 4. The mitral valve is degenerative. Mild mitral valve regurgitation. No evidence of mitral stenosis. 5. Native trileaflet valve, calcified leaflet / annulus, trivial AR, moderate / severe AS (peak velocity 4.49m/s, PG 71 mmHG, MG , DI 0.26, AVA VTI 0.66cm2). 6. The inferior vena cava is normal in size with greater than 50% respiratory variability, suggesting right atrial pressure of 3 mmHg. 7. Rhythm strip during this exam demonstrates normal sinus rhythm.  Comparison(s): Prior study 11/23/2021: LVEF 60-65%, Grade 2 Diastolic dysfunction, elevated LAP, moderate LAE, moderate AS (peak velocity 3.71m/s, MG 21 mmHg, DI 0.3).  FINDINGS Left Ventricle: Left ventricular diastolic function could not be evaluated due to mitral annular calcification (but suggestive of Grade 2 diastolic dysfunction). Left ventricular ejection fraction, by estimation, is 60 to 65%. The left ventricle has normal function. The left ventricle has no regional wall motion  abnormalities. The left ventricular internal cavity size was normal in size. There is no left ventricular hypertrophy. Left ventricular diastolic function could not be evaluated due to mitral annular calcification (moderate or greater). Elevated left atrial pressure. The E/e' is 21.  Right Ventricle: The right ventricular size is normal. No increase in right ventricular wall thickness. Right ventricular systolic function is normal. There is normal pulmonary artery systolic pressure. The tricuspid regurgitant velocity is 2.81 m/s, and with an assumed right atrial pressure of 3 mmHg, the estimated right ventricular systolic pressure is 34.6 mmHg.  Left Atrium: Left atrial size was mildly dilated.  Right Atrium: Right atrial size  Cardiology Office Note:    Date:  01/11/2023   ID:  Darlene Kelly, DOB March 14, 1951, MRN [id number]  PCP:  Trisha Mangle, FNP   [facility] Providers Cardiologist:  None     Referring MD: Trisha Mangle, *   Chief Complaint  Patient presents with   Aortic Stenosis    History of Present Illness:    Darlene Kelly is a 72 y.o. female referred for evaluation of severe aortic stenosis. The patient has been followed by Dr Odis Hollingshead for moderate aortic stenosis in the past.  However, she has now been noted to have symptoms of fatigue and shortness of breath.  A recent echocardiogram showed normal LVEF of 60 to 65%, grade 2 diastolic dysfunction, normal RV function, and aortic stenosis with calcified restricted leaflets, peak velocity of 4.2 m/s, peak and mean gradients of 71 and 38 mmHg, dimensionless index of 0.26, and calculated aortic valve area of 0.66 cm.  In addition, her stroke-volume index was noted to be low at 31.  The patient's cardiovascular comorbid conditions include hypertension, mixed hyperlipidemia, former smoker, and bilateral carotid artery atherosclerosis.  The patient is here with her husband today. She has longstanding shortness of breath with exertion, but it has progressed significantly over the past 3 months. She has it with house work and chores. She also has chest tightness radiating down both arms. Typically, if she keeps walking, her symptoms improve. Symptoms worsen when going up hill. Occasionally she has mild pains and flutters when she is lying in bed. She has periodic lightheadedness. Yesterday she had near-syncope but this was unrelated to any physical exertion.   She was first told of having a heart murmur many years ago. She had rheumatic fever as a child. Her mother had CAD, but there is no family hx of valvular heart disease. Her oldest brother had multivessel CABG.    Current Medications: Current Meds  Medication Sig   ASPIRIN  81 PO Take 1 tablet by mouth daily.   Calcium Carbonate (CALCIUM 500 PO) Take 1 tablet by mouth 2 (two) times daily.   Cholecalciferol (VITAMIN D3) 25 MCG (1000 UT) CAPS Take 1 capsule by mouth daily.   Coenzyme Q10 (CO Q 10 PO) Take 200 mg by mouth daily at 12 noon.   Cyanocobalamin (B-12) 3000 MCG CAPS Take 1 capsule by mouth daily.   gabapentin (NEURONTIN) 300 MG capsule Take 1 capsule (300 mg total) by mouth 3 (three) times daily. Take a 300 mg capsule three times a day for two weeks Then a 300 mg capsule twice a day for two weeks Then a 300 mg capsule once a day for two weeks (Patient taking differently: Take 300 mg by mouth at bedtime. 1 by mouth in the evening)   MAGNESIUM GLYCINATE PO Take 80 mg by mouth in the morning and at bedtime.   Melatonin 10 MG TABS Take 1 tablet by mouth daily as needed.   metFORMIN (GLUCOPHAGE-XR) 500 MG 24 hr tablet Take 2 tablets by mouth daily with breakfast.   OZEMPIC, 0.25 OR 0.5 MG/DOSE, 2 MG/3ML SOPN Inject 0.5 mg into the skin once a week.   rosuvastatin (CRESTOR) 20 MG tablet Take 20 mg by mouth 2 (two) times daily.   vitamin C (ASCORBIC ACID) 500 MG tablet Take 500 mg by mouth daily.   Zinc Gluconate ([employer] ZINC PO) Take 1 tablet by mouth daily.     Allergies:   Latex and Lipitor [atorvastatin]   ROS:

## 2023-01-11 NOTE — H&P (View-Only) (Signed)
Please see the history of present illness.     All other systems reviewed and are negative.  EKGs/Labs/Other Studies Reviewed:    The following studies were reviewed today: Cardiac Studies & Procedures       ECHOCARDIOGRAM  ECHOCARDIOGRAM COMPLETE 12/19/2022  Narrative ECHOCARDIOGRAM REPORT    Patient Name:   Darlene Kelly Date of Exam: 12/19/2022 Medical Rec #:  [id number]       Height:       61.0 in Accession #:    [id number]      Weight:       176.8 lb Date of Birth:  [birth year]       BSA:          1.792 m Patient Age:    72 years        BP:           153/90 mmHg Patient Gender: F               HR:           88 bpm. Exam Location:   Outpatient  Procedure: 2D Echo, Color Doppler and Cardiac Doppler  Indications:     Aortic Stenosis i35.0  History:         Patient has prior history of Echocardiogram examinations, most recent 11/24/2021. Risk Factors:Hypertension, Diabetes and Dyslipidemia. History of chest radiation.  Sonographer:     Irving Burton Senior RDCS Referring Phys:  [id number] Tessa Lerner Diagnosing Phys: Tessa Lerner DO  IMPRESSIONS   1. Left ventricular diastolic function could not be evaluated due to mitral annular calcification (but suggestive of Grade 2 diastolic dysfunction). Left ventricular ejection fraction, by estimation, is 60 to 65%. The left ventricle has normal function. The left ventricle has no regional wall motion abnormalities. Elevated left atrial pressure. The E/e' is 21. 2. Right ventricular systolic function is normal. The right ventricular size is normal. There is normal pulmonary artery systolic pressure. 3. Left atrial size was mildly dilated. 4. The mitral valve is degenerative. Mild mitral valve regurgitation. No evidence of mitral stenosis. 5. Native trileaflet valve, calcified leaflet / annulus, trivial AR, moderate / severe AS (peak velocity 4.97m/s, PG 71 mmHG, MG , DI 0.26, AVA VTI 0.66cm2). 6. The inferior vena cava is normal in size with greater than 50% respiratory variability, suggesting right atrial pressure of 3 mmHg. 7. Rhythm strip during this exam demonstrates normal sinus rhythm.  Comparison(s): Prior study 11/23/2021: LVEF 60-65%, Grade 2 Diastolic dysfunction, elevated LAP, moderate LAE, moderate AS (peak velocity 3.38m/s, MG 21 mmHg, DI 0.3).  FINDINGS Left Ventricle: Left ventricular diastolic function could not be evaluated due to mitral annular calcification (but suggestive of Grade 2 diastolic dysfunction). Left ventricular ejection fraction, by estimation, is 60 to 65%. The left ventricle has normal function. The left ventricle has no regional wall motion  abnormalities. The left ventricular internal cavity size was normal in size. There is no left ventricular hypertrophy. Left ventricular diastolic function could not be evaluated due to mitral annular calcification (moderate or greater). Elevated left atrial pressure. The E/e' is 21.  Right Ventricle: The right ventricular size is normal. No increase in right ventricular wall thickness. Right ventricular systolic function is normal. There is normal pulmonary artery systolic pressure. The tricuspid regurgitant velocity is 2.81 m/s, and with an assumed right atrial pressure of 3 mmHg, the estimated right ventricular systolic pressure is 34.6 mmHg.  Left Atrium: Left atrial size was mildly dilated.  Right Atrium: Right atrial size  Cardiology Office Note:    Date:  01/11/2023   ID:  Darlene Kelly, DOB January 12, 1951, MRN [id number]  PCP:  Trisha Mangle, FNP   [facility] Providers Cardiologist:  None     Referring MD: Trisha Mangle, *   Chief Complaint  Patient presents with   Aortic Stenosis    History of Present Illness:    Darlene Kelly is a 72 y.o. female referred for evaluation of severe aortic stenosis. The patient has been followed by Dr Odis Hollingshead for moderate aortic stenosis in the past.  However, she has now been noted to have symptoms of fatigue and shortness of breath.  A recent echocardiogram showed normal LVEF of 60 to 65%, grade 2 diastolic dysfunction, normal RV function, and aortic stenosis with calcified restricted leaflets, peak velocity of 4.2 m/s, peak and mean gradients of 71 and 38 mmHg, dimensionless index of 0.26, and calculated aortic valve area of 0.66 cm.  In addition, her stroke-volume index was noted to be low at 31.  The patient's cardiovascular comorbid conditions include hypertension, mixed hyperlipidemia, former smoker, and bilateral carotid artery atherosclerosis.  The patient is here with her husband today. She has longstanding shortness of breath with exertion, but it has progressed significantly over the past 3 months. She has it with house work and chores. She also has chest tightness radiating down both arms. Typically, if she keeps walking, her symptoms improve. Symptoms worsen when going up hill. Occasionally she has mild pains and flutters when she is lying in bed. She has periodic lightheadedness. Yesterday she had near-syncope but this was unrelated to any physical exertion.   She was first told of having a heart murmur many years ago. She had rheumatic fever as a child. Her mother had CAD, but there is no family hx of valvular heart disease. Her oldest brother had multivessel CABG.    Current Medications: Current Meds  Medication Sig   ASPIRIN  81 PO Take 1 tablet by mouth daily.   Calcium Carbonate (CALCIUM 500 PO) Take 1 tablet by mouth 2 (two) times daily.   Cholecalciferol (VITAMIN D3) 25 MCG (1000 UT) CAPS Take 1 capsule by mouth daily.   Coenzyme Q10 (CO Q 10 PO) Take 200 mg by mouth daily at 12 noon.   Cyanocobalamin (B-12) 3000 MCG CAPS Take 1 capsule by mouth daily.   gabapentin (NEURONTIN) 300 MG capsule Take 1 capsule (300 mg total) by mouth 3 (three) times daily. Take a 300 mg capsule three times a day for two weeks Then a 300 mg capsule twice a day for two weeks Then a 300 mg capsule once a day for two weeks (Patient taking differently: Take 300 mg by mouth at bedtime. 1 by mouth in the evening)   MAGNESIUM GLYCINATE PO Take 80 mg by mouth in the morning and at bedtime.   Melatonin 10 MG TABS Take 1 tablet by mouth daily as needed.   metFORMIN (GLUCOPHAGE-XR) 500 MG 24 hr tablet Take 2 tablets by mouth daily with breakfast.   OZEMPIC, 0.25 OR 0.5 MG/DOSE, 2 MG/3ML SOPN Inject 0.5 mg into the skin once a week.   rosuvastatin (CRESTOR) 20 MG tablet Take 20 mg by mouth 2 (two) times daily.   vitamin C (ASCORBIC ACID) 500 MG tablet Take 500 mg by mouth daily.   Zinc Gluconate ([employer] ZINC PO) Take 1 tablet by mouth daily.     Allergies:   Latex and Lipitor [atorvastatin]   ROS:  Please see the history of present illness.     All other systems reviewed and are negative.  EKGs/Labs/Other Studies Reviewed:    The following studies were reviewed today: Cardiac Studies & Procedures       ECHOCARDIOGRAM  ECHOCARDIOGRAM COMPLETE 12/19/2022  Narrative ECHOCARDIOGRAM REPORT    Patient Name:   Darlene Kelly Date of Exam: 12/19/2022 Medical Rec #:  [id number]       Height:       61.0 in Accession #:    [id number]      Weight:       176.8 lb Date of Birth:  [birth year]       BSA:          1.792 m Patient Age:    72 years        BP:           153/90 mmHg Patient Gender: F               HR:           88 bpm. Exam Location:   Outpatient  Procedure: 2D Echo, Color Doppler and Cardiac Doppler  Indications:     Aortic Stenosis i35.0  History:         Patient has prior history of Echocardiogram examinations, most recent 11/24/2021. Risk Factors:Hypertension, Diabetes and Dyslipidemia. History of chest radiation.  Sonographer:     Irving Burton Senior RDCS Referring Phys:  [id number] Tessa Lerner Diagnosing Phys: Tessa Lerner DO  IMPRESSIONS   1. Left ventricular diastolic function could not be evaluated due to mitral annular calcification (but suggestive of Grade 2 diastolic dysfunction). Left ventricular ejection fraction, by estimation, is 60 to 65%. The left ventricle has normal function. The left ventricle has no regional wall motion abnormalities. Elevated left atrial pressure. The E/e' is 21. 2. Right ventricular systolic function is normal. The right ventricular size is normal. There is normal pulmonary artery systolic pressure. 3. Left atrial size was mildly dilated. 4. The mitral valve is degenerative. Mild mitral valve regurgitation. No evidence of mitral stenosis. 5. Native trileaflet valve, calcified leaflet / annulus, trivial AR, moderate / severe AS (peak velocity 4.97m/s, PG 71 mmHG, MG , DI 0.26, AVA VTI 0.66cm2). 6. The inferior vena cava is normal in size with greater than 50% respiratory variability, suggesting right atrial pressure of 3 mmHg. 7. Rhythm strip during this exam demonstrates normal sinus rhythm.  Comparison(s): Prior study 11/23/2021: LVEF 60-65%, Grade 2 Diastolic dysfunction, elevated LAP, moderate LAE, moderate AS (peak velocity 3.38m/s, MG 21 mmHg, DI 0.3).  FINDINGS Left Ventricle: Left ventricular diastolic function could not be evaluated due to mitral annular calcification (but suggestive of Grade 2 diastolic dysfunction). Left ventricular ejection fraction, by estimation, is 60 to 65%. The left ventricle has normal function. The left ventricle has no regional wall motion  abnormalities. The left ventricular internal cavity size was normal in size. There is no left ventricular hypertrophy. Left ventricular diastolic function could not be evaluated due to mitral annular calcification (moderate or greater). Elevated left atrial pressure. The E/e' is 21.  Right Ventricle: The right ventricular size is normal. No increase in right ventricular wall thickness. Right ventricular systolic function is normal. There is normal pulmonary artery systolic pressure. The tricuspid regurgitant velocity is 2.81 m/s, and with an assumed right atrial pressure of 3 mmHg, the estimated right ventricular systolic pressure is 34.6 mmHg.  Left Atrium: Left atrial size was mildly dilated.  Right Atrium: Right atrial size  Cardiology Office Note:    Date:  01/11/2023   ID:  Darlene Kelly, DOB January 12, 1951, MRN [id number]  PCP:  Trisha Mangle, FNP   [facility] Providers Cardiologist:  None     Referring MD: Trisha Mangle, *   Chief Complaint  Patient presents with   Aortic Stenosis    History of Present Illness:    Darlene Kelly is a 72 y.o. female referred for evaluation of severe aortic stenosis. The patient has been followed by Dr Odis Hollingshead for moderate aortic stenosis in the past.  However, she has now been noted to have symptoms of fatigue and shortness of breath.  A recent echocardiogram showed normal LVEF of 60 to 65%, grade 2 diastolic dysfunction, normal RV function, and aortic stenosis with calcified restricted leaflets, peak velocity of 4.2 m/s, peak and mean gradients of 71 and 38 mmHg, dimensionless index of 0.26, and calculated aortic valve area of 0.66 cm.  In addition, her stroke-volume index was noted to be low at 31.  The patient's cardiovascular comorbid conditions include hypertension, mixed hyperlipidemia, former smoker, and bilateral carotid artery atherosclerosis.  The patient is here with her husband today. She has longstanding shortness of breath with exertion, but it has progressed significantly over the past 3 months. She has it with house work and chores. She also has chest tightness radiating down both arms. Typically, if she keeps walking, her symptoms improve. Symptoms worsen when going up hill. Occasionally she has mild pains and flutters when she is lying in bed. She has periodic lightheadedness. Yesterday she had near-syncope but this was unrelated to any physical exertion.   She was first told of having a heart murmur many years ago. She had rheumatic fever as a child. Her mother had CAD, but there is no family hx of valvular heart disease. Her oldest brother had multivessel CABG.    Current Medications: Current Meds  Medication Sig   ASPIRIN  81 PO Take 1 tablet by mouth daily.   Calcium Carbonate (CALCIUM 500 PO) Take 1 tablet by mouth 2 (two) times daily.   Cholecalciferol (VITAMIN D3) 25 MCG (1000 UT) CAPS Take 1 capsule by mouth daily.   Coenzyme Q10 (CO Q 10 PO) Take 200 mg by mouth daily at 12 noon.   Cyanocobalamin (B-12) 3000 MCG CAPS Take 1 capsule by mouth daily.   gabapentin (NEURONTIN) 300 MG capsule Take 1 capsule (300 mg total) by mouth 3 (three) times daily. Take a 300 mg capsule three times a day for two weeks Then a 300 mg capsule twice a day for two weeks Then a 300 mg capsule once a day for two weeks (Patient taking differently: Take 300 mg by mouth at bedtime. 1 by mouth in the evening)   MAGNESIUM GLYCINATE PO Take 80 mg by mouth in the morning and at bedtime.   Melatonin 10 MG TABS Take 1 tablet by mouth daily as needed.   metFORMIN (GLUCOPHAGE-XR) 500 MG 24 hr tablet Take 2 tablets by mouth daily with breakfast.   OZEMPIC, 0.25 OR 0.5 MG/DOSE, 2 MG/3ML SOPN Inject 0.5 mg into the skin once a week.   rosuvastatin (CRESTOR) 20 MG tablet Take 20 mg by mouth 2 (two) times daily.   vitamin C (ASCORBIC ACID) 500 MG tablet Take 500 mg by mouth daily.   Zinc Gluconate ([employer] ZINC PO) Take 1 tablet by mouth daily.     Allergies:   Latex and Lipitor [atorvastatin]   ROS:  Cardiology Office Note:    Date:  01/11/2023   ID:  Darlene Kelly, DOB January 12, 1951, MRN [id number]  PCP:  Trisha Mangle, FNP   [facility] Providers Cardiologist:  None     Referring MD: Trisha Mangle, *   Chief Complaint  Patient presents with   Aortic Stenosis    History of Present Illness:    Darlene Kelly is a 72 y.o. female referred for evaluation of severe aortic stenosis. The patient has been followed by Dr Odis Hollingshead for moderate aortic stenosis in the past.  However, she has now been noted to have symptoms of fatigue and shortness of breath.  A recent echocardiogram showed normal LVEF of 60 to 65%, grade 2 diastolic dysfunction, normal RV function, and aortic stenosis with calcified restricted leaflets, peak velocity of 4.2 m/s, peak and mean gradients of 71 and 38 mmHg, dimensionless index of 0.26, and calculated aortic valve area of 0.66 cm.  In addition, her stroke-volume index was noted to be low at 31.  The patient's cardiovascular comorbid conditions include hypertension, mixed hyperlipidemia, former smoker, and bilateral carotid artery atherosclerosis.  The patient is here with her husband today. She has longstanding shortness of breath with exertion, but it has progressed significantly over the past 3 months. She has it with house work and chores. She also has chest tightness radiating down both arms. Typically, if she keeps walking, her symptoms improve. Symptoms worsen when going up hill. Occasionally she has mild pains and flutters when she is lying in bed. She has periodic lightheadedness. Yesterday she had near-syncope but this was unrelated to any physical exertion.   She was first told of having a heart murmur many years ago. She had rheumatic fever as a child. Her mother had CAD, but there is no family hx of valvular heart disease. Her oldest brother had multivessel CABG.    Current Medications: Current Meds  Medication Sig   ASPIRIN  81 PO Take 1 tablet by mouth daily.   Calcium Carbonate (CALCIUM 500 PO) Take 1 tablet by mouth 2 (two) times daily.   Cholecalciferol (VITAMIN D3) 25 MCG (1000 UT) CAPS Take 1 capsule by mouth daily.   Coenzyme Q10 (CO Q 10 PO) Take 200 mg by mouth daily at 12 noon.   Cyanocobalamin (B-12) 3000 MCG CAPS Take 1 capsule by mouth daily.   gabapentin (NEURONTIN) 300 MG capsule Take 1 capsule (300 mg total) by mouth 3 (three) times daily. Take a 300 mg capsule three times a day for two weeks Then a 300 mg capsule twice a day for two weeks Then a 300 mg capsule once a day for two weeks (Patient taking differently: Take 300 mg by mouth at bedtime. 1 by mouth in the evening)   MAGNESIUM GLYCINATE PO Take 80 mg by mouth in the morning and at bedtime.   Melatonin 10 MG TABS Take 1 tablet by mouth daily as needed.   metFORMIN (GLUCOPHAGE-XR) 500 MG 24 hr tablet Take 2 tablets by mouth daily with breakfast.   OZEMPIC, 0.25 OR 0.5 MG/DOSE, 2 MG/3ML SOPN Inject 0.5 mg into the skin once a week.   rosuvastatin (CRESTOR) 20 MG tablet Take 20 mg by mouth 2 (two) times daily.   vitamin C (ASCORBIC ACID) 500 MG tablet Take 500 mg by mouth daily.   Zinc Gluconate (CVS ZINC PO) Take 1 tablet by mouth daily.     Allergies:   Latex and Lipitor [atorvastatin]   ROS:  Cardiology Office Note:    Date:  01/11/2023   ID:  Darlene Kelly, DOB January 12, 1951, MRN [id number]  PCP:  Trisha Mangle, FNP   [facility] Providers Cardiologist:  None     Referring MD: Trisha Mangle, *   Chief Complaint  Patient presents with   Aortic Stenosis    History of Present Illness:    Darlene Kelly is a 72 y.o. female referred for evaluation of severe aortic stenosis. The patient has been followed by Dr Odis Hollingshead for moderate aortic stenosis in the past.  However, she has now been noted to have symptoms of fatigue and shortness of breath.  A recent echocardiogram showed normal LVEF of 60 to 65%, grade 2 diastolic dysfunction, normal RV function, and aortic stenosis with calcified restricted leaflets, peak velocity of 4.2 m/s, peak and mean gradients of 71 and 38 mmHg, dimensionless index of 0.26, and calculated aortic valve area of 0.66 cm.  In addition, her stroke-volume index was noted to be low at 31.  The patient's cardiovascular comorbid conditions include hypertension, mixed hyperlipidemia, former smoker, and bilateral carotid artery atherosclerosis.  The patient is here with her husband today. She has longstanding shortness of breath with exertion, but it has progressed significantly over the past 3 months. She has it with house work and chores. She also has chest tightness radiating down both arms. Typically, if she keeps walking, her symptoms improve. Symptoms worsen when going up hill. Occasionally she has mild pains and flutters when she is lying in bed. She has periodic lightheadedness. Yesterday she had near-syncope but this was unrelated to any physical exertion.   She was first told of having a heart murmur many years ago. She had rheumatic fever as a child. Her mother had CAD, but there is no family hx of valvular heart disease. Her oldest brother had multivessel CABG.    Current Medications: Current Meds  Medication Sig   ASPIRIN  81 PO Take 1 tablet by mouth daily.   Calcium Carbonate (CALCIUM 500 PO) Take 1 tablet by mouth 2 (two) times daily.   Cholecalciferol (VITAMIN D3) 25 MCG (1000 UT) CAPS Take 1 capsule by mouth daily.   Coenzyme Q10 (CO Q 10 PO) Take 200 mg by mouth daily at 12 noon.   Cyanocobalamin (B-12) 3000 MCG CAPS Take 1 capsule by mouth daily.   gabapentin (NEURONTIN) 300 MG capsule Take 1 capsule (300 mg total) by mouth 3 (three) times daily. Take a 300 mg capsule three times a day for two weeks Then a 300 mg capsule twice a day for two weeks Then a 300 mg capsule once a day for two weeks (Patient taking differently: Take 300 mg by mouth at bedtime. 1 by mouth in the evening)   MAGNESIUM GLYCINATE PO Take 80 mg by mouth in the morning and at bedtime.   Melatonin 10 MG TABS Take 1 tablet by mouth daily as needed.   metFORMIN (GLUCOPHAGE-XR) 500 MG 24 hr tablet Take 2 tablets by mouth daily with breakfast.   OZEMPIC, 0.25 OR 0.5 MG/DOSE, 2 MG/3ML SOPN Inject 0.5 mg into the skin once a week.   rosuvastatin (CRESTOR) 20 MG tablet Take 20 mg by mouth 2 (two) times daily.   vitamin C (ASCORBIC ACID) 500 MG tablet Take 500 mg by mouth daily.   Zinc Gluconate ([employer]S ZINC PO) Take 1 tablet by mouth daily.     Allergies:   Latex and Lipitor [atorvastatin]   ROS:

## 2023-01-11 NOTE — Progress Notes (Signed)
Pre Surgical Assessment: 5 M Walk Test  28M=16.19ft  5 Meter Walk Test- trial 1: 4.04 seconds 5 Meter Walk Test- trial 2: 4.23 seconds 5 Meter Walk Test- trial 3: 4.43 seconds 5 Meter Walk Test Average: 4.23 seconds

## 2023-01-11 NOTE — Telephone Encounter (Signed)
Patient left blood pressure readings at the front desk.  I am placing them in Tessa's mail box.  Thank you.

## 2023-01-12 LAB — CBC
Hematocrit: 40.7 % (ref 34.0–46.6)
Hemoglobin: 13.7 g/dL (ref 11.1–15.9)
MCH: 30.7 pg (ref 26.6–33.0)
MCHC: 33.7 g/dL (ref 31.5–35.7)
MCV: 91 fL (ref 79–97)
Platelets: 238 10*3/uL (ref 150–450)
RBC: 4.46 x10E6/uL (ref 3.77–5.28)
RDW: 14 % (ref 11.7–15.4)
WBC: 8.5 10*3/uL (ref 3.4–10.8)

## 2023-01-12 LAB — BASIC METABOLIC PANEL
BUN/Creatinine Ratio: 14 (ref 12–28)
BUN: 16 mg/dL (ref 8–27)
CO2: 22 mmol/L (ref 20–29)
Calcium: 10.9 mg/dL — ABNORMAL HIGH (ref 8.7–10.3)
Chloride: 99 mmol/L (ref 96–106)
Creatinine, Ser: 1.13 mg/dL — ABNORMAL HIGH (ref 0.57–1.00)
Glucose: 129 mg/dL — ABNORMAL HIGH (ref 70–99)
Potassium: 3.6 mmol/L (ref 3.5–5.2)
Sodium: 141 mmol/L (ref 134–144)
eGFR: 52 mL/min/{1.73_m2} — ABNORMAL LOW (ref >59)

## 2023-01-14 ENCOUNTER — Telehealth: Payer: Self-pay | Admitting: *Deleted

## 2023-01-14 ENCOUNTER — Telehealth: Payer: Self-pay | Admitting: Physician Assistant

## 2023-01-14 NOTE — Telephone Encounter (Signed)
Patient drop off BP reading for review, Will be in Tessa's box

## 2023-01-14 NOTE — Telephone Encounter (Addendum)
Cardiac Catheterization scheduled at [facility] for: Tuesday January 15, 2023 8 AM Arrival time [facility] Main Entrance A at: 6 AM  Nothing to eat after midnight prior to procedure, clear liquids until 5 AM day of procedure.  Medication instructions: -Hold:  Metformin-day of procedure and 48 hours post procedure  Pt reports Ozempic weekly on Fridays-last dose 01/11/23 -Other usual morning medications can be taken with sips of water including aspirin 81 mg.  Plan to go home the same day, you will only stay overnight if medically necessary.  You must have responsible adult to drive you home.  Someone must be with you the first 24 hours after you arrive home.  Reviewed procedure instructions with patient.

## 2023-01-15 ENCOUNTER — Other Ambulatory Visit: Payer: Self-pay

## 2023-01-15 ENCOUNTER — Encounter (HOSPITAL_COMMUNITY)
Admission: RE | Disposition: A | Discharge status: Home / Self Care | Payer: Self-pay | Source: Home / Self Care | Attending: Cardiovascular Disease

## 2023-01-15 ENCOUNTER — Ambulatory Visit (HOSPITAL_COMMUNITY)
Admission: RE | Admit: 2023-01-15 | Discharge: 2023-01-15 | Disposition: A | Discharge status: Home / Self Care | Payer: Medicare Other | Attending: Cardiovascular Disease | Admitting: Cardiovascular Disease

## 2023-01-15 DIAGNOSIS — Z853 Personal history of malignant neoplasm of breast: Secondary | ICD-10-CM | POA: Insufficient documentation

## 2023-01-15 DIAGNOSIS — Z7982 Long term (current) use of aspirin: Secondary | ICD-10-CM | POA: Insufficient documentation

## 2023-01-15 DIAGNOSIS — I350 Nonrheumatic aortic (valve) stenosis: Secondary | ICD-10-CM | POA: Diagnosis present

## 2023-01-15 DIAGNOSIS — R0609 Other forms of dyspnea: Secondary | ICD-10-CM | POA: Insufficient documentation

## 2023-01-15 DIAGNOSIS — I2510 Atherosclerotic heart disease of native coronary artery without angina pectoris: Secondary | ICD-10-CM | POA: Diagnosis not present

## 2023-01-15 DIAGNOSIS — Z79899 Other long term (current) drug therapy: Secondary | ICD-10-CM | POA: Insufficient documentation

## 2023-01-15 DIAGNOSIS — E782 Mixed hyperlipidemia: Secondary | ICD-10-CM | POA: Insufficient documentation

## 2023-01-15 DIAGNOSIS — I10 Essential (primary) hypertension: Secondary | ICD-10-CM | POA: Insufficient documentation

## 2023-01-15 DIAGNOSIS — I2584 Coronary atherosclerosis due to calcified coronary lesion: Secondary | ICD-10-CM | POA: Insufficient documentation

## 2023-01-15 DIAGNOSIS — Z87891 Personal history of nicotine dependence: Secondary | ICD-10-CM | POA: Diagnosis not present

## 2023-01-15 DIAGNOSIS — I6523 Occlusion and stenosis of bilateral carotid arteries: Secondary | ICD-10-CM | POA: Insufficient documentation

## 2023-01-15 DIAGNOSIS — Z8249 Family history of ischemic heart disease and other diseases of the circulatory system: Secondary | ICD-10-CM | POA: Diagnosis not present

## 2023-01-15 HISTORY — PX: RIGHT HEART CATH AND CORONARY ANGIOGRAPHY: CATH118264

## 2023-01-15 LAB — POCT I-STAT EG7
Acid-Base Excess: 0 mmol/L (ref 0.0–2.0)
Bicarbonate: 24.7 mmol/L (ref 20.0–28.0)
Calcium, Ion: 1.17 mmol/L (ref 1.15–1.40)
HCT: 33 % — ABNORMAL LOW (ref 36.0–46.0)
Hemoglobin: 11.2 g/dL — ABNORMAL LOW (ref 12.0–15.0)
O2 Saturation: 64 %
Potassium: 2.5 mmol/L — CL (ref 3.5–5.1)
Sodium: 141 mmol/L (ref 135–145)
TCO2: 26 mmol/L (ref 22–32)
pCO2, Ven: 40.6 mm[Hg] — ABNORMAL LOW (ref 44–60)
pH, Ven: 7.393 (ref 7.25–7.43)
pO2, Ven: 33 mm[Hg] (ref 32–45)

## 2023-01-15 LAB — POCT I-STAT 7, (LYTES, BLD GAS, ICA,H+H)
Acid-base deficit: 2 mmol/L (ref 0.0–2.0)
Bicarbonate: 22.5 mmol/L (ref 20.0–28.0)
Calcium, Ion: 1.12 mmol/L — ABNORMAL LOW (ref 1.15–1.40)
HCT: 32 % — ABNORMAL LOW (ref 36.0–46.0)
Hemoglobin: 10.9 g/dL — ABNORMAL LOW (ref 12.0–15.0)
O2 Saturation: 89 %
Potassium: 2.4 mmol/L — CL (ref 3.5–5.1)
Sodium: 142 mmol/L (ref 135–145)
TCO2: 24 mmol/L (ref 22–32)
pCO2 arterial: 36.3 mm[Hg] (ref 32–48)
pH, Arterial: 7.401 (ref 7.35–7.45)
pO2, Arterial: 57 mm[Hg] — ABNORMAL LOW (ref 83–108)

## 2023-01-15 LAB — GLUCOSE, CAPILLARY
Glucose-Capillary: 153 mg/dL — ABNORMAL HIGH (ref 70–99)
Glucose-Capillary: 94 mg/dL (ref 70–99)

## 2023-01-15 SURGERY — RIGHT HEART CATH AND CORONARY ANGIOGRAPHY
Anesthesia: LOCAL

## 2023-01-15 MED ORDER — SODIUM CHLORIDE 0.9 % IV SOLN: INTRAVENOUS | Status: DC

## 2023-01-15 MED ORDER — SODIUM CHLORIDE 0.9 % WEIGHT BASED INFUSION
3.0000 mL/kg/h | INTRAVENOUS | Status: AC
  Administered 2023-01-15: 3 mL/kg/h via INTRAVENOUS

## 2023-01-15 MED ORDER — HEPARIN (PORCINE) IN NACL 2000-0.9 UNIT/L-% IV SOLN
INTRAVENOUS | Status: DC | PRN
  Administered 2023-01-15: 1000 mL

## 2023-01-15 MED ORDER — SODIUM CHLORIDE 0.9% FLUSH: 3.0000 mL | INTRAVENOUS | Status: DC | PRN

## 2023-01-15 MED ORDER — ACETAMINOPHEN 325 MG PO TABS: 650.0000 mg | ORAL_TABLET | ORAL | Status: DC | PRN

## 2023-01-15 MED ORDER — HEPARIN SODIUM (PORCINE) 1000 UNIT/ML IJ SOLN
INTRAMUSCULAR | Status: AC
  Filled 2023-01-15: qty 10

## 2023-01-15 MED ORDER — FENTANYL CITRATE (PF) 100 MCG/2ML IJ SOLN
INTRAMUSCULAR | Status: AC
  Filled 2023-01-15: qty 2

## 2023-01-15 MED ORDER — HYDRALAZINE HCL 20 MG/ML IJ SOLN: 10.0000 mg | INTRAMUSCULAR | Status: DC | PRN

## 2023-01-15 MED ORDER — SODIUM CHLORIDE 0.9 % IV SOLN: 250.0000 mL | INTRAVENOUS | Status: DC | PRN

## 2023-01-15 MED ORDER — ASPIRIN 81 MG PO CHEW: 81.0000 mg | CHEWABLE_TABLET | ORAL | Status: DC

## 2023-01-15 MED ORDER — LIDOCAINE HCL (PF) 1 % IJ SOLN
INTRAMUSCULAR | Status: AC
  Filled 2023-01-15: qty 30

## 2023-01-15 MED ORDER — LIDOCAINE HCL (PF) 1 % IJ SOLN
INTRAMUSCULAR | Status: DC | PRN
  Administered 2023-01-15 (×2): 2 mL

## 2023-01-15 MED ORDER — SODIUM CHLORIDE 0.9% FLUSH: 3.0000 mL | Freq: Two times a day (BID) | INTRAVENOUS | Status: DC

## 2023-01-15 MED ORDER — MIDAZOLAM HCL 2 MG/2ML IJ SOLN
INTRAMUSCULAR | Status: DC | PRN
  Administered 2023-01-15 (×2): 1 mg via INTRAVENOUS

## 2023-01-15 MED ORDER — VERAPAMIL HCL 2.5 MG/ML IV SOLN
INTRAVENOUS | Status: DC | PRN
  Administered 2023-01-15: 10 mL via INTRA_ARTERIAL

## 2023-01-15 MED ORDER — SODIUM CHLORIDE 0.9 % WEIGHT BASED INFUSION: 1.0000 mL/kg/h | INTRAVENOUS | Status: DC

## 2023-01-15 MED ORDER — FENTANYL CITRATE (PF) 100 MCG/2ML IJ SOLN
INTRAMUSCULAR | Status: DC | PRN
  Administered 2023-01-15 (×2): 25 ug via INTRAVENOUS

## 2023-01-15 MED ORDER — IOHEXOL 350 MG/ML SOLN
INTRAVENOUS | Status: DC | PRN
  Administered 2023-01-15: 25 mL

## 2023-01-15 MED ORDER — MIDAZOLAM HCL 2 MG/2ML IJ SOLN
INTRAMUSCULAR | Status: AC
  Filled 2023-01-15: qty 2

## 2023-01-15 MED ORDER — HEPARIN SODIUM (PORCINE) 1000 UNIT/ML IJ SOLN
INTRAMUSCULAR | Status: DC | PRN
  Administered 2023-01-15: 4000 [IU] via INTRAVENOUS

## 2023-01-15 MED ORDER — VERAPAMIL HCL 2.5 MG/ML IV SOLN
INTRAVENOUS | Status: AC
  Filled 2023-01-15: qty 2

## 2023-01-15 MED ORDER — ONDANSETRON HCL 4 MG/2ML IJ SOLN: 4.0000 mg | Freq: Four times a day (QID) | INTRAMUSCULAR | Status: DC | PRN

## 2023-01-15 MED ORDER — LABETALOL HCL 5 MG/ML IV SOLN: 10.0000 mg | INTRAVENOUS | Status: DC | PRN

## 2023-01-15 SURGICAL SUPPLY — 10 items
CATH BALLN WEDGE 5F 110CM (CATHETERS) IMPLANT
CATH INFINITI 5FR MULTPACK ANG (CATHETERS) IMPLANT
DEVICE RAD COMP TR BAND LRG (VASCULAR PRODUCTS) IMPLANT
GLIDESHEATH SLEND SS 6F .021 (SHEATH) IMPLANT
GUIDEWIRE INQWIRE 1.5J.035X260 (WIRE) IMPLANT
INQWIRE 1.5J .035X260CM (WIRE) ×1
KIT SYRINGE INJ CVI SPIKEX1 (MISCELLANEOUS) IMPLANT
PACK CARDIAC CATHETERIZATION (CUSTOM PROCEDURE TRAY) ×1 IMPLANT
SET ATX-X65L (MISCELLANEOUS) IMPLANT
SHEATH GLIDE SLENDER 4/5FR (SHEATH) IMPLANT

## 2023-01-15 NOTE — Interval H&P Note (Signed)
History and Physical Interval Note:  01/15/2023 6:16 AM  Darlene Kelly  has presented today for surgery, with the diagnosis of aortic stenosis.  The various methods of treatment have been discussed with the patient and family. After consideration of risks, benefits and other options for treatment, the patient has consented to  Procedure(s): RIGHT/LEFT HEART CATH AND CORONARY ANGIOGRAPHY (N/A) as a surgical intervention.  The patient's history has been reviewed, patient examined, no change in status, stable for surgery.  I have reviewed the patient's chart and labs.  Questions were answered to the patient's satisfaction.     Tonny Bollman

## 2023-01-15 NOTE — Discharge Instructions (Signed)
Radial Site Care  This sheet gives you information about how to care for yourself after your procedure. Your health care provider may also give you more specific instructions. If you have problems or questions, contact your health care provider. What can I expect after the procedure? After the procedure, it is common to have: Bruising and tenderness at the catheter insertion area. Follow these instructions at home: Medicines Take over-the-counter and prescription medicines only as told by your health care provider. Insertion site care Follow instructions from your health care provider about how to take care of your insertion site. Make sure you: Wash your hands with soap and water before you remove your bandage (dressing). If soap and water are not available, use hand sanitizer. May remove dressing in 24 hours. Check your insertion site every day for signs of infection. Check for: Redness, swelling, or pain. Fluid or blood. Pus or a bad smell. Warmth. Do no take baths, swim, or use a hot tub for 5 days. You may shower 24-48 hours after the procedure. Remove the dressing and gently wash the site with plain soap and water. Pat the area dry with a clean towel. Do not rub the site. That could cause bleeding. Do not apply powder or lotion to the site. Activity  For 24 hours after the procedure, or as directed by your health care provider: Do not flex or bend the affected arm. Do not push or pull heavy objects with the affected arm. Do not drive yourself home from the hospital or clinic. You may drive 24 hours after the procedure. Do not operate machinery or power tools. KEEP ARM ELEVATED THE REMAINDER OF THE DAY. Do not push, pull or lift anything that is heavier than 10 lb for 5 days. Ask your health care provider when it is okay to: Return to work or school. Resume usual physical activities or sports. Resume sexual activity. General instructions If the catheter site starts to  bleed, raise your arm and put firm pressure on the site. If the bleeding does not stop, get help right away. This is a medical emergency. DRINK PLENTY OF FLUIDS FOR THE NEXT 2-3 DAYS. No alcohol consumption for 24 hours after receiving sedation. If you went home on the same day as your procedure, a responsible adult should be with you for the first 24 hours after you arrive home. Keep all follow-up visits as told by your health care provider. This is important. Contact a health care provider if: You have a fever. You have redness, swelling, or yellow drainage around your insertion site. Get help right away if: You have unusual pain at the radial site. The catheter insertion area swells very fast. The insertion area is bleeding, and the bleeding does not stop when you hold steady pressure on the area. Your arm or hand becomes pale, cool, tingly, or numb. These symptoms may represent a serious problem that is an emergency. Do not wait to see if the symptoms will go away. Get medical help right away. Call your local emergency services (911 in the [place] Do not drive yourself to the hospital. Summary After the procedure, it is common to have bruising and tenderness at the site. Follow instructions from your health care provider about how to take care of your radial site wound. Check the wound every day for signs of infection.  This information is not intended to replace advice given to you by your health care provider. Make sure you discuss any questions you have with   your health care provider. Document Revised: 04/24/2017 Document Reviewed: 04/24/2017 Elsevier Patient Education  2020 [employer]   Brachial Site Care   This sheet gives you information about how to care for yourself after your procedure. Your health care provider may also give you more specific instructions. If you have problems or questions, contact your health care provider. What can I expect after the procedure? After the  procedure, it is common to have: Bruising and tenderness at the catheter insertion area. Follow these instructions at home:  Insertion site care Follow instructions from your health care provider about how to take care of your insertion site. Make sure you: Wash your hands with soap and water before you change your bandage (dressing). If soap and water are not available, use hand sanitizer. Remove your dressing as told by your health care provider. In 24 hours Check your insertion site every day for signs of infection. Check for: Redness, swelling, or pain. Pus or a bad smell. Warmth. You may shower 24-48 hours after the procedure. Do not apply powder or lotion to the site.  Activity For 24 hours after the procedure, or as directed by your health care provider: Do not push or pull heavy objects with the affected arm. Do not drive yourself home from the hospital or clinic. You may drive 24 hours after the procedure unless your health care provider tells you not to. Do not lift anything that is heavier than 10 lb (4.5 kg), or the limit that you are told, until your health care provider says that it is safe.  For 24 hours   

## 2023-01-16 ENCOUNTER — Encounter: Payer: Self-pay | Admitting: *Deleted

## 2023-01-16 ENCOUNTER — Other Ambulatory Visit: Payer: Self-pay | Admitting: *Deleted

## 2023-01-16 ENCOUNTER — Other Ambulatory Visit: Payer: Self-pay | Admitting: Surgery

## 2023-01-16 ENCOUNTER — Institutional Professional Consult (permissible substitution) (INDEPENDENT_AMBULATORY_CARE_PROVIDER_SITE_OTHER): Payer: Medicare Other | Admitting: Surgery

## 2023-01-16 ENCOUNTER — Encounter (HOSPITAL_COMMUNITY): Payer: Self-pay | Admitting: Cardiovascular Disease

## 2023-01-16 VITALS — BP 178/96 | HR 82 | Resp 20 | Ht 61.0 in | Wt 155.0 lb

## 2023-01-16 DIAGNOSIS — I2510 Atherosclerotic heart disease of native coronary artery without angina pectoris: Secondary | ICD-10-CM

## 2023-01-16 DIAGNOSIS — I350 Nonrheumatic aortic (valve) stenosis: Secondary | ICD-10-CM

## 2023-01-16 NOTE — Progress Notes (Unsigned)
Cardiothoracic Surgery Consultation  PCP is Trisha Mangle, FNP Referring Provider is Tonny Bollman, MD  Chief Complaint  Patient presents with  . Coronary Artery Disease  . Aortic Stenosis    Surgical consult    HPI:  The patient is a 72 year old woman with history of hypertension, diabetes, right breast carcinoma status post chemotherapy and radiation therapy in 2016, a remote history of rheumatic fever as a child, and moderate aortic stenosis that has been followed by Dr. Odis Hollingshead.  She has developed progressively worsening exertional shortness of breath and fatigue as well as exertional chest discomfort radiating into her arms over the past several months.  She walks daily for about 1-1/2 miles with her husband and dogs and frequently gets symptoms that improved with rest.  She has had occasional episodes of chest discomfort while lying in bed.  She has had some lightheadedness but no syncope.  Her most recent echocardiogram showed an ejection fraction of 60 to 65% with grade 2 diastolic dysfunction.  There was an increase in the mean aortic valve gradient to 38 mmHg with a peak velocity of 4.2 m/s.  Dimensionless index was 0.26 with a valve area of 0.66 cm and a low stroke-volume index of 31.  She was seen by Dr. [name] for consideration of treatment of her severe aortic stenosis and underwent cardiac catheterization yesterday showing severe distal left main and three-vessel coronary disease.   Past Medical History:  Diagnosis Date  . Breast cancer (HCC)   . Breast cancer of upper-outer quadrant of right female breast (HCC) 07/16/2014  . Diabetes mellitus without complication (HCC)   . Hypertension   . Personal history of chemotherapy 2016  . Personal history of radiation therapy   . Skin cancer     Past Surgical History:  Procedure Laterality Date  . BREAST BIOPSY Right 07/13/2014   malignant  . BREAST LUMPECTOMY Right 02/11/2015   malignant  . PORT-A-CATH REMOVAL  Left 02/11/2015   Procedure: REMOVAL PORT-A-CATH;  Surgeon: Glenna Fellows, MD;  Location: [facility] OR;  Service: General;  Laterality: Left;  . PORTACATH PLACEMENT N/A 08/03/2014   Procedure: INSERTION PORT-A-CATH;  Surgeon: Glenna Fellows, MD;  Location: [facility] ORS;  Service: General;  Laterality: N/A;  . RADIOACTIVE SEED GUIDED PARTIAL MASTECTOMY WITH AXILLARY SENTINEL LYMPH NODE BIOPSY Right 02/11/2015   Procedure: RIGHT RADIOACTIVE SEED LOCALIZATION LUMPECTOMY  WITH  RIGHT AXILLARY SENTINEL LYMPH NODE BIOPSY;  Surgeon: Glenna Fellows, MD;  Location: [facility] OR;  Service: General;  Laterality: Right;  . RIGHT HEART CATH AND CORONARY ANGIOGRAPHY N/A 01/15/2023   Procedure: RIGHT HEART CATH AND CORONARY ANGIOGRAPHY;  Surgeon: Tonny Bollman, MD;  Location: [facility] INVASIVE CV LAB;  Service: Cardiovascular;  Laterality: N/A;  . TOTAL KNEE ARTHROPLASTY Left 01/19/2019   Procedure: TOTAL KNEE ARTHROPLASTY;  Surgeon: Ollen Gross, MD;  Location: [facility] ORS;  Service: Orthopedics;  Laterality: Left;     Family History  Problem Relation Age of Onset  . Heart disease Mother   . Lung cancer Father   . Heart disease Father   . Heart disease Brother     Social History Social History   Tobacco Use  . Smoking status: Former    Current packs/day: 0.00    Average packs/day: 0.5 packs/day for 12.0 years (6.0 ttl pk-yrs)    Types: Cigarettes    Start date: 04/02/1974    Quit date: 04/02/1986    Years since quitting: 36.8  . Smokeless tobacco: Never  Vaping Use  .  Vaping status: Never Used  Substance Use Topics  . Alcohol use: Yes    Alcohol/week: 3.0 standard drinks of alcohol    Types: 3 Glasses of wine per week    Comment: occ  . Drug use: No    Current Outpatient Medications  Medication Sig Dispense Refill  . aspirin EC 81 MG tablet Take 81 mg by mouth every evening. Swallow whole.    . Calcium-Magnesium-Zinc (CAL-MAG-ZINC PO) Take 1 tablet by mouth 2 (two) times daily.    . Coenzyme Q10  (COQ10) 400 MG CAPS Take 400 mg by mouth daily.    . Cyanocobalamin (B-12) 3000 MCG CAPS Take 3,000 mcg by mouth daily at 12 noon.    . gabapentin (NEURONTIN) 300 MG capsule Take 1 capsule (300 mg total) by mouth 3 (three) times daily. Take a 300 mg capsule three times a day for two weeks Then a 300 mg capsule twice a day for two weeks Then a 300 mg capsule once a day for two weeks (Patient taking differently: Take 300 mg by mouth every evening.) 84 capsule 0  . Melatonin 10 MG TABS Take 1 tablet by mouth at bedtime.    . metFORMIN (GLUCOPHAGE-XR) 500 MG 24 hr tablet Take 1,000 mg by mouth daily with breakfast.    . OZEMPIC, 0.25 OR 0.5 MG/DOSE, 2 MG/3ML SOPN Inject 0.5 mg into the skin every Friday.    . rosuvastatin (CRESTOR) 40 MG tablet Take 20 mg by mouth 2 (two) times daily.    . vitamin C (ASCORBIC ACID) 500 MG tablet Take 500 mg by mouth daily.     No current facility-administered medications for this visit.    Allergies  Allergen Reactions  . Latex Itching, Dermatitis and Rash  . Lipitor [Atorvastatin] Other (See Comments)    Bad leg cramps    Review of Systems  Constitutional:  Positive for fatigue. Negative for activity change.  HENT: Negative.  Negative for dental problem.        Last saw dentist June 2024  Eyes: Negative.   Respiratory:  Positive for shortness of breath.   Cardiovascular:  Positive for chest pain and palpitations. Negative for leg swelling.  Gastrointestinal:  Positive for constipation.  Endocrine: Negative.   Genitourinary: Negative.   Musculoskeletal: Negative.   Skin: Negative.   Allergic/Immunologic: Negative.   Neurological:  Positive for dizziness. Negative for syncope.  Hematological:  Bruises/bleeds easily.  Psychiatric/Behavioral: Negative.      BP (!) 178/96   Pulse 82   Resp 20   Ht 5\' 1"  (1.549 m)   Wt 155 lb (70.3 kg)   SpO2 97% Comment: RA  BMI 29.29 kg/m  Physical Exam Constitutional:      Appearance: Normal appearance.   HENT:     Head: Normocephalic and atraumatic.  Eyes:     Extraocular Movements: Extraocular movements intact.     Conjunctiva/sclera: Conjunctivae normal.     Pupils: Pupils are equal, round, and reactive to light.  Neck:     Vascular: No carotid bruit.  Cardiovascular:     Rate and Rhythm: Normal rate and regular rhythm.     Pulses: Normal pulses.     Heart sounds: Murmur heard.     Comments: 3/6 systolic murmur along the right sternal border.  There is no diastolic murmur. Pulmonary:     Effort: Pulmonary effort is normal.     Breath sounds: Normal breath sounds.  Abdominal:     General: There is no distension.  Tenderness: There is no abdominal tenderness.  Musculoskeletal:        General: No swelling.  Skin:    General: Skin is warm and dry.  Neurological:     General: No focal deficit present.     Mental Status: She is alert and oriented to person, place, and time.  Psychiatric:        Mood and Affect: Mood normal.        Behavior: Behavior normal.     Diagnostic Tests:  ECHOCARDIOGRAM REPORT       Patient Name:   ALYSCIA BEECK Lannan Date of Exam: 12/19/2022  Medical Rec #:  [id number]       Height:       61.0 in  Accession #:    [id number]      Weight:       176.8 lb  Date of Birth:  [birth year]       BSA:          1.792 m  Patient Age:    72 years        BP:           153/90 mmHg  Patient Gender: F               HR:           88 bpm.  Exam Location:  Outpatient   Procedure: 2D Echo, Color Doppler and Cardiac Doppler   Indications:     Aortic Stenosis i35.0    History:         Patient has prior history of Echocardiogram examinations,  most                  recent 11/24/2021. Risk Factors:Hypertension, Diabetes and                   Dyslipidemia. History of chest radiation.    Sonographer:     Irving Burton Senior RDCS  Referring Phys:  [id number] Tessa Lerner  Diagnosing Phys: Tessa Lerner DO   IMPRESSIONS     1. Left ventricular diastolic function could not be  evaluated due to  mitral annular calcification (but suggestive of Grade 2 diastolic  dysfunction). Left ventricular ejection fraction, by estimation, is 60 to  65%. The left ventricle has normal function.   The left ventricle has no regional wall motion abnormalities. Elevated  left atrial pressure. The E/e' is 21.   2. Right ventricular systolic function is normal. The right ventricular  size is normal. There is normal pulmonary artery systolic pressure.   3. Left atrial size was mildly dilated.   4. The mitral valve is degenerative. Mild mitral valve regurgitation. No  evidence of mitral stenosis.   5. Native trileaflet valve, calcified leaflet / annulus, trivial AR,  moderate / severe AS (peak velocity 4.19m/s, PG 71 mmHG, MG , DI  0.26, AVA VTI 0.66cm2).   6. The inferior vena cava is normal in size with greater than 50%  respiratory variability, suggesting right atrial pressure of 3 mmHg.   7. Rhythm strip during this exam demonstrates normal sinus rhythm.   Comparison(s): Prior study 11/23/2021: LVEF 60-65%, Grade 2 Diastolic  dysfunction, elevated LAP, moderate LAE, moderate AS (peak velocity  3.11m/s, MG 21 mmHg, DI 0.3).   FINDINGS   Left Ventricle: Left ventricular diastolic function could not be  evaluated due to mitral annular calcification (but suggestive of Grade 2  diastolic dysfunction). Left ventricular ejection fraction, by estimation,  is 60 to  65%. The left ventricle has  normal function. The left ventricle has no regional wall motion  abnormalities. The left ventricular internal cavity size was normal in  size. There is no left ventricular hypertrophy. Left ventricular diastolic  function could not be evaluated due to  mitral annular calcification (moderate or greater). Elevated left atrial  pressure. The E/e' is 21.   Right Ventricle: The right ventricular size is normal. No increase in  right ventricular wall thickness. Right ventricular systolic function  is  normal. There is normal pulmonary artery systolic pressure. The tricuspid  regurgitant velocity is 2.81 m/s, and   with an assumed right atrial pressure of 3 mmHg, the estimated right  ventricular systolic pressure is 34.6 mmHg.   Left Atrium: Left atrial size was mildly dilated.   Right Atrium: Right atrial size was normal in size.   Pericardium: There is no evidence of pericardial effusion.   Mitral Valve: The mitral valve is degenerative in appearance. There is  moderate thickening of the posterior mitral valve leaflet(s). There is  moderate calcification of the posterior mitral valve leaflet(s). Mild  mitral annular calcification. Mild mitral  valve regurgitation. No evidence of mitral valve stenosis. MV peak  gradient, 9.9 mmHg. The mean mitral valve gradient is 3.8 mmHg.   Tricuspid Valve: The tricuspid valve is grossly normal. Tricuspid valve  regurgitation is trivial. No evidence of tricuspid stenosis.   Aortic Valve: Native trileaflet valve, calcified leaflet / annulus,  trivial AR, moderate / severe AS (peak velocity 4.34m/s, PG 71 mmHG, MG  , DI 0.26, AVA VTI 0.66cm2). Aortic valve mean gradient measures  38.0 mmHg. Aortic valve peak gradient  measures 70.6 mmHg. Aortic valve area, by VTI measures 0.66 cm.   Pulmonic Valve: The pulmonic valve was not well visualized. Pulmonic valve  regurgitation is not visualized. No evidence of pulmonic stenosis.   Aorta: The aortic root and ascending aorta are structurally normal, with  no evidence of dilitation.   Venous: The inferior vena cava is normal in size with greater than 50%  respiratory variability, suggesting right atrial pressure of 3 mmHg.   IAS/Shunts: The atrial septum is grossly normal.   EKG: Rhythm strip during this exam demonstrates normal sinus rhythm.     LEFT VENTRICLE  PLAX 2D  LVIDd:         4.50 cm   Diastology  LVIDs:         2.90 cm   LV e' medial:   3.05 cm/s  LV PW:         0.90 cm    LV E/e' medial: 21.0  LV IVS:        0.90 cm  LVOT diam:     1.80 cm  LV SV:         55  LV SV Index:   31  LVOT Area:     2.54 cm     RIGHT VENTRICLE  RV S prime:     15.70 cm/s  TAPSE (M-mode): 2.1 cm   LEFT ATRIUM             Index        RIGHT ATRIUM           Index  LA diam:        3.90 cm 2.18 cm/m   RA Area:     12.50 cm  LA Vol (A2C):   89.9 ml 50.16 ml/m  RA Volume:   25.10 ml  14.00 ml/m  LA Vol (A4C):   53.0 ml 29.54 ml/m  LA Biplane Vol: 74.3 ml 41.45 ml/m   AORTIC VALVE  AV Area (Vmax):    0.67 cm  AV Area (Vmean):   0.75 cm  AV Area (VTI):     0.66 cm  AV Vmax:           420.00 cm/s  AV Vmean:          280.000 cm/s  AV VTI:            0.835 m  AV Peak Grad:      70.6 mmHg  AV Mean Grad:      38.0 mmHg  LVOT Vmax:         110.00 cm/s  LVOT Vmean:        82.000 cm/s  LVOT VTI:          0.218 m  LVOT/AV VTI ratio: 0.26    AORTA  Ao Root diam: 2.70 cm  Ao Asc diam:  3.10 cm   MITRAL VALVE                TRICUSPID VALVE  MV Area (PHT): 1.81 cm     TR Peak grad:   31.6 mmHg  MV Area VTI:   1.85 cm     TR Vmax:        281.00 cm/s  MV Peak grad:  9.9 mmHg  MV Mean grad:  3.8 mmHg     SHUNTS  MV Vmax:       1.57 m/s     Systemic VTI:  0.22 m  MV Vmean:      91.1 cm/s    Systemic Diam: 1.80 cm  MV Decel Time: 420 msec  MV E velocity: 64.20 cm/s  MV A velocity: 169.00 cm/s  MV E/A ratio:  0.38   Sunit Tolia DO  Electronically signed by Tessa Lerner DO  Signature Date/Time: 12/24/2022/10:01:49 PM        Final      Physicians  Panel Physicians Referring Physician Case Authorizing Physician  Tonny Bollman, MD (Primary)     Procedures  RIGHT HEART CATH AND CORONARY ANGIOGRAPHY   Conclusion  1.  Known severe aortic stenosis on noninvasive assessment 2.  Heavy mitral annular calcification 3.  Severe RCA stenosis (large dominant vessel) 4.  Severe distal left main/ostial circumflex stenosis 5.  Moderate eccentric proximal to mid LAD  stenosis   Recommendations: Surgical consultation for CABG/AVR   Indications  Severe aortic stenosis [I35.0 (ICD-10-CM)]   Procedural Details  Technical Details INDICATION: 72 year old woman diagnosed with severe symptomatic aortic stenosis with both exertional dyspnea and angina.  She has been noted to have progressive aortic stenosis by echo assessment.  She was seen in structural heart consultation and referred for preoperative cardiac catheterization.  PROCEDURAL DETAILS: There was an indwelling IV in a left antecubital vein. Using normal sterile technique, the IV was changed out for a 5 Fr brachial sheath over a 0.018 inch wire. The left wrist was then prepped, draped, and anesthetized with 1% lidocaine. Using the modified Seldinger technique a 5/6 French Slender sheath was placed in the left radial artery. Intra-arterial verapamil was administered through the radial artery sheath. IV heparin was administered after a JR4 catheter was advanced into the central aorta. A Swan-Ganz catheter was used for the right heart catheterization. Standard protocol was followed for recording of right heart pressures and sampling of oxygen saturations. Fick cardiac output was calculated. Standard Judkins  catheters were used for selective coronary angiography.  No attempt was made to cross the aortic valve.  There were no immediate procedural complications. The patient was transferred to the post catheterization recovery area for further monitoring.      Estimated blood loss <50 mL.   During this procedure medications were administered to achieve and maintain moderate conscious sedation while the patient's heart rate, blood pressure, and oxygen saturation were continuously monitored and I was present face-to-face 100% of this time.   Medications (Filter: Administrations occurring from [phone] to 0916 on 01/15/23) fentaNYL (SUBLIMAZE) injection (mcg)  Total dose: 50 mcg Date/Time Rate/Dose/Volume Action    01/15/23 0820 25 mcg Given   0835 25 mcg Given   midazolam (VERSED) injection (mg)  Total dose: 2 mg Date/Time Rate/Dose/Volume Action   01/15/23 0820 1 mg Given   0835 1 mg Given   Heparin (Porcine) in NaCl 2000-0.9 UNIT/L-% SOLN (mL)  Total volume: 1,000 mL Date/Time Rate/Dose/Volume Action   01/15/23 0821 1,000 mL Given   lidocaine (PF) (XYLOCAINE) 1 % injection (mL)  Total volume: 4 mL Date/Time Rate/Dose/Volume Action   01/15/23 0828 2 mL Given   0831 2 mL Given   Radial Cocktail/Verapamil only (mL)  Total volume: 10 mL Date/Time Rate/Dose/Volume Action   01/15/23 0834 10 mL Given   heparin sodium (porcine) injection (Units)  Total dose: 4,000 Units Date/Time Rate/Dose/Volume Action   01/15/23 0853 4,000 Units Given   iohexol (OMNIPAQUE) 350 MG/ML injection (mL)  Total volume: 25 mL Date/Time Rate/Dose/Volume Action   01/15/23 0900 25 mL Given    Sedation Time  Sedation Time Physician-1: 35 minutes 21 seconds Contrast     Administrations occurring from 0808 to 0916 on 01/15/23:  Medication Name Total Dose  iohexol (OMNIPAQUE) 350 MG/ML injection 25 mL   Radiation/Fluoro  Fluoro time: 3.6 (min) DAP: 12308 (mGycm2) Cumulative Air Kerma: 233 (mGy) Complications  Complications documented before study signed (01/15/2023  9:22 AM)   No complications were associated with this study.  Documented by Bethanie Dicker D - 01/15/2023  8:57 AM     Coronary Findings  Diagnostic Dominance: Right Left Main  Mid LM to Dist LM lesion is 50% stenosed. The lesion is eccentric. The lesion is calcified.    Left Anterior Descending  Ost LAD to Prox LAD lesion is 70% stenosed. The lesion is eccentric.  Prox LAD to Mid LAD lesion is 50% stenosed.    Left Circumflex  The circumflex has a tight ostial stenosis involving an eccentric left main lesion extending into the circumflex ostium. The circumflex supplies a large OM branch that has mild plaquing but no significant  stenosis.  Ost Cx to Prox Cx lesion is 80% stenosed.  Mid Cx lesion is 50% stenosed.    Right Coronary Artery  The right coronary artery is a large, dominant vessel. It supplies a twin PDA and a large posterolateral branch. The vessel is diffusely calcified. There is aortic calcification at the ostium of the RCA. The mid vessel has 70% stenosis followed by a tight 90 to 95% stenosis  Prox RCA lesion is 70% stenosed.  Mid RCA lesion is 90% stenosed. The lesion is calcified.    Intervention   No interventions have been documented.   Coronary Diagrams  Diagnostic Dominance: Right  Intervention   Implants   No implant documentation for this case.   Syngo Images   Show images for CARDIAC CATHETERIZATION Images on Long Term Storage   Show images for [place],  Elvia Collum to Procedure Log  Procedure Log   Link to Procedure Log  Procedure Log    Hemo Data  Flowsheet Row Most Recent Value  Fick Cardiac Output 4.83 L/min  Fick Cardiac Output Index 2.86 (L/min)/BSA  RA A Wave 8 mmHg  RA V Wave 6 mmHg  RA Mean 6 mmHg  RV Systolic Pressure 37 mmHg  RV Diastolic Pressure -1 mmHg  RV EDP 8 mmHg  PA Systolic Pressure 34 mmHg  PA Diastolic Pressure 10 mmHg  PA Mean 20 mmHg  PW A Wave 10 mmHg  PW V Wave 9 mmHg  PW Mean 8 mmHg  AO Systolic Pressure 136 mmHg  AO Diastolic Pressure 66 mmHg  AO Mean 92 mmHg  QP/QS 1  TPVR Index 7 HRUI  TSVR Index 32.22 HRUI  PVR SVR Ratio 0.14  TPVR/TSVR Ratio 0.22    Impression:   Plan:   Alleen Borne, MD [facility] [phone]

## 2023-01-18 NOTE — Telephone Encounter (Signed)
Darlene Kelly was in clinic on this day but I was not working with her, not sure why msg was not routed to her covering for that day as I was with my primary provider.  She is not back in the office until Monday 01/21/23, I will pass them along to her at that time if she has not already reviewed them.

## 2023-01-28 NOTE — Pre-Procedure Instructions (Signed)
Surgical Instructions   Your procedure is scheduled on January 31, 2023. Report to [facility] Main Entrance "A" at 5:30 A.M., then check in with the Admitting office. Any questions or running late day of surgery: call [phone]  Questions prior to your surgery date: call [phone], Monday-Friday, 8am-4pm. If you experience any cold or flu symptoms such as cough, fever, chills, shortness of breath, etc. between now and your scheduled surgery, please notify us at the above number.     Remember:  Do not eat or drink after midnight the night before your surgery    Take these medicines the morning of surgery with A SIP OF WATER: rosuvastatin (CRESTOR)   Continue taking your Aspirin through the day before surgery. DO NOT take any the morning of surgery.   One week prior to surgery, STOP taking any Aleve, Naproxen, Ibuprofen, Motrin, Advil, Goody's, BC's, all herbal medications, fish oil, and non-prescription vitamins.   WHAT DO I DO ABOUT MY DIABETES MEDICATION?   STOP taking your metFORMIN (GLUCOPHAGE-XR) two days prior to surgery. Your last dose will be October 28th.  STOP taking your OZEMPIC one week prior to surgery. Do not take any doses after October 23rd.   HOW TO MANAGE YOUR DIABETES BEFORE AND AFTER SURGERY  Why is it important to control my blood sugar before and after surgery? Improving blood sugar levels before and after surgery helps healing and can limit problems. A way of improving blood sugar control is eating a healthy diet by:  Eating less sugar and carbohydrates  Increasing activity/exercise  Talking with your doctor about reaching your blood sugar goals High blood sugars (greater than 180 mg/dL) can raise your risk of infections and slow your recovery, so you will need to focus on controlling your diabetes during the weeks before surgery. Make sure that the doctor who takes care of your diabetes knows about your planned surgery including the date and  location.  How do I manage my blood sugar before surgery? Check your blood sugar at least 4 times a day, starting 2 days before surgery, to make sure that the level is not too high or low.  Check your blood sugar the morning of your surgery when you wake up and every 2 hours until you get to the Short Stay unit.  If your blood sugar is less than 70 mg/dL, you will need to treat for low blood sugar: Do not take insulin. Treat a low blood sugar (less than 70 mg/dL) with  cup of clear juice (cranberry or apple), 4 glucose tablets, OR glucose gel. Recheck blood sugar in 15 minutes after treatment (to make sure it is greater than 70 mg/dL). If your blood sugar is not greater than 70 mg/dL on recheck, call [phone] for further instructions. Report your blood sugar to the short stay nurse when you get to Short Stay.  If you are admitted to the hospital after surgery: Your blood sugar will be checked by the staff and you will probably be given insulin after surgery (instead of oral diabetes medicines) to make sure you have good blood sugar levels. The goal for blood sugar control after surgery is 80-180 mg/dL.                      Do NOT Smoke (Tobacco/Vaping) for 24 hours prior to your procedure.  If you use a CPAP at night, you may bring your mask/headgear for your overnight stay.   You will be asked to  remove any contacts, glasses, piercing's, hearing aid's, dentures/partials prior to surgery. Please bring cases for these items if needed.    Patients discharged the day of surgery will not be allowed to drive home, and someone needs to stay with them for 24 hours.  SURGICAL WAITING ROOM VISITATION Patients may have no more than 2 support people in the waiting area - these visitors may rotate.   Pre-op nurse will coordinate an appropriate time for 1 ADULT support person, who may not rotate, to accompany patient in pre-op.  Children under the age of 22 must have an adult with them who is  not the patient and must remain in the main waiting area with an adult.  If the patient needs to stay at the hospital during part of their recovery, the visitor guidelines for inpatient rooms apply.  Please refer to the [facility] website for the visitor guidelines for any additional information.   If you received a COVID test during your pre-op visit  it is requested that you wear a mask when out in public, stay away from anyone that may not be feeling well and notify your surgeon if you develop symptoms. If you have been in contact with anyone that has tested positive in the last 10 days please notify you surgeon.      Pre-operative CHG Bathing Instructions   You can play a key role in reducing the risk of infection after surgery. Your skin needs to be as free of germs as possible. You can reduce the number of germs on your skin by washing with CHG (chlorhexidine gluconate) soap before surgery. CHG is an antiseptic soap that kills germs and continues to kill germs even after washing.   DO NOT use if you have an allergy to chlorhexidine/CHG or antibacterial soaps. If your skin becomes reddened or irritated, stop using the CHG and notify one of our RNs at [phone].              TAKE A SHOWER THE NIGHT BEFORE SURGERY AND THE DAY OF SURGERY    Please keep in mind the following:  DO NOT shave, including legs and underarms, 48 hours prior to surgery.   You may shave your face before/day of surgery.  Place clean sheets on your bed the night before surgery Use a clean washcloth (not used since being washed) for each shower. DO NOT sleep with pet's night before surgery.  CHG Shower Instructions:  Wash your face and private area with normal soap. If you choose to wash your hair, wash first with your normal shampoo.  After you use shampoo/soap, rinse your hair and body thoroughly to remove shampoo/soap residue.  Turn the water OFF and apply half the bottle of CHG soap to a CLEAN washcloth.   Apply CHG soap ONLY FROM YOUR NECK DOWN TO YOUR TOES (washing for 3-5 minutes)  DO NOT use CHG soap on face, private areas, open wounds, or sores.  Pay special attention to the area where your surgery is being performed.  If you are having back surgery, having someone wash your back for you may be helpful. Wait 2 minutes after CHG soap is applied, then you may rinse off the CHG soap.  Pat dry with a clean towel  Put on clean pajamas    Additional instructions for the day of surgery: DO NOT APPLY any lotions, deodorants, cologne, or perfumes.   Do not wear jewelry or makeup Do not wear nail polish, gel polish, artificial nails,  or any other type of covering on natural nails (fingers and toes) Do not bring valuables to the hospital. [facility] is not responsible for valuables/personal belongings. Put on clean/comfortable clothes.  Please brush your teeth.  Ask your nurse before applying any prescription medications to the skin.

## 2023-01-29 ENCOUNTER — Ambulatory Visit
Admission: RE | Admit: 2023-01-29 | Discharge: 2023-01-29 | Disposition: A | Discharge status: Home / Self Care | Payer: Medicare Other | Source: Ambulatory Visit | Attending: Surgery | Admitting: Surgery

## 2023-01-29 ENCOUNTER — Other Ambulatory Visit: Payer: Self-pay

## 2023-01-29 ENCOUNTER — Encounter (HOSPITAL_COMMUNITY): Payer: Self-pay

## 2023-01-29 ENCOUNTER — Encounter (HOSPITAL_COMMUNITY)
Admission: RE | Admit: 2023-01-29 | Discharge: 2023-01-29 | Disposition: A | Discharge status: Home / Self Care | Payer: Medicare Other | Source: Ambulatory Visit | Attending: Surgery | Admitting: Surgery

## 2023-01-29 ENCOUNTER — Ambulatory Visit (HOSPITAL_COMMUNITY)
Admission: RE | Admit: 2023-01-29 | Discharge: 2023-01-29 | Disposition: A | Discharge status: Home / Self Care | Payer: Medicare Other | Source: Ambulatory Visit | Attending: Surgery | Admitting: Surgery

## 2023-01-29 ENCOUNTER — Other Ambulatory Visit (HOSPITAL_COMMUNITY): Payer: PRIVATE HEALTH INSURANCE

## 2023-01-29 VITALS — BP 140/76 | HR 89 | Temp 98.4°F | Resp 18 | Ht 61.0 in | Wt 151.0 lb

## 2023-01-29 DIAGNOSIS — I2510 Atherosclerotic heart disease of native coronary artery without angina pectoris: Secondary | ICD-10-CM | POA: Insufficient documentation

## 2023-01-29 DIAGNOSIS — Z9889 Other specified postprocedural states: Secondary | ICD-10-CM | POA: Insufficient documentation

## 2023-01-29 DIAGNOSIS — I350 Nonrheumatic aortic (valve) stenosis: Secondary | ICD-10-CM | POA: Insufficient documentation

## 2023-01-29 DIAGNOSIS — M8588 Other specified disorders of bone density and structure, other site: Secondary | ICD-10-CM | POA: Insufficient documentation

## 2023-01-29 DIAGNOSIS — E119 Type 2 diabetes mellitus without complications: Secondary | ICD-10-CM | POA: Insufficient documentation

## 2023-01-29 DIAGNOSIS — K449 Diaphragmatic hernia without obstruction or gangrene: Secondary | ICD-10-CM | POA: Insufficient documentation

## 2023-01-29 DIAGNOSIS — Z7984 Long term (current) use of oral hypoglycemic drugs: Secondary | ICD-10-CM | POA: Insufficient documentation

## 2023-01-29 DIAGNOSIS — Z1152 Encounter for screening for COVID-19: Secondary | ICD-10-CM | POA: Insufficient documentation

## 2023-01-29 DIAGNOSIS — Z01818 Encounter for other preprocedural examination: Secondary | ICD-10-CM | POA: Insufficient documentation

## 2023-01-29 DIAGNOSIS — I700 Atherosclerosis of aorta: Secondary | ICD-10-CM | POA: Insufficient documentation

## 2023-01-29 DIAGNOSIS — Z79899 Other long term (current) drug therapy: Secondary | ICD-10-CM | POA: Insufficient documentation

## 2023-01-29 HISTORY — DX: Atherosclerotic heart disease of native coronary artery without angina pectoris: I25.10

## 2023-01-29 LAB — CBC
HCT: 39.9 % (ref 36.0–46.0)
Hemoglobin: 13.4 g/dL (ref 12.0–15.0)
MCH: 30.4 pg (ref 26.0–34.0)
MCHC: 33.6 g/dL (ref 30.0–36.0)
MCV: 90.5 fL (ref 80.0–100.0)
Platelets: 244 10*3/uL (ref 150–400)
RBC: 4.41 MIL/uL (ref 3.87–5.11)
RDW: 14.2 % (ref 11.5–15.5)
WBC: 9 10*3/uL (ref 4.0–10.5)
nRBC: 0 % (ref 0.0–0.2)

## 2023-01-29 LAB — URINALYSIS, ROUTINE W REFLEX MICROSCOPIC
Bilirubin Urine: NEGATIVE
Glucose, UA: NEGATIVE mg/dL
Ketones, ur: NEGATIVE mg/dL
Nitrite: NEGATIVE
Protein, ur: 100 mg/dL — AB
Specific Gravity, Urine: >1.046 — ABNORMAL HIGH (ref 1.005–1.030)
pH: 6 (ref 5.0–8.0)

## 2023-01-29 LAB — COMPREHENSIVE METABOLIC PANEL
ALT: 24 U/L (ref 0–44)
AST: 27 U/L (ref 15–41)
Albumin: 4.1 g/dL (ref 3.5–5.0)
Alkaline Phosphatase: 68 U/L (ref 38–126)
Anion gap: 12 (ref 5–15)
BUN: 14 mg/dL (ref 8–23)
CO2: 27 mmol/L (ref 22–32)
Calcium: 10.5 mg/dL — ABNORMAL HIGH (ref 8.9–10.3)
Chloride: 102 mmol/L (ref 98–111)
Creatinine, Ser: 1.22 mg/dL — ABNORMAL HIGH (ref 0.44–1.00)
GFR, Estimated: 47 mL/min — ABNORMAL LOW (ref >60)
Glucose, Bld: 192 mg/dL — ABNORMAL HIGH (ref 70–99)
Potassium: 2.9 mmol/L — ABNORMAL LOW (ref 3.5–5.1)
Sodium: 141 mmol/L (ref 135–145)
Total Bilirubin: 0.6 mg/dL (ref 0.3–1.2)
Total Protein: 7.3 g/dL (ref 6.5–8.1)

## 2023-01-29 LAB — SURGICAL PCR SCREEN
MRSA, PCR: NEGATIVE
Staphylococcus aureus: NEGATIVE

## 2023-01-29 LAB — HEMOGLOBIN A1C
Hgb A1c MFr Bld: 6.1 % — ABNORMAL HIGH (ref 4.8–5.6)
Mean Plasma Glucose: 128.37 mg/dL

## 2023-01-29 LAB — PROTIME-INR
INR: 1 (ref 0.8–1.2)
Prothrombin Time: 13.3 s (ref 11.4–15.2)

## 2023-01-29 LAB — SARS CORONAVIRUS 2 (TAT 6-24 HRS): SARS Coronavirus 2: NEGATIVE

## 2023-01-29 LAB — GLUCOSE, CAPILLARY: Glucose-Capillary: 200 mg/dL — ABNORMAL HIGH (ref 70–99)

## 2023-01-29 LAB — APTT: aPTT: 25 s (ref 24–36)

## 2023-01-29 MED ORDER — IOPAMIDOL (ISOVUE-300) INJECTION 61%
500.0000 mL | Freq: Once | INTRAVENOUS | Status: AC | PRN
  Administered 2023-01-29: 95 mL via INTRAVENOUS

## 2023-01-29 NOTE — Progress Notes (Signed)
Unable to collect ABG at pre-op appointment. Will need to be collected DOS. Order placed.

## 2023-01-29 NOTE — Progress Notes (Addendum)
PCP - Trisha Mangle Cardiologist - Sunit Tolia, DO (Pt is in the process of changing to Dr. Sharol Harness) Last office visit 01/09/2023  PPM/ICD - Denies Device Orders - n/a Rep Notified - n/a  Chest x-ray - 01/29/2023 EKG - 01/11/2023 Stress Test - 01/12/2019 ECHO - 12/19/2022 Cardiac Cath - 01/15/2023  Sleep Study - Denies CPAP - n/a  Pt is DM2. She checks her blood sugar a few times per week. Normal fasting result is 110s. CBG at pre-op 200. Pt had a [place] sandwich on [employer], a few chips, 1/2 pickle and a mint. A1c result pending.   Last dose of GLP1 agonist- Last dose of Ozempic October 18th. GLP1 instructions: Pt instructed to not take any additional doses prior to surgery  Blood Thinner Instructions: n/a Aspirin Instructions: n/a  NPO after midnight  COVID TEST- Yes. Result pending.   Anesthesia review: Yes. CAD, Murmur and Hx of HTN (recently weaned herself off BP meds due to low SBP in 70s), DM on GLP-1 with high glucose at PAT appointment. Potassium results abnormal and office notified.  Patient denies shortness of breath, fever, cough and chest pain at PAT appointment. Pt denies any respiratory illness/infection in the last two months.    All instructions explained to the patient, with a verbal understanding of the material. Patient agrees to go over the instructions while at home for a better understanding. Patient also instructed to self quarantine after being tested for COVID-19. The opportunity to ask questions was provided.

## 2023-01-29 NOTE — Progress Notes (Signed)
Darius Bump, RN with [facility] office made aware of potassium and UA results.

## 2023-01-30 ENCOUNTER — Other Ambulatory Visit: Payer: Self-pay | Admitting: *Deleted

## 2023-01-30 MED ORDER — PLASMA-LYTE A IV SOLN
INTRAVENOUS | Status: DC
  Filled 2023-01-30: qty 2.5

## 2023-01-30 MED ORDER — NOREPINEPHRINE 4 MG/250ML-% IV SOLN
0.0000 ug/min | INTRAVENOUS | Status: DC
  Filled 2023-01-30: qty 250

## 2023-01-30 MED ORDER — TRANEXAMIC ACID 1000 MG/10ML IV SOLN
1.5000 mg/kg/h | INTRAVENOUS | Status: AC
  Administered 2023-01-31: 1.5 mg/kg/h via INTRAVENOUS
  Filled 2023-01-30: qty 25

## 2023-01-30 MED ORDER — MILRINONE LACTATE IN DEXTROSE 20-5 MG/100ML-% IV SOLN
0.3000 ug/kg/min | INTRAVENOUS | Status: DC
  Filled 2023-01-30: qty 100

## 2023-01-30 MED ORDER — EPINEPHRINE HCL 5 MG/250ML IV SOLN IN NS
0.0000 ug/min | INTRAVENOUS | Status: DC
  Filled 2023-01-30: qty 250

## 2023-01-30 MED ORDER — POTASSIUM CHLORIDE CRYS ER 20 MEQ PO TBCR: 40.0000 meq | EXTENDED_RELEASE_TABLET | Freq: Two times a day (BID) | ORAL | 0 refills | Status: DC

## 2023-01-30 MED ORDER — HEPARIN 30,000 UNITS/1000 ML (OHS) CELLSAVER SOLUTION
Status: DC
  Filled 2023-01-30: qty 1000

## 2023-01-30 MED ORDER — INSULIN REGULAR(HUMAN) IN NACL 100-0.9 UT/100ML-% IV SOLN
INTRAVENOUS | Status: AC
  Administered 2023-01-31: 2 [IU]/h via INTRAVENOUS
  Filled 2023-01-30: qty 100

## 2023-01-30 MED ORDER — CEFAZOLIN SODIUM-DEXTROSE 2-4 GM/100ML-% IV SOLN
2.0000 g | INTRAVENOUS | Status: AC
  Administered 2023-01-31 (×2): 2 g via INTRAVENOUS
  Filled 2023-01-30: qty 100

## 2023-01-30 MED ORDER — CEFAZOLIN SODIUM-DEXTROSE 2-4 GM/100ML-% IV SOLN
2.0000 g | INTRAVENOUS | Status: DC
  Filled 2023-01-30: qty 100

## 2023-01-30 MED ORDER — MANNITOL 20 % IV SOLN
INTRAVENOUS | Status: DC
  Filled 2023-01-30: qty 13

## 2023-01-30 MED ORDER — NITROGLYCERIN IN D5W 200-5 MCG/ML-% IV SOLN
2.0000 ug/min | INTRAVENOUS | Status: DC
  Filled 2023-01-30: qty 250

## 2023-01-30 MED ORDER — PHENYLEPHRINE HCL-NACL 20-0.9 MG/250ML-% IV SOLN
30.0000 ug/min | INTRAVENOUS | Status: AC
  Administered 2023-01-31: 50 ug/min via INTRAVENOUS
  Filled 2023-01-30: qty 250

## 2023-01-30 MED ORDER — TRANEXAMIC ACID (OHS) PUMP PRIME SOLUTION
2.0000 mg/kg | INTRAVENOUS | Status: DC
  Filled 2023-01-30: qty 1.37

## 2023-01-30 MED ORDER — POTASSIUM CHLORIDE 2 MEQ/ML IV SOLN
80.0000 meq | INTRAVENOUS | Status: DC
  Filled 2023-01-30: qty 40

## 2023-01-30 MED ORDER — VANCOMYCIN HCL 1250 MG/250ML IV SOLN
1250.0000 mg | INTRAVENOUS | Status: AC
  Administered 2023-01-31: 1250 mg via INTRAVENOUS
  Filled 2023-01-30: qty 250

## 2023-01-30 MED ORDER — DEXMEDETOMIDINE HCL IN NACL 400 MCG/100ML IV SOLN
0.1000 ug/kg/h | INTRAVENOUS | Status: AC
  Administered 2023-01-31: .4 ug/kg/h via INTRAVENOUS
  Filled 2023-01-30: qty 100

## 2023-01-30 MED ORDER — TRANEXAMIC ACID (OHS) BOLUS VIA INFUSION
15.0000 mg/kg | INTRAVENOUS | Status: AC
  Administered 2023-01-31: 1027.5 mg via INTRAVENOUS
  Filled 2023-01-30: qty 1028

## 2023-01-30 NOTE — Hospital Course (Addendum)
HPI: The patient is a 73 year old woman with history of hypertension, diabetes, right breast carcinoma status post chemotherapy and radiation therapy in 2016, a remote history of rheumatic fever as a child, and moderate aortic stenosis that has been followed by Dr. Odis Hollingshead.  She has developed progressively worsening exertional shortness of breath and fatigue as well as exertional chest discomfort radiating into her arms over the past several months.  She walks daily for about 1-1/2 miles with her husband and dogs and frequently gets symptoms that improve with rest.  She has had occasional episodes of chest discomfort while lying in bed.  She has had some lightheadedness but no syncope.  Her most recent echocardiogram showed an ejection fraction of 60 to 65% with grade 2 diastolic dysfunction.  There was an increase in the mean aortic valve gradient to 38 mmHg with a peak velocity of 4.2 m/s.  Dimensionless index was 0.26 with a valve area of 0.66 cm and a low stroke-volume index of 31.  She was seen by Dr. [name] for consideration of treatment of her severe aortic stenosis and underwent cardiac catheterization which showed severe distal left main and three-vessel coronary disease. CTA of aorta done 01/29/2023 showed no evidence of aortic enlargement.  Dr. Laneta Simmers discussed the need for aortic valve replacement (using a bioprosthetic valve) and coronary artery bypass grafting surgery. Potential risks, benefits, and complications of the surgery were discussed with the patient and she agreed to proceed with surgery.  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 She was transported from the OR to [facility] ICU. She was extubated the evening of surgery.  She was weaned off Neo-synephrine as hemodynamics allowed.  She was not initially started on lopressor.  Her arterial line and swan ganz catheter were removed without difficulty.  She was volume overloaded and Lasix and potassium  were initiated.  She did develop postoperative atrial fibrillation and was started on amiodarone protocol.  She has subsequently been chemically cardioverted to normal sinus rhythm and transition to oral dosing.  On postop day #2 she was transferred to the floor.  Oxygen was weaned off by the third post op day.   She is tolerating gradually increasing activities using standard post cardiac surgical protocols.  Incisions are noted to be healing well without evidence of infection.  She is tolerating diet.  She developed acute renal insufficiency postoperatively with creatinine gradually trending up from a baseline of 0.84 to 3.18 on post-op day 5.  Urine output remained adequate and electrolytes were controlled. Diuretics were discontinued.  BP remained stable in the 100-120  systolic range.  Ms. Minder had recurrent atrial fibrillation on post-op day 5. She converted back to NSR after an additional IV bolus of amiodarone.  Anticoagulation with Coumadin was initiated and the INR was monitored daily.  She will be discharged home on 2 mg of coumadin.  Her INR is 1.9 with a goal of 2.0.  She is ambulating independently.  Her surgical incisions are free from signs of infection.  She is stable for discharge home today.

## 2023-01-30 NOTE — Progress Notes (Signed)
Per Dr. Laneta Simmers, 40 mEq of potassium sent to patient's preferred pharmacy to be taken once this AM and once this PM for low K+ at PAT.

## 2023-01-30 NOTE — Anesthesia Preprocedure Evaluation (Addendum)
Anesthesia Evaluation  Patient identified by MRN, date of birth, ID band Patient awake    Reviewed: Allergy & Precautions, NPO status , Patient's Chart, lab work & pertinent test results  History of Anesthesia Complications Negative for: history of anesthetic complications  Airway Mallampati: II  TM Distance: >3 FB Neck ROM: Full    Dental  (+) Dental Advisory Given   Pulmonary former smoker   breath sounds clear to auscultation       Cardiovascular hypertension, (-) angina + CAD (severe L main stenosis, seere RCA stenosis, severe Cx stenosis)  + Valvular Problems/Murmurs (severe) AS  Rhythm:Regular Rate:Normal + Systolic murmurs 04/6107 ECHO: EF 60-65%, Grade 2 DD, normal RVF, mild MR, severe AS with peak grad 71 mmHg, mean grad 38 mmHg   Neuro/Psych negative neurological ROS     GI/Hepatic negative GI ROS, Neg liver ROS,,,  Endo/Other  diabetes, Oral Hypoglycemic AgentsHypothyroidism  Ozempic: 8d ago  Renal/GU Renal InsufficiencyRenal disease     Musculoskeletal  (+) Arthritis ,    Abdominal   Peds  Hematology   Anesthesia Other Findings H/o breast cancer  Reproductive/Obstetrics                             Anesthesia Physical Anesthesia Plan  ASA: 4  Anesthesia Plan: General   Post-op Pain Management:    Induction: Intravenous  PONV Risk Score and Plan: 3 and Treatment may vary due to age or medical condition  Airway Management Planned: Oral ETT  Additional Equipment: Arterial line, PA Cath and TEE  Intra-op Plan:   Post-operative Plan: Post-operative intubation/ventilation  Informed Consent: I have reviewed the patients History and Physical, chart, labs and discussed the procedure including the risks, benefits and alternatives for the proposed anesthesia with the patient or authorized representative who has indicated his/her understanding and acceptance.     Dental  advisory given  Plan Discussed with: CRNA and Surgeon  Anesthesia Plan Comments:        Anesthesia Quick Evaluation

## 2023-01-31 ENCOUNTER — Encounter (HOSPITAL_COMMUNITY)
Admission: RE | Disposition: A | Discharge status: Home / Self Care | Payer: Self-pay | Source: Home / Self Care | Attending: Surgery

## 2023-01-31 ENCOUNTER — Other Ambulatory Visit: Payer: Self-pay

## 2023-01-31 ENCOUNTER — Inpatient Hospital Stay (HOSPITAL_COMMUNITY)
Admission: RE | Admit: 2023-01-31 | Discharge: 2023-02-08 | DRG: 220 | Disposition: A | Discharge status: Home Health | Payer: Medicare Other | Attending: Surgery | Admitting: Surgery

## 2023-01-31 ENCOUNTER — Inpatient Hospital Stay (HOSPITAL_COMMUNITY): Payer: Medicare Other

## 2023-01-31 ENCOUNTER — Other Ambulatory Visit: Payer: Self-pay | Admitting: Cardiology

## 2023-01-31 ENCOUNTER — Inpatient Hospital Stay (HOSPITAL_COMMUNITY): Payer: Medicare Other | Admitting: Certified Registered Nurse Anesthetist

## 2023-01-31 ENCOUNTER — Encounter (HOSPITAL_COMMUNITY): Payer: Self-pay | Admitting: Surgery

## 2023-01-31 ENCOUNTER — Inpatient Hospital Stay (HOSPITAL_COMMUNITY): Payer: Medicare Other | Admitting: Physician Assistant

## 2023-01-31 DIAGNOSIS — Z7901 Long term (current) use of anticoagulants: Secondary | ICD-10-CM

## 2023-01-31 DIAGNOSIS — Z853 Personal history of malignant neoplasm of breast: Secondary | ICD-10-CM

## 2023-01-31 DIAGNOSIS — Z801 Family history of malignant neoplasm of trachea, bronchus and lung: Secondary | ICD-10-CM

## 2023-01-31 DIAGNOSIS — I493 Ventricular premature depolarization: Secondary | ICD-10-CM | POA: Diagnosis not present

## 2023-01-31 DIAGNOSIS — I350 Nonrheumatic aortic (valve) stenosis: Secondary | ICD-10-CM

## 2023-01-31 DIAGNOSIS — Z9221 Personal history of antineoplastic chemotherapy: Secondary | ICD-10-CM | POA: Diagnosis not present

## 2023-01-31 DIAGNOSIS — Z85828 Personal history of other malignant neoplasm of skin: Secondary | ICD-10-CM

## 2023-01-31 DIAGNOSIS — M8580 Other specified disorders of bone density and structure, unspecified site: Secondary | ICD-10-CM | POA: Diagnosis present

## 2023-01-31 DIAGNOSIS — Z91040 Latex allergy status: Secondary | ICD-10-CM

## 2023-01-31 DIAGNOSIS — I480 Paroxysmal atrial fibrillation: Secondary | ICD-10-CM | POA: Diagnosis not present

## 2023-01-31 DIAGNOSIS — T502X5A Adverse effect of carbonic-anhydrase inhibitors, benzothiadiazides and other diuretics, initial encounter: Secondary | ICD-10-CM | POA: Diagnosis not present

## 2023-01-31 DIAGNOSIS — I4891 Unspecified atrial fibrillation: Secondary | ICD-10-CM

## 2023-01-31 DIAGNOSIS — Z951 Presence of aortocoronary bypass graft: Secondary | ICD-10-CM

## 2023-01-31 DIAGNOSIS — Z96652 Presence of left artificial knee joint: Secondary | ICD-10-CM | POA: Diagnosis present

## 2023-01-31 DIAGNOSIS — Z87891 Personal history of nicotine dependence: Secondary | ICD-10-CM | POA: Diagnosis not present

## 2023-01-31 DIAGNOSIS — N179 Acute kidney failure, unspecified: Secondary | ICD-10-CM | POA: Diagnosis not present

## 2023-01-31 DIAGNOSIS — E7800 Pure hypercholesterolemia, unspecified: Secondary | ICD-10-CM | POA: Diagnosis present

## 2023-01-31 DIAGNOSIS — Z8249 Family history of ischemic heart disease and other diseases of the circulatory system: Secondary | ICD-10-CM

## 2023-01-31 DIAGNOSIS — Z952 Presence of prosthetic heart valve: Secondary | ICD-10-CM

## 2023-01-31 DIAGNOSIS — Z1152 Encounter for screening for COVID-19: Secondary | ICD-10-CM | POA: Diagnosis not present

## 2023-01-31 DIAGNOSIS — I10 Essential (primary) hypertension: Secondary | ICD-10-CM | POA: Diagnosis present

## 2023-01-31 DIAGNOSIS — Z79899 Other long term (current) drug therapy: Secondary | ICD-10-CM | POA: Diagnosis not present

## 2023-01-31 DIAGNOSIS — I060 Rheumatic aortic stenosis: Principal | ICD-10-CM | POA: Diagnosis present

## 2023-01-31 DIAGNOSIS — Z888 Allergy status to other drugs, medicaments and biological substances status: Secondary | ICD-10-CM

## 2023-01-31 DIAGNOSIS — E119 Type 2 diabetes mellitus without complications: Secondary | ICD-10-CM | POA: Diagnosis present

## 2023-01-31 DIAGNOSIS — Z8619 Personal history of other infectious and parasitic diseases: Secondary | ICD-10-CM

## 2023-01-31 DIAGNOSIS — I2510 Atherosclerotic heart disease of native coronary artery without angina pectoris: Secondary | ICD-10-CM | POA: Diagnosis present

## 2023-01-31 DIAGNOSIS — E876 Hypokalemia: Secondary | ICD-10-CM | POA: Diagnosis not present

## 2023-01-31 DIAGNOSIS — Z923 Personal history of irradiation: Secondary | ICD-10-CM

## 2023-01-31 DIAGNOSIS — Z7982 Long term (current) use of aspirin: Secondary | ICD-10-CM | POA: Diagnosis not present

## 2023-01-31 DIAGNOSIS — Z7984 Long term (current) use of oral hypoglycemic drugs: Secondary | ICD-10-CM

## 2023-01-31 DIAGNOSIS — E8770 Fluid overload, unspecified: Secondary | ICD-10-CM | POA: Diagnosis not present

## 2023-01-31 DIAGNOSIS — Z7985 Long-term (current) use of injectable non-insulin antidiabetic drugs: Secondary | ICD-10-CM

## 2023-01-31 HISTORY — PX: CORONARY ARTERY BYPASS GRAFT: SHX141

## 2023-01-31 HISTORY — PX: AORTIC VALVE REPLACEMENT: SHX41

## 2023-01-31 HISTORY — PX: TEE WITHOUT CARDIOVERSION: SHX5443

## 2023-01-31 LAB — POCT I-STAT 7, (LYTES, BLD GAS, ICA,H+H)
Acid-base deficit: 2 mmol/L (ref 0.0–2.0)
Acid-base deficit: 4 mmol/L — ABNORMAL HIGH (ref 0.0–2.0)
Acid-base deficit: 6 mmol/L — ABNORMAL HIGH (ref 0.0–2.0)
Acid-base deficit: 4 mmol/L — ABNORMAL HIGH (ref 0.0–2.0)
Acid-base deficit: 1 mmol/L (ref 0.0–2.0)
Bicarbonate: 22.9 mmol/L (ref 20.0–28.0)
Bicarbonate: 21.2 mmol/L (ref 20.0–28.0)
Bicarbonate: 20.9 mmol/L (ref 20.0–28.0)
Bicarbonate: 21.2 mmol/L (ref 20.0–28.0)
Bicarbonate: 24.3 mmol/L (ref 20.0–28.0)
Calcium, Ion: 1.09 mmol/L — ABNORMAL LOW (ref 1.15–1.40)
Calcium, Ion: 1.05 mmol/L — ABNORMAL LOW (ref 1.15–1.40)
Calcium, Ion: 1.12 mmol/L — ABNORMAL LOW (ref 1.15–1.40)
Calcium, Ion: 1.1 mmol/L — ABNORMAL LOW (ref 1.15–1.40)
Calcium, Ion: 1.12 mmol/L — ABNORMAL LOW (ref 1.15–1.40)
HCT: 23 % — ABNORMAL LOW (ref 36.0–46.0)
HCT: 24 % — ABNORMAL LOW (ref 36.0–46.0)
HCT: 35 % — ABNORMAL LOW (ref 36.0–46.0)
HCT: 32 % — ABNORMAL LOW (ref 36.0–46.0)
HCT: 31 % — ABNORMAL LOW (ref 36.0–46.0)
Hemoglobin: 7.8 g/dL — ABNORMAL LOW (ref 12.0–15.0)
Hemoglobin: 8.2 g/dL — ABNORMAL LOW (ref 12.0–15.0)
Hemoglobin: 11.9 g/dL — ABNORMAL LOW (ref 12.0–15.0)
Hemoglobin: 10.9 g/dL — ABNORMAL LOW (ref 12.0–15.0)
Hemoglobin: 10.5 g/dL — ABNORMAL LOW (ref 12.0–15.0)
O2 Saturation: 100 %
O2 Saturation: 100 %
O2 Saturation: 95 %
O2 Saturation: 97 %
O2 Saturation: 97 %
Patient temperature: 36.7
Patient temperature: 36.4
Patient temperature: 36.7
Potassium: 3.1 mmol/L — ABNORMAL LOW (ref 3.5–5.1)
Potassium: 3.6 mmol/L (ref 3.5–5.1)
Potassium: 3.2 mmol/L — ABNORMAL LOW (ref 3.5–5.1)
Potassium: 3.6 mmol/L (ref 3.5–5.1)
Potassium: 3.9 mmol/L (ref 3.5–5.1)
Sodium: 139 mmol/L (ref 135–145)
Sodium: 137 mmol/L (ref 135–145)
Sodium: 141 mmol/L (ref 135–145)
Sodium: 141 mmol/L (ref 135–145)
Sodium: 142 mmol/L (ref 135–145)
TCO2: 24 mmol/L (ref 22–32)
TCO2: 22 mmol/L (ref 22–32)
TCO2: 22 mmol/L (ref 22–32)
TCO2: 22 mmol/L (ref 22–32)
TCO2: 25 mmol/L (ref 22–32)
pCO2 arterial: 39.3 mm[Hg] (ref 32–48)
pCO2 arterial: 36.9 mm[Hg] (ref 32–48)
pCO2 arterial: 44.9 mm[Hg] (ref 32–48)
pCO2 arterial: 36.1 mm[Hg] (ref 32–48)
pCO2 arterial: 40.7 mm[Hg] (ref 32–48)
pH, Arterial: 7.374 (ref 7.35–7.45)
pH, Arterial: 7.368 (ref 7.35–7.45)
pH, Arterial: 7.274 — ABNORMAL LOW (ref 7.35–7.45)
pH, Arterial: 7.375 (ref 7.35–7.45)
pH, Arterial: 7.382 (ref 7.35–7.45)
pO2, Arterial: 380 mm[Hg] — ABNORMAL HIGH (ref 83–108)
pO2, Arterial: 292 mm[Hg] — ABNORMAL HIGH (ref 83–108)
pO2, Arterial: 86 mm[Hg] (ref 83–108)
pO2, Arterial: 94 mm[Hg] (ref 83–108)
pO2, Arterial: 95 mm[Hg] (ref 83–108)

## 2023-01-31 LAB — POCT I-STAT, CHEM 8
BUN: 13 mg/dL (ref 8–23)
BUN: 13 mg/dL (ref 8–23)
BUN: 11 mg/dL (ref 8–23)
BUN: 11 mg/dL (ref 8–23)
BUN: 11 mg/dL (ref 8–23)
Calcium, Ion: 1.33 mmol/L (ref 1.15–1.40)
Calcium, Ion: 1.35 mmol/L (ref 1.15–1.40)
Calcium, Ion: 1.06 mmol/L — ABNORMAL LOW (ref 1.15–1.40)
Calcium, Ion: 1.1 mmol/L — ABNORMAL LOW (ref 1.15–1.40)
Calcium, Ion: 1.06 mmol/L — ABNORMAL LOW (ref 1.15–1.40)
Chloride: 104 mmol/L (ref 98–111)
Chloride: 105 mmol/L (ref 98–111)
Chloride: 103 mmol/L (ref 98–111)
Chloride: 103 mmol/L (ref 98–111)
Chloride: 104 mmol/L (ref 98–111)
Creatinine, Ser: 0.9 mg/dL (ref 0.44–1.00)
Creatinine, Ser: 0.9 mg/dL (ref 0.44–1.00)
Creatinine, Ser: 0.7 mg/dL (ref 0.44–1.00)
Creatinine, Ser: 0.7 mg/dL (ref 0.44–1.00)
Creatinine, Ser: 0.7 mg/dL (ref 0.44–1.00)
Glucose, Bld: 148 mg/dL — ABNORMAL HIGH (ref 70–99)
Glucose, Bld: 112 mg/dL — ABNORMAL HIGH (ref 70–99)
Glucose, Bld: 116 mg/dL — ABNORMAL HIGH (ref 70–99)
Glucose, Bld: 133 mg/dL — ABNORMAL HIGH (ref 70–99)
Glucose, Bld: 136 mg/dL — ABNORMAL HIGH (ref 70–99)
HCT: 31 % — ABNORMAL LOW (ref 36.0–46.0)
HCT: 30 % — ABNORMAL LOW (ref 36.0–46.0)
HCT: 24 % — ABNORMAL LOW (ref 36.0–46.0)
HCT: 26 % — ABNORMAL LOW (ref 36.0–46.0)
HCT: 23 % — ABNORMAL LOW (ref 36.0–46.0)
Hemoglobin: 10.5 g/dL — ABNORMAL LOW (ref 12.0–15.0)
Hemoglobin: 10.2 g/dL — ABNORMAL LOW (ref 12.0–15.0)
Hemoglobin: 8.2 g/dL — ABNORMAL LOW (ref 12.0–15.0)
Hemoglobin: 8.8 g/dL — ABNORMAL LOW (ref 12.0–15.0)
Hemoglobin: 7.8 g/dL — ABNORMAL LOW (ref 12.0–15.0)
Potassium: 3.4 mmol/L — ABNORMAL LOW (ref 3.5–5.1)
Potassium: 3.5 mmol/L (ref 3.5–5.1)
Potassium: 3.4 mmol/L — ABNORMAL LOW (ref 3.5–5.1)
Potassium: 3.2 mmol/L — ABNORMAL LOW (ref 3.5–5.1)
Potassium: 3.8 mmol/L (ref 3.5–5.1)
Sodium: 139 mmol/L (ref 135–145)
Sodium: 139 mmol/L (ref 135–145)
Sodium: 139 mmol/L (ref 135–145)
Sodium: 140 mmol/L (ref 135–145)
Sodium: 141 mmol/L (ref 135–145)
TCO2: 21 mmol/L — ABNORMAL LOW (ref 22–32)
TCO2: 23 mmol/L (ref 22–32)
TCO2: 23 mmol/L (ref 22–32)
TCO2: 24 mmol/L (ref 22–32)
TCO2: 22 mmol/L (ref 22–32)

## 2023-01-31 LAB — BASIC METABOLIC PANEL
Anion gap: 9 (ref 5–15)
BUN: 9 mg/dL (ref 8–23)
CO2: 20 mmol/L — ABNORMAL LOW (ref 22–32)
Calcium: 7.7 mg/dL — ABNORMAL LOW (ref 8.9–10.3)
Chloride: 109 mmol/L (ref 98–111)
Creatinine, Ser: 0.75 mg/dL (ref 0.44–1.00)
GFR, Estimated: >60 mL/min (ref >60)
Glucose, Bld: 134 mg/dL — ABNORMAL HIGH (ref 70–99)
Potassium: 3.6 mmol/L (ref 3.5–5.1)
Sodium: 138 mmol/L (ref 135–145)

## 2023-01-31 LAB — CBC
HCT: 36.2 % (ref 36.0–46.0)
HCT: 31.1 % — ABNORMAL LOW (ref 36.0–46.0)
Hemoglobin: 12.3 g/dL (ref 12.0–15.0)
Hemoglobin: 10.6 g/dL — ABNORMAL LOW (ref 12.0–15.0)
MCH: 30.6 pg (ref 26.0–34.0)
MCH: 30.6 pg (ref 26.0–34.0)
MCHC: 34 g/dL (ref 30.0–36.0)
MCHC: 34.1 g/dL (ref 30.0–36.0)
MCV: 90 fL (ref 80.0–100.0)
MCV: 89.9 fL (ref 80.0–100.0)
Platelets: 137 10*3/uL — ABNORMAL LOW (ref 150–400)
Platelets: 118 10*3/uL — ABNORMAL LOW (ref 150–400)
RBC: 4.02 MIL/uL (ref 3.87–5.11)
RBC: 3.46 MIL/uL — ABNORMAL LOW (ref 3.87–5.11)
RDW: 14 % (ref 11.5–15.5)
RDW: 14.3 % (ref 11.5–15.5)
WBC: 15 10*3/uL — ABNORMAL HIGH (ref 4.0–10.5)
WBC: 11.6 10*3/uL — ABNORMAL HIGH (ref 4.0–10.5)
nRBC: 0 % (ref 0.0–0.2)
nRBC: 0 % (ref 0.0–0.2)

## 2023-01-31 LAB — BLOOD GAS, ARTERIAL
Acid-base deficit: 2.6 mmol/L — ABNORMAL HIGH (ref 0.0–2.0)
Bicarbonate: 21.8 mmol/L (ref 20.0–28.0)
Drawn by: 32084
O2 Saturation: 96.1 %
Patient temperature: 37
pCO2 arterial: 36 mm[Hg] (ref 32–48)
pH, Arterial: 7.39 (ref 7.35–7.45)
pO2, Arterial: 75 mm[Hg] — ABNORMAL LOW (ref 83–108)

## 2023-01-31 LAB — ECHO INTRAOPERATIVE TEE
AR max vel: 0.87 cm2
AV Area VTI: 0.82 cm2
AV Area mean vel: 0.85 cm2
AV Mean grad: 22.7 mm[Hg]
AV Peak grad: 32.4 mm[Hg]
Ao pk vel: 2.85 m/s
Height: 61 in
MV VTI: 1.87 cm2
S' Lateral: 3.2 cm
Weight: 2416 [oz_av]

## 2023-01-31 LAB — MAGNESIUM: Magnesium: 3.5 mg/dL — ABNORMAL HIGH (ref 1.7–2.4)

## 2023-01-31 LAB — POCT I-STAT EG7
Acid-Base Excess: 0 mmol/L (ref 0.0–2.0)
Bicarbonate: 24.7 mmol/L (ref 20.0–28.0)
Calcium, Ion: 1.08 mmol/L — ABNORMAL LOW (ref 1.15–1.40)
HCT: 23 % — ABNORMAL LOW (ref 36.0–46.0)
Hemoglobin: 7.8 g/dL — ABNORMAL LOW (ref 12.0–15.0)
O2 Saturation: 78 %
Potassium: 4 mmol/L (ref 3.5–5.1)
Sodium: 138 mmol/L (ref 135–145)
TCO2: 26 mmol/L (ref 22–32)
pCO2, Ven: 41.2 mm[Hg] — ABNORMAL LOW (ref 44–60)
pH, Ven: 7.385 (ref 7.25–7.43)
pO2, Ven: 44 mm[Hg] (ref 32–45)

## 2023-01-31 LAB — GLUCOSE, CAPILLARY
Glucose-Capillary: 137 mg/dL — ABNORMAL HIGH (ref 70–99)
Glucose-Capillary: 134 mg/dL — ABNORMAL HIGH (ref 70–99)
Glucose-Capillary: 123 mg/dL — ABNORMAL HIGH (ref 70–99)
Glucose-Capillary: 165 mg/dL — ABNORMAL HIGH (ref 70–99)
Glucose-Capillary: 130 mg/dL — ABNORMAL HIGH (ref 70–99)
Glucose-Capillary: 153 mg/dL — ABNORMAL HIGH (ref 70–99)
Glucose-Capillary: 135 mg/dL — ABNORMAL HIGH (ref 70–99)
Glucose-Capillary: 141 mg/dL — ABNORMAL HIGH (ref 70–99)

## 2023-01-31 LAB — PROTIME-INR
INR: 1.5 — ABNORMAL HIGH (ref 0.8–1.2)
Prothrombin Time: 17.9 s — ABNORMAL HIGH (ref 11.4–15.2)

## 2023-01-31 LAB — HEMOGLOBIN AND HEMATOCRIT, BLOOD
HCT: 23.5 % — ABNORMAL LOW (ref 36.0–46.0)
Hemoglobin: 8 g/dL — ABNORMAL LOW (ref 12.0–15.0)

## 2023-01-31 LAB — APTT: aPTT: 33 s (ref 24–36)

## 2023-01-31 LAB — PREPARE RBC (CROSSMATCH)

## 2023-01-31 LAB — PLATELET COUNT: Platelets: 169 10*3/uL (ref 150–400)

## 2023-01-31 SURGERY — REPLACEMENT, AORTIC VALVE, OPEN
Anesthesia: General | Site: Chest

## 2023-01-31 MED ORDER — FENTANYL CITRATE (PF) 250 MCG/5ML IJ SOLN
INTRAMUSCULAR | Status: AC
  Filled 2023-01-31: qty 5

## 2023-01-31 MED ORDER — ROSUVASTATIN CALCIUM 20 MG PO TABS
20.0000 mg | ORAL_TABLET | Freq: Two times a day (BID) | ORAL | Status: DC
  Administered 2023-02-01 – 2023-02-06 (×11): 20 mg via ORAL
  Filled 2023-01-31 (×11): qty 1

## 2023-01-31 MED ORDER — ASPIRIN 81 MG PO CHEW
324.0000 mg | CHEWABLE_TABLET | Freq: Once | ORAL | Status: AC
  Administered 2023-01-31: 324 mg via ORAL
  Filled 2023-01-31: qty 4

## 2023-01-31 MED ORDER — CHLORHEXIDINE GLUCONATE 4 % EX SOLN
30.0000 mL | CUTANEOUS | Status: DC
Start: 2023-01-31 — End: 2023-01-31

## 2023-01-31 MED ORDER — CHLORHEXIDINE GLUCONATE CLOTH 2 % EX PADS
6.0000 | MEDICATED_PAD | Freq: Every day | CUTANEOUS | Status: DC
  Administered 2023-01-31 – 2023-02-02 (×3): 6 via TOPICAL

## 2023-01-31 MED ORDER — METOPROLOL TARTRATE 5 MG/5ML IV SOLN
2.5000 mg | INTRAVENOUS | Status: DC | PRN
  Administered 2023-02-02: 5 mg via INTRAVENOUS
  Filled 2023-01-31: qty 5

## 2023-01-31 MED ORDER — METOPROLOL TARTRATE 12.5 MG HALF TABLET: 12.5000 mg | ORAL_TABLET | Freq: Two times a day (BID) | ORAL | Status: DC

## 2023-01-31 MED ORDER — INSULIN REGULAR(HUMAN) IN NACL 100-0.9 UT/100ML-% IV SOLN: INTRAVENOUS | Status: DC

## 2023-01-31 MED ORDER — 0.9 % SODIUM CHLORIDE (POUR BTL) OPTIME
TOPICAL | Status: DC | PRN
  Administered 2023-01-31: 5000 mL

## 2023-01-31 MED ORDER — ASPIRIN 325 MG PO TBEC
325.0000 mg | DELAYED_RELEASE_TABLET | Freq: Every day | ORAL | Status: DC
  Administered 2023-02-01 – 2023-02-04 (×4): 325 mg via ORAL
  Filled 2023-01-31 (×4): qty 1

## 2023-01-31 MED ORDER — HEPARIN SODIUM (PORCINE) 1000 UNIT/ML IJ SOLN
INTRAMUSCULAR | Status: AC
  Filled 2023-01-31: qty 1

## 2023-01-31 MED ORDER — PHENYLEPHRINE 80 MCG/ML (10ML) SYRINGE FOR IV PUSH (FOR BLOOD PRESSURE SUPPORT)
PREFILLED_SYRINGE | INTRAVENOUS | Status: DC | PRN
  Administered 2023-01-31: 160 ug via INTRAVENOUS

## 2023-01-31 MED ORDER — POTASSIUM CHLORIDE 10 MEQ/50ML IV SOLN
10.0000 meq | INTRAVENOUS | Status: AC
  Administered 2023-02-01 (×2): 10 meq via INTRAVENOUS

## 2023-01-31 MED ORDER — OXYCODONE HCL 5 MG PO TABS
5.0000 mg | ORAL_TABLET | ORAL | Status: DC | PRN
  Administered 2023-02-01: 10 mg via ORAL
  Administered 2023-02-01: 5 mg via ORAL
  Filled 2023-01-31 (×2): qty 2

## 2023-01-31 MED ORDER — LACTATED RINGERS IV SOLN: INTRAVENOUS | Status: AC

## 2023-01-31 MED ORDER — SODIUM CHLORIDE 0.9% FLUSH
3.0000 mL | Freq: Two times a day (BID) | INTRAVENOUS | Status: DC
  Administered 2023-02-01 – 2023-02-08 (×15): 3 mL via INTRAVENOUS

## 2023-01-31 MED ORDER — LACTATED RINGERS IV SOLN: INTRAVENOUS | Status: DC | PRN

## 2023-01-31 MED ORDER — TRAMADOL HCL 50 MG PO TABS
50.0000 mg | ORAL_TABLET | Freq: Four times a day (QID) | ORAL | Status: DC | PRN
  Administered 2023-01-31 – 2023-02-01 (×3): 50 mg via ORAL
  Filled 2023-01-31 (×4): qty 1

## 2023-01-31 MED ORDER — ACETAMINOPHEN 160 MG/5ML PO SOLN
650.0000 mg | Freq: Once | ORAL | Status: AC
Start: 2023-01-31 — End: 2023-01-31
  Administered 2023-01-31: 650 mg
  Filled 2023-01-31: qty 20.3

## 2023-01-31 MED ORDER — HEMOSTATIC AGENTS (NO CHARGE) OPTIME
TOPICAL | Status: DC | PRN
Start: 2023-01-31 — End: 2023-01-31
  Administered 2023-01-31 (×2): 1 via TOPICAL

## 2023-01-31 MED ORDER — METOCLOPRAMIDE HCL 5 MG/ML IJ SOLN
10.0000 mg | Freq: Four times a day (QID) | INTRAMUSCULAR | Status: AC
  Administered 2023-01-31 – 2023-02-01 (×6): 10 mg via INTRAVENOUS
  Filled 2023-01-31 (×6): qty 2

## 2023-01-31 MED ORDER — PROTAMINE SULFATE 10 MG/ML IV SOLN
INTRAVENOUS | Status: DC | PRN
  Administered 2023-01-31: 20 mg via INTRAVENOUS
  Administered 2023-01-31: 180 mg via INTRAVENOUS

## 2023-01-31 MED ORDER — DEXTROSE 50 % IV SOLN: 0.0000 mL | INTRAVENOUS | Status: DC | PRN

## 2023-01-31 MED ORDER — SODIUM CHLORIDE 0.45 % IV SOLN
INTRAVENOUS | Status: AC | PRN
Start: 2023-01-31 — End: 2023-02-01

## 2023-01-31 MED ORDER — MORPHINE SULFATE (PF) 2 MG/ML IV SOLN
1.0000 mg | INTRAVENOUS | Status: DC | PRN
  Administered 2023-01-31 (×2): 4 mg via INTRAVENOUS
  Administered 2023-02-01: 2 mg via INTRAVENOUS
  Administered 2023-02-01: 4 mg via INTRAVENOUS
  Filled 2023-01-31: qty 1
  Filled 2023-01-31 (×2): qty 2
  Filled 2023-01-31 (×2): qty 1

## 2023-01-31 MED ORDER — SODIUM CHLORIDE (PF) 0.9 % IJ SOLN: OROMUCOSAL | Status: DC | PRN

## 2023-01-31 MED ORDER — EPINEPHRINE HCL 5 MG/250ML IV SOLN IN NS: 0.0000 ug/min | INTRAVENOUS | Status: DC

## 2023-01-31 MED ORDER — ASPIRIN 81 MG PO CHEW: 324.0000 mg | CHEWABLE_TABLET | Freq: Every day | ORAL | Status: DC

## 2023-01-31 MED ORDER — VANCOMYCIN HCL IN DEXTROSE 1-5 GM/200ML-% IV SOLN
1000.0000 mg | Freq: Once | INTRAVENOUS | Status: AC
  Administered 2023-01-31: 1000 mg via INTRAVENOUS
  Filled 2023-01-31: qty 200

## 2023-01-31 MED ORDER — POTASSIUM CHLORIDE 10 MEQ/50ML IV SOLN
10.0000 meq | INTRAVENOUS | Status: AC
  Administered 2023-01-31 (×3): 10 meq via INTRAVENOUS

## 2023-01-31 MED ORDER — PROTAMINE SULFATE 10 MG/ML IV SOLN
INTRAVENOUS | Status: AC
  Filled 2023-01-31: qty 25

## 2023-01-31 MED ORDER — METOPROLOL TARTRATE 12.5 MG HALF TABLET
12.5000 mg | ORAL_TABLET | Freq: Once | ORAL | Status: AC
Start: 2023-01-31 — End: 2023-01-31
  Administered 2023-01-31: 12.5 mg via ORAL
  Filled 2023-01-31: qty 1

## 2023-01-31 MED ORDER — DEXMEDETOMIDINE HCL IN NACL 400 MCG/100ML IV SOLN: 0.0000 ug/kg/h | INTRAVENOUS | Status: DC

## 2023-01-31 MED ORDER — CHLORHEXIDINE GLUCONATE 0.12 % MT SOLN
15.0000 mL | OROMUCOSAL | Status: AC
  Administered 2023-01-31: 15 mL via OROMUCOSAL
  Filled 2023-01-31: qty 15

## 2023-01-31 MED ORDER — ONDANSETRON HCL 4 MG/2ML IJ SOLN
4.0000 mg | Freq: Four times a day (QID) | INTRAMUSCULAR | Status: DC | PRN
  Administered 2023-02-01: 4 mg via INTRAVENOUS
  Filled 2023-01-31: qty 2

## 2023-01-31 MED ORDER — ~~LOC~~ CARDIAC SURGERY, PATIENT & FAMILY EDUCATION
Freq: Once | Status: DC
Start: 2023-01-31 — End: 2023-01-31
  Filled 2023-01-31: qty 1

## 2023-01-31 MED ORDER — PROPOFOL 10 MG/ML IV BOLUS
INTRAVENOUS | Status: DC | PRN
  Administered 2023-01-31 (×2): 20 mg via INTRAVENOUS

## 2023-01-31 MED ORDER — ALBUMIN HUMAN 5 % IV SOLN: INTRAVENOUS | Status: DC | PRN

## 2023-01-31 MED ORDER — GABAPENTIN 300 MG PO CAPS
300.0000 mg | ORAL_CAPSULE | Freq: Every evening | ORAL | Status: DC
  Administered 2023-02-01 – 2023-02-07 (×7): 300 mg via ORAL
  Filled 2023-01-31 (×7): qty 1

## 2023-01-31 MED ORDER — SODIUM BICARBONATE 8.4 % IV SOLN
100.0000 meq | Freq: Once | INTRAVENOUS | Status: AC
  Administered 2023-01-31: 100 meq via INTRAVENOUS

## 2023-01-31 MED ORDER — MAGNESIUM SULFATE 4 GM/100ML IV SOLN
4.0000 g | Freq: Once | INTRAVENOUS | Status: AC
  Administered 2023-01-31: 4 g via INTRAVENOUS
  Filled 2023-01-31: qty 100

## 2023-01-31 MED ORDER — MELATONIN 10 MG PO TABS
1.0000 | ORAL_TABLET | Freq: Every day | ORAL | Status: DC
Start: 2023-02-01 — End: 2023-01-31

## 2023-01-31 MED ORDER — ACETAMINOPHEN 160 MG/5ML PO SOLN: 1000.0000 mg | Freq: Four times a day (QID) | ORAL | Status: AC

## 2023-01-31 MED ORDER — SODIUM CHLORIDE 0.9% FLUSH
3.0000 mL | INTRAVENOUS | Status: DC | PRN
Start: 2023-02-01 — End: 2023-02-08

## 2023-01-31 MED ORDER — CHLORHEXIDINE GLUCONATE 0.12 % MT SOLN
15.0000 mL | Freq: Once | OROMUCOSAL | Status: AC
  Administered 2023-01-31: 15 mL via OROMUCOSAL
  Filled 2023-01-31: qty 15

## 2023-01-31 MED ORDER — ONDANSETRON HCL 4 MG/2ML IJ SOLN
INTRAMUSCULAR | Status: DC | PRN
  Administered 2023-01-31: 4 mg via INTRAVENOUS

## 2023-01-31 MED ORDER — ACETAMINOPHEN 500 MG PO TABS
1000.0000 mg | ORAL_TABLET | Freq: Four times a day (QID) | ORAL | Status: AC
  Administered 2023-01-31 – 2023-02-05 (×19): 1000 mg via ORAL
  Filled 2023-01-31 (×18): qty 2

## 2023-01-31 MED ORDER — ROCURONIUM BROMIDE 10 MG/ML (PF) SYRINGE
PREFILLED_SYRINGE | INTRAVENOUS | Status: AC
  Filled 2023-01-31: qty 20

## 2023-01-31 MED ORDER — ONDANSETRON HCL 4 MG/2ML IJ SOLN
INTRAMUSCULAR | Status: AC
  Filled 2023-01-31: qty 2

## 2023-01-31 MED ORDER — BISACODYL 5 MG PO TBEC
10.0000 mg | DELAYED_RELEASE_TABLET | Freq: Every day | ORAL | Status: DC
  Administered 2023-02-01 – 2023-02-02 (×2): 10 mg via ORAL
  Filled 2023-01-31 (×2): qty 2

## 2023-01-31 MED ORDER — NITROGLYCERIN IN D5W 200-5 MCG/ML-% IV SOLN: 0.0000 ug/min | INTRAVENOUS | Status: DC

## 2023-01-31 MED ORDER — FENTANYL CITRATE (PF) 250 MCG/5ML IJ SOLN
INTRAMUSCULAR | Status: DC | PRN
  Administered 2023-01-31 (×9): 50 ug via INTRAVENOUS
  Administered 2023-01-31: 100 ug via INTRAVENOUS
  Administered 2023-01-31: 50 ug via INTRAVENOUS
  Administered 2023-01-31: 600 ug via INTRAVENOUS
  Administered 2023-01-31: 50 ug via INTRAVENOUS

## 2023-01-31 MED ORDER — MELATONIN 5 MG PO TABS
10.0000 mg | ORAL_TABLET | Freq: Every day | ORAL | Status: DC
  Administered 2023-02-01 – 2023-02-07 (×7): 10 mg via ORAL
  Filled 2023-01-31 (×7): qty 2

## 2023-01-31 MED ORDER — HEPARIN SODIUM (PORCINE) 1000 UNIT/ML IJ SOLN
INTRAMUSCULAR | Status: DC | PRN
  Administered 2023-01-31: 22000 [IU] via INTRAVENOUS

## 2023-01-31 MED ORDER — MIDAZOLAM HCL (PF) 10 MG/2ML IJ SOLN
INTRAMUSCULAR | Status: AC
  Filled 2023-01-31: qty 2

## 2023-01-31 MED ORDER — PANTOPRAZOLE SODIUM 40 MG IV SOLR
40.0000 mg | Freq: Every day | INTRAVENOUS | Status: AC
  Administered 2023-01-31 – 2023-02-01 (×2): 40 mg via INTRAVENOUS
  Filled 2023-01-31 (×2): qty 10

## 2023-01-31 MED ORDER — SODIUM CHLORIDE 0.9 % IV SOLN: INTRAVENOUS | Status: AC

## 2023-01-31 MED ORDER — METOPROLOL TARTRATE 25 MG/10 ML ORAL SUSPENSION: 12.5000 mg | Freq: Two times a day (BID) | ORAL | Status: DC

## 2023-01-31 MED ORDER — PHENYLEPHRINE 80 MCG/ML (10ML) SYRINGE FOR IV PUSH (FOR BLOOD PRESSURE SUPPORT)
PREFILLED_SYRINGE | INTRAVENOUS | Status: AC
  Filled 2023-01-31: qty 10

## 2023-01-31 MED ORDER — DOCUSATE SODIUM 100 MG PO CAPS
200.0000 mg | ORAL_CAPSULE | Freq: Every day | ORAL | Status: DC
  Administered 2023-02-01 – 2023-02-08 (×7): 200 mg via ORAL
  Filled 2023-01-31 (×8): qty 2

## 2023-01-31 MED ORDER — PHENYLEPHRINE HCL-NACL 20-0.9 MG/250ML-% IV SOLN
0.0000 ug/min | INTRAVENOUS | Status: DC
  Administered 2023-02-01: 35 ug/min via INTRAVENOUS
  Filled 2023-01-31: qty 250

## 2023-01-31 MED ORDER — CEFAZOLIN SODIUM-DEXTROSE 2-4 GM/100ML-% IV SOLN
2.0000 g | Freq: Three times a day (TID) | INTRAVENOUS | Status: AC
  Administered 2023-01-31 – 2023-02-02 (×6): 2 g via INTRAVENOUS
  Filled 2023-01-31 (×5): qty 100

## 2023-01-31 MED ORDER — ORAL CARE MOUTH RINSE: 15.0000 mL | OROMUCOSAL | Status: DC | PRN

## 2023-01-31 MED ORDER — BISACODYL 10 MG RE SUPP: 10.0000 mg | Freq: Every day | RECTAL | Status: DC

## 2023-01-31 MED ORDER — POTASSIUM CHLORIDE 10 MEQ/50ML IV SOLN
INTRAVENOUS | Status: AC
  Administered 2023-02-01: 10 meq via INTRAVENOUS
  Filled 2023-01-31: qty 150

## 2023-01-31 MED ORDER — ROCURONIUM BROMIDE 10 MG/ML (PF) SYRINGE
PREFILLED_SYRINGE | INTRAVENOUS | Status: DC | PRN
  Administered 2023-01-31: 60 mg via INTRAVENOUS
  Administered 2023-01-31: 40 mg via INTRAVENOUS
  Administered 2023-01-31: 30 mg via INTRAVENOUS

## 2023-01-31 MED ORDER — MIDAZOLAM HCL 2 MG/2ML IJ SOLN
2.0000 mg | INTRAMUSCULAR | Status: DC | PRN
Start: 2023-01-31 — End: 2023-02-01

## 2023-01-31 MED ORDER — SODIUM CHLORIDE 0.9 % IV SOLN: 250.0000 mL | INTRAVENOUS | Status: AC

## 2023-01-31 MED ORDER — PROPOFOL 10 MG/ML IV BOLUS
INTRAVENOUS | Status: AC
  Filled 2023-01-31: qty 20

## 2023-01-31 MED ORDER — ALBUMIN HUMAN 5 % IV SOLN
250.0000 mL | INTRAVENOUS | Status: DC | PRN
Start: 2023-01-31 — End: 2023-02-01
  Administered 2023-01-31 (×3): 12.5 g via INTRAVENOUS
  Filled 2023-01-31: qty 250

## 2023-01-31 MED ORDER — THROMBIN 20000 UNITS EX SOLR
CUTANEOUS | Status: DC | PRN
  Administered 2023-01-31: 20000 [IU] via TOPICAL

## 2023-01-31 MED ORDER — MIDAZOLAM HCL (PF) 5 MG/ML IJ SOLN
INTRAMUSCULAR | Status: DC | PRN
  Administered 2023-01-31: 2 mg via INTRAVENOUS
  Administered 2023-01-31: 1 mg via INTRAVENOUS
  Administered 2023-01-31: 2 mg via INTRAVENOUS

## 2023-01-31 MED ORDER — THROMBIN (RECOMBINANT) 20000 UNITS EX SOLR
CUTANEOUS | Status: AC
  Filled 2023-01-31: qty 20000

## 2023-01-31 MED ORDER — PANTOPRAZOLE SODIUM 40 MG PO TBEC
40.0000 mg | DELAYED_RELEASE_TABLET | Freq: Every day | ORAL | Status: DC
  Administered 2023-02-02 – 2023-02-08 (×7): 40 mg via ORAL
  Filled 2023-01-31 (×7): qty 1

## 2023-01-31 MED ORDER — PLASMA-LYTE A IV SOLN
INTRAVENOUS | Status: DC | PRN
  Administered 2023-01-31: 500 mL

## 2023-01-31 SURGICAL SUPPLY — 141 items
ADAPTER CARDIO PERF ANTE/RETRO (ADAPTER) ×2 IMPLANT
ADH SKN CLS APL DERMABOND .7 (GAUZE/BANDAGES/DRESSINGS) ×2
ADPR PRFSN 84XANTGRD RTRGD (ADAPTER) ×2
BAG DECANTER FOR FLEXI CONT (MISCELLANEOUS) ×2 IMPLANT
BLADE CLIPPER SURG (BLADE) ×2 IMPLANT
BLADE STERNUM SYSTEM 6 (BLADE) ×2 IMPLANT
BLADE SURG 11 STRL SS (BLADE) IMPLANT
BLADE SURG 15 STRL LF DISP TIS (BLADE) ×2 IMPLANT
BLADE SURG 15 STRL SS (BLADE) ×2
BNDG CMPR 5X4 KNIT ELC UNQ LF (GAUZE/BANDAGES/DRESSINGS) ×2
BNDG CMPR 6 X 5 YARDS HK CLSR (GAUZE/BANDAGES/DRESSINGS) ×2
BNDG ELASTIC 4INX 5YD STR LF (GAUZE/BANDAGES/DRESSINGS) IMPLANT
BNDG ELASTIC 4X5.8 VLCR STR LF (GAUZE/BANDAGES/DRESSINGS) ×2 IMPLANT
BNDG ELASTIC 6INX 5YD STR LF (GAUZE/BANDAGES/DRESSINGS) IMPLANT
BNDG ELASTIC 6X5.8 VLCR STR LF (GAUZE/BANDAGES/DRESSINGS) ×2 IMPLANT
BNDG GAUZE DERMACEA FLUFF 4 (GAUZE/BANDAGES/DRESSINGS) ×2 IMPLANT
BNDG GZE DERMACEA 4 6PLY (GAUZE/BANDAGES/DRESSINGS) ×2
CABLE PACING FASLOC BIEGE (MISCELLANEOUS) IMPLANT
CANISTER SUCT 3000ML PPV (MISCELLANEOUS) ×2 IMPLANT
CANN PRFSN .5XCNCT 15X34-48 (MISCELLANEOUS) ×2
CANNULA ARTERIAL VENT 3/8 20FR (CANNULA) IMPLANT
CANNULA GUNDRY RCSP 15FR (MISCELLANEOUS) ×2 IMPLANT
CANNULA LEFT HEART VENT 20FR (CATHETERS) IMPLANT
CANNULA PRFSN .5XCNCT 15X34-48 (MISCELLANEOUS) IMPLANT
CANNULA VEN 2 STAGE (MISCELLANEOUS) ×2
CATH HEART VENT LEFT (CATHETERS) ×2 IMPLANT
CATH ROBINSON RED A/P 18FR (CATHETERS) ×6 IMPLANT
CATH THORACIC 28FR (CATHETERS) ×2 IMPLANT
CATH THORACIC 36FR (CATHETERS) ×2 IMPLANT
CATH THORACIC 36FR RT ANG (CATHETERS) ×2 IMPLANT
CLIP TI MEDIUM 24 (CLIP) IMPLANT
CLIP TI WIDE RED SMALL 24 (CLIP) IMPLANT
CNTNR URN SCR LID CUP LEK RST (MISCELLANEOUS) ×2 IMPLANT
CONN Y 3/8X3/8X3/8 BEN (MISCELLANEOUS) IMPLANT
CONT SPEC 4OZ STRL OR WHT (MISCELLANEOUS) ×2
CONTAINER PROTECT SURGISLUSH (MISCELLANEOUS) ×4 IMPLANT
COVER SURGICAL LIGHT HANDLE (MISCELLANEOUS) ×2 IMPLANT
DERMABOND ADVANCED .7 DNX12 (GAUZE/BANDAGES/DRESSINGS) IMPLANT
DEVICE SUT CK QUICK LOAD INDV (Prosthesis & Implant Heart) IMPLANT
DEVICE SUT CK QUICK LOAD MINI (Prosthesis & Implant Heart) IMPLANT
DRAPE CARDIOVASCULAR INCISE (DRAPES) ×2
DRAPE SRG 135X102X78XABS (DRAPES) ×2 IMPLANT
DRAPE WARM FLUID 44X44 (DRAPES) ×2 IMPLANT
DRSG COVADERM 4X14 (GAUZE/BANDAGES/DRESSINGS) ×2 IMPLANT
ELECT CAUTERY BLADE 6.4 (BLADE) ×2 IMPLANT
ELECT REM PT RETURN 9FT ADLT (ELECTROSURGICAL) ×4
ELECTRODE REM PT RTRN 9FT ADLT (ELECTROSURGICAL) ×4 IMPLANT
FELT TEFLON 1X6 (MISCELLANEOUS) ×4 IMPLANT
GAUZE 4X4 16PLY ~~LOC~~+RFID DBL (SPONGE) ×2 IMPLANT
GAUZE SPONGE 4X4 12PLY STRL (GAUZE/BANDAGES/DRESSINGS) ×4 IMPLANT
GLOVE BIO SURGEON STRL SZ 6 (GLOVE) IMPLANT
GLOVE BIO SURGEON STRL SZ 6.5 (GLOVE) IMPLANT
GLOVE BIO SURGEON STRL SZ7 (GLOVE) IMPLANT
GLOVE BIO SURGEON STRL SZ7.5 (GLOVE) IMPLANT
GLOVE BIOGEL PI IND STRL 6 (GLOVE) IMPLANT
GLOVE BIOGEL PI IND STRL 6.5 (GLOVE) IMPLANT
GLOVE BIOGEL PI IND STRL 7.0 (GLOVE) IMPLANT
GLOVE BIOGEL PI IND STRL 8.5 (GLOVE) IMPLANT
GLOVE ECLIPSE 7.0 STRL STRAW (GLOVE) ×4 IMPLANT
GLOVE ORTHO TXT STRL SZ7.5 (GLOVE) IMPLANT
GLOVE SURG MICRO LTX SZ7 (GLOVE) ×4 IMPLANT
GLOVE SURG SS PI 6.5 STRL IVOR (GLOVE) IMPLANT
GOWN STRL REUS W/ TWL LRG LVL3 (GOWN DISPOSABLE) ×8 IMPLANT
GOWN STRL REUS W/ TWL XL LVL3 (GOWN DISPOSABLE) ×2 IMPLANT
GOWN STRL REUS W/TWL LRG LVL3 (GOWN DISPOSABLE) ×10
GOWN STRL REUS W/TWL XL LVL3 (GOWN DISPOSABLE) ×6
HEMOSTAT POWDER SURGIFOAM 1G (HEMOSTASIS) ×6 IMPLANT
HEMOSTAT SURGICEL 2X14 (HEMOSTASIS) ×2 IMPLANT
INSERT FOGARTY 61MM (MISCELLANEOUS) IMPLANT
INSERT FOGARTY XLG (MISCELLANEOUS) IMPLANT
KIT BASIN OR (CUSTOM PROCEDURE TRAY) ×2 IMPLANT
KIT CATH CPB BARTLE (MISCELLANEOUS) ×2 IMPLANT
KIT SUCTION CATH 14FR (SUCTIONS) ×2 IMPLANT
KIT SUT CK MINI COMBO 4X17 (Prosthesis & Implant Heart) IMPLANT
KIT TURNOVER KIT B (KITS) ×2 IMPLANT
KIT VASOVIEW HEMOPRO 2 VH 4000 (KITS) ×2 IMPLANT
KNIFE MICRO-UNI 3.5 30 DEG (BLADE) ×2 IMPLANT
LINE VENT (MISCELLANEOUS) IMPLANT
NDL AORTIC AIR ASPIRATING (NEEDLE) IMPLANT
NEEDLE AORTIC AIR ASPIRATING (NEEDLE) ×2 IMPLANT
NS IRRIG 1000ML POUR BTL (IV SOLUTION) ×12 IMPLANT
PACK E OPEN HEART (SUTURE) ×2 IMPLANT
PACK OPEN HEART (CUSTOM PROCEDURE TRAY) ×2 IMPLANT
PAD ARMBOARD 7.5X6 YLW CONV (MISCELLANEOUS) ×4 IMPLANT
PAD ELECT DEFIB RADIOL ZOLL (MISCELLANEOUS) ×2 IMPLANT
PENCIL BUTTON HOLSTER BLD 10FT (ELECTRODE) ×2 IMPLANT
POSITIONER HEAD DONUT 9IN (MISCELLANEOUS) ×2 IMPLANT
PUNCH AORTIC ROTATE 4.0MM (MISCELLANEOUS) IMPLANT
PUNCH AORTIC ROTATE 4.5MM 8IN (MISCELLANEOUS) ×2 IMPLANT
PUNCH AORTIC ROTATE 5MM 8IN (MISCELLANEOUS) IMPLANT
SEALANT SURG COSEAL 8ML (VASCULAR PRODUCTS) IMPLANT
SET MPS 3-ND DEL (MISCELLANEOUS) IMPLANT
SPONGE INTESTINAL PEANUT (DISPOSABLE) IMPLANT
SPONGE T-LAP 18X18 ~~LOC~~+RFID (SPONGE) ×8 IMPLANT
SPONGE T-LAP 4X18 ~~LOC~~+RFID (SPONGE) ×2 IMPLANT
STOPCOCK 4 WAY LG BORE MALE ST (IV SETS) IMPLANT
SUPPORT HEART JANKE-BARRON (MISCELLANEOUS) ×2 IMPLANT
SUT BONE WAX W31G (SUTURE) ×2 IMPLANT
SUT EB EXC GRN/WHT 2-0 V-5 (SUTURE) ×4 IMPLANT
SUT ETHIBON EXCEL 2-0 V-5 (SUTURE) IMPLANT
SUT ETHIBOND V-5 VALVE (SUTURE) IMPLANT
SUT MNCRL AB 3-0 PS2 18 (SUTURE) IMPLANT
SUT MNCRL AB 4-0 PS2 18 (SUTURE) IMPLANT
SUT PROLENE 3 0 SH DA (SUTURE) IMPLANT
SUT PROLENE 3 0 SH1 36 (SUTURE) ×2 IMPLANT
SUT PROLENE 4 0 RB 1 (SUTURE) ×6
SUT PROLENE 4 0 SH DA (SUTURE) IMPLANT
SUT PROLENE 4-0 RB1 .5 CRCL 36 (SUTURE) ×6 IMPLANT
SUT PROLENE 5 0 C 1 36 (SUTURE) IMPLANT
SUT PROLENE 6 0 C 1 30 (SUTURE) IMPLANT
SUT PROLENE 7 0 BV 1 (SUTURE) IMPLANT
SUT PROLENE 7 0 BV1 MDA (SUTURE) ×2 IMPLANT
SUT PROLENE 8 0 BV175 6 (SUTURE) IMPLANT
SUT SILK 1 MH (SUTURE) IMPLANT
SUT SILK 2 0 SH (SUTURE) IMPLANT
SUT SILK 2 0 SH CR/8 (SUTURE) IMPLANT
SUT STEEL 6MS V (SUTURE) IMPLANT
SUT STEEL STERNAL CCS#1 18IN (SUTURE) IMPLANT
SUT STEEL SZ 6 DBL 3X14 BALL (SUTURE) IMPLANT
SUT VIC AB 1 CTX 36 (SUTURE) ×4
SUT VIC AB 1 CTX36XBRD ANBCTR (SUTURE) ×4 IMPLANT
SUT VIC AB 2-0 CT1 27 (SUTURE)
SUT VIC AB 2-0 CT1 TAPERPNT 27 (SUTURE) IMPLANT
SUT VIC AB 2-0 CTX 27 (SUTURE) IMPLANT
SUT VIC AB 3-0 SH 27 (SUTURE) ×2
SUT VIC AB 3-0 SH 27X BRD (SUTURE) IMPLANT
SUT VIC AB 3-0 X1 27 (SUTURE) IMPLANT
SUT VICRYL 4-0 PS2 18IN ABS (SUTURE) IMPLANT
SYSTEM SAHARA CHEST DRAIN ATS (WOUND CARE) ×2 IMPLANT
TAPE CLOTH SURG 4X10 WHT LF (GAUZE/BANDAGES/DRESSINGS) IMPLANT
TAPE PAPER 3X10 WHT MICROPORE (GAUZE/BANDAGES/DRESSINGS) IMPLANT
TOWEL GREEN STERILE (TOWEL DISPOSABLE) ×2 IMPLANT
TOWEL GREEN STERILE FF (TOWEL DISPOSABLE) ×2 IMPLANT
TRAY FOLEY SLVR 16FR LF STAT (SET/KITS/TRAYS/PACK) IMPLANT
TRAY FOLEY SLVR 16FR TEMP STAT (SET/KITS/TRAYS/PACK) ×2 IMPLANT
TUBE CONNECTING 20X1/4 (TUBING) IMPLANT
TUBING LAP HI FLOW INSUFFLATIO (TUBING) ×2 IMPLANT
UNDERPAD 30X36 HEAVY ABSORB (UNDERPADS AND DIAPERS) ×2 IMPLANT
VALVE AORTIC SZ21 INSP/RESIL (Valve) IMPLANT
VENT LEFT HEART 12002 (CATHETERS) ×2
WATER STERILE IRR 1000ML POUR (IV SOLUTION) ×4 IMPLANT

## 2023-01-31 NOTE — Anesthesia Procedure Notes (Signed)
Arterial Line Insertion Performed by: Dairl Ponder, CRNA  Patient location: Pre-op. Preanesthetic checklist: patient identified, IV checked, site marked, risks and benefits discussed, surgical consent, monitors and equipment checked, pre-op evaluation, timeout performed and anesthesia consent Lidocaine 1% used for infiltration Left, radial was placed Catheter size: 20 G Hand hygiene performed  and maximum sterile barriers used   Attempts: 1 Procedure performed without using ultrasound guided technique. Following insertion, dressing applied. Post procedure assessment: normal and unchanged

## 2023-01-31 NOTE — Discharge Summary (Signed)
[address]       [place] [address]             [phone]    Physician Discharge Summary  Patient ID: Darlene Kelly MRN: [id number] DOB/AGE: 09/01/1950 72 y.o.  Admit date: 01/31/2023 Discharge date: 02/08/2023  Admission Diagnoses:  Patient Active Problem List   Diagnosis Date Noted   Coronary artery disease 01/31/2023   OA (osteoarthritis) of knee 01/19/2019   Mild aortic stenosis 10/07/2018   Osteopenia 02/04/2018   Diabetes mellitus type II, non insulin dependent (HCC) 07/21/2014   Essential hypertension 07/21/2014   Cardiac murmur 07/21/2014   Hypercholesterolemia 07/21/2014   Malignant neoplasm of upper-outer quadrant of right breast in female, estrogen receptor positive (HCC) 07/16/2014   Patient Active Problem List   Diagnosis Date Noted   S/P AVR 02/06/2023   Atrial fibrillation (HCC) 02/06/2023   Acute kidney injury (HCC) 02/06/2023   Coronary artery disease 01/31/2023   OA (osteoarthritis) of knee 01/19/2019   Mild aortic stenosis 10/07/2018   Osteopenia 02/04/2018   Diabetes mellitus type II, non insulin dependent (HCC) 07/21/2014   Essential hypertension 07/21/2014   Cardiac murmur 07/21/2014   Hypercholesterolemia 07/21/2014   Malignant neoplasm of upper-outer quadrant of right breast in female, estrogen receptor positive (HCC) 07/16/2014   Discharged Condition: Stable  HPI: The patient is a 72 year old woman with history of hypertension, diabetes, right breast carcinoma status post chemotherapy and radiation therapy in 2016, a remote history of rheumatic fever as a child, and moderate aortic stenosis that has been followed by Dr. Odis Hollingshead.  She has developed progressively worsening exertional shortness of breath and fatigue as well as exertional chest discomfort radiating into her arms over the past several months.  She walks daily for about 1-1/2 miles with her husband and dogs and frequently gets symptoms that improve with rest.   She has had occasional episodes of chest discomfort while lying in bed.  She has had some lightheadedness but no syncope.  Her most recent echocardiogram showed an ejection fraction of 60 to 65% with grade 2 diastolic dysfunction.  There was an increase in the mean aortic valve gradient to 38 mmHg with a peak velocity of 4.2 m/s.  Dimensionless index was 0.26 with a valve area of 0.66 cm and a low stroke-volume index of 31.  She was seen by Dr. [name] for consideration of treatment of her severe aortic stenosis and underwent cardiac catheterization which showed severe distal left main and three-vessel coronary disease. CTA of aorta done 01/29/2023 showed no evidence of aortic enlargement.  Dr. Laneta Simmers discussed the need for aortic valve replacement (using a bioprosthetic valve) and coronary artery bypass grafting surgery. Potential risks, benefits, and complications of the surgery were discussed with the patient and she agreed to proceed with surgery.  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 She was transported from the OR to [facility] ICU. She was extubated the evening of surgery.  She was weaned off Neo-synephrine as hemodynamics allowed.  She was not initially started on lopressor.  Her arterial line and swan ganz catheter were removed without difficulty.  She was volume overloaded and Lasix and potassium were initiated.  She did develop postoperative atrial fibrillation and was started on amiodarone protocol.  She has subsequently been chemically cardioverted to normal sinus rhythm and transition to oral dosing.  On postop day #2 she was transferred to the floor.  Oxygen was weaned  off by the third post op day.   She is tolerating gradually increasing activities using standard post cardiac surgical protocols.  Incisions are noted to be healing well without evidence of infection.  She is tolerating diet.  She developed acute renal insufficiency postoperatively with  creatinine gradually trending up from a baseline of 0.84 to 3.18 on post-op day 5.  Urine output remained adequate and electrolytes were controlled. Diuretics were discontinued.  BP remained stable in the 100-120  systolic range.  Darlene Kelly had recurrent atrial fibrillation on post-op day 5. She converted back to NSR after an additional IV bolus of amiodarone.  Anticoagulation with Coumadin was initiated and the INR was monitored daily.  She will be discharged home on 2 mg of coumadin.  Her INR is 1.9 with a goal of 2.0.  She is ambulating independently.  Her surgical incisions are free from signs of infection.  She is stable for discharge home today.  Consults: None  Significant Diagnostic Studies:   IMPRESSIONS    1. Left ventricular diastolic function could not be evaluated due to  mitral annular calcification (but suggestive of Grade 2 diastolic  dysfunction). Left ventricular ejection fraction, by estimation, is 60 to  65%. The left ventricle has normal function.   The left ventricle has no regional wall motion abnormalities. Elevated  left atrial pressure. The E/e' is 21.   2. Right ventricular systolic function is normal. The right ventricular  size is normal. There is normal pulmonary artery systolic pressure.   3. Left atrial size was mildly dilated.   4. The mitral valve is degenerative. Mild mitral valve regurgitation. No  evidence of mitral stenosis.   5. Native trileaflet valve, calcified leaflet / annulus, trivial AR,  moderate / severe AS (peak velocity 4.61m/s, PG 71 mmHG, MG , DI  0.26, AVA VTI 0.66cm2).   6. The inferior vena cava is normal in size with greater than 50%  respiratory variability, suggesting right atrial pressure of 3 mmHg.   7. Rhythm strip during this exam demonstrates normal sinus rhythm.   Comparison(s): Prior study 11/23/2021: LVEF 60-65%, Grade 2 Diastolic  dysfunction, elevated LAP, moderate LAE, moderate AS (peak velocity  3.69m/s, MG 21 mmHg,  DI 0.3).    Treatments: surgery:  Median Sternotomy Extracorporeal circulation 3.   Coronary artery bypass grafting x 3   Left internal mammary artery graft to the LAD SVG to OM SVG to RCA   4.   Endoscopic vein harvest from the right leg 5.   Aortic valve replacement using a 21 mm Edwards INSPIRIS RESILIA pericardial valve by Dr. Laneta Simmers on 01/31/2023.  Discharge Exam: Blood pressure 118/67, pulse 81, temperature 98.4 F (36.9 C), temperature source Oral, resp. rate 19, height 5\' 1"  (1.549 m), weight 68.8 kg, SpO2 96%.  General appearance: alert, cooperative, and no distress Heart: regular rate and rhythm Lungs: clear to auscultation bilaterally Abdomen: soft, non-tender; bowel sounds normal; no masses,  no organomegaly Extremities: edema trace Wound: clean and dry, ecchymosis RLE  Discharge Medications:  The patient has been discharged on:   1.Beta Blocker:  Yes [ X  ]                              No   [   ]  If No, reason:  2.Ace Inhibitor/ARB: Yes [   ]                                     No  [  X  ]                                     If No, reason:  3.Statin:   Yes [ X  ]                  No  [   ]                  If No, reason:  4.Ecasa:  Yes  [  X ]                  No   [   ]                  If No, reason:  Patient had ACS upon admission: No  Plavix/P2Y12 inhibitor: Yes [   ]                                      No  [ x  ]     Discharge Instructions     Amb Referral to Cardiac Rehabilitation   Complete by: As directed    Diagnosis:  CABG Valve Replacement     Valve: Aortic   CABG X ___: 3   After initial evaluation and assessments completed: Virtual Based Care may be provided alone or in conjunction with Phase 2 Cardiac Rehab based on patient barriers.: Yes   Intensive Cardiac Rehabilitation (ICR) [facility] location only OR Traditional Cardiac Rehabilitation (TCR) *If criteria for ICR are not met will enroll in TCR [facility]  only): Yes      Allergies as of 02/08/2023       Reactions   Latex Itching, Dermatitis, Rash   Lipitor [atorvastatin] Other (See Comments)   Bad leg cramps        Medication List     STOP taking these medications    metFORMIN 500 MG 24 hr tablet Commonly known as: GLUCOPHAGE-XR   Ozempic (0.25 or 0.5 MG/DOSE) 2 MG/3ML Sopn Generic drug: Semaglutide(0.25 or 0.5MG /DOS)       TAKE these medications    acetaminophen 325 MG tablet Commonly known as: TYLENOL Take 2 tablets (650 mg total) by mouth every 6 (six) hours as needed for mild pain (pain score 1-3).   amiodarone 200 MG tablet Commonly known as: PACERONE Take 2 tablets (400 mg total) by mouth 2 (two) times daily. X 7 days, then decrease to 200 mg twice daily x 7 days, then decrease to 200 mg daily   ascorbic acid 500 MG tablet Commonly known as: VITAMIN C Take 500 mg by mouth daily.   aspirin EC 81 MG tablet Take 81 mg by mouth every evening. Swallow whole.   B-12 3000 MCG Caps Take 3,000 mcg by mouth daily at 12 noon.   CAL-MAG-ZINC PO Take 1 tablet by mouth 2 (two) times daily.   CoQ10 400 MG Caps Take 400 mg by mouth daily.   gabapentin 300 MG capsule Commonly known as: NEURONTIN Take 1  capsule (300 mg total) by mouth 3 (three) times daily. Take a 300 mg capsule three times a day for two weeks Then a 300 mg capsule twice a day for two weeks Then a 300 mg capsule once a day for two weeks What changed:  when to take this additional instructions   Melatonin 10 MG Tabs Take 1 tablet by mouth at bedtime.   metoprolol tartrate 25 MG tablet Commonly known as: LOPRESSOR Take 0.5 tablets (12.5 mg total) by mouth 2 (two) times daily. Hold for Blood Pressure less than 100   potassium chloride SA 20 MEQ tablet Commonly known as: KLOR-CON M Take 1 tablet (20 mEq total) by mouth daily. What changed:  how much to take when to take this additional instructions   rosuvastatin 40 MG tablet Commonly known  as: CRESTOR Take 20 mg by mouth 2 (two) times daily.   traMADol 50 MG tablet Commonly known as: ULTRAM Take 1 tablet (50 mg total) by mouth every 6 (six) hours as needed for moderate pain (pain score 4-6).   warfarin 2 MG tablet Commonly known as: COUMADIN Take 1 tablet (2 mg total) by mouth daily at 4 PM.        Follow-up Information     Alleen Borne, MD. Go on 02/27/2023.   Specialty: Cardiothoracic Surgery Why: Appointment time is at 9:30 am Contact information: [address] [employer] [address] [place] [address] [phone]         [facility]. Go on 02/27/2023.   Why: Please arrive by 8:00 am in order to have a PA/LAT CXR taken PRIOR to office appointment with Dr. Cristy Hilts information: [address] [place] [address]        [facility]. Go on 02/14/2023.   Specialty: Cardiothoracic Surgery Why: Appointment is with nurse only for chest tube suture removal. Appointment time is at 10:00 am Contact information: [address] [place], [address] [place] [address] [phone]        [facility]. Go on 03/14/2023.   Specialty: Cardiology Why: Your follow up ECHOCARDIOGRAM is at 10:30am. Contact information: [address] [place] [address] [phone]        Perlie Gold, PA-C. Go on 02/26/2023.   Specialty: Cardiology Why: Appointment time is at 8:25 am Contact information: [address] [place] [place] [address] [phone]         [facility] Follow up.   Why: [facility] office referral- they will contact you to follow up on [facility] needs. Contact information: [address] Gayla Doss [place], [place] [address]  Phone: [phone]                Signed:  Lowella Dandy, PA-C 02/08/2023, 7:44 AM

## 2023-01-31 NOTE — Anesthesia Procedure Notes (Signed)
Procedure Name: Intubation Date/Time: 01/31/2023 7:58 AM  Performed by: Dairl Ponder, CRNAPre-anesthesia Checklist: Patient identified, Emergency Drugs available, Suction available and Patient being monitored Patient Re-evaluated:Patient Re-evaluated prior to induction Oxygen Delivery Method: Circle System Utilized Preoxygenation: Pre-oxygenation with 100% oxygen Induction Type: IV induction Ventilation: Mask ventilation without difficulty Laryngoscope Size: Mac and 3 Grade View: Grade I Tube type: Oral Tube size: 8.0 mm Number of attempts: 1 Airway Equipment and Method: Stylet and Oral airway Placement Confirmation: ETT inserted through vocal cords under direct vision, positive ETCO2 and breath sounds checked- equal and bilateral Secured at: 22 cm Tube secured with: Tape Dental Injury: Teeth and Oropharynx as per pre-operative assessment

## 2023-01-31 NOTE — H&P (Signed)
[address]       [place] [address]             [phone]      Cardiothoracic Surgery Admission History and Physical   PCP is Trisha Mangle, FNP Referring Provider is Tonny Bollman, MD       Chief Complaint  Patient presents with   Coronary Artery Disease   Aortic Stenosis           HPI:   The patient is a 72 year old woman with history of hypertension, diabetes, right breast carcinoma status post chemotherapy and radiation therapy in 2016, a remote history of rheumatic fever as a child, and moderate aortic stenosis that has been followed by Dr. Odis Hollingshead.  She has developed progressively worsening exertional shortness of breath and fatigue as well as exertional chest discomfort radiating into her arms over the past several months.  She walks daily for about 1-1/2 miles with her husband and dogs and frequently gets symptoms that improve with rest.  She has had occasional episodes of chest discomfort while lying in bed.  She has had some lightheadedness but no syncope.  Her most recent echocardiogram showed an ejection fraction of 60 to 65% with grade 2 diastolic dysfunction.  There was an increase in the mean aortic valve gradient to 38 mmHg with a peak velocity of 4.2 m/s.  Dimensionless index was 0.26 with a valve area of 0.66 cm and a low stroke-volume index of 31.  She was seen by Dr. [name] for consideration of treatment of her severe aortic stenosis and underwent cardiac catheterization yesterday showing severe distal left main and three-vessel coronary disease.         Past Medical History:  Diagnosis Date   Breast cancer (HCC)     Breast cancer of upper-outer quadrant of right female breast (HCC) 07/16/2014   Diabetes mellitus without complication (HCC)     Hypertension     Personal history of chemotherapy 2016   Personal history of radiation therapy     Skin cancer                 Past Surgical History:  Procedure Laterality Date    BREAST BIOPSY Right 07/13/2014    malignant   BREAST LUMPECTOMY Right 02/11/2015    malignant   PORT-A-CATH REMOVAL Left 02/11/2015    Procedure: REMOVAL PORT-A-CATH;  Surgeon: Glenna Fellows, MD;  Location: [facility] OR;  Service: General;  Laterality: Left;   PORTACATH PLACEMENT N/A 08/03/2014    Procedure: INSERTION PORT-A-CATH;  Surgeon: Glenna Fellows, MD;  Location: [facility];  Service: General;  Laterality: N/A;   RADIOACTIVE SEED GUIDED PARTIAL MASTECTOMY WITH AXILLARY SENTINEL LYMPH NODE BIOPSY Right 02/11/2015    Procedure: RIGHT RADIOACTIVE SEED LOCALIZATION LUMPECTOMY  WITH  RIGHT AXILLARY SENTINEL LYMPH NODE BIOPSY;  Surgeon: Glenna Fellows, MD;  Location: [facility] OR;  Service: General;  Laterality: Right;   RIGHT HEART CATH AND CORONARY ANGIOGRAPHY N/A 01/15/2023    Procedure: RIGHT HEART CATH AND CORONARY ANGIOGRAPHY;  Surgeon: Tonny Bollman, MD;  Location: [facility] INVASIVE CV LAB;  Service: Cardiovascular;  Laterality: N/A;   TOTAL KNEE ARTHROPLASTY Left 01/19/2019    Procedure: TOTAL KNEE ARTHROPLASTY;  Surgeon: Ollen Gross, MD;  Location: [facility];  Service: Orthopedics;  Laterality: Left;                Family History  Problem Relation Age of Onset   Heart disease Mother  Lung cancer Father     Heart disease Father     Heart disease Brother            Social History Social History  Social History         Tobacco Use   Smoking status: Former      Current packs/day: 0.00      Average packs/day: 0.5 packs/day for 12.0 years (6.0 ttl pk-yrs)      Types: Cigarettes      Start date: 04/02/1974      Quit date: 04/02/1986      Years since quitting: 36.8   Smokeless tobacco: Never  Vaping Use   Vaping status: Never Used  Substance Use Topics   Alcohol use: Yes      Alcohol/week: 3.0 standard drinks of alcohol      Types: 3 Glasses of wine per week      Comment: occ   Drug use: No              Current Outpatient Medications  Medication Sig Dispense  Refill   aspirin EC 81 MG tablet Take 81 mg by mouth every evening. Swallow whole.       Calcium-Magnesium-Zinc (CAL-MAG-ZINC PO) Take 1 tablet by mouth 2 (two) times daily.       Coenzyme Q10 (COQ10) 400 MG CAPS Take 400 mg by mouth daily.       Cyanocobalamin (B-12) 3000 MCG CAPS Take 3,000 mcg by mouth daily at 12 noon.       gabapentin (NEURONTIN) 300 MG capsule Take 1 capsule (300 mg total) by mouth 3 (three) times daily. Take a 300 mg capsule three times a day for two weeks Then a 300 mg capsule twice a day for two weeks Then a 300 mg capsule once a day for two weeks (Patient taking differently: Take 300 mg by mouth every evening.) 84 capsule 0   Melatonin 10 MG TABS Take 1 tablet by mouth at bedtime.       metFORMIN (GLUCOPHAGE-XR) 500 MG 24 hr tablet Take 1,000 mg by mouth daily with breakfast.       OZEMPIC, 0.25 OR 0.5 MG/DOSE, 2 MG/3ML SOPN Inject 0.5 mg into the skin every Friday.       rosuvastatin (CRESTOR) 40 MG tablet Take 20 mg by mouth 2 (two) times daily.       vitamin C (ASCORBIC ACID) 500 MG tablet Take 500 mg by mouth daily.          No current facility-administered medications for this visit.        Allergies       Allergies  Allergen Reactions   Latex Itching, Dermatitis and Rash   Lipitor [Atorvastatin] Other (See Comments)      Bad leg cramps        Review of Systems  Constitutional:  Positive for fatigue. Negative for activity change.  HENT: Negative.  Negative for dental problem.        Last saw dentist June 2024  Eyes: Negative.   Respiratory:  Positive for shortness of breath.   Cardiovascular:  Positive for chest pain and palpitations. Negative for leg swelling.  Gastrointestinal:  Positive for constipation.  Endocrine: Negative.   Genitourinary: Negative.   Musculoskeletal: Negative.   Skin: Negative.   Allergic/Immunologic: Negative.   Neurological:  Positive for dizziness. Negative for syncope.  Hematological:  Bruises/bleeds easily.   Psychiatric/Behavioral: Negative.        BP (!) 178/96  Pulse 82   Resp 20   Ht 5\' 1"  (1.549 m)   Wt 155 lb (70.3 kg)   SpO2 97% Comment: RA  BMI 29.29 kg/m  Physical Exam Constitutional:      Appearance: Normal appearance.  HENT:     Head: Normocephalic and atraumatic.  Eyes:     Extraocular Movements: Extraocular movements intact.     Conjunctiva/sclera: Conjunctivae normal.     Pupils: Pupils are equal, round, and reactive to light.  Neck:     Vascular: No carotid bruit.  Cardiovascular:     Rate and Rhythm: Normal rate and regular rhythm.     Pulses: Normal pulses.     Heart sounds: Murmur heard.     Comments: 3/6 systolic murmur along the right sternal border.  There is no diastolic murmur. Pulmonary:     Effort: Pulmonary effort is normal.     Breath sounds: Normal breath sounds.  Abdominal:     General: There is no distension.     Tenderness: There is no abdominal tenderness.  Musculoskeletal:        General: No swelling.  Skin:    General: Skin is warm and dry.  Neurological:     General: No focal deficit present.     Mental Status: She is alert and oriented to person, place, and time.  Psychiatric:        Mood and Affect: Mood normal.        Behavior: Behavior normal.        Diagnostic Tests:   ECHOCARDIOGRAM REPORT       Patient Name:   DANIELYS SHUMARD Benning Date of Exam: 12/19/2022  Medical Rec #:  [id number]       Height:       61.0 in  Accession #:    [id number]      Weight:       176.8 lb  Date of Birth:  [birth year]       BSA:          1.792 m  Patient Age:    72 years        BP:           153/90 mmHg  Patient Gender: F               HR:           88 bpm.  Exam Location:  Outpatient   Procedure: 2D Echo, Color Doppler and Cardiac Doppler   Indications:     Aortic Stenosis i35.0    History:         Patient has prior history of Echocardiogram examinations,  most                  recent 11/24/2021. Risk Factors:Hypertension, Diabetes and                    Dyslipidemia. History of chest radiation.    Sonographer:     Irving Burton Senior RDCS  Referring Phys:  [id number] Tessa Lerner  Diagnosing Phys: Tessa Lerner DO   IMPRESSIONS     1. Left ventricular diastolic function could not be evaluated due to  mitral annular calcification (but suggestive of Grade 2 diastolic  dysfunction). Left ventricular ejection fraction, by estimation, is 60 to  65%. The left ventricle has normal function.   The left ventricle has no regional wall motion abnormalities. Elevated  left atrial pressure. The E/e' is 21.   2. Right ventricular systolic  function is normal. The right ventricular  size is normal. There is normal pulmonary artery systolic pressure.   3. Left atrial size was mildly dilated.   4. The mitral valve is degenerative. Mild mitral valve regurgitation. No  evidence of mitral stenosis.   5. Native trileaflet valve, calcified leaflet / annulus, trivial AR,  moderate / severe AS (peak velocity 4.43m/s, PG 71 mmHG, MG , DI  0.26, AVA VTI 0.66cm2).   6. The inferior vena cava is normal in size with greater than 50%  respiratory variability, suggesting right atrial pressure of 3 mmHg.   7. Rhythm strip during this exam demonstrates normal sinus rhythm.   Comparison(s): Prior study 11/23/2021: LVEF 60-65%, Grade 2 Diastolic  dysfunction, elevated LAP, moderate LAE, moderate AS (peak velocity  3.9m/s, MG 21 mmHg, DI 0.3).   FINDINGS   Left Ventricle: Left ventricular diastolic function could not be  evaluated due to mitral annular calcification (but suggestive of Grade 2  diastolic dysfunction). Left ventricular ejection fraction, by estimation,  is 60 to 65%. The left ventricle has  normal function. The left ventricle has no regional wall motion  abnormalities. The left ventricular internal cavity size was normal in  size. There is no left ventricular hypertrophy. Left ventricular diastolic  function could not be evaluated due to  mitral  annular calcification (moderate or greater). Elevated left atrial  pressure. The E/e' is 21.   Right Ventricle: The right ventricular size is normal. No increase in  right ventricular wall thickness. Right ventricular systolic function is  normal. There is normal pulmonary artery systolic pressure. The tricuspid  regurgitant velocity is 2.81 m/s, and   with an assumed right atrial pressure of 3 mmHg, the estimated right  ventricular systolic pressure is 34.6 mmHg.   Left Atrium: Left atrial size was mildly dilated.   Right Atrium: Right atrial size was normal in size.   Pericardium: There is no evidence of pericardial effusion.   Mitral Valve: The mitral valve is degenerative in appearance. There is  moderate thickening of the posterior mitral valve leaflet(s). There is  moderate calcification of the posterior mitral valve leaflet(s). Mild  mitral annular calcification. Mild mitral  valve regurgitation. No evidence of mitral valve stenosis. MV peak  gradient, 9.9 mmHg. The mean mitral valve gradient is 3.8 mmHg.   Tricuspid Valve: The tricuspid valve is grossly normal. Tricuspid valve  regurgitation is trivial. No evidence of tricuspid stenosis.   Aortic Valve: Native trileaflet valve, calcified leaflet / annulus,  trivial AR, moderate / severe AS (peak velocity 4.60m/s, PG 71 mmHG, MG  , DI 0.26, AVA VTI 0.66cm2). Aortic valve mean gradient measures  38.0 mmHg. Aortic valve peak gradient  measures 70.6 mmHg. Aortic valve area, by VTI measures 0.66 cm.   Pulmonic Valve: The pulmonic valve was not well visualized. Pulmonic valve  regurgitation is not visualized. No evidence of pulmonic stenosis.   Aorta: The aortic root and ascending aorta are structurally normal, with  no evidence of dilitation.   Venous: The inferior vena cava is normal in size with greater than 50%  respiratory variability, suggesting right atrial pressure of 3 mmHg.   IAS/Shunts: The atrial septum is  grossly normal.   EKG: Rhythm strip during this exam demonstrates normal sinus rhythm.     LEFT VENTRICLE  PLAX 2D  LVIDd:         4.50 cm   Diastology  LVIDs:         2.90 cm  LV e' medial:   3.05 cm/s  LV PW:         0.90 cm   LV E/e' medial: 21.0  LV IVS:        0.90 cm  LVOT diam:     1.80 cm  LV SV:         55  LV SV Index:   31  LVOT Area:     2.54 cm     RIGHT VENTRICLE  RV S prime:     15.70 cm/s  TAPSE (M-mode): 2.1 cm   LEFT ATRIUM             Index        RIGHT ATRIUM           Index  LA diam:        3.90 cm 2.18 cm/m   RA Area:     12.50 cm  LA Vol (A2C):   89.9 ml 50.16 ml/m  RA Volume:   25.10 ml  14.00 ml/m  LA Vol (A4C):   53.0 ml 29.54 ml/m  LA Biplane Vol: 74.3 ml 41.45 ml/m   AORTIC VALVE  AV Area (Vmax):    0.67 cm  AV Area (Vmean):   0.75 cm  AV Area (VTI):     0.66 cm  AV Vmax:           420.00 cm/s  AV Vmean:          280.000 cm/s  AV VTI:            0.835 m  AV Peak Grad:      70.6 mmHg  AV Mean Grad:      38.0 mmHg  LVOT Vmax:         110.00 cm/s  LVOT Vmean:        82.000 cm/s  LVOT VTI:          0.218 m  LVOT/AV VTI ratio: 0.26    AORTA  Ao Root diam: 2.70 cm  Ao Asc diam:  3.10 cm   MITRAL VALVE                TRICUSPID VALVE  MV Area (PHT): 1.81 cm     TR Peak grad:   31.6 mmHg  MV Area VTI:   1.85 cm     TR Vmax:        281.00 cm/s  MV Peak grad:  9.9 mmHg  MV Mean grad:  3.8 mmHg     SHUNTS  MV Vmax:       1.57 m/s     Systemic VTI:  0.22 m  MV Vmean:      91.1 cm/s    Systemic Diam: 1.80 cm  MV Decel Time: 420 msec  MV E velocity: 64.20 cm/s  MV A velocity: 169.00 cm/s  MV E/A ratio:  0.38   Sunit Tolia DO  Electronically signed by Tessa Lerner DO  Signature Date/Time: 12/24/2022/10:01:49 PM        Final        Physicians   Panel Physicians Referring Physician Case Authorizing Physician  Tonny Bollman, MD (Primary)        Procedures   RIGHT HEART CATH AND CORONARY ANGIOGRAPHY    Conclusion   1.   Known severe aortic stenosis on noninvasive assessment 2.  Heavy mitral annular calcification 3.  Severe RCA stenosis (large dominant vessel) 4.  Severe distal left main/ostial circumflex stenosis 5.  Moderate eccentric  proximal to mid LAD stenosis   Recommendations: Surgical consultation for CABG/AVR   Indications   Severe aortic stenosis [I35.0 (ICD-10-CM)]    Procedural Details   Technical Details INDICATION: 72 year old woman diagnosed with severe symptomatic aortic stenosis with both exertional dyspnea and angina.  She has been noted to have progressive aortic stenosis by echo assessment.  She was seen in structural heart consultation and referred for preoperative cardiac catheterization.  PROCEDURAL DETAILS: There was an indwelling IV in a left antecubital vein. Using normal sterile technique, the IV was changed out for a 5 Fr brachial sheath over a 0.018 inch wire. The left wrist was then prepped, draped, and anesthetized with 1% lidocaine. Using the modified Seldinger technique a 5/6 French Slender sheath was placed in the left radial artery. Intra-arterial verapamil was administered through the radial artery sheath. IV heparin was administered after a JR4 catheter was advanced into the central aorta. A Swan-Ganz catheter was used for the right heart catheterization. Standard protocol was followed for recording of right heart pressures and sampling of oxygen saturations. Fick cardiac output was calculated. Standard Judkins catheters were used for selective coronary angiography.  No attempt was made to cross the aortic valve.  There were no immediate procedural complications. The patient was transferred to the post catheterization recovery area for further monitoring.      Estimated blood loss <50 mL.   During this procedure medications were administered to achieve and maintain moderate conscious sedation while the patient's heart rate, blood pressure, and oxygen saturation were  continuously monitored and I was present face-to-face 100% of this time.    Medications (Filter: Administrations occurring from [phone] to 0916 on 01/15/23) fentaNYL (SUBLIMAZE) injection (mcg)  Total dose: 50 mcg Date/Time Rate/Dose/Volume Action    01/15/23 0820 25 mcg Given    0835 25 mcg Given    midazolam (VERSED) injection (mg)  Total dose: 2 mg Date/Time Rate/Dose/Volume Action    01/15/23 0820 1 mg Given    0835 1 mg Given    Heparin (Porcine) in NaCl 2000-0.9 UNIT/L-% SOLN (mL)  Total volume: 1,000 mL Date/Time Rate/Dose/Volume Action    01/15/23 0821 1,000 mL Given    lidocaine (PF) (XYLOCAINE) 1 % injection (mL)  Total volume: 4 mL Date/Time Rate/Dose/Volume Action    01/15/23 0828 2 mL Given    0831 2 mL Given    Radial Cocktail/Verapamil only (mL)  Total volume: 10 mL Date/Time Rate/Dose/Volume Action    01/15/23 0834 10 mL Given    heparin sodium (porcine) injection (Units)  Total dose: 4,000 Units Date/Time Rate/Dose/Volume Action    01/15/23 0853 4,000 Units Given    iohexol (OMNIPAQUE) 350 MG/ML injection (mL)  Total volume: 25 mL Date/Time Rate/Dose/Volume Action    01/15/23 0900 25 mL Given      Sedation Time   Sedation Time Physician-1: 35 minutes 21 seconds Contrast        Administrations occurring from 0808 to 0916 on 01/15/23:  Medication Name Total Dose  iohexol (OMNIPAQUE) 350 MG/ML injection 25 mL    Radiation/Fluoro   Fluoro time: 3.6 (min) DAP: 12308 (mGycm2) Cumulative Air Kerma: 233 (mGy) Complications   Complications documented before study signed (01/15/2023  9:22 AM)    No complications were associated with this study.  Documented by Bethanie Dicker D - 01/15/2023  8:57 AM      Coronary Findings   Diagnostic Dominance: Right Left Main  Mid LM to Dist LM lesion is 50% stenosed. The lesion is  eccentric. The lesion is calcified.    Left Anterior Descending  Ost LAD to Prox LAD lesion is 70% stenosed. The lesion is eccentric.   Prox LAD to Mid LAD lesion is 50% stenosed.    Left Circumflex  The circumflex has a tight ostial stenosis involving an eccentric left main lesion extending into the circumflex ostium. The circumflex supplies a large OM branch that has mild plaquing but no significant stenosis.  Ost Cx to Prox Cx lesion is 80% stenosed.  Mid Cx lesion is 50% stenosed.    Right Coronary Artery  The right coronary artery is a large, dominant vessel. It supplies a twin PDA and a large posterolateral branch. The vessel is diffusely calcified. There is aortic calcification at the ostium of the RCA. The mid vessel has 70% stenosis followed by a tight 90 to 95% stenosis  Prox RCA lesion is 70% stenosed.  Mid RCA lesion is 90% stenosed. The lesion is calcified.    Intervention    No interventions have been documented.    Coronary Diagrams   Diagnostic Dominance: Right  Intervention    Implants    No implant documentation for this case.    Syngo Images    Show images for CARDIAC CATHETERIZATION Images on Long Term Storage    Show images for Elide, Bouche to Procedure Log   Procedure Log    Link to Procedure Log   Procedure Log    Hemo Data   Flowsheet Row Most Recent Value  Fick Cardiac Output 4.83 L/min  Fick Cardiac Output Index 2.86 (L/min)/BSA  RA A Wave 8 mmHg  RA V Wave 6 mmHg  RA Mean 6 mmHg  RV Systolic Pressure 37 mmHg  RV Diastolic Pressure -1 mmHg  RV EDP 8 mmHg  PA Systolic Pressure 34 mmHg  PA Diastolic Pressure 10 mmHg  PA Mean 20 mmHg  PW A Wave 10 mmHg  PW V Wave 9 mmHg  PW Mean 8 mmHg  AO Systolic Pressure 136 mmHg  AO Diastolic Pressure 66 mmHg  AO Mean 92 mmHg  QP/QS 1  TPVR Index 7 HRUI  TSVR Index 32.22 HRUI  PVR SVR Ratio 0.14  TPVR/TSVR Ratio 0.22      Impression:   This 72 year old woman has stage D, severe, symptomatic aortic stenosis and high-grade left main and three-vessel coronary disease with NYHA class III symptoms of exertional  fatigue and shortness of breath as well as angina.  She had a history of rheumatic fever as a child and has had radiation to the chest for breast cancer.  CTA of the chest shows a small amount of calcium around the STJ and arch but should be fine. I agree that coronary artery bypass graft surgery and open surgical aortic valve replacement would be the best option for treating her.  I reviewed the echo and cardiac catheterization images with her and her husband and answered all their questions. I discussed the operative procedure with the patient and her husband including alternatives, benefits and risks; including but not limited to bleeding, blood transfusion, infection, stroke, myocardial infarction, graft failure, heart block requiring a permanent pacemaker, organ dysfunction, and death.  Alroy Bailiff understands and agrees to proceed.       Plan:   Aortic valve replacement using a bioprosthetic valve and coronary artery bypass graft surgery.     Alleen Borne, MD [facility] [phone]

## 2023-01-31 NOTE — Brief Op Note (Signed)
01/31/2023  12:12 PM  PATIENT:  Darlene Kelly  72 y.o. female  PRE-OPERATIVE DIAGNOSIS:  1. CORONARY ARTERY DISEASE, 2. SEVERE AORTIC STENOSIS  POST-OPERATIVE DIAGNOSIS:  1. CORONARY ARTERY DISEASE, 2. SEVERE AORTIC STENOSIS  PROCEDURE:  TRANSESOPHAGEAL ECHOCARDIOGRAM, CORONARY ARTERY BYPASS GRAFTING (CABG)X THREE (LIMA to LAD, SVG to OM, SVG to RCA) USING LEFT INTERNAL MAMMARY ARTERY AND RIGHT GREATER SAPHENEOUS VEIN HARVESTED ENDOSCOPICLY , AORTIC VALVE REPLACEMENT (AVR) (USING an Inspiris Resilia, Model # 11500A, Serial # [id number], Size 21 MM)   Vein harvest time: 38 min Vein prep time:  14 min  SURGEON:  Surgeons and Role:    Alleen Borne, MD - Primary  PHYSICIAN ASSISTANT: Doree Fudge PA-C  ASSISTANTS: Tanda Rockers RNFA   ANESTHESIA:   general  EBL:  Per anesthesia, perfusion record  DRAINS:  Chest tubes placed in the mediastinal and pleural spaces    SPECIMEN:  Source of Specimen:  Native aortic valve leaflets  DISPOSITION OF SPECIMEN:  PATHOLOGY  COUNTS CORRECT:  YES  DICTATION: .Dragon Dictation  PLAN OF CARE: Admit to inpatient   PATIENT DISPOSITION:  ICU - intubated and hemodynamically stable.   Delay start of Pharmacological VTE agent (>24hrs) due to surgical blood loss or risk of bleeding: no  BASELINE WEIGHT: 68.5 kg

## 2023-01-31 NOTE — Progress Notes (Signed)
Orders placed for post op echo

## 2023-01-31 NOTE — Procedures (Signed)
Extubation Procedure Note  Patient Details:   Name: Darlene Kelly DOB: [birth year] MRN: [id number]   Airway Documentation:    Vent end date: 01/31/23 Vent end time: 1755   Evaluation  O2 sats: stable throughout Complications: No apparent complications Patient did tolerate procedure well. Bilateral Breath Sounds: Clear, Diminished  Patient extubated and placed on 4L [place], tolerating well at this time. Cuff leak was heard prior to extubation, no stridor noted post. Patient was able to perform NIF - 25, VC 1.6L with great effort. Patient able to speak and is aware of time and place.      Yes   Bran Aldridge Remonia Richter 01/31/2023, 6:02 PM

## 2023-01-31 NOTE — Anesthesia Postprocedure Evaluation (Signed)
Anesthesia Post Note  Patient: JAMELIA GOERTZEN  Procedure(s) Performed: AORTIC VALVE REPLACEMENT (AVR) USING 21 MM INSPIRIS RESILIA AORTIC VALVE (Chest) CORONARY ARTERY BYPASS GRAFTING (CABG)X THREE USING LEFT INTERNAL MAMMARY ARTERY AND RIGHT GREATER SAPHENEOUS VEIN HARVESTED ENDOSCOPICLY (Chest) TRANSESOPHAGEAL ECHOCARDIOGRAM     Patient location during evaluation: SICU Anesthesia Type: General Level of consciousness: patient remains intubated per anesthesia plan Pain management: pain level controlled Vital Signs Assessment: post-procedure vital signs reviewed and stable Respiratory status: patient on ventilator - see flowsheet for VS and patient remains intubated per anesthesia plan Cardiovascular status: stable (on low dose phenylephrine infusion) Postop Assessment: no apparent nausea or vomiting Anesthetic complications: no   No notable events documented.  Last Vitals:  Vitals:   01/31/23 0721 01/31/23 1435  BP:    Pulse: 78   Resp: 15   Temp:    SpO2: 96% 100%    Last Pain:  Vitals:   01/31/23 0618  TempSrc:   PainSc: 0-No pain                 Rayaan Lorah,E. Aslynn Brunetti

## 2023-01-31 NOTE — Interval H&P Note (Signed)
History and Physical Interval Note:  01/31/2023 6:26 AM  Darlene Kelly  has presented today for surgery, with the diagnosis of CAD AS.  The various methods of treatment have been discussed with the patient and family. After consideration of risks, benefits and other options for treatment, the patient has consented to  Procedure(s): AORTIC VALVE REPLACEMENT (AVR) (N/A) CORONARY ARTERY BYPASS GRAFTING (CABG) (N/A) TRANSESOPHAGEAL ECHOCARDIOGRAM (N/A) as a surgical intervention.  The patient's history has been reviewed, patient examined, no change in status, stable for surgery.  I have reviewed the patient's chart and labs.  Questions were answered to the patient's satisfaction.     Alleen Borne

## 2023-01-31 NOTE — Op Note (Signed)
CARDIOVASCULAR SURGERY OPERATIVE NOTE  01/31/2023  Surgeon:  Alleen Borne, MD  First Assistant: Doree Fudge,  PA-C:   An experienced assistant was required given the complexity of this surgery and the standard of surgical care. The assistant was needed for endoscopic vein harvest, exposure, dissection, suctioning, retraction of delicate tissues and sutures, instrument exchange and for overall help during this procedure.   Preoperative Diagnosis:  Severe multi-vessel coronary artery disease and moderate to severe aortic stenosis.   Postoperative Diagnosis:  Same   Procedure:  Median Sternotomy Extracorporeal circulation 3.   Coronary artery bypass grafting x 3  Left internal mammary artery graft to the LAD SVG to OM SVG to RCA  4.   Endoscopic vein harvest from the right leg 5.   Aortic valve replacement using a 21 mm Edwards INSPIRIS RESILIA pericardial valve.  Anesthesia:  General Endotracheal   Clinical History/Surgical Indication:  This 72 year old woman has stage D, severe, symptomatic aortic stenosis and high-grade left main and three-vessel coronary disease with NYHA class III symptoms of exertional fatigue and shortness of breath as well as angina.  She had a history of rheumatic fever as a child and has had radiation to the chest for breast cancer.  CTA of the chest shows a small amount of calcium around the STJ and arch but should be fine. I agree that coronary artery bypass graft surgery and open surgical aortic valve replacement would be the best option for treating her.  I reviewed the echo and cardiac catheterization images with her and her husband and answered all their questions. I discussed the operative procedure with the patient and her husband including alternatives, benefits and risks; including but not limited to bleeding, blood transfusion, infection, stroke,  myocardial infarction, graft failure, heart block requiring a permanent pacemaker, organ dysfunction, and death.  Alroy Bailiff understands and agrees to proceed.      Preparation:  The patient was seen in the preoperative holding area and the correct patient, correct operation were confirmed with the patient after reviewing the medical record and catheterization. The consent was signed by me. Preoperative antibiotics were given. A pulmonary arterial line and radial arterial line were placed by the anesthesia team. The patient was taken back to the operating room and positioned supine on the operating room table. After being placed under general endotracheal anesthesia by the anesthesia team a foley catheter was placed. The neck, chest, abdomen, and both legs were prepped with betadine soap and solution and draped in the usual sterile manner. A surgical time-out was taken and the correct patient and operative procedure were confirmed with the nursing and anesthesia staff.   Cardiopulmonary Bypass:  A median sternotomy was performed. The pericardium was opened in the midline. Right ventricular function appeared normal. The ascending aorta was of normal size and had some palpable calcified plaque in the aortic root anteriorly as well as along the medial wall of the ascending aorta. There was also calcified plaque around the ostium of the innominate artery. There were no contraindications to aortic cannulation or cross-clamping. The patient was fully systemically heparinized and the ACT was maintained > 400 sec. The proximal aortic arch was cannulated with a 20 F aortic cannula for arterial inflow. Venous cannulation was performed via the right atrial appendage using a two-staged venous cannula. An antegrade cardioplegia/vent cannula was inserted into the mid-ascending aorta. A left ventricular vent was placed via the right superior pulmonary vein. A retrograde cadioplegia cannula was placed via the right  atrium into the coronary sinus. Aortic occlusion was performed with a single cross-clamp. Systemic cooling to 32 degrees Centigrade and topical cooling of the heart with iced saline were used. Cold antegrade KBC cardioplegia was used to induce diastolic arrest and was then given retrograde at about 60 minute intervals throughout the period of arrest. A temperature probe was inserted into the interventricular septum and an insulating pad was placed in the pericardium.   Left internal mammary artery harvest:  The left side of the sternum was retracted using the Rultract retractor. The left internal mammary artery was harvested as a pedicle graft. All side branches were clipped. It was a medium-sized vessel of good quality with excellent blood flow. It was ligated distally and divided. It was sprayed with topical papaverine solution to prevent vasospasm.   Endoscopic vein harvest: Performed by Doree Fudge, PA-C  The right greater saphenous vein was harvested endoscopically through a 2 cm incision medial to the right knee. It was harvested from the upper thigh to below the knee. It was a medium-sized vein of good quality. The side branches were all ligated with 4-0 silk ties.    Coronary arteries:  The coronary arteries were examined.  LAD:  large vessel that had mild disease in the mid to distal vessel, mostly posterior plaque. LCX:  The OM was large and heavily diseased proximally but the mid portion just before the bifurcation was soft with minimal disease. RCA:  Diffusely diseased with calcific plaque. The vessel was graftable just before the origin of the PDA.   Grafts: All bypasses performed by me and assisted by Doree Fudge, PA-C  LIMA to the LAD: 2.5 mm. It was sewn end to side using 8-0 prolene continuous suture. SVG to OM:  2.5 mm. It was sewn end to side using 7-0 prolene continuous suture. SVG to RCA:  2.5 mm. It was sewn end to side using 7-0 prolene continuous  suture.   The proximal vein graft anastomoses were performed to the mid-ascending aorta using continuous 6-0 prolene suture. Graft markers were placed around the proximal anastomoses.  Aortic Valve Replacement: Performed by me and assisted by Doree Fudge, PA-C.  A transverse aortotomy was performed 2 cm above the take-off of the right coronary artery. The native valve was tricuspid with calcified leaflets and moderate annular calcification. The ostia of the coronary arteries were in normal position and were not obstructed. The native valve leaflets were excised and the annulus was decalcified with rongeurs. Care was taken to remove all particulate debris. The left ventricle was directly inspected for debris and then irrigated with ice saline solution. The annulus was sized and a size 21 mm Edwards INSPIRIS RESILIA pericardial valve was chosen. The model number was [id number] and the serial number was [id number].  While the valve was being prepared 2-0 Ethibond pledgeted horizontal mattress sutures were placed around the annulus with the pledgets in a sub-annular position. The sutures were placed through the sewing ring and the valve lowered into place. The sutures were tied using CorKnots. The valve seated nicely and the coronary ostia were not obstructed. The prosthetic valve leaflets moved normally and there was no sub-valvular obstruction. The aortotomy was closed using 4-0 Prolene suture in 2 layers with felt strips to reinforce the closure.   Completion:  The patient was rewarmed to 37 degrees Centigrade. A dose of warm retrograde reanimation cardioplegia was given. The clamp was removed from the LIMA pedicle and there was rapid warming of the septum and  there was slow return of sinus bradycardia. The crossclamp was removed with a time of 141 minutes. There was spontaneous return of sinus rhythm. The distal and proximal anastomoses were checked for hemostasis. The position of the grafts was  satisfactory. Two temporary epicardial pacing wires were placed on the right atrium and two on the right ventricle. The patient was weaned from CPB without difficulty on no inotropes. CPB time was 172 minutes. Cardiac output was 4.5 LPM. TEE showed normal LV systolic function with an EF of 60%. The aortic valve prosthesis was functioning normally with no paravalvular leak or regurgitation. There was unchanged trace central MR. Heparin was fully reversed with protamine and the aortic and venous cannulas removed. Hemostasis was achieved. Mediastinal and bilateral pleural drainage tubes were placed. The sternum was closed with double #6 stainless steel wires. The fascia was closed with continuous # 1 vicryl suture. The subcutaneous tissue was closed with 2-0 vicryl continuous suture. The skin was closed with 3-0 vicryl subcuticular suture. All sponge, needle, and instrument counts were reported correct at the end of the case. Dry sterile dressings were placed over the incisions and around the chest tubes which were connected to pleurevac suction. The patient was then transported to the surgical intensive care unit in stable condition.

## 2023-01-31 NOTE — Progress Notes (Signed)
      [address]       [place] [address]             [phone]    S/p AVR, CABG x 3  Extubated  BP 119/70   Pulse 85   Temp (!) 97.2 F (36.2 C)   Resp 19   Ht 5\' 1"  (1.549 m)   Wt 68.5 kg   SpO2 97%   BMI 28.53 kg/m  Neo @ 20  32/17 CI 1.7   Intake/Output Summary (Last 24 hours) at 01/31/2023 1801 Last data filed at 01/31/2023 1700 Gross per 24 hour  Intake 3323.02 ml  Output 3222 ml  Net 101.02 ml   Minimal CT output Hct 36  Doing well early postop  Viviann Spare C. Dorris Fetch, MD [facility] [phone]

## 2023-01-31 NOTE — Discharge Instructions (Addendum)
Discharge Instructions: ? ?1. You may shower, please wash incisions daily with soap and water and keep dry.  If you wish to cover wounds with dressing you may do so but please keep clean and change daily.  No tub baths or swimming until incisions have completely healed.  If your incisions become red or develop any drainage please call our office at [phone] ? ?2. No Driving until cleared by Dr. Sharee Pimple office and you are no longer using narcotic pain medications ? ?3. Monitor your weight daily.. Please use the same scale and weigh at same time... If you gain 5-10 lbs in 48 hours with associated lower extremity swelling, please contact our office at [phone] ? ?4. Fever of 101.5 for at least 24 hours with no source, please contact our office at [phone] ? ?5. Activity- up as tolerated, please walk at least 3 times per day.  Avoid strenuous activity, no lifting, pushing, or pulling with your arms over 8-10 lbs for a minimum of 6 weeks ? ?6. If any questions or concerns arise, please do not hesitate to contact our office at [phone] ? ?------------------------------------------------ ?Information on my medicine - Coumadin?   (Warfarin) ? ?Why was Coumadin prescribed for you? ?Coumadin was prescribed for you because you have a blood clot or a medical condition that can cause an increased risk of forming blood clots. Blood clots can cause serious health problems by blocking the flow of blood to the heart, lung, or brain. Coumadin can prevent harmful blood clots from forming. ?As a reminder your indication for Coumadin is:  Stroke Prevention because of Atrial Fibrillation ? ?What test will check on my response to Coumadin? ?While on Coumadin (warfarin) you will need to have an INR test regularly to ensure that your dose is keeping you in the desired range. The INR (international normalized ratio) number is calculated from the result of the laboratory test called prothrombin time (PT). ? ?If an INR  APPOINTMENT HAS NOT ALREADY BEEN MADE FOR YOU please schedule an appointment to have this lab work done by your health care provider within 7 days. ?Your INR goal is usually a number between:  2 to 3 or your provider may give you a more narrow range like 2-2.5.  Ask your health care provider during an office visit what your goal INR is. ? ?What  do you need to  know  About  COUMADIN? ?Take Coumadin (warfarin) exactly as prescribed by your healthcare provider about the same time each day.  DO NOT stop taking without talking to the doctor who prescribed the medication.  Stopping without other blood clot prevention medication to take the place of Coumadin may increase your risk of developing a new clot or stroke.  Get refills before you run out. ? ?What do you do if you miss a dose? ?If you miss a dose, take it as soon as you remember on the same day then continue your regularly scheduled regimen the next day.  Do not take two doses of Coumadin at the same time. ? ?Important Safety Information ?A possible side effect of Coumadin (Warfarin) is an increased risk of bleeding. You should call your healthcare provider right away if you experience any of the following: ?Bleeding from an injury or your nose that does not stop. ?Unusual colored urine (red or dark brown) or unusual colored stools (red or black). ?Unusual bruising for unknown reasons. ?A serious fall or if you hit your head (even if there is no bleeding). ? ?  Some foods or medicines interact with Coumadin? (warfarin) and might alter your response to warfarin. To help avoid this: ?Eat a balanced diet, maintaining a consistent amount of Vitamin K. ?Notify your provider about major diet changes you plan to make. ?Avoid alcohol or limit your intake to 1 drink for women and 2 drinks for men per day. ?(1 drink is 5 oz. wine, 12 oz. beer, or 1.5 oz. liquor.) ? ?Make sure that ANY health care provider who prescribes medication for you knows that you are taking Coumadin  (warfarin).  Also make sure the healthcare provider who is monitoring your Coumadin knows when you have started a new medication including herbals and non-prescription products. ? ?Coumadin? (Warfarin)  Major Drug Interactions  ?Increased Warfarin Effect Decreased Warfarin Effect  ?Alcohol (large quantities) ?Antibiotics (esp. Septra/Bactrim, Flagyl, Cipro) ?Amiodarone (Cordarone) ?Aspirin (ASA) ?Cimetidine (Tagamet) ?Megestrol (Megace) ?NSAIDs (ibuprofen, naproxen, etc.) ?Piroxicam (Feldene) ?Propafenone (Rythmol SR) ?Propranolol (Inderal) ?Isoniazid (INH) ?Posaconazole (Noxafil) Barbiturates (Phenobarbital) ?Carbamazepine (Tegretol) ?Chlordiazepoxide (Librium) ?Cholestyramine (Questran) ?Griseofulvin ?Oral Contraceptives ?Rifampin ?Sucralfate (Carafate) ?Vitamin K  ? ?Coumadin? (Warfarin) Major Herbal Interactions  ?Increased Warfarin Effect Decreased Warfarin Effect  ?Garlic ?Ginseng ?Ginkgo biloba Coenzyme Q10 ?Green tea ?St. John?s wort   ? ?Coumadin? (Warfarin) FOOD Interactions  ?Eat a consistent number of servings per week of foods HIGH in Vitamin K ?(1 serving = ? cup)  ?Collards (cooked, or boiled & drained) ?Kale (cooked, or boiled & drained) ?Mustard greens (cooked, or boiled & drained) ?Parsley *serving size only = ? cup ?Spinach (cooked, or boiled & drained) ?Swiss chard (cooked, or boiled & drained) ?Turnip greens (cooked, or boiled & drained)  ?Eat a consistent number of servings per week of foods MEDIUM-HIGH in Vitamin K ?(1 serving = 1 cup)  ?Asparagus (cooked, or boiled & drained) ?Broccoli (cooked, boiled & drained, or raw & chopped) ?Brussel sprouts (cooked, or boiled & drained) *serving size only = ? cup ?Lettuce, raw (green leaf, endive, romaine) ?Spinach, raw ?Turnip greens, raw & chopped  ? ?These websites have more information on Coumadin (warfarin):  http://www.king-[place]ell.com/; ?www.http://hill-[place].org/; ? ? ?

## 2023-01-31 NOTE — Transfer of Care (Signed)
Immediate Anesthesia Transfer of Care Note  Patient: Darlene Kelly  Procedure(s) Performed: AORTIC VALVE REPLACEMENT (AVR) USING 21 MM INSPIRIS RESILIA AORTIC VALVE (Chest) CORONARY ARTERY BYPASS GRAFTING (CABG)X THREE USING LEFT INTERNAL MAMMARY ARTERY AND RIGHT GREATER SAPHENEOUS VEIN HARVESTED ENDOSCOPICLY (Chest) TRANSESOPHAGEAL ECHOCARDIOGRAM  Patient Location: SICU  Anesthesia Type:General  Level of Consciousness: Patient remains intubated per anesthesia plan  Airway & Oxygen Therapy: Patient remains intubated per anesthesia plan  Post-op Assessment: Report given to RN and Post -op Vital signs reviewed and stable  Post vital signs: Reviewed and stable  Last Vitals:  Vitals Value Taken Time  BP    Temp 36.6 C 01/31/23 1501  Pulse 88 01/31/23 1501  Resp 21 01/31/23 1501  SpO2 96 % 01/31/23 1501  Vitals shown include unfiled device data.  Last Pain:  Vitals:   01/31/23 0618  TempSrc:   PainSc: 0-No pain         Complications: No notable events documented.

## 2023-01-31 NOTE — Anesthesia Procedure Notes (Signed)
Central Venous Catheter Insertion Performed by: Jairo Ben, MD, anesthesiologist Start/End10/31/2024 6:58 AM, 01/31/2023 7:09 AM Patient location: Pre-op. Preanesthetic checklist: patient identified, IV checked, site marked, risks and benefits discussed, surgical consent, monitors and equipment checked, pre-op evaluation, timeout performed and anesthesia consent Position: supine Hand hygiene performed , maximum sterile barriers used  and Seldinger technique used Catheter size: 8.5 Fr PA cath was placed.Sheath introducer Swan type:thermodilution Procedure performed using ultrasound guided technique. Ultrasound Notes:anatomy identified, needle tip was noted to be adjacent to the nerve/plexus identified, no ultrasound evidence of intravascular and/or intraneural injection and image(s) printed for medical record Attempts: 1 Following insertion, line sutured, dressing applied and Biopatch. Post procedure assessment: free fluid flow, blood return through all ports and no air  Patient tolerated the procedure well with no immediate complications. Additional procedure comments: PA catheter:  Routine monitors. Timeout, sterile prep, drape, FBP R neck.  Supine position.  1% Lido local, finder and trocar RIJ 1st pass with US guidance.  Cordis placed over J wire. PA catheter in easily.  Sterile dressing applied.  Patient tolerated well, VSS.  Sandford Craze, MD.

## 2023-02-01 ENCOUNTER — Inpatient Hospital Stay (HOSPITAL_COMMUNITY): Payer: Medicare Other

## 2023-02-01 ENCOUNTER — Encounter (HOSPITAL_COMMUNITY): Payer: Self-pay | Admitting: Surgery

## 2023-02-01 LAB — TYPE AND SCREEN
ABO/RH(D): A POS
Antibody Screen: NEGATIVE
Unit division: 0
Unit division: 0

## 2023-02-01 LAB — CBC
HCT: 32.5 % — ABNORMAL LOW (ref 36.0–46.0)
HCT: 33.8 % — ABNORMAL LOW (ref 36.0–46.0)
Hemoglobin: 11.1 g/dL — ABNORMAL LOW (ref 12.0–15.0)
Hemoglobin: 11.3 g/dL — ABNORMAL LOW (ref 12.0–15.0)
MCH: 31.3 pg (ref 26.0–34.0)
MCH: 30.5 pg (ref 26.0–34.0)
MCHC: 34.2 g/dL (ref 30.0–36.0)
MCHC: 33.4 g/dL (ref 30.0–36.0)
MCV: 91.5 fL (ref 80.0–100.0)
MCV: 91.1 fL (ref 80.0–100.0)
Platelets: 136 10*3/uL — ABNORMAL LOW (ref 150–400)
Platelets: 115 10*3/uL — ABNORMAL LOW (ref 150–400)
RBC: 3.55 MIL/uL — ABNORMAL LOW (ref 3.87–5.11)
RBC: 3.71 MIL/uL — ABNORMAL LOW (ref 3.87–5.11)
RDW: 14.7 % (ref 11.5–15.5)
RDW: 15.1 % (ref 11.5–15.5)
WBC: 12.8 10*3/uL — ABNORMAL HIGH (ref 4.0–10.5)
WBC: 12.9 10*3/uL — ABNORMAL HIGH (ref 4.0–10.5)
nRBC: 0 % (ref 0.0–0.2)
nRBC: 0 % (ref 0.0–0.2)

## 2023-02-01 LAB — BASIC METABOLIC PANEL
Anion gap: 8 (ref 5–15)
Anion gap: 10 (ref 5–15)
BUN: 6 mg/dL — ABNORMAL LOW (ref 8–23)
BUN: 8 mg/dL (ref 8–23)
CO2: 20 mmol/L — ABNORMAL LOW (ref 22–32)
CO2: 24 mmol/L (ref 22–32)
Calcium: 7.9 mg/dL — ABNORMAL LOW (ref 8.9–10.3)
Calcium: 8.9 mg/dL (ref 8.9–10.3)
Chloride: 109 mmol/L (ref 98–111)
Chloride: 105 mmol/L (ref 98–111)
Creatinine, Ser: 0.84 mg/dL (ref 0.44–1.00)
Creatinine, Ser: 1 mg/dL (ref 0.44–1.00)
GFR, Estimated: >60 mL/min (ref >60)
GFR, Estimated: >60 mL/min (ref >60)
Glucose, Bld: 118 mg/dL — ABNORMAL HIGH (ref 70–99)
Glucose, Bld: 142 mg/dL — ABNORMAL HIGH (ref 70–99)
Potassium: 3.8 mmol/L (ref 3.5–5.1)
Potassium: 3.4 mmol/L — ABNORMAL LOW (ref 3.5–5.1)
Sodium: 137 mmol/L (ref 135–145)
Sodium: 139 mmol/L (ref 135–145)

## 2023-02-01 LAB — BPAM RBC
Blood Product Expiration Date: 202411232359
Blood Product Expiration Date: 202411232359
ISSUE DATE / TIME: 202410311013
ISSUE DATE / TIME: 202410311013
Unit Type and Rh: 6200
Unit Type and Rh: 6200

## 2023-02-01 LAB — GLUCOSE, CAPILLARY
Glucose-Capillary: 118 mg/dL — ABNORMAL HIGH (ref 70–99)
Glucose-Capillary: 121 mg/dL — ABNORMAL HIGH (ref 70–99)
Glucose-Capillary: 124 mg/dL — ABNORMAL HIGH (ref 70–99)
Glucose-Capillary: 128 mg/dL — ABNORMAL HIGH (ref 70–99)
Glucose-Capillary: 131 mg/dL — ABNORMAL HIGH (ref 70–99)
Glucose-Capillary: 131 mg/dL — ABNORMAL HIGH (ref 70–99)
Glucose-Capillary: 138 mg/dL — ABNORMAL HIGH (ref 70–99)
Glucose-Capillary: 146 mg/dL — ABNORMAL HIGH (ref 70–99)
Glucose-Capillary: 147 mg/dL — ABNORMAL HIGH (ref 70–99)
Glucose-Capillary: 148 mg/dL — ABNORMAL HIGH (ref 70–99)
Glucose-Capillary: 153 mg/dL — ABNORMAL HIGH (ref 70–99)

## 2023-02-01 LAB — MAGNESIUM
Magnesium: 2.5 mg/dL — ABNORMAL HIGH (ref 1.7–2.4)
Magnesium: 2.3 mg/dL (ref 1.7–2.4)

## 2023-02-01 LAB — SURGICAL PATHOLOGY

## 2023-02-01 MED ORDER — FUROSEMIDE 10 MG/ML IJ SOLN
40.0000 mg | Freq: Once | INTRAMUSCULAR | Status: AC
  Administered 2023-02-01: 40 mg via INTRAVENOUS
  Filled 2023-02-01: qty 4

## 2023-02-01 MED ORDER — INSULIN DETEMIR 100 UNIT/ML ~~LOC~~ SOLN
15.0000 [IU] | Freq: Every day | SUBCUTANEOUS | Status: DC
  Administered 2023-02-01 – 2023-02-02 (×2): 15 [IU] via SUBCUTANEOUS
  Filled 2023-02-01 (×4): qty 0.15

## 2023-02-01 MED ORDER — POTASSIUM CHLORIDE CRYS ER 20 MEQ PO TBCR
20.0000 meq | EXTENDED_RELEASE_TABLET | ORAL | Status: AC
  Administered 2023-02-01 – 2023-02-02 (×3): 20 meq via ORAL
  Filled 2023-02-01 (×3): qty 1

## 2023-02-01 MED ORDER — ENOXAPARIN SODIUM 40 MG/0.4ML IJ SOSY
40.0000 mg | PREFILLED_SYRINGE | Freq: Every day | INTRAMUSCULAR | Status: DC
  Administered 2023-02-01 – 2023-02-03 (×3): 40 mg via SUBCUTANEOUS
  Filled 2023-02-01 (×3): qty 0.4

## 2023-02-01 MED ORDER — INSULIN ASPART 100 UNIT/ML IJ SOLN
0.0000 [IU] | INTRAMUSCULAR | Status: DC
Start: 2023-02-01 — End: 2023-02-03
  Administered 2023-02-01 (×3): 2 [IU] via SUBCUTANEOUS
  Administered 2023-02-02: 4 [IU] via SUBCUTANEOUS
  Administered 2023-02-02 (×3): 2 [IU] via SUBCUTANEOUS
  Administered 2023-02-02 (×2): 4 [IU] via SUBCUTANEOUS

## 2023-02-01 NOTE — Plan of Care (Signed)
?  Problem: Clinical Measurements: ?Goal: Respiratory complications will improve ?Outcome: Progressing ?  ?Problem: Clinical Measurements: ?Goal: Cardiovascular complication will be avoided ?Outcome: Progressing ?  ?Problem: Clinical Measurements: ?Goal: Diagnostic test results will improve ?Outcome: Progressing ?  ?

## 2023-02-01 NOTE — Progress Notes (Signed)
1 Day Post-Op Procedure(s) (LRB): AORTIC VALVE REPLACEMENT (AVR) USING 21 MM INSPIRIS RESILIA AORTIC VALVE (N/A) CORONARY ARTERY BYPASS GRAFTING (CABG)X THREE USING LEFT INTERNAL MAMMARY ARTERY AND RIGHT GREATER SAPHENEOUS VEIN HARVESTED ENDOSCOPICLY (N/A) TRANSESOPHAGEAL ECHOCARDIOGRAM (N/A) Subjective: No complaints.  Objective: Vital signs in last 24 hours: Temp:  [97 F (36.1 C)-99.3 F (37.4 C)] 99.3 F (37.4 C) (11/01 0600) Pulse Rate:  [66-89] 84 (11/01 0600) Resp:  [0-23] 14 (11/01 0600) BP: (91-119)/(53-70) 119/70 (10/31 0720) SpO2:  [68 %-100 %] 94 % (11/01 0600) Arterial Line BP: (81-128)/(48-68) 96/59 (11/01 0600) FiO2 (%):  [40 %-50 %] 40 % (10/31 1730) Weight:  [72.7 kg] 72.7 kg (11/01 0600)  Hemodynamic parameters for last 24 hours: PAP: (13-40)/(0-26) 37/24 CO:  [2.6 L/min-4.2 L/min] 4.2 L/min CI:  [1.52 L/min/m2-2.5 L/min/m2] 2.5 L/min/m2  Intake/Output from previous day: 10/31 0701 - 11/01 0700 In: 5072.7 [I.V.:2529.9; Blood:295; IV Piggyback:2247.8] Out: 4553 [Urine:3340; Blood:571; Chest Tube:642] Intake/Output this shift: Total I/O In: 1521.3 [I.V.:700.3; IV Piggyback:821] Out: 990 [Urine:650; Chest Tube:340]  General appearance: alert and cooperative Neurologic: intact Heart: regular rate and rhythm Lungs: clear to auscultation bilaterally Extremities: edema mild Wound: dressing dry  Lab Results: Recent Labs    01/31/23 2027 02/01/23 0405  WBC 11.6* 12.8*  HGB 10.6* 11.1*  HCT 31.1* 32.5*  PLT 118* 136*   BMET:  Recent Labs    01/31/23 2139 02/01/23 0405  NA 138 137  K 3.6 3.8  CL 109 109  CO2 20* 20*  GLUCOSE 134* 118*  BUN 9 6*  CREATININE 0.75 0.84  CALCIUM 7.7* 7.9*    PT/INR:  Recent Labs    01/31/23 1449  LABPROT 17.9*  INR 1.5*   ABG    Component Value Date/Time   PHART 7.382 01/31/2023 1930   HCO3 24.3 01/31/2023 1930   TCO2 25 01/31/2023 1930   ACIDBASEDEF 1.0 01/31/2023 1930   O2SAT 97 01/31/2023 1930    CBG (last 3)  Recent Labs    02/01/23 0050 02/01/23 0157 02/01/23 0404  GLUCAP 128* 121* 118*   CXR: mild bibasilar atelectasis  ECG: sinus 70's, LBBB (old) Assessment/Plan: S/P Procedure(s) (LRB): AORTIC VALVE REPLACEMENT (AVR) USING 21 MM INSPIRIS RESILIA AORTIC VALVE (N/A) CORONARY ARTERY BYPASS GRAFTING (CABG)X THREE USING LEFT INTERNAL MAMMARY ARTERY AND RIGHT GREATER SAPHENEOUS VEIN HARVESTED ENDOSCOPICLY (N/A) TRANSESOPHAGEAL ECHOCARDIOGRAM (N/A)  POD 1 Hemodynamically stable but still on some neo. Hold Lopressor for now. DC swan, arterial line.  Wt is 9 lbs over preop. Start diuresis.  Glucose under good control.  Transition to Levemir and SSI.  IS, OOB.    LOS: 1 day    Darlene Kelly 02/01/2023

## 2023-02-02 ENCOUNTER — Other Ambulatory Visit: Payer: Self-pay

## 2023-02-02 ENCOUNTER — Inpatient Hospital Stay (HOSPITAL_COMMUNITY): Payer: Medicare Other

## 2023-02-02 LAB — BASIC METABOLIC PANEL
Anion gap: 8 (ref 5–15)
BUN: 13 mg/dL (ref 8–23)
CO2: 24 mmol/L (ref 22–32)
Calcium: 9.1 mg/dL (ref 8.9–10.3)
Chloride: 102 mmol/L (ref 98–111)
Creatinine, Ser: 1.25 mg/dL — ABNORMAL HIGH (ref 0.44–1.00)
GFR, Estimated: 46 mL/min — ABNORMAL LOW (ref >60)
Glucose, Bld: 122 mg/dL — ABNORMAL HIGH (ref 70–99)
Potassium: 3.7 mmol/L (ref 3.5–5.1)
Sodium: 134 mmol/L — ABNORMAL LOW (ref 135–145)

## 2023-02-02 LAB — CBC
HCT: 31.8 % — ABNORMAL LOW (ref 36.0–46.0)
Hemoglobin: 10.4 g/dL — ABNORMAL LOW (ref 12.0–15.0)
MCH: 30.3 pg (ref 26.0–34.0)
MCHC: 32.7 g/dL (ref 30.0–36.0)
MCV: 92.7 fL (ref 80.0–100.0)
Platelets: 106 10*3/uL — ABNORMAL LOW (ref 150–400)
RBC: 3.43 MIL/uL — ABNORMAL LOW (ref 3.87–5.11)
RDW: 15.2 % (ref 11.5–15.5)
WBC: 10.6 10*3/uL — ABNORMAL HIGH (ref 4.0–10.5)
nRBC: 0 % (ref 0.0–0.2)

## 2023-02-02 LAB — GLUCOSE, CAPILLARY
Glucose-Capillary: 124 mg/dL — ABNORMAL HIGH (ref 70–99)
Glucose-Capillary: 196 mg/dL — ABNORMAL HIGH (ref 70–99)
Glucose-Capillary: 195 mg/dL — ABNORMAL HIGH (ref 70–99)
Glucose-Capillary: 116 mg/dL — ABNORMAL HIGH (ref 70–99)
Glucose-Capillary: 169 mg/dL — ABNORMAL HIGH (ref 70–99)

## 2023-02-02 MED ORDER — AMIODARONE LOAD VIA INFUSION
150.0000 mg | Freq: Once | INTRAVENOUS | Status: AC
  Administered 2023-02-02: 150 mg via INTRAVENOUS
  Filled 2023-02-02: qty 83.34

## 2023-02-02 MED ORDER — POTASSIUM CHLORIDE CRYS ER 20 MEQ PO TBCR
20.0000 meq | EXTENDED_RELEASE_TABLET | ORAL | Status: AC
  Administered 2023-02-02 (×3): 20 meq via ORAL
  Filled 2023-02-02 (×3): qty 1

## 2023-02-02 MED ORDER — AMIODARONE HCL IN DEXTROSE 360-4.14 MG/200ML-% IV SOLN
60.0000 mg/h | INTRAVENOUS | Status: AC
  Administered 2023-02-02: 60 mg/h via INTRAVENOUS
  Filled 2023-02-02: qty 200

## 2023-02-02 MED ORDER — FUROSEMIDE 10 MG/ML IJ SOLN
40.0000 mg | Freq: Once | INTRAMUSCULAR | Status: AC
  Administered 2023-02-02: 40 mg via INTRAVENOUS
  Filled 2023-02-02: qty 4

## 2023-02-02 MED ORDER — AMIODARONE HCL IN DEXTROSE 360-4.14 MG/200ML-% IV SOLN: 30.0000 mg/h | INTRAVENOUS | Status: DC

## 2023-02-02 MED ORDER — AMIODARONE HCL IN DEXTROSE 360-4.14 MG/200ML-% IV SOLN
INTRAVENOUS | Status: AC
  Administered 2023-02-02: 60 mg/h via INTRAVENOUS
  Filled 2023-02-02: qty 200

## 2023-02-02 MED ORDER — AMIODARONE HCL 200 MG PO TABS
400.0000 mg | ORAL_TABLET | Freq: Two times a day (BID) | ORAL | Status: DC
  Administered 2023-02-02 – 2023-02-08 (×13): 400 mg via ORAL
  Filled 2023-02-02 (×13): qty 2

## 2023-02-02 MED FILL — Thrombin (Recombinant) For Soln 20000 Unit: CUTANEOUS | Qty: 1 | Status: AC

## 2023-02-02 NOTE — Progress Notes (Signed)
[address]       [place] [address]             [phone]      2 Days Post-Op  Procedure(s) (LRB): AORTIC VALVE REPLACEMENT (AVR) USING 21 MM INSPIRIS RESILIA AORTIC VALVE (N/A) CORONARY ARTERY BYPASS GRAFTING (CABG)X THREE USING LEFT INTERNAL MAMMARY ARTERY AND RIGHT GREATER SAPHENEOUS VEIN HARVESTED ENDOSCOPICLY (N/A) TRANSESOPHAGEAL ECHOCARDIOGRAM (N/A)   Total Length of Stay:  LOS: 2 days    SUBJECTIVE: Went into afib last night, converted on amio drip Feels well  Vitals:   02/02/23 0500 02/02/23 0700  BP: (!) 98/53 99/62  Pulse:  67  Resp: 15 18  Temp:    SpO2:  96%    Intake/Output      11/01 0701 11/02 0700 11/02 0701 11/03 0700   I.V. (mL/kg) 456.4 (6.4)    Blood     IV Piggyback 300.1    Total Intake(mL/kg) 756.5 (10.7)    Urine (mL/kg/hr) 2450 (1.4)    Blood     Chest Tube 300    Total Output 2750    Net -1993.5             amiodarone 60 mg/hr (02/02/23 0600)   Followed by   amiodarone     phenylephrine (NEO-SYNEPHRINE) Adult infusion Stopped (02/01/23 1249)    CBC    Component Value Date/Time   WBC 10.6 (H) 02/02/2023 0444   RBC 3.43 (L) 02/02/2023 0444   HGB 10.4 (L) 02/02/2023 0444   HGB 13.7 01/11/2023 1709   HGB 14.2 01/21/2017 1142   HCT 31.8 (L) 02/02/2023 0444   HCT 40.7 01/11/2023 1709   HCT 41.5 01/21/2017 1142   PLT 106 (L) 02/02/2023 0444   PLT 238 01/11/2023 1709   MCV 92.7 02/02/2023 0444   MCV 91 01/11/2023 1709   MCV 90.0 01/21/2017 1142   MCH 30.3 02/02/2023 0444   MCHC 32.7 02/02/2023 0444   RDW 15.2 02/02/2023 0444   RDW 14.0 01/11/2023 1709   RDW 13.4 01/21/2017 1142   LYMPHSABS 1.3 02/09/2020 0901   LYMPHSABS 1.2 01/21/2017 1142   MONOABS 0.6 02/09/2020 0901   MONOABS 0.6 01/21/2017 1142   EOSABS 0.0 02/09/2020 0901   EOSABS 0.0 01/21/2017 1142   BASOSABS 0.0 02/09/2020 0901   BASOSABS 0.0 01/21/2017 1142   CMP     Component Value Date/Time   NA 134 (L) 02/02/2023 0444    NA 141 01/11/2023 1706   NA 139 01/21/2017 1142   K 3.7 02/02/2023 0444   K 4.9 01/21/2017 1142   CL 102 02/02/2023 0444   CO2 24 02/02/2023 0444   CO2 26 01/21/2017 1142   GLUCOSE 122 (H) 02/02/2023 0444   GLUCOSE 113 01/21/2017 1142   BUN 13 02/02/2023 0444   BUN 16 01/11/2023 1706   BUN 18.7 01/21/2017 1142   CREATININE 1.25 (H) 02/02/2023 0444   CREATININE 0.74 02/09/2020 0901   CREATININE 0.8 01/21/2017 1142   CALCIUM 9.1 02/02/2023 0444   CALCIUM 10.3 01/21/2017 1142   PROT 7.3 01/29/2023 1457   PROT 6.8 07/07/2020 0828   PROT 7.4 01/21/2017 1142   ALBUMIN 4.1 01/29/2023 1457   ALBUMIN 4.5 07/07/2020 0828   ALBUMIN 4.1 01/21/2017 1142   AST 27 01/29/2023 1457   AST 25 02/09/2020 0901   AST 24 01/21/2017 1142   ALT 24 01/29/2023 1457   ALT 21 02/09/2020 0901   ALT 21 01/21/2017 1142  ALKPHOS 68 01/29/2023 1457   ALKPHOS 73 01/21/2017 1142   BILITOT 0.6 01/29/2023 1457   BILITOT 0.3 07/07/2020 0828   BILITOT 0.6 02/09/2020 0901   BILITOT 0.68 01/21/2017 1142   GFRNONAA 46 (L) 02/02/2023 0444   GFRNONAA >60 02/09/2020 0901   GFRAA >60 02/02/2019 0754   ABG    Component Value Date/Time   PHART 7.382 01/31/2023 1930   PCO2ART 40.7 01/31/2023 1930   PO2ART 95 01/31/2023 1930   HCO3 24.3 01/31/2023 1930   TCO2 25 01/31/2023 1930   ACIDBASEDEF 1.0 01/31/2023 1930   O2SAT 97 01/31/2023 1930   CBG (last 3)  Recent Labs    02/01/23 1955 02/01/23 2353 02/02/23 0347  GLUCAP 146* 148* 124*  EXAM Lungs: clear Card:RR BJY:NWGN Neuro:intact   ASSESSMENT: POD#2 AVR CABG Hemodynamics ok. Will transition to po amiodarone and wean drip off, if maintains NSR will send up stairs later today Remove pacing wires Diuresis today    Eugenio Hoes, MD 02/02/2023

## 2023-02-03 ENCOUNTER — Other Ambulatory Visit: Payer: Self-pay

## 2023-02-03 LAB — GLUCOSE, CAPILLARY
Glucose-Capillary: 118 mg/dL — ABNORMAL HIGH (ref 70–99)
Glucose-Capillary: 121 mg/dL — ABNORMAL HIGH (ref 70–99)
Glucose-Capillary: 130 mg/dL — ABNORMAL HIGH (ref 70–99)
Glucose-Capillary: 158 mg/dL — ABNORMAL HIGH (ref 70–99)
Glucose-Capillary: 97 mg/dL (ref 70–99)

## 2023-02-03 MED ORDER — AMIODARONE IV BOLUS ONLY 150 MG/100ML: 150.0000 mg | Freq: Once | INTRAVENOUS | Status: AC

## 2023-02-03 MED ORDER — FUROSEMIDE 40 MG PO TABS
40.0000 mg | ORAL_TABLET | Freq: Every day | ORAL | Status: DC
  Administered 2023-02-03: 40 mg via ORAL
  Filled 2023-02-03: qty 1

## 2023-02-03 MED ORDER — METFORMIN HCL ER 500 MG PO TB24
1000.0000 mg | ORAL_TABLET | Freq: Every day | ORAL | Status: DC
  Administered 2023-02-03: 1000 mg via ORAL
  Filled 2023-02-03: qty 2

## 2023-02-03 MED ORDER — AMIODARONE IV BOLUS ONLY 150 MG/100ML
INTRAVENOUS | Status: AC
  Administered 2023-02-03: 150 mg
  Filled 2023-02-03: qty 100

## 2023-02-03 MED ORDER — METOPROLOL TARTRATE 12.5 MG HALF TABLET
12.5000 mg | ORAL_TABLET | Freq: Two times a day (BID) | ORAL | Status: DC
  Administered 2023-02-03 – 2023-02-08 (×5): 12.5 mg via ORAL
  Filled 2023-02-03 (×10): qty 1

## 2023-02-03 MED ORDER — INSULIN ASPART 100 UNIT/ML IJ SOLN
0.0000 [IU] | Freq: Three times a day (TID) | INTRAMUSCULAR | Status: DC
  Administered 2023-02-03 (×3): 2 [IU] via SUBCUTANEOUS

## 2023-02-03 MED ORDER — INSULIN ASPART 100 UNIT/ML IJ SOLN: 0.0000 [IU] | INTRAMUSCULAR | Status: DC

## 2023-02-03 MED ORDER — POTASSIUM CHLORIDE CRYS ER 20 MEQ PO TBCR
20.0000 meq | EXTENDED_RELEASE_TABLET | Freq: Every day | ORAL | Status: DC
  Administered 2023-02-03: 20 meq via ORAL
  Filled 2023-02-03: qty 1

## 2023-02-03 NOTE — Progress Notes (Signed)
Pt HR 120's AFIB RVR per 12 LED. PA called . New order for IV bolus on MAR.   Everlean Cherry, RN

## 2023-02-03 NOTE — Progress Notes (Signed)
3 Days Post-Op Procedure(s) (LRB): AORTIC VALVE REPLACEMENT (AVR) USING 21 MM INSPIRIS RESILIA AORTIC VALVE (N/A) CORONARY ARTERY BYPASS GRAFTING (CABG)X THREE USING LEFT INTERNAL MAMMARY ARTERY AND RIGHT GREATER SAPHENEOUS VEIN HARVESTED ENDOSCOPICLY (N/A) TRANSESOPHAGEAL ECHOCARDIOGRAM (N/A) Subjective: Feels pretty well  Objective: Vital signs in last 24 hours: Temp:  [98.4 F (36.9 C)-98.8 F (37.1 C)] 98.8 F (37.1 C) (11/03 0530) Pulse Rate:  [65-80] 80 (11/03 0530) Cardiac Rhythm: Normal sinus rhythm (11/02 1935) Resp:  [19-43] 20 (11/03 0530) BP: (85-118)/(47-84) 109/65 (11/03 0530) SpO2:  [93 %-100 %] 100 % (11/03 0530) Weight:  [70.2 kg] 70.2 kg (11/03 0500)  Hemodynamic parameters for last 24 hours:    Intake/Output from previous day: 11/02 0701 - 11/03 0700 In: 645.7 [P.O.:480; I.V.:165.7] Out: 2075 [Urine:2075] Intake/Output this shift: No intake/output data recorded.  General appearance: alert, cooperative, and no distress Heart: regular rate and rhythm Lungs: dim in bases Abdomen: benign Extremities: no edema Wound: dressings CDI  Lab Results: Recent Labs    02/01/23 1650 02/02/23 0444  WBC 12.9* 10.6*  HGB 11.3* 10.4*  HCT 33.8* 31.8*  PLT 115* 106*   BMET:  Recent Labs    02/01/23 1650 02/02/23 0444  NA 139 134*  K 3.4* 3.7  CL 105 102  CO2 24 24  GLUCOSE 142* 122*  BUN 8 13  CREATININE 1.00 1.25*  CALCIUM 8.9 9.1    PT/INR:  Recent Labs    01/31/23 1449  LABPROT 17.9*  INR 1.5*   ABG    Component Value Date/Time   PHART 7.382 01/31/2023 1930   HCO3 24.3 01/31/2023 1930   TCO2 25 01/31/2023 1930   ACIDBASEDEF 1.0 01/31/2023 1930   O2SAT 97 01/31/2023 1930   CBG (last 3)  Recent Labs    02/02/23 1954 02/02/23 2359 02/03/23 0401  GLUCAP 169* 97 118*    Meds Scheduled Meds:  acetaminophen  1,000 mg Oral Q6H   Or   acetaminophen (TYLENOL) oral liquid 160 mg/5 mL  1,000 mg Per Tube Q6H   amiodarone  400 mg Oral BID    aspirin EC  325 mg Oral Daily   Or   aspirin  324 mg Per Tube Daily   bisacodyl  10 mg Oral Daily   Or   bisacodyl  10 mg Rectal Daily   Chlorhexidine Gluconate Cloth  6 each Topical Daily   docusate sodium  200 mg Oral Daily   enoxaparin (LOVENOX) injection  40 mg Subcutaneous QHS   gabapentin  300 mg Oral QPM   insulin aspart  0-24 Units Subcutaneous Q4H   insulin detemir  15 Units Subcutaneous Daily   melatonin  10 mg Oral QHS   pantoprazole  40 mg Oral Daily   rosuvastatin  20 mg Oral BID   sodium chloride flush  3 mL Intravenous Q12H   Continuous Infusions:  phenylephrine (NEO-SYNEPHRINE) Adult infusion Stopped (02/01/23 1249)   PRN Meds:.dextrose, metoprolol tartrate, morphine injection, ondansetron (ZOFRAN) IV, mouth rinse, oxyCODONE, sodium chloride flush, traMADol  Xrays DG Chest Port 1 View  Result Date: 02/02/2023 CLINICAL DATA:  History of CABG EXAM: PORTABLE CHEST 1 VIEW COMPARISON:  Chest x-ray dated February 01, 2023 FINDINGS: Interval removal of PA catheter, bilateral chest tubes, and mediastinal drains. Right IJ line sheath remaining in place. Cardiac and mediastinal contours are unchanged post median sternotomy. Prior aortic valve replacement. Large hiatal hernia. Small bilateral pleural effusions and atelectasis, similar to prior exam. No evidence of pneumothorax. IMPRESSION: 1. Interval removal  of PA catheter, bilateral chest tubes, and mediastinal drains. 2. No evidence of pneumothorax. Electronically Signed   By: Allegra Lai M.D.   On: 02/02/2023 09:46    Assessment/Plan: S/P Procedure(s) (LRB): AORTIC VALVE REPLACEMENT (AVR) USING 21 MM INSPIRIS RESILIA AORTIC VALVE (N/A) CORONARY ARTERY BYPASS GRAFTING (CABG)X THREE USING LEFT INTERNAL MAMMARY ARTERY AND RIGHT GREATER SAPHENEOUS VEIN HARVESTED ENDOSCOPICLY (N/A) TRANSESOPHAGEAL ECHOCARDIOGRAM (N/A) POD#3 AVR/cabg  1 afeb, s BP 80's-110's, sinus rhythm, on amio/metop 2 O2 sats good on 3 liters [place] 3  good UOP, weight stable.Marland Kitchen about 2 KG>preop, will diurese a little further, d/c foley 4 BS adeq controlled, will stop insulin except SSI and resume glucophage, preop HgA1C 6.1 5 recheck labs in am 6 routine pulm hygiene and rehab    LOS: 3 days    Rowe Clack PA-C Pager [phone] 02/03/2023

## 2023-02-03 NOTE — Plan of Care (Signed)
POC progressing.  

## 2023-02-04 LAB — GLUCOSE, CAPILLARY
Glucose-Capillary: 107 mg/dL — ABNORMAL HIGH (ref 70–99)
Glucose-Capillary: 119 mg/dL — ABNORMAL HIGH (ref 70–99)
Glucose-Capillary: 169 mg/dL — ABNORMAL HIGH (ref 70–99)
Glucose-Capillary: 178 mg/dL — ABNORMAL HIGH (ref 70–99)

## 2023-02-04 LAB — BASIC METABOLIC PANEL
Anion gap: 12 (ref 5–15)
BUN: 36 mg/dL — ABNORMAL HIGH (ref 8–23)
CO2: 20 mmol/L — ABNORMAL LOW (ref 22–32)
Calcium: 9.3 mg/dL (ref 8.9–10.3)
Chloride: 103 mmol/L (ref 98–111)
Creatinine, Ser: 2.72 mg/dL — ABNORMAL HIGH (ref 0.44–1.00)
GFR, Estimated: 18 mL/min — ABNORMAL LOW (ref >60)
Glucose, Bld: 115 mg/dL — ABNORMAL HIGH (ref 70–99)
Potassium: 4.3 mmol/L (ref 3.5–5.1)
Sodium: 135 mmol/L (ref 135–145)

## 2023-02-04 LAB — CBC
HCT: 29.6 % — ABNORMAL LOW (ref 36.0–46.0)
Hemoglobin: 9.9 g/dL — ABNORMAL LOW (ref 12.0–15.0)
MCH: 30.8 pg (ref 26.0–34.0)
MCHC: 33.4 g/dL (ref 30.0–36.0)
MCV: 92.2 fL (ref 80.0–100.0)
Platelets: 151 10*3/uL (ref 150–400)
RBC: 3.21 MIL/uL — ABNORMAL LOW (ref 3.87–5.11)
RDW: 14.6 % (ref 11.5–15.5)
WBC: 10.3 10*3/uL (ref 4.0–10.5)
nRBC: 0 % (ref 0.0–0.2)

## 2023-02-04 MED ORDER — POLYETHYLENE GLYCOL 3350 17 G PO PACK
17.0000 g | PACK | Freq: Every day | ORAL | Status: DC
  Administered 2023-02-04 – 2023-02-08 (×3): 17 g via ORAL
  Filled 2023-02-04 (×5): qty 1

## 2023-02-04 MED ORDER — INSULIN ASPART 100 UNIT/ML IJ SOLN
0.0000 [IU] | Freq: Three times a day (TID) | INTRAMUSCULAR | Status: DC
  Administered 2023-02-05 (×2): 2 [IU] via SUBCUTANEOUS
  Administered 2023-02-05: 3 [IU] via SUBCUTANEOUS
  Administered 2023-02-06: 2 [IU] via SUBCUTANEOUS
  Administered 2023-02-06 – 2023-02-07 (×3): 3 [IU] via SUBCUTANEOUS
  Administered 2023-02-07: 5 [IU] via SUBCUTANEOUS
  Administered 2023-02-07 – 2023-02-08 (×2): 3 [IU] via SUBCUTANEOUS

## 2023-02-04 NOTE — Progress Notes (Signed)
D/c pacing wires as per order. Pacing wire ends were intact. Patient tolerated well.  Burna Sis, RN

## 2023-02-04 NOTE — Plan of Care (Signed)
  Problem: Education: Goal: Knowledge of General Education information will improve Description: Including pain rating scale, medication(s)/side effects and non-pharmacologic comfort measures Outcome: Progressing   Problem: Health Behavior/Discharge Planning: Goal: Ability to manage health-related needs will improve Outcome: Progressing   Problem: Activity: Goal: Risk for activity intolerance will decrease Outcome: Progressing   Problem: Nutrition: Goal: Adequate nutrition will be maintained Outcome: Progressing   Problem: Pain Management: Goal: General experience of comfort will improve Outcome: Progressing

## 2023-02-04 NOTE — Progress Notes (Addendum)
Mobility Specialist Progress Note:   02/04/23 1238  Mobility  Activity Ambulated with assistance in hallway  Level of Assistance Standby assist, set-up cues, supervision of patient - no hands on  Assistive Device Front wheel walker  Distance Ambulated (ft) 400 ft  Activity Response Tolerated well  Mobility Referral Yes  $Mobility charge 1 Mobility  Mobility Specialist Start Time (ACUTE ONLY) 1320  Mobility Specialist Stop Time (ACUTE ONLY) 1333  Mobility Specialist Time Calculation (min) (ACUTE ONLY) 13 min   Pre Mobility: 80 HR , 113/61 BP , 96% SpO2 RA During Mobility: 80-85% SpO2 RA Post Mobility: 83 HR , 96%SpO2  Pt received in bed, agreeable to mobility. MinA bed mobility. SB to stand and ambulate. Pt c/o slight SOB during ambulation. SpO2 in mid 80's with a peak of 85%. Pt able to ambulate back to room w/o fault. When returning to supine position pt c/o slight dizziness that she stated quickly resolved. Pt left in bed with call bell in reach and all needs met. Family present.  Leory Plowman  Mobility Specialist Please contact via [employer] office at [phone]

## 2023-02-04 NOTE — Care Management Important Message (Signed)
Important Message  Patient Details  Name: Darlene Kelly MRN: [id number] Date of Birth: 26-Apr-1950   Important Message Given:  Yes - Medicare IM     Sherilyn Banker 02/04/2023, 1:31 PM

## 2023-02-04 NOTE — Progress Notes (Addendum)
4 Days Post-Op Procedure(s) (LRB): AORTIC VALVE REPLACEMENT (AVR) USING 21 MM INSPIRIS RESILIA AORTIC VALVE (N/A) CORONARY ARTERY BYPASS GRAFTING (CABG)X THREE USING LEFT INTERNAL MAMMARY ARTERY AND RIGHT GREATER SAPHENEOUS VEIN HARVESTED ENDOSCOPICLY (N/A) TRANSESOPHAGEAL ECHOCARDIOGRAM (N/A) Subjective:  No complaints. Ambulated this am.   Objective: Vital signs in last 24 hours: Temp:  [98.2 F (36.8 C)-98.7 F (37.1 C)] 98.6 F (37 C) (11/04 0321) Pulse Rate:  [66-89] 85 (11/04 0614) Cardiac Rhythm: Atrial fibrillation;Bundle branch block (11/03 1906) Resp:  [18-24] 24 (11/04 0614) BP: (80-106)/(40-77) 98/57 (11/04 0321) SpO2:  [91 %-96 %] 93 % (11/04 0614) Weight:  [69.7 kg] 69.7 kg (11/04 0614)  Hemodynamic parameters for last 24 hours:    Intake/Output from previous day: 11/03 0701 - 11/04 0700 In: 240 [P.O.:240] Out: 2100 [Urine:2100] Intake/Output this shift: No intake/output data recorded.  General appearance: alert and cooperative Neurologic: intact Heart: regular rate and rhythm Lungs: clear to auscultation bilaterally Abdomen: soft, non-tender; bowel sounds normal Extremities: no edema Wound: incision ok. Still has pacing wires in.  Lab Results: Recent Labs    02/02/23 0444 02/04/23 0331  WBC 10.6* 10.3  HGB 10.4* 9.9*  HCT 31.8* 29.6*  PLT 106* 151   BMET:  Recent Labs    02/02/23 0444 02/04/23 0626  NA 134* 135  K 3.7 4.3  CL 102 103  CO2 24 20*  GLUCOSE 122* 115*  BUN 13 36*  CREATININE 1.25* 2.72*  CALCIUM 9.1 9.3    PT/INR: No results for input(s): "LABPROT", "INR" in the last 72 hours. ABG    Component Value Date/Time   PHART 7.382 01/31/2023 1930   HCO3 24.3 01/31/2023 1930   TCO2 25 01/31/2023 1930   ACIDBASEDEF 1.0 01/31/2023 1930   O2SAT 97 01/31/2023 1930   CBG (last 3)  Recent Labs    02/03/23 1610 02/03/23 2219 02/04/23 0617  GLUCAP 121* 158* 107*    Assessment/Plan: S/P Procedure(s) (LRB): AORTIC VALVE  REPLACEMENT (AVR) USING 21 MM INSPIRIS RESILIA AORTIC VALVE (N/A) CORONARY ARTERY BYPASS GRAFTING (CABG)X THREE USING LEFT INTERNAL MAMMARY ARTERY AND RIGHT GREATER SAPHENEOUS VEIN HARVESTED ENDOSCOPICLY (N/A) TRANSESOPHAGEAL ECHOCARDIOGRAM (N/A)  POD 4 Hemodynamically stable but BP low normal. Continue low dose Lopressor.  Postop atrial fib: she had some last pm and received a bolus of amio. Back in NSR last pm and has remained in sinus overnight on po amio.   Creat jumped form 1.25 two days ago to 2.72 this am. Suspect this is from diuresis. Wt is 3 lbs over preop. I think she will come back down on her own now. DC lasix and KCl and repeat in am.  DC pacing wires today.  Glucose under control but with elevated creat will hold Metformin for now and continue SSI.   Continue IS, ambulation.  Possibly home tomorrow if no further atrial fib and creat comes back down.  LOS: 4 days    Alleen Borne 02/04/2023

## 2023-02-04 NOTE — Progress Notes (Signed)
CARDIAC REHAB PHASE I     Post [facility] education including site care, restrictions, heart healthy diabetic diet, sternal precautions, IS use at home, home needs at discharge, exercise guidelines and CRP2 reviewed. All questions and concerns addressed. Will refer to [facility] for CRP2. Will continue to follow.  [id number] Woodroe Chen, RN BSN 02/04/2023 11:04 AM

## 2023-02-04 NOTE — Plan of Care (Signed)
POC progressing.  

## 2023-02-05 LAB — BASIC METABOLIC PANEL
Anion gap: 8 (ref 5–15)
BUN: 38 mg/dL — ABNORMAL HIGH (ref 8–23)
CO2: 23 mmol/L (ref 22–32)
Calcium: 9.1 mg/dL (ref 8.9–10.3)
Chloride: 102 mmol/L (ref 98–111)
Creatinine, Ser: 3.18 mg/dL — ABNORMAL HIGH (ref 0.44–1.00)
GFR, Estimated: 15 mL/min — ABNORMAL LOW (ref >60)
Glucose, Bld: 134 mg/dL — ABNORMAL HIGH (ref 70–99)
Potassium: 3.7 mmol/L (ref 3.5–5.1)
Sodium: 133 mmol/L — ABNORMAL LOW (ref 135–145)

## 2023-02-05 LAB — GLUCOSE, CAPILLARY
Glucose-Capillary: 141 mg/dL — ABNORMAL HIGH (ref 70–99)
Glucose-Capillary: 122 mg/dL — ABNORMAL HIGH (ref 70–99)
Glucose-Capillary: 152 mg/dL — ABNORMAL HIGH (ref 70–99)
Glucose-Capillary: 177 mg/dL — ABNORMAL HIGH (ref 70–99)

## 2023-02-05 MED ORDER — ASPIRIN 81 MG PO TBEC
81.0000 mg | DELAYED_RELEASE_TABLET | Freq: Every day | ORAL | Status: DC
  Administered 2023-02-05 – 2023-02-08 (×4): 81 mg via ORAL
  Filled 2023-02-05 (×4): qty 1

## 2023-02-05 MED ORDER — AMIODARONE IV BOLUS ONLY 150 MG/100ML: 150.0000 mg | Freq: Once | INTRAVENOUS | Status: AC

## 2023-02-05 MED ORDER — WARFARIN SODIUM 2.5 MG PO TABS
2.5000 mg | ORAL_TABLET | Freq: Every day | ORAL | Status: DC
  Administered 2023-02-05 – 2023-02-07 (×3): 2.5 mg via ORAL
  Filled 2023-02-05 (×3): qty 1

## 2023-02-05 MED ORDER — POTASSIUM CHLORIDE CRYS ER 20 MEQ PO TBCR
20.0000 meq | EXTENDED_RELEASE_TABLET | Freq: Once | ORAL | Status: AC
  Administered 2023-02-05: 20 meq via ORAL
  Filled 2023-02-05: qty 1

## 2023-02-05 MED ORDER — AMIODARONE IV BOLUS ONLY 150 MG/100ML
150.0000 mg | Freq: Once | INTRAVENOUS | Status: AC
  Administered 2023-02-05: 150 mg via INTRAVENOUS
  Filled 2023-02-05: qty 100

## 2023-02-05 MED ORDER — AMIODARONE IV BOLUS ONLY 150 MG/100ML
INTRAVENOUS | Status: AC
  Administered 2023-02-05: 150 mg via INTRAVENOUS
  Filled 2023-02-05: qty 100

## 2023-02-05 MED ORDER — WARFARIN - PHYSICIAN DOSING INPATIENT
Freq: Every day | Status: DC
Start: 2023-02-05 — End: 2023-02-08

## 2023-02-05 NOTE — Progress Notes (Signed)
5 Days Post-Op Procedure(s) (LRB): AORTIC VALVE REPLACEMENT (AVR) USING 21 MM INSPIRIS RESILIA AORTIC VALVE (N/A) CORONARY ARTERY BYPASS GRAFTING (CABG)X THREE USING LEFT INTERNAL MAMMARY ARTERY AND RIGHT GREATER SAPHENEOUS VEIN HARVESTED ENDOSCOPICLY (N/A) TRANSESOPHAGEAL ECHOCARDIOGRAM (N/A) Subjective: No complaints  Went back into atrial fib early this am.  Objective: Vital signs in last 24 hours: Temp:  [98 F (36.7 C)-98.7 F (37.1 C)] 98 F (36.7 C) (11/05 0500) Pulse Rate:  [74-100] 100 (11/05 0500) Cardiac Rhythm: Atrial fibrillation (11/05 0409) Resp:  [12-30] 18 (11/05 0500) BP: (82-111)/(52-68) 100/68 (11/05 0500) SpO2:  [90 %-96 %] 92 % (11/05 0500) Weight:  [69.8 kg-70.5 kg] 69.8 kg (11/05 0500)  Hemodynamic parameters for last 24 hours:    Intake/Output from previous day: 11/04 0701 - 11/05 0700 In: -  Out: 1100 [Urine:1100] Intake/Output this shift: Total I/O In: -  Out: 1100 [Urine:1100]  General appearance: alert and cooperative Neurologic: intact Heart: irregularly irregular rhythm Lungs: clear to auscultation bilaterally Extremities: no edema Wound: incision healing well  Lab Results: Recent Labs    02/04/23 0331  WBC 10.3  HGB 9.9*  HCT 29.6*  PLT 151   BMET:  Recent Labs    02/04/23 0626 02/05/23 0413  NA 135 133*  K 4.3 3.7  CL 103 102  CO2 20* 23  GLUCOSE 115* 134*  BUN 36* 38*  CREATININE 2.72* 3.18*  CALCIUM 9.3 9.1    PT/INR: No results for input(s): "LABPROT", "INR" in the last 72 hours. ABG    Component Value Date/Time   PHART 7.382 01/31/2023 1930   HCO3 24.3 01/31/2023 1930   TCO2 25 01/31/2023 1930   ACIDBASEDEF 1.0 01/31/2023 1930   O2SAT 97 01/31/2023 1930   CBG (last 3)  Recent Labs    02/04/23 1729 02/04/23 2055 02/05/23 0532  GLUCAP 169* 178* 141*    Assessment/Plan: S/P Procedure(s) (LRB): AORTIC VALVE REPLACEMENT (AVR) USING 21 MM INSPIRIS RESILIA AORTIC VALVE (N/A) CORONARY ARTERY BYPASS  GRAFTING (CABG)X THREE USING LEFT INTERNAL MAMMARY ARTERY AND RIGHT GREATER SAPHENEOUS VEIN HARVESTED ENDOSCOPICLY (N/A) TRANSESOPHAGEAL ECHOCARDIOGRAM (N/A)  Hemodynamics have been stable but BP still low normal with MAP 76. Continue low dose Lopressor with recurrent AF.  Postop atrial fib that has been recurrent despite amio. Will give her another bolus this am. Continue po 400 bid. K+ 3.7. will give 20 meq. 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  AKI: unclear what that is from but I suspect she must have had some hypotension in addition to diuresis. Creat up to 3.18 this am but making good urine. Hopefully this will turn around in the next day or so.  Wt is about back to baseline.  Continue IS, ambulation.   LOS: 5 days    Alleen Borne 02/05/2023

## 2023-02-05 NOTE — Progress Notes (Signed)
Mobility Specialist Progress Note:   02/05/23 1457  Mobility  Activity Ambulated with assistance in hallway  Level of Assistance Standby assist, set-up cues, supervision of patient - no hands on  Assistive Device Front wheel walker  Distance Ambulated (ft) 425 ft  Activity Response Tolerated well  Mobility Referral Yes  $Mobility charge 1 Mobility  Mobility Specialist Start Time (ACUTE ONLY) 1404  Mobility Specialist Stop Time (ACUTE ONLY) 1416  Mobility Specialist Time Calculation (min) (ACUTE ONLY) 12 min   Pre Mobility: 80 HR , 97% SpO2 RA  During Mobility: 91 HR , 88%-98 SpO2 RA Post Mobility: 78 HR , 94% SpO2 RA   Pt received in bed, agreeable to mobility. Denied any discomfort during ambulation, asx throughout. Pt returned to bed with call bell in reach and all needs met.  Leory Plowman  Mobility Specialist Please contact via [employer] office at [phone]

## 2023-02-05 NOTE — Progress Notes (Signed)
CARDIAC REHAB PHASE I   PRE:  Rate/Rhythm: 78 SR   BP:  Sitting: 101/97      SaO2: 99 Ra   MODE:  Ambulation: 470 ft   POST:  Rate/Rhythm: 95 SR  BP:  Sitting: 112/64      SaO2: 98 RA    Pt ambulated in hallway, using front wheel walker. Mobilizing well with min assist, walking at slow steady pace. Returned to chair with call bell and bedside table in reach. Will continue to follow.  Woodroe Chen, RN BSN 02/05/2023 10:59 AM

## 2023-02-06 DIAGNOSIS — Z952 Presence of prosthetic heart valve: Secondary | ICD-10-CM

## 2023-02-06 DIAGNOSIS — I4891 Unspecified atrial fibrillation: Secondary | ICD-10-CM

## 2023-02-06 DIAGNOSIS — N179 Acute kidney failure, unspecified: Secondary | ICD-10-CM

## 2023-02-06 LAB — BASIC METABOLIC PANEL
Anion gap: 11 (ref 5–15)
BUN: 34 mg/dL — ABNORMAL HIGH (ref 8–23)
CO2: 23 mmol/L (ref 22–32)
Calcium: 9.4 mg/dL (ref 8.9–10.3)
Chloride: 102 mmol/L (ref 98–111)
Creatinine, Ser: 2.95 mg/dL — ABNORMAL HIGH (ref 0.44–1.00)
GFR, Estimated: 16 mL/min — ABNORMAL LOW (ref >60)
Glucose, Bld: 147 mg/dL — ABNORMAL HIGH (ref 70–99)
Potassium: 3.1 mmol/L — ABNORMAL LOW (ref 3.5–5.1)
Sodium: 136 mmol/L (ref 135–145)

## 2023-02-06 LAB — MAGNESIUM: Magnesium: 2.1 mg/dL (ref 1.7–2.4)

## 2023-02-06 LAB — GLUCOSE, CAPILLARY
Glucose-Capillary: 134 mg/dL — ABNORMAL HIGH (ref 70–99)
Glucose-Capillary: 167 mg/dL — ABNORMAL HIGH (ref 70–99)
Glucose-Capillary: 183 mg/dL — ABNORMAL HIGH (ref 70–99)
Glucose-Capillary: 154 mg/dL — ABNORMAL HIGH (ref 70–99)

## 2023-02-06 LAB — PROTIME-INR
INR: 1.1 (ref 0.8–1.2)
Prothrombin Time: 14.4 s (ref 11.4–15.2)

## 2023-02-06 MED ORDER — POTASSIUM CHLORIDE CRYS ER 20 MEQ PO TBCR
40.0000 meq | EXTENDED_RELEASE_TABLET | Freq: Two times a day (BID) | ORAL | Status: DC
  Administered 2023-02-06 (×2): 40 meq via ORAL
  Filled 2023-02-06 (×2): qty 2

## 2023-02-06 MED ORDER — ROSUVASTATIN CALCIUM 5 MG PO TABS
10.0000 mg | ORAL_TABLET | Freq: Every day | ORAL | Status: DC
  Administered 2023-02-07 – 2023-02-08 (×2): 10 mg via ORAL
  Filled 2023-02-06 (×2): qty 2

## 2023-02-06 MED ORDER — ACETAMINOPHEN 325 MG PO TABS
650.0000 mg | ORAL_TABLET | Freq: Four times a day (QID) | ORAL | Status: DC | PRN
  Administered 2023-02-07 – 2023-02-08 (×5): 650 mg via ORAL
  Filled 2023-02-06 (×5): qty 2

## 2023-02-06 MED FILL — Potassium Chloride Inj 2 mEq/ML: INTRAVENOUS | Qty: 40 | Status: AC

## 2023-02-06 MED FILL — Heparin Sodium (Porcine) Inj 1000 Unit/ML: Qty: 1000 | Status: AC

## 2023-02-06 MED FILL — Lidocaine HCl Local Preservative Free (PF) Inj 2%: INTRAMUSCULAR | Qty: 14 | Status: AC

## 2023-02-06 NOTE — Progress Notes (Signed)
CARDIAC REHAB PHASE I   PRE:  Rate/Rhythm: 77 SR    BP: sitting 101/50    SpO2: 93 RA  MODE:  Ambulation: 430 ft   POST:  Rate/Rhythm: 88 SR    BP: sitting 135/65     SpO2: 93 RA  Pt got out of bed with min assist at back. Ambulated with contact guard, no RW needed. No c/o, no afib. Return to bed in upright position. Practiced getting in bed x2 and OOB x1 in preparation for home, family present. Pt can walk with her family, doing well.  [phone]   Ethelda Chick BS, ACSM-CEP 02/06/2023 10:59 AM

## 2023-02-06 NOTE — Progress Notes (Addendum)
      [address]       [employer] [address]             [phone]      6 Days Post-Op Procedure(s) (LRB): AORTIC VALVE REPLACEMENT (AVR) USING 21 MM INSPIRIS RESILIA AORTIC VALVE (N/A) CORONARY ARTERY BYPASS GRAFTING (CABG)X THREE USING LEFT INTERNAL MAMMARY ARTERY AND RIGHT GREATER SAPHENEOUS VEIN HARVESTED ENDOSCOPICLY (N/A) TRANSESOPHAGEAL ECHOCARDIOGRAM (N/A)  Subjective:  Patient sitting up in bed without complaints.  She overall is feeling pretty good.  She looks great.  +ambulation  Objective: Vital signs in last 24 hours: Temp:  [98.1 F (36.7 C)-98.6 F (37 C)] 98.5 F (36.9 C) (11/06 0731) Pulse Rate:  [80-113] 113 (11/06 0731) Cardiac Rhythm: Atrial fibrillation;Bundle branch block (11/06 0700) Resp:  [19-23] 23 (11/06 0731) BP: (102-112)/(62-67) 110/64 (11/06 0731) SpO2:  [90 %-92 %] 92 % (11/06 0731) Weight:  [68.9 kg] 68.9 kg (11/06 0351)  Intake/Output from previous day: 11/05 0701 - 11/06 0700 In: 6 [I.V.:6] Out: 1500 [Urine:1500]  General appearance: alert, cooperative, and no distress Heart: regular rate and rhythm Lungs: clear to auscultation bilaterally Abdomen: soft, non-tender; bowel sounds normal; no masses,  no organomegaly Extremities: edema none present Wound: clean and dry, ecchymosis RLE  Lab Results: Recent Labs    02/04/23 0331  WBC 10.3  HGB 9.9*  HCT 29.6*  PLT 151   BMET:  Recent Labs    02/05/23 0413 02/06/23 0329  NA 133* 136  K 3.7 3.1*  CL 102 102  CO2 23 23  GLUCOSE 134* 147*  BUN 38* 34*  CREATININE 3.18* 2.95*  CALCIUM 9.1 9.4    PT/INR:  Recent Labs    02/06/23 0329  LABPROT 14.4  INR 1.1   ABG    Component Value Date/Time   PHART 7.382 01/31/2023 1930   HCO3 24.3 01/31/2023 1930   TCO2 25 01/31/2023 1930   ACIDBASEDEF 1.0 01/31/2023 1930   O2SAT 97 01/31/2023 1930   CBG (last 3)  Recent Labs    02/05/23 1619 02/05/23 2044 02/06/23 0553  GLUCAP 152* 177* 134*     Assessment/Plan: S/P Procedure(s) (LRB): AORTIC VALVE REPLACEMENT (AVR) USING 21 MM INSPIRIS RESILIA AORTIC VALVE (N/A) CORONARY ARTERY BYPASS GRAFTING (CABG)X THREE USING LEFT INTERNAL MAMMARY ARTERY AND RIGHT GREATER SAPHENEOUS VEIN HARVESTED ENDOSCOPICLY (N/A) TRANSESOPHAGEAL ECHOCARDIOGRAM (N/A)  CV- PAF, required re-bolus of Amiodarone yesterday.. currently in NSR with PVCs- continue Amiodarone 400 mg BID, she had been refusing lopressor due to history of low blood pressure with use... She is willing to take today and see if she can tolerated, I explained this will help with HR control INR 1.0, continue coumadin at 2.5 mg daily.Marland Kitchen goal INR is 2 Pulm- off oxygen, no acute issues, continue IS Renal-AKI- improving, creatinine down to 2.95 Hypokalemia-suspect to diuresis, this was problem prior to admission, will resume home regimen of potassium Dispo- patient stable, looks great, will continue coumadin, trial Lopressor today, continue Amiodarone   LOS: 6 days    Lowella Dandy, PA-C 02/06/2023  CT Surgery  Patient examined, last  postop CXR image reviewed, agree with above assessment and plan Titrating meds for postop afib. Following decline in creatinine - peaked at 3.1 yesterday >>  2.9 today. Walking well and plans on DC to home where she has good support.  Ples Specter MD

## 2023-02-07 LAB — GLUCOSE, CAPILLARY
Glucose-Capillary: 157 mg/dL — ABNORMAL HIGH (ref 70–99)
Glucose-Capillary: 172 mg/dL — ABNORMAL HIGH (ref 70–99)
Glucose-Capillary: 222 mg/dL — ABNORMAL HIGH (ref 70–99)
Glucose-Capillary: 133 mg/dL — ABNORMAL HIGH (ref 70–99)

## 2023-02-07 LAB — BASIC METABOLIC PANEL
Anion gap: 12 (ref 5–15)
BUN: 33 mg/dL — ABNORMAL HIGH (ref 8–23)
CO2: 21 mmol/L — ABNORMAL LOW (ref 22–32)
Calcium: 9.7 mg/dL (ref 8.9–10.3)
Chloride: 103 mmol/L (ref 98–111)
Creatinine, Ser: 2.7 mg/dL — ABNORMAL HIGH (ref 0.44–1.00)
GFR, Estimated: 18 mL/min — ABNORMAL LOW (ref >60)
Glucose, Bld: 163 mg/dL — ABNORMAL HIGH (ref 70–99)
Potassium: 4.2 mmol/L (ref 3.5–5.1)
Sodium: 136 mmol/L (ref 135–145)

## 2023-02-07 LAB — PROTIME-INR
INR: 1.4 — ABNORMAL HIGH (ref 0.8–1.2)
Prothrombin Time: 17.8 s — ABNORMAL HIGH (ref 11.4–15.2)

## 2023-02-07 MED ORDER — POTASSIUM CHLORIDE CRYS ER 20 MEQ PO TBCR
40.0000 meq | EXTENDED_RELEASE_TABLET | Freq: Every day | ORAL | Status: DC
  Administered 2023-02-07 – 2023-02-08 (×2): 40 meq via ORAL
  Filled 2023-02-07 (×2): qty 2

## 2023-02-07 NOTE — Progress Notes (Addendum)
      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  Doing well, she tolerated lopressor yesterday.  She was able to get up and ambulate 3 times without dizziness.  Objective: Vital signs in last 24 hours: Temp:  [97.8 F (36.6 C)-98.9 F (37.2 C)] 98.3 F (36.8 C) (11/07 0321) Pulse Rate:  [74-80] 78 (11/07 0321) Cardiac Rhythm: Normal sinus rhythm (11/07 0340) Resp:  [16-20] 18 (11/07 0525) BP: (93-117)/(48-69) 117/69 (11/07 0321) SpO2:  [93 %-96 %] 96 % (11/07 0321) Weight:  [68.4 kg] 68.4 kg (11/07 0525)  Intake/Output from previous day: 11/06 0701 - 11/07 0700 In: 350 [P.O.:350] Out: 300 [Urine:300]  General appearance: alert, cooperative, and no distress Heart: regular rate and rhythm Lungs: clear to auscultation bilaterally Abdomen: soft, non-tender; bowel sounds normal; no masses,  no organomegaly Extremities: edema none present Wound: clean and dry, ecchymosis BLE  Lab Results: No results for input(s): "WBC", "HGB", "HCT", "PLT" in the last 72 hours. BMET:  Recent Labs    02/06/23 0329 02/07/23 0405  NA 136 136  K 3.1* 4.2  CL 102 103  CO2 23 21*  GLUCOSE 147* 163*  BUN 34* 33*  CREATININE 2.95* 2.70*  CALCIUM 9.4 9.7    PT/INR:  Recent Labs    02/07/23 0405  LABPROT 17.8*  INR 1.4*   ABG    Component Value Date/Time   PHART 7.382 01/31/2023 1930   HCO3 24.3 01/31/2023 1930   TCO2 25 01/31/2023 1930   ACIDBASEDEF 1.0 01/31/2023 1930   O2SAT 97 01/31/2023 1930   CBG (last 3)  Recent Labs    02/06/23 1701 02/06/23 2115 02/07/23 0558  GLUCAP 183* 154* 157*     Assessment/Plan: S/P Procedure(s) (LRB): AORTIC VALVE REPLACEMENT (AVR) USING 21 MM INSPIRIS RESILIA AORTIC VALVE (N/A) CORONARY ARTERY BYPASS GRAFTING (CABG)X THREE USING LEFT INTERNAL MAMMARY ARTERY AND RIGHT GREATER SAPHENEOUS VEIN HARVESTED ENDOSCOPICLY (N/A) TRANSESOPHAGEAL ECHOCARDIOGRAM (N/A)  CV- PAF, maintaining NSR past 24 hours, continue Amiodarone 400 mg BID, Lopressor 12.5 mg BID as BP allows INR 1.4, will repeat 2.5 mg daily Pulm- no acute issues Renal- AKI, resolving, creatinine down to 2.70  continue to monitor Hypokalemia, chronic patient states given potassium prior to admission.. K is 4.2 this morning.. continue potassium supplement Dispo- patient doing very well.. will monitor today.. if creatinine continues to improve and no further A. Fib will plan for d/c in AM   LOS: 7 days    Lowella Dandy, PA-C 02/07/2023   Chart reviewed, patient examined, agree with above. She looks and feels well. No further AF yesterday on Lopressor and amio and BP has been ok.  Creat improving.  Will watch her today and plan home tomorrow if no changes.

## 2023-02-08 LAB — BASIC METABOLIC PANEL
Anion gap: 12 (ref 5–15)
Anion gap: 11 (ref 5–15)
BUN: 33 mg/dL — ABNORMAL HIGH (ref 8–23)
BUN: 31 mg/dL — ABNORMAL HIGH (ref 8–23)
CO2: 21 mmol/L — ABNORMAL LOW (ref 22–32)
CO2: 21 mmol/L — ABNORMAL LOW (ref 22–32)
Calcium: 9.2 mg/dL (ref 8.9–10.3)
Calcium: 9 mg/dL (ref 8.9–10.3)
Chloride: 102 mmol/L (ref 98–111)
Chloride: 103 mmol/L (ref 98–111)
Creatinine, Ser: 2.59 mg/dL — ABNORMAL HIGH (ref 0.44–1.00)
Creatinine, Ser: 2.46 mg/dL — ABNORMAL HIGH (ref 0.44–1.00)
GFR, Estimated: 19 mL/min — ABNORMAL LOW (ref >60)
GFR, Estimated: 20 mL/min — ABNORMAL LOW (ref >60)
Glucose, Bld: 150 mg/dL — ABNORMAL HIGH (ref 70–99)
Glucose, Bld: 138 mg/dL — ABNORMAL HIGH (ref 70–99)
Potassium: 4 mmol/L (ref 3.5–5.1)
Potassium: 3.9 mmol/L (ref 3.5–5.1)
Sodium: 135 mmol/L (ref 135–145)
Sodium: 135 mmol/L (ref 135–145)

## 2023-02-08 LAB — GLUCOSE, CAPILLARY
Glucose-Capillary: 151 mg/dL — ABNORMAL HIGH (ref 70–99)
Glucose-Capillary: 176 mg/dL — ABNORMAL HIGH (ref 70–99)

## 2023-02-08 LAB — PROTIME-INR
INR: 1.9 — ABNORMAL HIGH (ref 0.8–1.2)
Prothrombin Time: 21.7 s — ABNORMAL HIGH (ref 11.4–15.2)

## 2023-02-08 MED ORDER — WARFARIN SODIUM 2 MG PO TABS: 2.0000 mg | ORAL_TABLET | Freq: Every day | ORAL | 3 refills | Status: DC

## 2023-02-08 MED ORDER — POTASSIUM CHLORIDE CRYS ER 20 MEQ PO TBCR: 20.0000 meq | EXTENDED_RELEASE_TABLET | Freq: Every day | ORAL | 3 refills | Status: DC

## 2023-02-08 MED ORDER — TRAMADOL HCL 50 MG PO TABS: 50.0000 mg | ORAL_TABLET | Freq: Four times a day (QID) | ORAL | 0 refills | Status: DC | PRN

## 2023-02-08 MED ORDER — AMOXICILLIN 500 MG PO TABS
ORAL_TABLET | ORAL | 0 refills | Status: AC
Start: 2023-02-08 — End: ?

## 2023-02-08 MED ORDER — AMIODARONE HCL 200 MG PO TABS: 400.0000 mg | ORAL_TABLET | Freq: Two times a day (BID) | ORAL | 1 refills | Status: DC

## 2023-02-08 MED ORDER — METOPROLOL TARTRATE 25 MG PO TABS: 12.5000 mg | ORAL_TABLET | Freq: Two times a day (BID) | ORAL | 3 refills | Status: DC

## 2023-02-08 MED ORDER — ACETAMINOPHEN 325 MG PO TABS: 650.0000 mg | ORAL_TABLET | Freq: Four times a day (QID) | ORAL | Status: AC | PRN

## 2023-02-08 NOTE — Progress Notes (Signed)
Alroy Bailiff to be D/C'd Home per MD order.  Discussed with the patient and all questions fully answered.  VSS, Skin clean, dry and intact without evidence of skin break down, no evidence of skin tears noted. IV catheter discontinued intact. Site without signs and symptoms of complications. Dressing and pressure applied.  An After Visit Summary was printed and given to the patient. Patient received prescription.  D/c education completed with patient/family including follow up instructions, medication list, d/c activities limitations if indicated, with other d/c instructions as indicated by MD - patient able to verbalize understanding, all questions fully answered.   Patient instructed to return to ED, call 911, or call MD for any changes in condition.   Patient escorted via WC, and D/C home via private auto.  Selena Batten Shawnetta Lein 02/08/2023 12:59 PM

## 2023-02-08 NOTE — Progress Notes (Signed)
      [address]       [place] [address]             [phone]    8 Days Post-Op Procedure(s) (LRB): AORTIC VALVE REPLACEMENT (AVR) USING 21 MM INSPIRIS RESILIA AORTIC VALVE (N/A) CORONARY ARTERY BYPASS GRAFTING (CABG)X THREE USING LEFT INTERNAL MAMMARY ARTERY AND RIGHT GREATER SAPHENEOUS VEIN HARVESTED ENDOSCOPICLY (N/A) TRANSESOPHAGEAL ECHOCARDIOGRAM (N/A)  Subjective:  Patient is feeling great.  She is ambulating without difficulty.  She feels up to going home.  Objective: Vital signs in last 24 hours: Temp:  [98.4 F (36.9 C)-98.7 F (37.1 C)] 98.4 F (36.9 C) (11/08 0302) Pulse Rate:  [68-83] 81 (11/08 0302) Cardiac Rhythm: Normal sinus rhythm (11/07 1954) Resp:  [18-20] 19 (11/08 0302) BP: (96-119)/(56-67) 118/67 (11/08 0302) SpO2:  [94 %-96 %] 96 % (11/08 0302) Weight:  [68.8 kg] 68.8 kg (11/08 0302)  Intake/Output from previous day: 11/07 0701 - 11/08 0700 In: 120 [P.O.:120] Out: 0   General appearance: alert, cooperative, and no distress Heart: regular rate and rhythm Lungs: clear to auscultation bilaterally Abdomen: soft, non-tender; bowel sounds normal; no masses,  no organomegaly Extremities: edema trace Wound: clean and dry, ecchymosis RLE  Lab Results: No results for input(s): "WBC", "HGB", "HCT", "PLT" in the last 72 hours. BMET:  Recent Labs    02/07/23 0405 02/08/23 0410  NA 136 135  K 4.2 4.0  CL 103 102  CO2 21* 21*  GLUCOSE 163* 150*  BUN 33* 33*  CREATININE 2.70* 2.59*  CALCIUM 9.7 9.2    PT/INR:  Recent Labs    02/08/23 0410  LABPROT 21.7*  INR 1.9*   ABG    Component Value Date/Time   PHART 7.382 01/31/2023 1930   HCO3 24.3 01/31/2023 1930   TCO2 25 01/31/2023 1930   ACIDBASEDEF 1.0 01/31/2023 1930   O2SAT 97 01/31/2023 1930   CBG (last 3)  Recent Labs    02/07/23 1646 02/07/23 2121 02/08/23 0610  GLUCAP 222* 133* 151*    Assessment/Plan: S/P Procedure(s) (LRB): AORTIC VALVE REPLACEMENT  (AVR) USING 21 MM INSPIRIS RESILIA AORTIC VALVE (N/A) CORONARY ARTERY BYPASS GRAFTING (CABG)X THREE USING LEFT INTERNAL MAMMARY ARTERY AND RIGHT GREATER SAPHENEOUS VEIN HARVESTED ENDOSCOPICLY (N/A) TRANSESOPHAGEAL ECHOCARDIOGRAM (N/A)  CV- PAF, maintaining NSR-- continue Amiodarone, Lopressor INR 1.9, will decrease coumadin at 2.0 mg daily Pulm- no acute issues, off oxygen Renal-AKI, creatinine improving 2.59 Hypokalemia- resolved Dispo- patient stable, will d/c home today   LOS: 8 days   Lowella Dandy, PA-C 02/08/2023

## 2023-02-08 NOTE — TOC Transition Note (Signed)
Transition of Care (TOC) - CM/SW Discharge Note Donn Pierini RN, BSN Transitions of Care Unit 4E- RN Case Manager See Treatment Team for direct phone #   Patient Details  Name: Darlene Kelly MRN: [id number] Date of Birth: 1950/05/07  Transition of Care [facility]) CM/SW Contact:  Darrold Span, RN Phone Number: 02/08/2023, 11:20 AM   Clinical Narrative:    Pt stable for transition home today, Family to transport home.   CM was notified by [facility] liaison that TCTS office made office protocol referral for [facility] needs- [facility] will follow up with pt post discharge. Liaison is aware of discharge home today for start of care.   No Further TOC needs. Noted.    Final next level of care: Home w Home Health Services Barriers to Discharge: No Barriers Identified   Patient Goals and CMS Choice CMS Medicare.gov Compare Post Acute Care list provided to::  (TCTS office referral)    Discharge Placement                 Home w/ [facility]        Discharge Plan and Services Additional resources added to the After Visit Summary for                  DME Arranged: N/A DME Agency: NA         HH Agency: [facility] Date HH Agency Contacted: 02/08/23 Time HH Agency Contacted: 1119 Representative spoke with at [facility] Agency: Aggie Cosier  Social Determinants of Health (SDOH) Interventions SDOH Screenings   Food Insecurity: Low Risk  (11/02/2022)   Received from [facility]  Utilities: Low Risk  (11/02/2022)   Received from [facility]  Tobacco Use: Medium Risk (01/31/2023)     Readmission Risk Interventions    02/08/2023   11:19 AM  Readmission Risk Prevention Plan  Transportation Screening Complete  PCP or Specialist Appt within 5-7 Days --  Medication Review (RN CM) Complete

## 2023-02-08 NOTE — Progress Notes (Signed)
CARDIAC REHAB PHASE I   Pt ready for discharge home. [facility] education completed. All questions and concerns addressed. Referral to [facility] for CRP2.     Woodroe Chen, RN BSN 02/08/2023 9:05 AM

## 2023-02-08 NOTE — Progress Notes (Signed)
Pt ambulated in hall independently x 470 feet

## 2023-02-09 DIAGNOSIS — Z48812 Encounter for surgical aftercare following surgery on the circulatory system: Secondary | ICD-10-CM

## 2023-02-11 ENCOUNTER — Ambulatory Visit: Payer: Medicare Other | Attending: Cardiology

## 2023-02-11 ENCOUNTER — Other Ambulatory Visit: Payer: Self-pay

## 2023-02-11 DIAGNOSIS — Z7901 Long term (current) use of anticoagulants: Secondary | ICD-10-CM | POA: Diagnosis not present

## 2023-02-11 DIAGNOSIS — Z952 Presence of prosthetic heart valve: Secondary | ICD-10-CM

## 2023-02-11 DIAGNOSIS — I4891 Unspecified atrial fibrillation: Secondary | ICD-10-CM

## 2023-02-11 LAB — POCT INR: INR: 3.1 — AB (ref 2.0–3.0)

## 2023-02-11 MED FILL — Sodium Bicarbonate IV Soln 8.4%: INTRAVENOUS | Qty: 100 | Status: AC

## 2023-02-11 MED FILL — Albumin, Human Inj 5%: INTRAVENOUS | Qty: 250 | Status: AC

## 2023-02-11 MED FILL — Sodium Chloride IV Soln 0.9%: INTRAVENOUS | Qty: 2000 | Status: AC

## 2023-02-11 MED FILL — Heparin Sodium (Porcine) Inj 1000 Unit/ML: INTRAMUSCULAR | Qty: 20 | Status: AC

## 2023-02-11 MED FILL — Electrolyte-R (PH 7.4) Solution: INTRAVENOUS | Qty: 4000 | Status: AC

## 2023-02-11 MED FILL — Mannitol IV Soln 20%: INTRAVENOUS | Qty: 500 | Status: AC

## 2023-02-11 NOTE — Patient Instructions (Addendum)
HOLD TODAY ONLY THEN CONTINUE 1 TABLET DAILY, EXCEPT 0.5 TABLET EVERY WEDNESDAY.  INR in 1 WEEK.  Amiodarone 400 mg bid until 11/15, then 200 mg bid   A full discussion of the nature of anticoagulants has been carried out.  A benefit risk analysis has been presented to the patient, so that they understand the justification for choosing anticoagulation at this time. The need for frequent and regular monitoring, precise dosage adjustment and compliance is stressed.  Side effects of potential bleeding are discussed.  The patient should avoid any OTC items containing aspirin or ibuprofen, and should avoid great swings in general diet.  Avoid alcohol consumption.  Call if any signs of abnormal bleeding.  [phone]

## 2023-02-12 ENCOUNTER — Telehealth (HOSPITAL_COMMUNITY): Payer: Self-pay

## 2023-02-12 NOTE — Telephone Encounter (Signed)
Pt insurance is active and benefits verified through Medicare a/b Co-pay 0, DED $240/$240 met, out of pocket 0/0 met, co-insurance 20%. no pre-authorization required. Passport, 02/12/2023@4 :31, REF# [id number]   2ndary insurance is active and benefits verified through [place]. Co-pay 0, DED 0/0 met, out of pocket 0/0 met, co-insurance 0%. No pre-authorization required.   How many CR sessions are covered? (for ICR)72 Is this a lifetime maximum or an annual maximum? annual Has the member used any of these services to date? no Is there a time limit (weeks/months) on start of program and/or program completion? no   Will contact patient to see if she is interested in the Cardiac Rehab Program. If interested, patient will need to complete follow up appt. Once completed, patient will be contacted for scheduling upon review by the RN Navigator.

## 2023-02-14 ENCOUNTER — Ambulatory Visit: Payer: Self-pay

## 2023-02-14 DIAGNOSIS — Z4802 Encounter for removal of sutures: Secondary | ICD-10-CM

## 2023-02-14 NOTE — Progress Notes (Signed)
Patient arrived for nurse visit to remove sutures post- procedure AVR/ CABGx3 with Dr. Laneta Simmers 10/31.  Four Sutures removed with no signs/ symptoms of infection noted.  Incisions well approximated. Patient tolerated procedure well.  Patient/ family instructed to keep the incision sites clean and dry.  Patient/ family acknowledged instructions given.  Patient only taking Tylenol for pain at this point.

## 2023-02-17 ENCOUNTER — Other Ambulatory Visit: Payer: Self-pay

## 2023-02-17 ENCOUNTER — Emergency Department (HOSPITAL_COMMUNITY): Payer: Medicare Other

## 2023-02-17 ENCOUNTER — Encounter (HOSPITAL_COMMUNITY): Payer: Self-pay

## 2023-02-17 ENCOUNTER — Emergency Department (HOSPITAL_COMMUNITY)
Admission: EM | Admit: 2023-02-17 | Discharge: 2023-02-17 | Disposition: A | Discharge status: Home / Self Care | Payer: Medicare Other | Attending: Emergency Medicine | Admitting: Emergency Medicine

## 2023-02-17 DIAGNOSIS — Z7901 Long term (current) use of anticoagulants: Secondary | ICD-10-CM | POA: Diagnosis not present

## 2023-02-17 DIAGNOSIS — Z7982 Long term (current) use of aspirin: Secondary | ICD-10-CM | POA: Diagnosis not present

## 2023-02-17 DIAGNOSIS — Z91040 Latex allergy status: Secondary | ICD-10-CM | POA: Diagnosis not present

## 2023-02-17 DIAGNOSIS — J90 Pleural effusion, not elsewhere classified: Secondary | ICD-10-CM | POA: Diagnosis not present

## 2023-02-17 DIAGNOSIS — I700 Atherosclerosis of aorta: Secondary | ICD-10-CM | POA: Diagnosis not present

## 2023-02-17 DIAGNOSIS — K449 Diaphragmatic hernia without obstruction or gangrene: Secondary | ICD-10-CM | POA: Insufficient documentation

## 2023-02-17 DIAGNOSIS — K5730 Diverticulosis of large intestine without perforation or abscess without bleeding: Secondary | ICD-10-CM | POA: Diagnosis not present

## 2023-02-17 DIAGNOSIS — I2510 Atherosclerotic heart disease of native coronary artery without angina pectoris: Secondary | ICD-10-CM | POA: Diagnosis not present

## 2023-02-17 DIAGNOSIS — M546 Pain in thoracic spine: Secondary | ICD-10-CM | POA: Diagnosis present

## 2023-02-17 DIAGNOSIS — R109 Unspecified abdominal pain: Secondary | ICD-10-CM

## 2023-02-17 DIAGNOSIS — N390 Urinary tract infection, site not specified: Secondary | ICD-10-CM | POA: Diagnosis not present

## 2023-02-17 LAB — CBC WITH DIFFERENTIAL/PLATELET
Abs Immature Granulocytes: 0.03 10*3/uL (ref 0.00–0.07)
Basophils Absolute: 0 10*3/uL (ref 0.0–0.1)
Basophils Relative: 0 %
Eosinophils Absolute: 0.1 10*3/uL (ref 0.0–0.5)
Eosinophils Relative: 1 %
HCT: 29.9 % — ABNORMAL LOW (ref 36.0–46.0)
Hemoglobin: 9.5 g/dL — ABNORMAL LOW (ref 12.0–15.0)
Immature Granulocytes: 0 %
Lymphocytes Relative: 8 %
Lymphs Abs: 0.6 10*3/uL — ABNORMAL LOW (ref 0.7–4.0)
MCH: 30 pg (ref 26.0–34.0)
MCHC: 31.8 g/dL (ref 30.0–36.0)
MCV: 94.3 fL (ref 80.0–100.0)
Monocytes Absolute: 0.5 10*3/uL (ref 0.1–1.0)
Monocytes Relative: 7 %
Neutro Abs: 5.8 10*3/uL (ref 1.7–7.7)
Neutrophils Relative %: 84 %
Platelets: 337 10*3/uL (ref 150–400)
RBC: 3.17 MIL/uL — ABNORMAL LOW (ref 3.87–5.11)
RDW: 14.1 % (ref 11.5–15.5)
WBC: 7 10*3/uL (ref 4.0–10.5)
nRBC: 0 % (ref 0.0–0.2)

## 2023-02-17 LAB — COMPREHENSIVE METABOLIC PANEL
ALT: 25 U/L (ref 0–44)
AST: 25 U/L (ref 15–41)
Albumin: 2.9 g/dL — ABNORMAL LOW (ref 3.5–5.0)
Alkaline Phosphatase: 61 U/L (ref 38–126)
Anion gap: 10 (ref 5–15)
BUN: 13 mg/dL (ref 8–23)
CO2: 24 mmol/L (ref 22–32)
Calcium: 9.4 mg/dL (ref 8.9–10.3)
Chloride: 101 mmol/L (ref 98–111)
Creatinine, Ser: 1.16 mg/dL — ABNORMAL HIGH (ref 0.44–1.00)
GFR, Estimated: 50 mL/min — ABNORMAL LOW (ref >60)
Glucose, Bld: 163 mg/dL — ABNORMAL HIGH (ref 70–99)
Potassium: 3.6 mmol/L (ref 3.5–5.1)
Sodium: 135 mmol/L (ref 135–145)
Total Bilirubin: 0.4 mg/dL (ref <1.2)
Total Protein: 6.4 g/dL — ABNORMAL LOW (ref 6.5–8.1)

## 2023-02-17 LAB — URINALYSIS, ROUTINE W REFLEX MICROSCOPIC
Bilirubin Urine: NEGATIVE
Glucose, UA: NEGATIVE mg/dL
Hgb urine dipstick: NEGATIVE
Ketones, ur: NEGATIVE mg/dL
Nitrite: NEGATIVE
Protein, ur: 100 mg/dL — AB
Specific Gravity, Urine: 1.019 (ref 1.005–1.030)
WBC, UA: >50 WBC/hpf (ref 0–5)
pH: 5 (ref 5.0–8.0)

## 2023-02-17 MED ORDER — FENTANYL CITRATE PF 50 MCG/ML IJ SOSY
25.0000 ug | PREFILLED_SYRINGE | Freq: Once | INTRAMUSCULAR | Status: AC
Start: 2023-02-17 — End: 2023-02-17
  Administered 2023-02-17: 25 ug via INTRAVENOUS
  Filled 2023-02-17: qty 1

## 2023-02-17 MED ORDER — FOSFOMYCIN TROMETHAMINE 3 G PO PACK
3.0000 g | PACK | Freq: Once | ORAL | Status: AC
  Administered 2023-02-17: 3 g via ORAL
  Filled 2023-02-17: qty 3

## 2023-02-17 NOTE — ED Notes (Signed)
Pt requesting pain medication. RN notified EDP

## 2023-02-17 NOTE — ED Triage Notes (Signed)
2 weeks post op open heart.  Reports lumbar back pain started when she was in the hospital but has now got worse and  in right flank.  Patient worried about her kidney.  Denies urinary symptoms, chest pain, sob n/v

## 2023-02-17 NOTE — ED Provider Notes (Signed)
[place] EMERGENCY DEPARTMENT AT [facility] Provider Note   CSN: [id number] Arrival date & time: 02/17/23  0805     History  Chief Complaint  Patient presents with   Back Pain    Darlene Kelly is a 72 y.o. female.  HPI Patient presents with her husband who assists with the history.  Patient presents with concern for right posterior thoracic pain.  History is notable for bypass surgery 2 weeks ago.  She notes that in terms of that recovery she has been doing well, with no fevers, chills, drainage from her incision, chest pain.  However, while in the hospital, and since she has not newly developed pain in her back.  Initially pain was left, and right, but now she only has right Hemi thoracic pain in the roughly L2 region.  No dysuria, hematuria, pain is waxing, waning, with inability to achieve a position of comfort when it is severe.    Home Medications Prior to Admission medications   Medication Sig Start Date End Date Taking? Authorizing Provider  acetaminophen (TYLENOL) 325 MG tablet Take 2 tablets (650 mg total) by mouth every 6 (six) hours as needed for mild pain (pain score 1-3). 02/08/23   Barrett, Rae Roam, PA-C  amiodarone (PACERONE) 200 MG tablet Take 2 tablets (400 mg total) by mouth 2 (two) times daily. X 7 days, then decrease to 200 mg twice daily x 7 days, then decrease to 200 mg daily 02/08/23   Barrett, Erin R, PA-C  amoxicillin (AMOXIL) 500 MG tablet Take 4 tablets (2000 mg) 1 hour prior to dental cleaning/procedure 02/08/23   Barrett, Rae Roam, PA-C  aspirin EC 81 MG tablet Take 81 mg by mouth every evening. Swallow whole.    [provider]  Calcium-Magnesium-Zinc (CAL-MAG-ZINC PO) Take 1 tablet by mouth 2 (two) times daily.    [provider]  Coenzyme Q10 (COQ10) 400 MG CAPS Take 400 mg by mouth daily.    [provider]  Cyanocobalamin (B-12) 3000 MCG CAPS Take 3,000 mcg by mouth daily at 12 noon.    [provider]   gabapentin (NEURONTIN) 300 MG capsule Take 1 capsule (300 mg total) by mouth 3 (three) times daily. Take a 300 mg capsule three times a day for two weeks Then a 300 mg capsule twice a day for two weeks Then a 300 mg capsule once a day for two weeks Patient taking differently: Take 300 mg by mouth every evening. 01/20/19   Porterfield, Amber, PA-C  Melatonin 10 MG TABS Take 1 tablet by mouth at bedtime.    [provider]  metoprolol tartrate (LOPRESSOR) 25 MG tablet Take 0.5 tablets (12.5 mg total) by mouth 2 (two) times daily. Hold for Blood Pressure less than 100 02/08/23   Barrett, Erin R, PA-C  potassium chloride SA (KLOR-CON M) 20 MEQ tablet Take 1 tablet (20 mEq total) by mouth daily. 02/08/23   Barrett, Erin R, PA-C  rosuvastatin (CRESTOR) 40 MG tablet Take 20 mg by mouth 2 (two) times daily.    [provider]  traMADol (ULTRAM) 50 MG tablet Take 1 tablet (50 mg total) by mouth every 6 (six) hours as needed for moderate pain (pain score 4-6). 02/08/23   Barrett, Erin R, PA-C  vitamin C (ASCORBIC ACID) 500 MG tablet Take 500 mg by mouth daily.    [provider]  warfarin (COUMADIN) 2 MG tablet Take 1 tablet (2 mg total) by mouth daily at 4 PM.  02/08/23   Barrett, Erin R, PA-C      Allergies    Latex and Lipitor [atorvastatin]    Review of Systems   Review of Systems  Physical Exam Updated Vital Signs BP 113/60   Pulse 60   Temp 98.2 F (36.8 C) (Oral)   Resp 18   Ht 5\' 1"  (1.549 m)   Wt 68.5 kg   SpO2 97%   BMI 28.53 kg/m  Physical Exam Vitals and nursing note reviewed.  Constitutional:      General: She is not in acute distress.    Appearance: She is well-developed.  HENT:     Head: Normocephalic and atraumatic.  Eyes:     Conjunctiva/sclera: Conjunctivae normal.  Cardiovascular:     Rate and Rhythm: Normal rate and regular rhythm.     Pulses: Normal pulses.  Pulmonary:     Effort: Pulmonary effort is normal. No respiratory distress.      Breath sounds: Normal breath sounds. No stridor.  Abdominal:     General: There is no distension.  Musculoskeletal:       Arms:  Skin:    General: Skin is warm and dry.     Comments: No rash per family  Neurological:     Mental Status: She is alert and oriented to person, place, and time.     Cranial Nerves: No cranial nerve deficit.     Comments: Unremarkable patient flexes each hip to command with little referred pain bilaterally  Psychiatric:        Mood and Affect: Mood normal.     ED Results / Procedures / Treatments   Labs (all labs ordered are listed, but only abnormal results are displayed) Labs Reviewed  COMPREHENSIVE METABOLIC PANEL - Abnormal; Notable for the following components:      Result Value   Glucose, Bld 163 (*)    Creatinine, Ser 1.16 (*)    Total Protein 6.4 (*)    Albumin 2.9 (*)    GFR, Estimated 50 (*)    All other components within normal limits  CBC WITH DIFFERENTIAL/PLATELET - Abnormal; Notable for the following components:   RBC 3.17 (*)    Hemoglobin 9.5 (*)    HCT 29.9 (*)    Lymphs Abs 0.6 (*)    All other components within normal limits  URINALYSIS, ROUTINE W REFLEX MICROSCOPIC - Abnormal; Notable for the following components:   APPearance HAZY (*)    Protein, ur 100 (*)    Leukocytes,Ua LARGE (*)    Bacteria, UA RARE (*)    Non Squamous Epithelial 0-5 (*)    All other components within normal limits    EKG None  Radiology CT Renal Stone Study  Result Date: 02/17/2023 CLINICAL DATA:  Right flank pain EXAM: CT ABDOMEN AND PELVIS WITHOUT CONTRAST TECHNIQUE: Multidetector CT imaging of the abdomen and pelvis was performed following the standard protocol without IV contrast. RADIATION DOSE REDUCTION: This exam was performed according to the departmental dose-optimization program which includes automated exposure control, adjustment of the mA and/or kV according to patient size and/or use of iterative reconstruction technique.  COMPARISON:  None Available. FINDINGS: Lower chest: Small bilateral pleural effusions with associated atelectasis or consolidation. Heart size within normal limits. Aortic and coronary artery atherosclerosis. Aortic valve replacement. Hepatobiliary: Unremarkable unenhanced appearance of the liver. Layering hyperattenuating material in the gallbladder may reflect biliary sludge or prior contrast excretion. No hyperdense gallstone. No gallbladder wall thickening or pericholecystic inflammatory changes by CT. No  biliary dilatation. Pancreas: Unremarkable. No pancreatic ductal dilatation or surrounding inflammatory changes. Spleen: Normal in size without focal abnormality. Adrenals/Urinary Tract: Unremarkable adrenal glands. Bilateral kidneys within normal limits. Vascular calcifications within the renal hila bilaterally. No renal stone or hydronephrosis. No ureteral calculi identified. Numerous pelvic phleboliths. Urinary bladder appears unremarkable. Stomach/Bowel: Moderate-sized hiatal hernia. Stomach is otherwise within normal limits. Appendix appears normal (series 3, image 57). Colonic diverticulosis. No evidence of bowel wall thickening, distention, or inflammatory changes. Moderate volume stool within the colon. Vascular/Lymphatic: Aortoiliac atherosclerosis.  No lymphadenopathy. Reproductive: Uterus and bilateral adnexa are unremarkable. Other: Small volume free fluid within the dependent portion of pelvis. No organized fluid collections. No pneumoperitoneum. Musculoskeletal: No acute or significant osseous findings. IMPRESSION: 1. No acute abdominopelvic findings. Specifically, no evidence of obstructive uropathy. 2. Small bilateral pleural effusions with associated atelectasis or consolidation. 3. Moderate-sized hiatal hernia. 4. Colonic diverticulosis without evidence of acute diverticulitis. 5. Small volume free fluid within the pelvis, nonspecific. 6. Aortic and coronary artery atherosclerosis  (ICD10-I70.0). Electronically Signed   By: Duanne Guess D.O.   On: 02/17/2023 10:56    Procedures Procedures    Medications Ordered in ED Medications  fosfomycin (MONUROL) packet 3 g (has no administration in time range)  fentaNYL (SUBLIMAZE) injection 25 mcg (25 mcg Intravenous Given 02/17/23 1005)    ED Course/ Medical Decision Making/ A&P                                 Medical Decision Making Adult female recent cardiothoracic surgery now presents with flank pain, waxing, waning, concern for musculoskeletal versus renal etiology.  Absent fever, chills, nausea, vomiting, lower suspicion for infection. With recent cardiac history consideration of electrolyte abnormalities, possible implications on recovery as well. Cardiac 65 sinus normal Pulse ox 100% room air normal   Amount and/or Complexity of Data Reviewed Independent Historian: spouse External Data Reviewed: radiology and notes.    Details: CT prior to surgery reviewed Labs: ordered. Decision-making details documented in ED Course. Radiology: ordered.  Risk Prescription drug management. Decision regarding hospitalization. Diagnosis or treatment significantly limited by social determinants of health.  CT IMPRESSION: 1. Aortic and coronary artery atherosclerosis. 2. No aortic stenosis, aneurysm or dissection. 3. 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 4. Mild cardiomegaly. 5. Moderate-to-large hiatal hernia. 6. Mild lower lobe bronchitis. 7. Mild subpleural reticulation in the lung bases without honeycombing. 8. 5 mm left upper lobe nodule. Six-month follow-up study recommended to ensure stability given the patient's cancer history. 9. 3.3 x 2.1 cm fluid collection in the upper outer right breast surrounded by surgical clips and dystrophic calcifications. This is probably a  postoperative seroma or hematoma. Correlate with most recent breast imaging studies for possible significance. Recurrent neoplasm is not strictly excluded. 10. Hepatic steatosis.   Aortic Atherosclerosis (ICD10-I70.0).     Electronically Signed   By: Almira Bar M.D.   On: 02/04/2023 00:59 11:37 AM Patient went, alert, in no distress.  With flank pain, concern for obstruction, infection, versus musculoskeletal discomfort eval as above.  Findings with questionable urinary tract infection, no evidence for bacteremia, sepsis, no CT evidence for pyelonephritis.  Patient appropriate for, comfortable with outpatient follow-up        Final Clinical Impression(s) / ED Diagnoses Final diagnoses:  Lower urinary tract infectious disease  Flank pain    Rx / DC Orders ED Discharge Orders     None         Gerhard Munch, MD 02/17/23 1137

## 2023-02-17 NOTE — Discharge Instructions (Addendum)
As discussed, your evaluation today has been largely reassuring.  But, it is important that you monitor your condition carefully, and do not hesitate to return to the ED if you develop new, or concerning changes in your condition.  For additional relief of your flank pain, diclofenac cream, which is available over-the-counter will be beneficial.

## 2023-02-17 NOTE — ED Notes (Signed)
Patient transported to CT 

## 2023-02-22 ENCOUNTER — Ambulatory Visit: Payer: Medicare Other | Attending: Cardiovascular Disease | Admitting: *Deleted

## 2023-02-22 DIAGNOSIS — Z7901 Long term (current) use of anticoagulants: Secondary | ICD-10-CM | POA: Diagnosis not present

## 2023-02-22 DIAGNOSIS — I4891 Unspecified atrial fibrillation: Secondary | ICD-10-CM | POA: Diagnosis present

## 2023-02-22 DIAGNOSIS — Z952 Presence of prosthetic heart valve: Secondary | ICD-10-CM | POA: Diagnosis not present

## 2023-02-22 LAB — POCT INR: INR: 1.4 — AB (ref 2.0–3.0)

## 2023-02-22 NOTE — Patient Instructions (Addendum)
Description   Today take 1.5 tablets of warfarin and tomorrow take 1.5 tablets then START 1 TABLET DAILY. Recheck INR in 1 WEEK.  Amiodarone 400 mg bid until 11/15, then 200 mg bid, NOW 1 daily.

## 2023-02-26 ENCOUNTER — Ambulatory Visit: Payer: Medicare Other | Attending: Cardiology | Admitting: Cardiology

## 2023-02-26 ENCOUNTER — Other Ambulatory Visit: Payer: Self-pay | Admitting: Surgery

## 2023-02-26 ENCOUNTER — Ambulatory Visit (INDEPENDENT_AMBULATORY_CARE_PROVIDER_SITE_OTHER): Payer: Medicare Other | Admitting: *Deleted

## 2023-02-26 ENCOUNTER — Encounter: Payer: Self-pay | Admitting: Cardiology

## 2023-02-26 VITALS — BP 122/82 | Ht 61.0 in | Wt 152.0 lb

## 2023-02-26 DIAGNOSIS — Z7901 Long term (current) use of anticoagulants: Secondary | ICD-10-CM

## 2023-02-26 DIAGNOSIS — I350 Nonrheumatic aortic (valve) stenosis: Secondary | ICD-10-CM | POA: Diagnosis not present

## 2023-02-26 DIAGNOSIS — I4891 Unspecified atrial fibrillation: Secondary | ICD-10-CM | POA: Diagnosis present

## 2023-02-26 DIAGNOSIS — Z951 Presence of aortocoronary bypass graft: Secondary | ICD-10-CM

## 2023-02-26 DIAGNOSIS — Z952 Presence of prosthetic heart valve: Secondary | ICD-10-CM

## 2023-02-26 DIAGNOSIS — I10 Essential (primary) hypertension: Secondary | ICD-10-CM | POA: Diagnosis not present

## 2023-02-26 DIAGNOSIS — Z09 Encounter for follow-up examination after completed treatment for conditions other than malignant neoplasm: Secondary | ICD-10-CM | POA: Insufficient documentation

## 2023-02-26 DIAGNOSIS — E7800 Pure hypercholesterolemia, unspecified: Secondary | ICD-10-CM | POA: Insufficient documentation

## 2023-02-26 LAB — POCT INR: INR: 1.6 — AB (ref 2.0–3.0)

## 2023-02-26 NOTE — Patient Instructions (Signed)
Description   Today take 1.5 tablets of warfarin then START 1 TABLET DAILY EXCEPT 1.5 TABLETS ON WEDNESDAY AND SATURDAY. Recheck INR in 1 WEEK.  Amiodarone 400 mg bid until 11/15, then 200 mg bid, NOW 1 daily-02/22/23. Anticoagulation Clinic [phone]

## 2023-02-26 NOTE — Patient Instructions (Signed)
Medication Instructions:  Your physician recommends that you continue on your current medications as directed. Please refer to the Current Medication list given to you today.  *If you need a refill on your cardiac medications before your next appointment, please call your pharmacy*   Lab Work: TODAY: FASTING LIPID PANEL  If you have labs (blood work) drawn today and your tests are completely normal, you will receive your results only by: MyChart Message (if you have MyChart) OR A paper copy in the mail If you have any lab test that is abnormal or we need to change your treatment, we will call you to review the results.   Testing/Procedures: NONE   Follow-Up: At [facility], you and your health needs are our priority.  As part of our continuing mission to provide you with exceptional heart care, we have created designated Provider Care Teams.  These Care Teams include your primary Cardiologist (physician) and Advanced Practice Providers (APPs -  Physician Assistants and Nurse Practitioners) who all work together to provide you with the care you need, when you need it.  We recommend signing up for the patient portal called "MyChart".  Sign up information is provided on this After Visit Summary.  MyChart is used to connect with patients for Virtual Visits (Telemedicine).  Patients are able to view lab/test results, encounter notes, upcoming appointments, etc.  Non-urgent messages can be sent to your provider as well.   To learn more about what you can do with MyChart, go to [email or web address].    Your next appointment:   3 month(s)  Provider:   DR. [name] OR Perlie Gold, PA-C

## 2023-02-26 NOTE — Progress Notes (Signed)
Cardiology Office Note:   Date:  02/26/2023  ID:  Darlene Kelly, DOB 20-Nov-1950, MRN [id number] PCP: Trisha Mangle, FNP  [facility] Providers Cardiologist:  None    History of Present Illness:   Discussed the use of AI scribe software for clinical note transcription with the patient, who gave verbal consent to proceed.  History of Present Illness   The patient is a 72 year old individual with a complex medical history including severe aortic stenosis (s/p SVR), CAD s/p CABGx3, hypertension, hyperlipidemia, right-sided breast cancer (status post chemo, radiation, and surgery), bilateral carotid artery atherosclerosis, and a history of rheumatic fever. The patient is a former smoker.  In October, the patient was referred to Dr. [name] for evaluation of severe aortic stenosis. An echocardiogram revealed normal left ventricular ejection fraction with grade two diastolic dysfunction, normal RV function, and aortic stenosis with calcified restricted leaflets, peak velocity of 4.2 m/s, peak and mean gradients of 71 and 38 mmHg, dimensionless index of 0.26, and calculated aortic valve area of 0.66 cm. In addition, her stroke-volume index was noted to be low at 31. The patient underwent left heart and right heart catheterization as part of the workup for possible valve repair and was found to have heavy mitral annular calcification, severe right coronary artery stenosis, severe distal left main and osteocircumflex stenosis, and moderate eccentric proximal to mid LAD stenosis. Surgery recommended at that time.   The patient underwent CABGx3 and an aortic valve replacement with a bioprosthetic valve on 01/31/23. Postoperatively, the patient developed atrial fibrillation and was started on amiodarone with conversion to normal sinus rhythm prior to discharge and subsequent transition to oral dosing of amiodarone. The patient experienced recurrent atrial fibrillation five days postoperatively  but again converted back to a normal sinus rhythm.  Since the surgery, the patient reports feeling better with improved exertional tolerance and less fatigue. The patient experiences shortness of breath upon waking in the morning but reports that it resolves upon moving. The patient also reports occasional palpitations and brief periods of dizziness when lying down at night. The patient denies any significant chest pain or discomfort, swelling in the legs, or headaches.  The patient was previously on metformin but was advised to discontinue it post-surgery. The patient is currently on amiodarone, metoprolol, warfarin, and Crestor 20mg  BID. The patient reports tolerating these medications well, with the exception of occasional back spasms associated with tramadol, which the patient was taking postoperatively. The patient also takes a potassium supplement daily.  The patient has a history of carotid artery atherosclerosis and is scheduled for a repeat carotid artery duplex early next year for annual monitoring. The patient also wears an Apple Watch, which is set up to monitor for atrial fibrillation.       Studies Reviewed:    EKG:   EKG Interpretation Date/Time:  Tuesday February 26 2023 08:12:55 EST Ventricular Rate:  71 PR Interval:  192 QRS Duration:  132 QT Interval:  452 QTC Calculation: 491 R Axis:   102  Text Interpretation: Normal sinus rhythm Rightward axis Non-specific intra-ventricular conduction block T wave abnormality, consider inferior ischemia Confirmed by Perlie Gold [phone]) on 02/26/2023 8:23:40 AM   Carotid artery duplex 12/12/2022:  Duplex suggests stenosis in the right internal carotid artery (16-49%).  <50% stenosis in the right external carotid artery.  Duplex suggests stenosis in the left internal carotid artery (1-15%).  Antegrade right vertebral artery flow. Antegrade left vertebral artery  flow.  No significant change from 05/31/2022.  Follow up in one year is  appropriate  if clinically indicated.   01/15/23 LHC/RHC  1.  Known severe aortic stenosis on noninvasive assessment 2.  Heavy mitral annular calcification 3.  Severe RCA stenosis (large dominant vessel) 4.  Severe distal left main/ostial circumflex stenosis 5.  Moderate eccentric proximal to mid LAD stenosis   Recommendations: Surgical consultation for CABG/AVR    Risk Assessment/Calculations:    CHA2DS2-VASc Score = 6   This indicates a 9.7% annual risk of stroke. The patient's score is based upon: CHF History: 1 HTN History: 1 Diabetes History: 1 Stroke History: 0 Vascular Disease History: 1 Age Score: 1 Gender Score: 1             Physical Exam:   VS:  BP 122/82   Ht 5\' 1"  (1.549 m)   Wt 152 lb (68.9 kg)   SpO2 93%   BMI 28.72 kg/m    Wt Readings from Last 3 Encounters:  02/26/23 152 lb (68.9 kg)  02/17/23 151 lb (68.5 kg)  02/08/23 151 lb 10.8 oz (68.8 kg)     Physical Exam Vitals reviewed.  Constitutional:      Appearance: Normal appearance.  HENT:     Head: Normocephalic.     Nose: Nose normal.  Eyes:     Pupils: Pupils are equal, round, and reactive to light.  Cardiovascular:     Rate and Rhythm: Normal rate and regular rhythm.     Pulses: Normal pulses.     Heart sounds: Murmur (faint over RUSB) heard.     No friction rub. No gallop.  Pulmonary:     Effort: Pulmonary effort is normal.     Breath sounds: Normal breath sounds.  Musculoskeletal:     Right lower leg: No edema.     Left lower leg: No edema.  Skin:    General: Skin is warm and dry.     Capillary Refill: Capillary refill takes less than 2 seconds.  Neurological:     General: No focal deficit present.     Mental Status: She is alert and oriented to person, place, and time.  Psychiatric:        Mood and Affect: Mood normal.        Behavior: Behavior normal.        Thought Content: Thought content normal.        Judgment: Judgment normal.    ASSESSMENT AND PLAN:      Assessment and Plan    Postoperative Management of Severe Aortic Stenosis and Coronary Artery Disease Status post CABG x3 and aortic valve replacement with bioprosthetic valve on January 31, 2023. Postoperative atrial fibrillation managed with amiodarone, currently in normal sinus rhythm. Improved exertional tolerance and reduced dyspnea. No significant chest pain or palpitations. Mild postoperative tenderness and occasional morning dyspnea. No significant side effects from amiodarone. Blood pressure well-controlled. Discussed the commonality of postoperative atrial fibrillation. Explained the need for lower cholesterol levels post-bypass due to increased vulnerability of bypass grafts to reocclusion. - Continue amiodarone 200 mg once daily - Continue metoprolol tartrate 12.5mg  BID - Continue ASA 81mg  - Continue warfarin for stroke prevention - Order fasting lipid panel - Schedule follow-up in 3 months with Dr. [name] or PA - Monitor for AFib using Apple Watch - Repeat echocardiogram s/p surgery is scheduled 03/14/23.  Atrial Fibrillation Postoperative atrial fibrillation managed with amiodarone. No significant palpitations or symptoms currently. EKG shows normal sinus rhythm with stable intraventricular conduction block.  - Continue amiodarone  200 mg once daily. Would consider discontinuing at next follow up if no significant recurrence of afib. - Continue Warfarin - Monitor for AFib using Apple Watch (set up today) - Schedule follow-up in 3 months with Dr. [name] or PA  Hyperlipidemia Currently on Crestor 20 mg twice daily with good tolerance. Previous issues with myalgias on higher doses taken once per day. Plan to check lipid levels to assess efficacy of current regimen. Discussed the importance of maintaining lower cholesterol levels post-bypass surgery to prevent reocclusion of bypass grafts. - Order fasting lipid panel - Review lipid panel results and adjust treatment if  necessary - Continue Crestor 20mg  BID   Hypertension Blood pressure well-controlled with current regimen.  - Continue current antihypertensive regimen - Monitor blood pressure regularly  Bilateral carotid stenosis February 2024 noted left ICA 50 to 69%. September repeat study with 16-49% stenosis in right ICA, <50% in right ECA. 1-15% in left ICA. No significant change from February.  -Plan repeat duplex in 1 year -Continue statin therapy and ASA  General Health Maintenance Routine health maintenance discussed. Plan to monitor lipid levels and follow up with primary care for additional routine checks. - Order fasting lipid panel - Coordinate lipid panel results with primary care - Follow up with primary care for A1c and other routine checks  Follow-up - Schedule follow-up in 3 months with Dr. [name] or PA - Follow up with Dr. Laneta Simmers tomorrow - Repeat carotid artery duplex early next year for annual monitoring.        Cardiac Rehabilitation Eligibility Assessment  The patient is ready to start cardiac rehabilitation pending clearance from the cardiac surgeon.        Signed, Perlie Gold, PA-C

## 2023-02-27 ENCOUNTER — Encounter: Payer: Self-pay | Admitting: Surgery

## 2023-02-27 ENCOUNTER — Ambulatory Visit (INDEPENDENT_AMBULATORY_CARE_PROVIDER_SITE_OTHER): Payer: Self-pay | Admitting: Surgery

## 2023-02-27 ENCOUNTER — Ambulatory Visit
Admission: RE | Admit: 2023-02-27 | Discharge: 2023-02-27 | Disposition: A | Discharge status: Home / Self Care | Payer: Medicare Other | Source: Ambulatory Visit | Attending: Surgery | Admitting: Surgery

## 2023-02-27 VITALS — BP 116/73 | HR 73 | Resp 18 | Ht 61.0 in | Wt 152.0 lb

## 2023-02-27 DIAGNOSIS — Z951 Presence of aortocoronary bypass graft: Secondary | ICD-10-CM

## 2023-02-27 DIAGNOSIS — Z952 Presence of prosthetic heart valve: Secondary | ICD-10-CM

## 2023-02-27 LAB — LIPID PANEL
Chol/HDL Ratio: 3.2 {ratio} (ref 0.0–4.4)
Cholesterol, Total: 173 mg/dL (ref 100–199)
HDL: 54 mg/dL (ref >39)
LDL Chol Calc (NIH): 89 mg/dL (ref 0–99)
Triglycerides: 178 mg/dL — ABNORMAL HIGH (ref 0–149)
VLDL Cholesterol Cal: 30 mg/dL (ref 5–40)

## 2023-02-27 NOTE — Progress Notes (Signed)
HPI: Patient returns for routine postoperative follow-up having undergone coronary bypass graft surgery x 3 and aortic valve replacement using a 21 mm [employer] INSPIRIS RESILIA pericardial valve on 01/31/2023. The patient's early postoperative recovery while in the hospital was notable for development of postoperative recurrent atrial fibrillation converted with amiodarone.  She was started on Coumadin since she had a new bioprosthetic aortic valve and recurrent atrial fibrillation. Since hospital discharge the patient reports that she has been feeling well.  Her stamina has been improving.  She is out walking every day.  She does notice some shortness of breath when she first wakes up in the morning but that resolves after she starts moving around.  She was in the emergency room on 02/17/2023 due to some upper back pain radiating into the lower back.  She had a CT renal stone study which ruled out renal stones.  It did show small bilateral pleural effusions with associated atelectasis.  She saw cardiology yesterday and was noted to be in normal sinus rhythm.   Current Outpatient Medications  Medication Sig Dispense Refill   acetaminophen (TYLENOL) 325 MG tablet Take 2 tablets (650 mg total) by mouth every 6 (six) hours as needed for mild pain (pain score 1-3).     amiodarone (PACERONE) 200 MG tablet Take 200 mg by mouth daily.     amoxicillin (AMOXIL) 500 MG tablet Take 4 tablets (2000 mg) 1 hour prior to dental cleaning/procedure 4 tablet 0   aspirin EC 81 MG tablet Take 81 mg by mouth every evening. Swallow whole.     Coenzyme Q10 (COQ10) 400 MG CAPS Take 400 mg by mouth daily.     Cyanocobalamin (B-12) 3000 MCG CAPS Take 3,000 mcg by mouth daily at 12 noon.     gabapentin (NEURONTIN) 300 MG capsule Take 1 capsule (300 mg total) by mouth 3 (three) times daily. Take a 300 mg capsule three times a day for two weeks Then a 300 mg capsule twice a day for two weeks Then a 300 mg capsule once a day  for two weeks (Patient taking differently: Take 300 mg by mouth every evening.) 84 capsule 0   Melatonin 10 MG TABS Take 1 tablet by mouth at bedtime.     metoprolol tartrate (LOPRESSOR) 25 MG tablet Take 0.5 tablets (12.5 mg total) by mouth 2 (two) times daily. Hold for Blood Pressure less than 100 60 tablet 3   potassium chloride SA (KLOR-CON M) 20 MEQ tablet Take 1 tablet (20 mEq total) by mouth daily. 30 tablet 3   rosuvastatin (CRESTOR) 40 MG tablet Take 20 mg by mouth 2 (two) times daily.     vitamin C (ASCORBIC ACID) 500 MG tablet Take 500 mg by mouth daily.     warfarin (COUMADIN) 2 MG tablet Take 1 tablet (2 mg total) by mouth daily at 4 PM. 30 tablet 3   No current facility-administered medications for this visit.    Physical Exam: BP 116/73 (BP Location: Left Arm, Patient Position: Sitting)   Pulse 73   Resp 18   Ht 5\' 1"  (1.549 m)   Wt 152 lb (68.9 kg)   SpO2 94% Comment: RA  BMI 28.72 kg/m  She looks well. Cardiac exam shows a regular rate and rhythm with normal heart sounds.  There is no murmur. Lungs are clear. The chest incision is healing well and the sternum is stable. There is no peripheral edema.  Diagnostic Tests:  Narrative & Impression  CLINICAL DATA:  Status post CABG.   EXAM: CHEST - 2 VIEW   COMPARISON:  Chest radiographs 02/02/2023, 02/01/2023, 01/31/2023, 01/29/2023   FINDINGS: Status post median sternotomy and aortic valve replacement. There is again an air-fluid level within a large hiatal hernia. Small bilateral pleural effusions appear slightly decreased on the left and stable to minimally increased on the right compared to 02/02/2023. Mild bibasilar linear subsegmental atelectasis. No pneumothorax. No acute skeletal abnormality. Moderate multilevel degenerative disc changes of the thoracic spine with moderate to high-grade anterior thoracolumbar junction disc space narrowing.   IMPRESSION: 1. Status post median sternotomy and aortic  valve replacement. 2. Small bilateral pleural effusions appear slightly decreased on the left and stable to minimally increased on the right compared to 02/02/2023. 3. Mild bibasilar linear subsegmental atelectasis. 4. Large hiatal hernia.     Electronically Signed   By: Neita Garnet M.D.   On: 02/27/2023 08:29    Impression:  Overall I think she is doing well almost 1 month following her surgery.  She has a small amount of residual pleural effusion bilaterally on chest x-ray but that should resolve on its own.  I told her she can return to driving a car when she feels comfortable with that but should refrain from lifting anything heavier than 10 pounds for 3 months postoperatively.  She appears to be maintaining sinus rhythm on p.o. amiodarone.  Her INR yesterday was 1.6 and her Coumadin dose was adjusted in the anticoagulation clinic.  Plan:  She will continue to follow-up with cardiology and is scheduled for a follow-up echocardiogram in a few weeks.  I would think that the amiodarone can be discontinued when cardiology sees her back if she is maintaining sinus rhythm.  She does not need to be on anticoagulation with a bioprosthetic aortic valve so the Coumadin could be discontinued if she is maintaining sinus rhythm off of amiodarone.  This was postoperative atrial fibrillation and should be short-lived.   Alleen Borne, MD [facility] [phone]

## 2023-03-05 ENCOUNTER — Telehealth: Payer: Self-pay

## 2023-03-05 DIAGNOSIS — E7800 Pure hypercholesterolemia, unspecified: Secondary | ICD-10-CM

## 2023-03-05 NOTE — Progress Notes (Signed)
 30 - day hospital f/u   First Call:  Unsuccessful Unsuccessful:  Left message First Call Date:  03/05/2023 First Call Notes:  [email or web address] on pt's voicemail   Augustin Saupe, RN, [employer] [phone]

## 2023-03-05 NOTE — Telephone Encounter (Signed)
-----   Message from Perlie Gold sent at 02/27/2023  6:40 AM EST ----- Your LDL of 89 is still above guideline recommendations even with max dose Crestor. For my post-bypass patients, I target an LDL of 55. Given historical intolerance to various cholesterol agents, would highly recommend a referral to our lipid clinic pharmacy team for evaluation of PCSK9 agent. These are newer cholesterol medicines that are injected into the skin. They are very well tolerated by patient's who've had issues with statins.

## 2023-03-05 NOTE — Telephone Encounter (Signed)
Discussed lab results with patient.  Per Darlene Kelly: Your LDL of 89 is still above guideline recommendations even with max dose Crestor. For my post-bypass patients, I target an LDL of 55. Given historical intolerance to various cholesterol agents, would highly recommend a referral to our lipid clinic pharmacy team for evaluation of PCSK9 agent. These are newer cholesterol medicines that are injected into the skin. They are very well tolerated by patient's who've had issues with statins.    Patient agreeable to Lipid Clinic referral. Ordered placed. Patient verbalized understanding and expressed appreciation for call.

## 2023-03-06 ENCOUNTER — Ambulatory Visit: Payer: Medicare Other | Attending: Cardiovascular Disease | Admitting: *Deleted

## 2023-03-06 DIAGNOSIS — Z952 Presence of prosthetic heart valve: Secondary | ICD-10-CM | POA: Diagnosis not present

## 2023-03-06 DIAGNOSIS — I4891 Unspecified atrial fibrillation: Secondary | ICD-10-CM | POA: Insufficient documentation

## 2023-03-06 DIAGNOSIS — Z7901 Long term (current) use of anticoagulants: Secondary | ICD-10-CM | POA: Insufficient documentation

## 2023-03-06 LAB — POCT INR: INR: 1.5 — AB (ref 2.0–3.0)

## 2023-03-06 NOTE — Patient Instructions (Signed)
Description   Today take 2 tablets of warfarin then START 1.5 TABLETS DAILY EXCEPT 1 TABLET ON TUESDAY, THURSDAY, AND SATURDAY. Recheck INR in 1 WEEK.  Amiodarone 400 mg bid until 11/15, then 200 mg bid, NOW 1 daily-02/22/23. Anticoagulation Clinic [phone]

## 2023-03-13 ENCOUNTER — Ambulatory Visit: Payer: Medicare Other | Attending: Cardiology

## 2023-03-13 DIAGNOSIS — I4891 Unspecified atrial fibrillation: Secondary | ICD-10-CM | POA: Diagnosis not present

## 2023-03-13 DIAGNOSIS — Z952 Presence of prosthetic heart valve: Secondary | ICD-10-CM | POA: Diagnosis present

## 2023-03-13 LAB — POCT INR: INR: 1.5 — AB (ref 2.0–3.0)

## 2023-03-13 NOTE — Patient Instructions (Signed)
Description   Today take 2 tablets of warfarin then START 1.5 TABLETS DAILY.  Stay consistent with greens each week (2 per week) Recheck INR in 1 WEEK.   Amiodarone 400 mg bid until 11/15, then 200 mg bid, NOW 1 daily-02/22/23. Anticoagulation Clinic [phone]

## 2023-03-14 ENCOUNTER — Ambulatory Visit (HOSPITAL_COMMUNITY): Payer: Medicare Other | Attending: Cardiology

## 2023-03-14 DIAGNOSIS — Z952 Presence of prosthetic heart valve: Secondary | ICD-10-CM | POA: Diagnosis not present

## 2023-03-14 LAB — ECHOCARDIOGRAM COMPLETE
AR max vel: 0.99 cm2
AV Area VTI: 1.43 cm2
AV Area mean vel: 1.07 cm2
AV Mean grad: 14 mm[Hg]
AV Peak grad: 23.2 mm[Hg]
Ao pk vel: 2.41 m/s
Area-P 1/2: 3.53 cm2
MV M vel: 5.62 m/s
MV Peak grad: 126.3 mm[Hg]
MV VTI: 1.29 cm2
Radius: 0.7 cm
S' Lateral: 3.15 cm

## 2023-03-20 ENCOUNTER — Ambulatory Visit: Payer: Medicare Other | Attending: Cardiology | Admitting: *Deleted

## 2023-03-20 DIAGNOSIS — I4891 Unspecified atrial fibrillation: Secondary | ICD-10-CM | POA: Diagnosis not present

## 2023-03-20 DIAGNOSIS — Z952 Presence of prosthetic heart valve: Secondary | ICD-10-CM | POA: Insufficient documentation

## 2023-03-20 DIAGNOSIS — Z7901 Long term (current) use of anticoagulants: Secondary | ICD-10-CM | POA: Diagnosis present

## 2023-03-20 LAB — POCT INR: INR: 1.7 — AB (ref 2.0–3.0)

## 2023-03-20 MED ORDER — WARFARIN SODIUM 2 MG PO TABS: ORAL_TABLET | ORAL | 1 refills | Status: DC

## 2023-03-20 NOTE — Patient Instructions (Signed)
Description   Today take 2 tablets of warfarin then START 1.5 TABLETS DAILY EXCEPT 2 TABLETS ON MONDAY, WEDNESDAY, AND FRIDAY.  Stay consistent with greens each week (2 per week) Recheck INR in 1 WEEK.   Amiodarone 400 mg bid until 11/15, then 200 mg bid, NOW 1 daily-02/22/23. Anticoagulation Clinic [phone]

## 2023-03-25 NOTE — Progress Notes (Signed)
 [facility] [place]  Return Visit Darlene Kelly DOB: [birth year]  MRN: [id number] Visit Date: 03/25/2023  Encounter Provider: Elberta Ebb Cone, NP  Subjective Patient ID: Darlene Kelly is a 72 y.o. female.  Chief Complaint  Patient presents with  . Medicare Annual Wellness Visit Subsequent    SAWV  . Annual Exam    CPE, she is fasting today  . Medication Refill  . Diabetes  . Hyperlipidemia    The following information was reviewed by members of the visit team:  Tobacco  Allergies  Meds  Problems  Med Hx  Surg Hx  OB Status   Fam Hx      Type 2 Diabetes Pt has stable type 2 diabetes controlled by metformin . Presents today for follow-up. Fasting am sugars: 140 Forgets meds on 0 days out of a typical 7 day week.  No: nausea, vomiting, blurry vision, ab pain, shaking, sweating, HA, malaise lately.  Complications/Comorbidities- CAD, HLD Diet- healthy Exercise- walking daily  meds- metformin  1000 mg daily (previously on ozempic) ACEi/ARB- no ASA- yes Statin- yes Eye exam-  within past year Foot exam- today Urine albumin -  today   Recent A1c 6.1%, has stopped ozempic. Has lost around 24 lbs since last visit. Recently had CABG and valve repair. Recovering well.   Lab Results  Component Value Date   HGBA1C 6.1 01/29/2023   POCA1C 12.0 (A) 11/22/2022   Wt Readings from Last 3 Encounters:  03/25/23 67.1 kg (148 lb)  11/22/22 74.4 kg (164 lb)  11/02/22 75.8 kg (167 lb 3.2 oz)    Review of Systems  Constitutional:  Negative for activity change, appetite change, chills, diaphoresis, fatigue, fever and unexpected weight change.  HENT:  Negative for congestion, ear pain, sinus pressure, sore throat, tinnitus and trouble swallowing.   Eyes:  Negative for photophobia, redness and visual disturbance.  Respiratory:  Negative for cough, chest tightness, shortness of breath and wheezing.   Cardiovascular:  Negative for chest pain, palpitations and  leg swelling.  Gastrointestinal:  Negative for abdominal pain, constipation, diarrhea, nausea and vomiting.  Genitourinary:  Negative for dysuria, flank pain, frequency, hematuria, pelvic pain, urgency, vaginal bleeding and vaginal discharge.  Musculoskeletal:  Negative for arthralgias, back pain, joint swelling, myalgias and neck pain.  Skin:  Negative for pallor, rash and wound.  Neurological:  Negative for dizziness, tremors, syncope, weakness, light-headedness, numbness and headaches.  Psychiatric/Behavioral:  Negative for decreased concentration, dysphoric mood, hallucinations and sleep disturbance. The patient is not nervous/anxious and is not hyperactive.     Objective Physical Exam Vitals and nursing note reviewed.  Constitutional:      General: She is not in acute distress.    Appearance: Normal appearance. She is obese. She is not ill-appearing, toxic-appearing or diaphoretic.  Cardiovascular:     Rate and Rhythm: Normal rate and regular rhythm.     Pulses: Normal pulses.          Dorsalis pedis pulses are 2+ on the right side and 2+ on the left side.       Posterior tibial pulses are 2+ on the right side and 2+ on the left side.     Heart sounds: Normal heart sounds.  Pulmonary:     Effort: Pulmonary effort is normal.     Breath sounds: Normal breath sounds.  Musculoskeletal:     Cervical back: Normal range of motion and neck supple.     Right lower leg: No edema.     Left lower  leg: No edema.     Right foot: Normal range of motion. No deformity, bunion, Charcot foot, foot drop or prominent metatarsal heads.     Left foot: Normal range of motion. No deformity, bunion, Charcot foot, foot drop or prominent metatarsal heads.  Feet:     Right foot:     Protective Sensation: 10 sites tested.  10 sites sensed.     Skin integrity: Skin integrity normal.     Toenail Condition: Right toenails are normal.     Left foot:     Protective Sensation: 10 sites tested.  10 sites sensed.      Skin integrity: Skin integrity normal.     Toenail Condition: Left toenails are normal.  Skin:    General: Skin is warm and dry.     Capillary Refill: Capillary refill takes less than 2 seconds.  Neurological:     General: No focal deficit present.     Mental Status: She is alert and oriented to person, place, and time. Mental status is at baseline.  Psychiatric:        Mood and Affect: Mood normal.        Behavior: Behavior normal.        Thought Content: Thought content normal.        Judgment: Judgment normal.    Lab Results  Component Value Date   HGBA1C 6.1 01/29/2023   POCA1C 12.0 (A) 11/22/2022    BP 135/83   Pulse 60   Temp 98.1 F (36.7 C)   Resp 16   Ht 1.549 m (5' 1)   Wt 67.1 kg (148 lb)   SpO2 100%   BMI 27.96 kg/m   Assessment/Plan Diagnoses and all orders for this visit:  1. Encounter for Medicare annual wellness exam (Primary)  2. Need for hepatitis C screening test - Hepatitis C Virus (HCV) Antibody Screen With Confirmation  3. Type 2 diabetes mellitus with hyperglycemia, without long-term current use of insulin  (CMD) Doing well on current regimen. Ok to hold ozempic. Plan to repeat A1c in early February for stability off ozempic.  - CBC with Differential - Comprehensive Metabolic Panel - Albumin , Random Urine    Return in 6 months (on 09/23/2023) for med check.   Electronically signed: Elberta Ebb Cone, NP 03/25/2023  10:24 AM

## 2023-03-25 NOTE — Progress Notes (Signed)
 [facility]  - [facility] Medicare Wellness Visit Type:: Subsequent Annual Wellness Visit  Name: Darlene Kelly Date of Birth: 06/18/1950 Age: 72 y.o. MRN: [id number] Visit Date: 03/25/2023  History obtained from: patient  Living Arrangements/Support System/Health Assessment/Pain/Stress Marital status: married Number of children: 3 Occupation: Retired Living arrangements: lives with spouse/significant other Does the patient have a support system (family, friend, church, Conservation officer, nature, etc)?: Yes   Do you have any dental concerns?: No In the past month, have you experienced a change in your bladder control?: No   Do you have any difficulty obtaining your medications?: No   Do you have trouble consistently taking or remembering to take all of your medications as prescribed?: No Patient rates overall stress level as: None Does stress affect daily life?: No Typical amount of pain: none Does pain affect daily life?: No Are you currently prescribed opioids?: No                Depression Screening Today's PHQ-2 results: Patient Health Questionnaire-2 Score: 0 (03/25/23 0917)  Today's PHQ-9 result:    PHQ-9 Question # 9    Interpretation: PHQ-2 Interpretation: Negative (None-minimal Depression Severity) (03/25/23 0917)    Depression Plan: Normal/Negative Screening  Social History (Tobacco/Drugs/Sexual Activity) Darlene Kelly reports that she has quit smoking. She has never used smokeless tobacco. Tobacco Use?: No How many times in the past year have you used a recreational drug or used a prescription medication for nonmedical reasons?: None Risk factors for sexually transmitted infections (i.e., multiple sexual partners): No Are you bothered by sexual problems?: No  Alcohol Screening How often do you have a drink containing alcohol?: Never How many standard drinks containing alcohol do you have on a typical day?: Never, 1 or 2  drinks How often do you have six or more drinks on one occasion?: Never Audit-C Score: 0  Physical Activity Regular exercise?: Yes Exercise frequency (times per week): 7 Exercise intensity: moderate (like brisk walking)  Diet How many meals a day?: 3 Eats fruit and vegetables daily?: Yes Most meals are obtained by: preparing their own meals, eating out, having others provide food  Home and Transportation Safety All rugs have non-skid backing?: Yes All stairs or steps have railings?: Yes Grab bars in the bathtub or shower?: Yes Have non-skid surface in bathtub or shower?: Yes Good home lighting?: Yes Regular seat belt use?: Yes      Activities of Daily Living Feed self?: Yes Bathe self?: Yes Dress self?: Yes Use toilet without assistance?: Yes Walk without assistance?: Yes    Instrumental Activities of Daily Living Manage finances?: Yes Shop for themselves?: Yes Prepare meals?: Yes Use the telephone?: Yes Manage medications?: Yes   Performs basic housework/laundry?: Yes Drives?: Yes Primary transportation is: driving  Hearing Concerns about hearing?: No Uses hearing aids?: No Hear whispered voice? (Observed): Yes  Vision Concerns about vision?: No    Fall Risk Is the patient ambulatory?: Yes One or more falls in the last year:: No Feels unsteady when walking:: No  Cognitive Assessment Has a diagnosis of dementia or cognitive impairment?: No Are there any memory concerns by the patient, others, or providers?: No              Advance Directives Living will?: Yes Advance directive information provided to patient: Yes Healthcare POA?: Yes Name of Health Care Agent: Darlene Kelly Relationship to patient: Spouse Health Care Agent's phone number: [phone]         Other  History I reviewed and updated the following risk factors and conditions as appropriate: Reviewed/Updated: Problem List, Medical History, Surgical History, Family History,  Medications, Allergies Reviewed/Updated: Vital Signs (height, weight, and BP), Immunizations, Health Maintenance Patient Care Team Updated: Done  Vital Signs BP 135/83   Pulse 60   Temp 98.1 F (36.7 C)   Resp 16   Ht 1.549 m (5' 1)   Wt 67.1 kg (148 lb)   SpO2 100%   BMI 27.96 kg/m   Screening and Immunizations Health Maintenance Status       Date Due Completion Dates   Hepatitis C Screening Never done ---   Colorectal Cancer Screening Never done ---   Diabetes: Retinopathy Screening Combo 07/01/2020 07/02/2019 (Done)   Comment on 07/02/2019: See [facility]   Diabetes:  eGFR for Kidney Evaluation 03/21/2023 03/20/2022, 03/20/2021   Diabetes:  Quantitative uACR for Kidney Evaluation 03/22/2023 03/21/2022, 03/20/2021   ZOSTER VACCINE (2 of 3) 03/25/2023 (Originally 10/24/2011) 08/29/2011   Adult RSV (60+ Years or Pregnancy) (1 - Risk 60-74 years 1-dose series) 03/24/2024 (Originally 04/15/2011) ---   COVID-19 Vaccine (3 - 2024-25 season) 03/24/2024 (Originally 12/02/2022) 07/07/2019, 06/07/2019   DTaP/Tdap/Td Vaccines (2 - Td or Tdap) 08/21/2023 08/20/2013   Breast Cancer Screening (Mammogram) 09/04/2023 09/04/2022, 09/03/2022   Comment on 08/05/2019: See [facility] System   Diabetes: Hemoglobin A1C 01/29/2024 01/29/2023, 01/29/2023   Comprehensive Annual Visit 03/24/2024 03/25/2023, 03/21/2022   Medicare Annual Wellness (AWV) Subsequent Visits 03/24/2024 03/25/2023, 03/25/2023   Comment on 03/25/2023: SAWV   Diabetes: Foot Exam 03/24/2024 03/25/2023, 03/25/2023   Comment on 03/21/2022: See [facility] System   Comment on 03/04/2020: See Legacy System   Depression Screening 03/24/2024 03/25/2023       Immunization History  Administered Date(s) Administered  . Influenza, High-dose Seasonal, Quadrivalent, Preservative Free 12/08/2019, 03/20/2022  . Influenza, Unspecified 12/01/2017, 12/18/2018  . Influenza, adjuvanted, trivalent, PF 01/18/2023  . Influenza, high-dose, trivalent, PF 11/08/2016,  01/19/2021  . Influenza, split virus, trivalent, preservative 06/22/2015  . Pfizer SARS-CoV-2 Primary Series 12+ yrs 06/07/2019, 07/07/2019  . Pneumococcal Conjugate 13-Valent 11/08/2016  . Pneumococcal Polysaccharide 03/11/2018  . Tdap 08/20/2013  . Zoster, Live 08/29/2011    Assessment/Plan: Subsequent Annual Wellness Visit: The topics above were reviewed with the patient.  Healthy lifestyle principles reviewed.  Recommendations provided when indicated.  Follow up 1 year for next wellness visit.  Orders Placed This Encounter  Procedures  . CBC with Differential  . Comprehensive Metabolic Panel  . Albumin , Random Urine  . CBC with Differential  . Hepatitis C Virus (HCV) Antibody Screen With Confirmation  . Hepatitis C Virus (HCV) Antibody Screen With Confirmation  . Hemoglobin A1C With Estimated Average Glucose    No orders of the defined types were placed in this encounter.   Patient Care Team: Elberta Ebb Cone, NP as PCP - General (Nurse Practitioner) Elberta Ebb Cone, NP as PCP - Attributed  Electronically signed by: Elberta Ebb Cone, NP 03/25/2023 10:24 AM

## 2023-03-29 ENCOUNTER — Ambulatory Visit: Payer: Medicare Other | Attending: Cardiovascular Disease

## 2023-03-29 DIAGNOSIS — I4891 Unspecified atrial fibrillation: Secondary | ICD-10-CM | POA: Insufficient documentation

## 2023-03-29 DIAGNOSIS — Z952 Presence of prosthetic heart valve: Secondary | ICD-10-CM | POA: Insufficient documentation

## 2023-03-29 LAB — POCT INR: INR: 2.7 (ref 2.0–3.0)

## 2023-03-29 NOTE — Patient Instructions (Signed)
Description   Continue taking 1.5 TABLETS DAILY EXCEPT 2 TABLETS ON MONDAY, WEDNESDAY, AND FRIDAY.  Stay consistent with greens each week (2 per week) Recheck INR in 1 week.   Amiodarone 400 mg bid until 11/15, then 200 mg bid, NOW 1 daily-02/22/23. Anticoagulation Clinic [phone]

## 2023-04-08 ENCOUNTER — Ambulatory Visit: Payer: Medicare Other | Attending: Cardiology

## 2023-04-08 DIAGNOSIS — Z952 Presence of prosthetic heart valve: Secondary | ICD-10-CM | POA: Diagnosis present

## 2023-04-08 DIAGNOSIS — I4891 Unspecified atrial fibrillation: Secondary | ICD-10-CM | POA: Insufficient documentation

## 2023-04-08 DIAGNOSIS — Z7901 Long term (current) use of anticoagulants: Secondary | ICD-10-CM | POA: Diagnosis not present

## 2023-04-08 LAB — POCT INR: INR: 2.2 (ref 2.0–3.0)

## 2023-04-08 NOTE — Patient Instructions (Signed)
 Continue taking 1.5 TABLETS DAILY EXCEPT 2 TABLETS ON MONDAY, WEDNESDAY, AND FRIDAY.  Stay consistent with greens each week (2 per week) Recheck INR in 3 weeks.   Amiodarone  400 mg bid until 11/15, then 200 mg bid, NOW 1 daily-02/22/23. Anticoagulation Clinic [phone]

## 2023-04-25 ENCOUNTER — Encounter: Payer: Self-pay | Admitting: Student

## 2023-04-25 ENCOUNTER — Ambulatory Visit: Payer: Medicare Other | Attending: Internal Medicine | Admitting: Student

## 2023-04-25 ENCOUNTER — Telehealth: Payer: Self-pay | Admitting: Pharmacy Technician

## 2023-04-25 ENCOUNTER — Telehealth: Payer: Self-pay | Admitting: Pharmacist

## 2023-04-25 ENCOUNTER — Telehealth: Payer: Self-pay | Admitting: Cardiovascular Disease

## 2023-04-25 ENCOUNTER — Other Ambulatory Visit (HOSPITAL_COMMUNITY): Payer: Self-pay

## 2023-04-25 DIAGNOSIS — E7800 Pure hypercholesterolemia, unspecified: Secondary | ICD-10-CM | POA: Diagnosis present

## 2023-04-25 MED ORDER — PRALUENT 75 MG/ML ~~LOC~~ SOAJ: 75.0000 mg | SUBCUTANEOUS | 3 refills | Status: DC

## 2023-04-25 NOTE — Telephone Encounter (Signed)
Pharmacy Patient Advocate Encounter   Received notification from Pt Calls Messages that prior authorization for praluent is required/requested.   Insurance verification completed.   The patient is insured through [facility] .   Per test claim: PA required; PA submitted to above mentioned insurance via CoverMyMeds Key/confirmation #/EOC [facility] Status is pending

## 2023-04-25 NOTE — Telephone Encounter (Signed)
Pharmacy Patient Advocate Encounter  Received notification from [facility] that Prior Authorization for praluent has been APPROVED from 04/25/23 to 04/01/2098. Ran test claim, Copay is $466.28 one month (DEDUCTIBLE). This test claim was processed through [facility]- copay amounts may vary at other pharmacies due to pharmacy/plan contracts, or as the patient moves through the different stages of their insurance plan.   PA #/Case ID/Reference #: [id number]

## 2023-04-25 NOTE — Telephone Encounter (Signed)
Pt c/o medication issue:  1. Name of Medication:  Ozempic  2. How are you currently taking this medication (dosage and times per day)?   No longer on this medication  3. Are you having a reaction (difficulty breathing--STAT)?   4. What is your medication issue?   Patient stated she was taken off this medication by her PCP as she was put on other medication.  Patient met with Dr. Allena Katz who now recommends patient go back to this medication.  Patient wants Dr. Earmon Phoenix advice on re-starting medication.

## 2023-04-25 NOTE — Assessment & Plan Note (Signed)
Assessment:  LDL goal: < 55 mg/dl last LDLc 89 mg/dl ( 16/10) TG 960 mg/dl (45/40) Tolerates high intensity statins well without any side effects  Diet needs improvement will work on that and started going to gym aim to do 30 min of cardio with some resistance training 5 days per week   Discussed next potential options (Zetia, PCSK-9 inhibitors, bempedoic acid and inclisiran); cost, dosing efficacy, side effects   Plan: Continue taking current medication - Crestor 20 mg twice daily  Got Praluent PA approved start using Praluent 75 mg SQ Q14D  Lifestyle innervations for elevated TG - will work on better BG control that will help to lower TG  Lipid lab due on Apr 23,2025

## 2023-04-25 NOTE — Patient Instructions (Signed)
Your Results:             Your most recent labs Goal  Total Cholesterol 173 < 200  Triglycerides 178 < 150  HDL (happy/good cholesterol) 54 > 40  LDL (lousy/bad cholesterol) 89 < 55   Medication changes: continue taking Crestor and start taking Praluent 75 mg under the skin every 14 days.    Praluent is a cholesterol medication that improved your body's ability to get rid of "bad cholesterol" known as LDL. It can lower your LDL up to 60%. It is an injection that is given under the skin every 2 weeks. The most common side effects of Praluent include runny nose, symptoms of the common cold, rarely flu or flu-like symptoms, back/muscle pain in about 3-4% of the patients, and redness, pain, or bruising at the injection site.     Lab orders: We want to repeat labs after 2-3 months.  We will send you a lab order to remind you once we get closer to that time.

## 2023-04-25 NOTE — Telephone Encounter (Signed)
Spoke with patient and she would like  provider opinion on her restarting  praluent and Ozempic

## 2023-04-25 NOTE — Progress Notes (Signed)
Patient ID: Darlene Kelly                 DOB: November 05, 1950                    MRN: [id number]      HPI: Darlene Kelly is a 73 y.o. female patient referred to lipid clinic by Perlie Gold, PA-C. PMH is significant for hypertension, atrial fibrilation,CAD, T2DM, HLD.  Patient's last lipid lab LDLc was 89 and TG was 178 mg/dl. Given risk factors - CAD,T2DM, HTN her LDLc goal is <55 mg/dl and her TG goal is <409 mg /dl she is on max tolerated stains. Referred to PharmD for PCSK9i.  Patient presented with her husband. Reports she takes and tolerates rosuvastatin 20 mg twice daily well. We reviewed options for lowering LDL cholesterol, including ezetimibe, PCSK-9 inhibitors, bempedoic acid and inclisiran.  Discussed mechanisms of action, dosing, side effects and potential decreases in LDL cholesterol.  Also reviewed cost information and potential options for patient assistance.   Current Medications: rosuvastatin 20 mg twice daily  Intolerances: none  Risk Factors: ,CAD, T2DM, HLD. LDL goal: <55 mg/dl   Diet: not that great lately, but was good around surgery last year fall. Will work on going on heart healthy diet.    Exercise: gym - every day 30 min or 1 mile walking cardio and 15-20 resistance   Family History:   Relation Problem Comments  Mother (Deceased) Heart disease MI at age 28  and COPD      Father (Deceased) Heart disease   Lung cancer died at age 61      Brother Metallurgist)   Brother (Deceased) Heart disease 4 bypasses 1st event in his early 38's      Brother (Deceased)      Social History:  Alcohol: 1 drink once or twice a month  Smoking: quit 40 years ago   Labs: Lipid Panel     Component Value Date/Time   CHOL 173 02/26/2023 0940   TRIG 178 (H) 02/26/2023 0940   HDL 54 02/26/2023 0940   CHOLHDL 3.2 02/26/2023 0940   LDLCALC 89 02/26/2023 0940   LABVLDL 30 02/26/2023 0940    Past Medical History:  Diagnosis Date   Breast cancer (HCC)    Breast cancer of  upper-outer quadrant of right female breast (HCC) 07/16/2014   Coronary artery disease    Diabetes mellitus without complication (HCC)    Heart murmur    Hypertension    Hx. No longer on medicine   Personal history of chemotherapy 2016   Personal history of radiation therapy    Skin cancer     Current Outpatient Medications on File Prior to Visit  Medication Sig Dispense Refill   acetaminophen (TYLENOL) 325 MG tablet Take 2 tablets (650 mg total) by mouth every 6 (six) hours as needed for mild pain (pain score 1-3).     amiodarone (PACERONE) 200 MG tablet Take 200 mg by mouth daily.     amoxicillin (AMOXIL) 500 MG tablet Take 4 tablets (2000 mg) 1 hour prior to dental cleaning/procedure 4 tablet 0   aspirin EC 81 MG tablet Take 81 mg by mouth every evening. Swallow whole.     Coenzyme Q10 (COQ10) 400 MG CAPS Take 400 mg by mouth daily.     Cyanocobalamin (B-12) 3000 MCG CAPS Take 3,000 mcg by mouth daily at 12 noon.     gabapentin (NEURONTIN) 300 MG capsule Take 1 capsule (300  mg total) by mouth 3 (three) times daily. Take a 300 mg capsule three times a day for two weeks Then a 300 mg capsule twice a day for two weeks Then a 300 mg capsule once a day for two weeks (Patient taking differently: Take 300 mg by mouth every evening.) 84 capsule 0   Melatonin 10 MG TABS Take 1 tablet by mouth at bedtime.     metoprolol tartrate (LOPRESSOR) 25 MG tablet Take 0.5 tablets (12.5 mg total) by mouth 2 (two) times daily. Hold for Blood Pressure less than 100 60 tablet 3   rosuvastatin (CRESTOR) 40 MG tablet Take 20 mg by mouth 2 (two) times daily.     vitamin C (ASCORBIC ACID) 500 MG tablet Take 500 mg by mouth daily.     warfarin (COUMADIN) 2 MG tablet TAKE 1.5 TABLETS DAILY EXCEPT 2 TABLETS ON MONDAY, WEDNESDAY, AND FRIDAY OR AS DIRECTED BY ANTICOAGULATION CLINIC. 60 tablet 1   No current facility-administered medications on file prior to visit.    Allergies  Allergen Reactions   Latex Itching,  Dermatitis and Rash   Lipitor [Atorvastatin] Other (See Comments)    Bad leg cramps    Assessment/Plan:  1. Hyperlipidemia -  Problem  Hypercholesterolemia   Hypercholesterolemia Assessment:  LDL goal: < 55 mg/dl last LDLc 89 mg/dl ( 27/25) TG 366 mg/dl (44/03) Tolerates high intensity statins well without any side effects  Diet needs improvement will work on that and started going to gym aim to do 30 min of cardio with some resistance training 5 days per week   Discussed next potential options (Zetia, PCSK-9 inhibitors, bempedoic acid and inclisiran); cost, dosing efficacy, side effects   Plan: Continue taking current medication - Crestor 20 mg twice daily  Got Praluent PA approved start using Praluent 75 mg SQ Q14D  Lifestyle innervations for elevated TG - will work on better BG control that will help to lower TG  Lipid lab due on Apr 23,2025     Thank you,  Carmela Hurt, Pharm.D [facility] [address] [place], [place] [address]  Phone: [phone]; Fax: [phone]

## 2023-04-30 ENCOUNTER — Telehealth: Payer: Self-pay | Admitting: Cardiology

## 2023-04-30 ENCOUNTER — Other Ambulatory Visit: Payer: Self-pay

## 2023-04-30 NOTE — Telephone Encounter (Signed)
Patient noted she canceled her Coumandin check appointment on 1/30 as she will be stopping her Warfarin on 1/31.

## 2023-05-01 NOTE — Telephone Encounter (Signed)
Agree with Praluent. Reviewed PharmD notes. Her LDL should be lower and I think this is the most effective and well-tolerated way to get her cholesterol down.  I don't have a strong feeling about the Ozempic and will leave that decision up to the patient.

## 2023-05-02 ENCOUNTER — Ambulatory Visit: Payer: Medicare Other

## 2023-05-02 NOTE — Telephone Encounter (Signed)
Returned call to patient to provide Dr Earmon Phoenix comments on medication. She states she got the Praluent and will be taking the first shot tomorrow. She is still questioning the Ozempic and will make her on decision on that if her A1C goes up further (currently 5.7).

## 2023-05-07 NOTE — Telephone Encounter (Signed)
PA for Praluent approved see other encounter

## 2023-07-24 LAB — LIPID PANEL
Chol/HDL Ratio: 2.1 ratio (ref 0.0–4.4)
Cholesterol, Total: 171 mg/dL (ref 100–199)
HDL: 80 mg/dL (ref >39)
LDL Chol Calc (NIH): 74 mg/dL (ref 0–99)
Triglycerides: 98 mg/dL (ref 0–149)
VLDL Cholesterol Cal: 17 mg/dL (ref 5–40)

## 2023-07-26 ENCOUNTER — Other Ambulatory Visit: Payer: Self-pay | Admitting: Family Medicine

## 2023-07-26 DIAGNOSIS — Z1231 Encounter for screening mammogram for malignant neoplasm of breast: Secondary | ICD-10-CM

## 2023-07-29 ENCOUNTER — Telehealth: Payer: Self-pay | Admitting: Pharmacist

## 2023-07-29 DIAGNOSIS — E7800 Pure hypercholesterolemia, unspecified: Secondary | ICD-10-CM

## 2023-07-29 MED ORDER — EZETIMIBE 10 MG PO TABS: 10.0000 mg | ORAL_TABLET | Freq: Every day | ORAL | 3 refills | Status: DC

## 2023-07-29 NOTE — Telephone Encounter (Signed)
 Spoke to patient, Praluent  did not lower LDL to the goal. Advised to discontinue Praluent . We will consider Zetia  and nexletol  ( eventually put her on combination pill Nexlizet). Start taking Zetia  10 mg daily with Crestor  20 mg twice daily. Will f/u in 15 days if Zetia  tolerated well we will add Nexletol .  Patient verbalize understanding and appreciate phone call

## 2023-08-16 NOTE — Telephone Encounter (Signed)
 Spoke to pt, tolerates Zetia  well. Will get baseline Uric acid before we switch Zetia  to Nexlizet. Pt will go for lab on Monday 08/19/2023. Nexlizet PA request sent to the pool.

## 2023-08-16 NOTE — Addendum Note (Signed)
 Addended by: Wm Fruchter K on: 08/16/2023 04:40 PM   Modules accepted: Orders

## 2023-08-19 ENCOUNTER — Telehealth: Payer: Self-pay | Admitting: Pharmacy Technician

## 2023-08-19 ENCOUNTER — Other Ambulatory Visit (HOSPITAL_COMMUNITY): Payer: Self-pay

## 2023-08-19 NOTE — Telephone Encounter (Signed)
 Pharmacy Patient Advocate Encounter  Received notification from [employer] that Prior Authorization for Nexlizet has been APPROVED from 08/05/23 to until further notice. Ran test claim, Copay is $153.64- one month. This test claim was processed through [facility]- copay amounts may vary at other pharmacies due to pharmacy/plan contracts, or as the patient moves through the different stages of their insurance plan.   PA #/Case ID/Reference #: [id number]

## 2023-08-19 NOTE — Telephone Encounter (Signed)
 Pharmacy Patient Advocate Encounter   Received notification from Pt Calls Messages-VAISHALI that prior authorization for NEXLIZET is required/requested.   Insurance verification completed.   The patient is insured through [facility] .   Per test claim: PA required; PA submitted to above mentioned insurance via CoverMyMeds Key/confirmation #/EOC [facility] Status is pending

## 2023-08-20 LAB — URIC ACID: Uric Acid: 4.2 mg/dL (ref 3.1–7.9)

## 2023-08-21 MED ORDER — NEXLIZET 180-10 MG PO TABS: 1.0000 | ORAL_TABLET | Freq: Every day | ORAL | 11 refills | Status: AC

## 2023-08-21 NOTE — Telephone Encounter (Signed)
 Spoke to pt, advised to stop Zetia  and start Nexlizet. F/u lab due on 11/12/2023.

## 2023-08-21 NOTE — Addendum Note (Signed)
 Addended by: Owen Pratte K on: 08/21/2023 01:54 PM   Modules accepted: Orders

## 2023-09-04 ENCOUNTER — Ambulatory Visit
Admission: RE | Admit: 2023-09-04 | Discharge: 2023-09-04 | Disposition: A | Discharge status: Home / Self Care | Source: Ambulatory Visit | Attending: Family Medicine | Admitting: Family Medicine

## 2023-09-04 DIAGNOSIS — Z1231 Encounter for screening mammogram for malignant neoplasm of breast: Secondary | ICD-10-CM

## 2023-09-06 ENCOUNTER — Other Ambulatory Visit: Payer: Self-pay | Admitting: Physician Assistant

## 2023-09-06 ENCOUNTER — Other Ambulatory Visit: Payer: Self-pay

## 2023-09-06 MED ORDER — METOPROLOL TARTRATE 25 MG PO TABS: 12.5000 mg | ORAL_TABLET | Freq: Two times a day (BID) | ORAL | 3 refills | Status: DC

## 2023-10-17 ENCOUNTER — Telehealth: Payer: Self-pay | Admitting: Cardiovascular Disease

## 2023-10-17 NOTE — Telephone Encounter (Signed)
 Patient called to follow-up on getting order for Echocardiogram test.

## 2023-10-17 NOTE — Telephone Encounter (Signed)
 Last echo in 03/2023. Pt requesting order for echo. Do you want this repeated at this time?

## 2023-10-21 NOTE — Telephone Encounter (Signed)
 Pt had post-op echo that showed normal heart valve function. She doesn't need another echo at this time. thx

## 2023-10-22 NOTE — Telephone Encounter (Signed)
 Called and spoke with patient. Notified patient that per Dr. Wonda she does not need another echo at this time. Patient scheduled for overdue follow up visit with Dr. Wonda on August 21st.

## 2023-11-01 ENCOUNTER — Telehealth (HOSPITAL_COMMUNITY): Payer: Self-pay

## 2023-11-01 NOTE — Telephone Encounter (Signed)
 Attempted to contact the patient to schedule VAS US .  Yes answer.  Answered, declined scheduling.  First Attempt. Was unable to provide direct contact number for scheduling: [phone].   Patient canceled request due to no longer seeing requesting physician.

## 2023-11-01 NOTE — Telephone Encounter (Signed)
 Garrel,    Looks like she is following up with you later this month.  Could you please follow up on her carotid disease? She is due for carotid duplex. Prior study note LICA <70%.  What I understand is that she does not want the follow duplex as she has changed providers and I had ordered the last study.   Thanks.   Vona Whiters

## 2023-11-11 ENCOUNTER — Other Ambulatory Visit: Payer: Self-pay | Admitting: *Deleted

## 2023-11-11 DIAGNOSIS — E7800 Pure hypercholesterolemia, unspecified: Secondary | ICD-10-CM

## 2023-11-12 ENCOUNTER — Ambulatory Visit: Payer: Self-pay | Admitting: Pharmacist

## 2023-11-12 LAB — LIPID PANEL
Chol/HDL Ratio: 1.9 ratio (ref 0.0–4.4)
Cholesterol, Total: 135 mg/dL (ref 100–199)
HDL: 70 mg/dL (ref >39)
LDL Chol Calc (NIH): 48 mg/dL (ref 0–99)
Triglycerides: 93 mg/dL (ref 0–149)
VLDL Cholesterol Cal: 17 mg/dL (ref 5–40)

## 2023-11-12 NOTE — Telephone Encounter (Signed)
 Lab discussed over the phone, advised to continue with Crestor  40 mg daily ( 20 mg twice daily and Nexlizet  and Praluent  75 mg every 14 days

## 2023-11-21 ENCOUNTER — Encounter: Payer: Self-pay | Admitting: Cardiovascular Disease

## 2023-11-21 ENCOUNTER — Ambulatory Visit: Attending: Cardiovascular Disease | Admitting: Cardiovascular Disease

## 2023-11-21 ENCOUNTER — Other Ambulatory Visit (HOSPITAL_COMMUNITY): Payer: Self-pay

## 2023-11-21 VITALS — BP 166/88 | HR 74 | Ht 61.0 in | Wt 161.4 lb

## 2023-11-21 DIAGNOSIS — I25810 Atherosclerosis of coronary artery bypass graft(s) without angina pectoris: Secondary | ICD-10-CM | POA: Diagnosis present

## 2023-11-21 DIAGNOSIS — E7800 Pure hypercholesterolemia, unspecified: Secondary | ICD-10-CM | POA: Insufficient documentation

## 2023-11-21 DIAGNOSIS — I350 Nonrheumatic aortic (valve) stenosis: Secondary | ICD-10-CM | POA: Insufficient documentation

## 2023-11-21 DIAGNOSIS — I10 Essential (primary) hypertension: Secondary | ICD-10-CM | POA: Diagnosis present

## 2023-11-21 MED ORDER — METOPROLOL SUCCINATE ER 25 MG PO TB24
25.0000 mg | ORAL_TABLET | Freq: Every day | ORAL | 3 refills | Status: AC
  Filled 2023-11-21: qty 90, 90d supply, fill #0
  Filled 2024-02-17: qty 90, 90d supply, fill #1
  Filled 2024-05-05: qty 90, 90d supply, fill #2

## 2023-11-21 NOTE — Assessment & Plan Note (Signed)
 Lipids have responded very well to the addition of Praluent .  Her LDL cholesterol is now 48 mg/dL.  Continue current management.

## 2023-11-21 NOTE — Patient Instructions (Signed)
 Medication Instructions:  STOP Metoprolol  Tartrate   START Metoprolol  Succinate (Toprol -XL) 25 mg once daily  *If you need a refill on your cardiac medications before your next appointment, please call your pharmacy*  Lab Work: None ordered today. If you have labs (blood work) drawn today and your tests are completely normal, you will receive your results only by: MyChart Message (if you have MyChart) OR A paper copy in the mail If you have any lab test that is abnormal or we need to change your treatment, we will call you to review the results.  Testing/Procedures: None ordered today.  Follow-Up: At [facility], you and your health needs are our priority.  As part of our continuing mission to provide you with exceptional heart care, our providers are all part of one team.  This team includes your primary Cardiologist (physician) and Advanced Practice Providers or APPs (Physician Assistants and Nurse Practitioners) who all work together to provide you with the care you need, when you need it.  Your next appointment:   1 year(s)  Provider:   Ozell Fell, MD

## 2023-11-21 NOTE — Assessment & Plan Note (Signed)
 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  Continue aspirin , rosuvastatin , and Praluent .

## 2023-11-21 NOTE — Progress Notes (Signed)
 [facility] Cardiology Office Note:    Date:  11/21/2023   ID:  Darlene Kelly, DOB Aug 04, 1950, MRN [id number]  PCP:  Jesus Elberta Gainer, FNP   [facility] Providers Cardiologist:  Ozell Fell, MD     Referring MD: Jesus Elberta Gainer, *   Chief Complaint  Patient presents with   Coronary Artery Disease    History of Present Illness:    Darlene Kelly is a 73 y.o. female presenting for follow-up of aortic stenosis. In 2024 the patient was referred for evaluation of severe, symptomatic aortic stenosis and she was ultimately found to have severe left main and multivessel CAD as well. She underwent CABG/AVR with a 21 mm Edwards Inspiris Resilia valve and CABG with a LIMA-LAD, SVG-OM, and SVG-RCA on 01/31/2023.   She's here alone today. She reports that she's doing great, feeling better than she's felt in a long time. She's walking 1.5 miles on a regular basis with no limitation. Today, she denies symptoms of palpitations, chest pain, shortness of breath, orthopnea, PND, lower extremity edema, dizziness, or syncope. She felt like her recovery from surgery was uncomplicated and she didn't experience much pain. Home BP readings are reviewed today and they are excellent, ranging on average 120's/70's.    Current Medications: Current Meds  Medication Sig   acetaminophen  (TYLENOL ) 325 MG tablet Take 2 tablets (650 mg total) by mouth every 6 (six) hours as needed for mild pain (pain score 1-3).   amoxicillin  (AMOXIL ) 500 MG tablet Take 4 tablets (2000 mg) 1 hour prior to dental cleaning/procedure   aspirin  EC 81 MG tablet Take 81 mg by mouth every evening. Swallow whole.   Bempedoic Acid -Ezetimibe  (NEXLIZET ) 180-10 MG TABS Take 1 tablet by mouth daily.   Coenzyme Q10 (COQ10) 400 MG CAPS Take 400 mg by mouth daily.   Cyanocobalamin (B-12) 3000 MCG CAPS Take 3,000 mcg by mouth daily at 12 noon.   gabapentin  (NEURONTIN ) 300 MG capsule Take 1 capsule (300 mg total) by  mouth 3 (three) times daily. Take a 300 mg capsule three times a day for two weeks Then a 300 mg capsule twice a day for two weeks Then a 300 mg capsule once a day for two weeks (Patient taking differently: Take 300 mg by mouth every evening.)   Melatonin 10 MG TABS Take 1 tablet by mouth at bedtime.   metoprolol  succinate (TOPROL  XL) 25 MG 24 hr tablet Take 1 tablet (25 mg total) by mouth daily.   PRALUENT  75 MG/ML SOAJ Inject 75 mg into the skin every 14 (fourteen) days.   rosuvastatin  (CRESTOR ) 40 MG tablet Take 20 mg by mouth 2 (two) times daily.   vitamin C (ASCORBIC ACID) 500 MG tablet Take 500 mg by mouth daily.   [DISCONTINUED] metoprolol  tartrate (LOPRESSOR ) 25 MG tablet Take 0.5 tablets (12.5 mg total) by mouth 2 (two) times daily. Hold for Blood Pressure less than 100     Allergies:   Latex and Lipitor [atorvastatin]   ROS:   Please see the history of present illness.    All other systems reviewed and are negative.  EKGs/Labs/Other Studies Reviewed:    The following studies were reviewed today: Cardiac Studies & Procedures   ______________________________________________________________________________________________ CARDIAC CATHETERIZATION  CARDIAC CATHETERIZATION 01/15/2023  Conclusion 1.  Known severe aortic stenosis on noninvasive assessment 2.  Heavy mitral annular calcification 3.  Severe RCA stenosis (large dominant vessel) 4.  Severe distal left main/ostial circumflex stenosis 5.  Moderate eccentric  proximal to mid LAD stenosis  Recommendations: Surgical consultation for CABG/AVR  Findings Coronary Findings Diagnostic  Dominance: Right  Left Main Mid LM to Dist LM lesion is 50% stenosed. The lesion is eccentric. The lesion is calcified.  Left Anterior Descending Ost LAD to Prox LAD lesion is 70% stenosed. The lesion is eccentric. Prox LAD to Mid LAD lesion is 50% stenosed.  Left Circumflex The circumflex has a tight ostial stenosis involving an  eccentric left main lesion extending into the circumflex ostium.  The circumflex supplies a large OM branch that has mild plaquing but no significant stenosis. Ost Cx to Prox Cx lesion is 80% stenosed. Mid Cx lesion is 50% stenosed.  Right Coronary Artery The right coronary artery is a large, dominant vessel.  It supplies a twin PDA and a large posterolateral branch.  The vessel is diffusely calcified.  There is aortic calcification at the ostium of the RCA.  The mid vessel has 70% stenosis followed by a tight 90 to 95% stenosis Prox RCA lesion is 70% stenosed. Mid RCA lesion is 90% stenosed. The lesion is calcified.  Intervention  No interventions have been documented.   STRESS TESTS  PCV MYOCARDIAL PERFUSION WITH LEXISCAN  01/12/2019  Narrative Lexiscan  Myoview  Stress Test 01/12/2019: Stress EKG is non-diagnostic, as this is a pharmacological stress test. In addition, Rest and stress EKG show sinus rhythm, LBBB. SPECT images reveal basal to apical inferolateral, basal inferior, basal inferoseptal, seen both on stress and rest images. While tissue attenuation is present, ischemia in this region cannot be completely excluded.  Stress LVEF is 73%. Low risk study. No prior studies for comparison.   ECHOCARDIOGRAM  ECHOCARDIOGRAM COMPLETE 03/14/2023  Narrative ECHOCARDIOGRAM REPORT    Patient Name:   Darlene Kelly Date of Exam: 03/14/2023 Medical Rec #:  [id number]       Height:       61.0 in Accession #:    [id number]      Weight:       152.0 lb Date of Birth:  1950/05/17       BSA:          1.681 m Patient Age:    72 years        BP:           137/83 mmHg Patient Gender: F               HR:           72 bpm. Exam Location:  [address]  Procedure: 2D Echo, 3D Echo, Cardiac Doppler, Color Doppler and Strain Analysis  Indications:    Z95.2 s/p TAVR  History:        Patient has prior history of Echocardiogram examinations, most recent 12/19/2022. Prior CABG, Breast Cancer,  Arrythmias:Atrial Fibrillation, Signs/Symptoms:Murmur; Risk Factors:Hypertension, HLD and Diabetes. Aortic Valve: 21 mm Inspiris Resilia pericardial valve is present in the aortic position. Procedure Date: 10-31/2024.  Sonographer:    Waldo Guadalajara RCS Referring Phys: [phone] Evelena Masci  IMPRESSIONS   1. Left ventricular ejection fraction, by estimation, is 50 to 55%. Left ventricular ejection fraction by 3D volume is 52 %. The left ventricle has low normal function. The left ventricle has no regional wall motion abnormalities. Left ventricular diastolic parameters are consistent with Grade I diastolic dysfunction (impaired relaxation). Elevated left ventricular end-diastolic pressure. 2. Right ventricular systolic function is mildly reduced. The right ventricular size is normal. There is normal pulmonary artery systolic pressure. The estimated right ventricular systolic pressure  is 25.5 mmHg. 3. Left atrial size was moderately dilated. 4. The mitral valve is degenerative. Mild to moderate mitral valve regurgitation. Mild mitral stenosis. The mean mitral valve gradient is 5.0 mmHg with average heart rate of 67 bpm. Severe mitral annular calcification. 5. The aortic valve has been repaired/replaced. Aortic valve regurgitation is not visualized. There is a 21 mm Inspiris Resilia pericardial valve present in the aortic position. Procedure Date: 10-31/2024. Echo findings are consistent with normal structure and function of the aortic valve prosthesis. Aortic valve area, by VTI measures 1.43 cm. Aortic valve mean gradient measures 14.0 mmHg. Aortic valve Vmax measures 2.41 m/s. Peak gradient 23.2 mmHg, DI is 0.63. 6. The inferior vena cava is normal in size with greater than 50% respiratory variability, suggesting right atrial pressure of 3 mmHg.  Comparison(s): Changes from prior study are noted. 12/19/2022: LVEF 60-65%, subsequent TEE on 01/31/2023 showed LVEF 45-50%.  FINDINGS Left Ventricle:  Left ventricular ejection fraction, by estimation, is 50 to 55%. Left ventricular ejection fraction by 3D volume is 52 %. The left ventricle has low normal function. The left ventricle has no regional wall motion abnormalities. The left ventricular internal cavity size was normal in size. There is no left ventricular hypertrophy. Abnormal (paradoxical) septal motion consistent with post-operative status. Left ventricular diastolic parameters are consistent with Grade I diastolic dysfunction (impaired relaxation). Elevated left ventricular end-diastolic pressure.  Right Ventricle: The right ventricular size is normal. No increase in right ventricular wall thickness. Right ventricular systolic function is mildly reduced. There is normal pulmonary artery systolic pressure. The tricuspid regurgitant velocity is 2.37 m/s, and with an assumed right atrial pressure of 3 mmHg, the estimated right ventricular systolic pressure is 25.5 mmHg.  Left Atrium: Left atrial size was moderately dilated.  Right Atrium: Right atrial size was normal in size.  Pericardium: There is no evidence of pericardial effusion.  Mitral Valve: The mitral valve is degenerative in appearance. There is moderate calcification of the posterior mitral valve leaflet(s). Severe mitral annular calcification. Mild to moderate mitral valve regurgitation, with centrally-directed jet. Mild mitral valve stenosis. MV peak gradient, 11.3 mmHg. The mean mitral valve gradient is 5.0 mmHg with average heart rate of 67 bpm.  Tricuspid Valve: The tricuspid valve is grossly normal. Tricuspid valve regurgitation is trivial.  Aortic Valve: The aortic valve has been repaired/replaced. Aortic valve regurgitation is not visualized. Aortic valve mean gradient measures 14.0 mmHg. Aortic valve peak gradient measures 23.2 mmHg. Aortic valve area, by VTI measures 1.43 cm. There is a 21 mm Inspiris Resilia pericardial valve present in the aortic position.  Procedure Date: 10-31/2024. Echo findings are consistent with normal structure and function of the aortic valve prosthesis.  Pulmonic Valve: The pulmonic valve was normal in structure. Pulmonic valve regurgitation is not visualized.  Aorta: The aortic root and ascending aorta are structurally normal, with no evidence of dilitation.  Venous: The inferior vena cava is normal in size with greater than 50% respiratory variability, suggesting right atrial pressure of 3 mmHg.  IAS/Shunts: No atrial level shunt detected by color flow Doppler.   LEFT VENTRICLE PLAX 2D LVIDd:         4.60 cm         Diastology LVIDs:         3.15 cm         LV e' medial:    6.53 cm/s LV PW:         1.00 cm  LV E/e' medial:  20.2 LV IVS:        0.80 cm         LV e' lateral:   8.27 cm/s LVOT diam:     1.70 cm         LV E/e' lateral: 16.0 LV SV:         59 LV SV Index:   35 LVOT Area:     2.27 cm        3D Volume EF LV 3D EF:    Left ventricul ar ejection fraction by 3D volume is 52 %.  3D Volume EF: 3D EF:        52 % LV EDV:       89 ml LV ESV:       43 ml LV SV:        46 ml  RIGHT VENTRICLE RV Basal diam:  3.50 cm RV S prime:     8.38 cm/s TAPSE (M-mode): 1.4 cm RVSP:           25.5 mmHg  LEFT ATRIUM             Index        RIGHT ATRIUM           Index LA diam:        3.45 cm 2.05 cm/m   RA Pressure: 3.00 mmHg LA Vol (A2C):   70.5 ml 41.94 ml/m  RA Area:     13.20 cm LA Vol (A4C):   56.0 ml 33.32 ml/m  RA Volume:   30.80 ml  18.32 ml/m LA Biplane Vol: 63.8 ml 37.96 ml/m AORTIC VALVE AV Area (Vmax):    0.99 cm AV Area (Vmean):   1.07 cm AV Area (VTI):     1.43 cm AV Vmax:           241.00 cm/s AV Vmean:          173.000 cm/s AV VTI:            0.410 m AV Peak Grad:      23.2 mmHg AV Mean Grad:      14.0 mmHg LVOT Vmax:         105.00 cm/s LVOT Vmean:        81.900 cm/s LVOT VTI:          0.258 m LVOT/AV VTI ratio: 0.63  AORTA Ao Root diam: 2.60 cm Ao Asc diam:   3.00 cm  MITRAL VALVE                  TRICUSPID VALVE MV Area (PHT): 3.53 cm       TR Peak grad:   22.5 mmHg MV Area VTI:   1.29 cm       TR Vmax:        237.00 cm/s MV Peak grad:  11.3 mmHg      Estimated RAP:  3.00 mmHg MV Mean grad:  5.0 mmHg       RVSP:           25.5 mmHg MV Vmax:       1.68 m/s MV Vmean:      104.0 cm/s     SHUNTS MV Decel Time: 215 msec       Systemic VTI:  0.26 m MR Peak grad:    126.3 mmHg   Systemic Diam: 1.70 cm MR Mean grad:    78.0 mmHg MR Vmax:  562.00 cm/s MR Vmean:        417.0 cm/s MR PISA:         3.08 cm MR PISA Eff ROA: 17 mm MR PISA Radius:  0.70 cm MV E velocity: 132.00 cm/s MV A velocity: 143.00 cm/s MV E/A ratio:  0.92  Vinie Maxcy MD Electronically signed by Vinie Maxcy MD Signature Date/Time: 03/14/2023/4:21:51 PM    Final   TEE  ECHO INTRAOPERATIVE TEE 01/31/2023  Narrative *INTRAOPERATIVE TRANSESOPHAGEAL REPORT *    Patient Name:   Darlene Kelly Vernon Date of Exam: 01/31/2023 Medical Rec #:  [id number]       Height:       61.0 in Accession #:    [id number]      Weight:       151.0 lb Date of Birth:  [birth year]       BSA:          1.68 m Patient Age:    72 years        BP:           120/72 mmHg Patient Gender: F               HR:           72 bpm. Exam Location:  Anesthesiology  Transesophogeal exam was perform intraoperatively during surgical procedure. Patient was closely monitored under general anesthesia during the entirety of examination.  Indications:     CABG Aortic stenosis; Valve Disease Performing Phys: [id number] DORISE POUR BARTLE Diagnosing Phys: Kelly Mace MD  Complications: No known complications during this procedure. POST-OP IMPRESSIONS Limited post CPB exam: The patient separated easily from CPB, requiring only low dose Phenylephrine  BP support _ Left Ventricle: The left ventricular function has normalized. There are no wall motion abnormalities, with overall EF 59%. The left ventricle  is essentially unchanged from pre-bypass images. _ Right Ventricle: The right ventricular function appears normal, unchanged from pre-bypass images. _ Aortic Valve: There has been interval replacement of the aortic valve. No stenosis present. There is no regurgitation. Normal washing jets are apparent. The gradient recorded across the prosthetic valve is within the expected range, peak gradient 12 mmHg, mean gradient 7 mmHg. _ Mitral Valve: The mitral valve function appears normal, unchanged from pre-bypass images. There is mild MR. _ Tricuspid Valve: The tricuspid valve function appears normal, unchanged from pre-bypass images.  PRE-OP FINDINGS Left Ventricle: The left ventricle has mildly reduced systolic function, with an ejection fraction of 45-50%, measured 46%. The cavity size was normal. Left ventricular diffuse mild hypokinesis. There is borderline left ventricular hypertrophy. Left ventricular diastolic function was not evaluated.  Right Ventricle: The right ventricle has normal systolic function. The cavity was normal. There is no increase in right ventricular wall thickness. Catheter present in the right ventricle.  Left Atrium: Left atrial size was normal in size. No left atrial/left atrial appendage thrombus was detected. Left atrial appendage velocity is normal at greater than 40 cm/s.  Right Atrium: Right atrial size was normal in size. Catheter present in the right atrium.  Interatrial Septum: No atrial level shunt detected by color flow Doppler. There is no evidence of a patent foramen ovale.  Pericardium: There is no evidence of pericardial effusion.  Mitral Valve: The mitral valve is normal in structure. Mitral valve regurgitation is trivial by color flow Doppler. There is no evidence of mitral valve vegetation. Pulmonary venous flow is normal. There is no evidence of mitral stenosis with mean gradient 2 mmHg,  peak gradient 4 mmHg.  Tricuspid Valve: The tricuspid  valve was normal in structure. Tricuspid valve regurgitation was not visualized by color flow Doppler. No evidence of tricuspid stenosis is present. There is no evidence of tricuspid valve vegetation.  Aortic Valve: The aortic valve is tricuspid. Aortic valve regurgitation is mild by color flow Doppler. There is moderate-severe stenosis of the aortic valve, perhaps underestimated with low normal ventricular function. Mean gradient 32, peak gradient 48 mmHg. There is severe thickening and severe calcifcation present on the aortic valve right coronary, left coronary and non-coronary cusps, with severely decreased leaflet mobility.  Pulmonic Valve: The pulmonic valve was normal in structure, with normal leaflet mobility. No evidence of pulmonic stenosis. Pulmonic valve regurgitation is trivial by color flow Doppler.   Aorta: There is evidence of scattered plaque in the descending aorta, grade 1, more severe, Grade III, measuring 3-69mm in size plaque in the aortic root.  Pulmonary Artery: Norva Purl catheter present on the right. The pulmonary artery is of normal size.  Venous: The inferior vena cava was not well visualized.  Shunts: There is no evidence of an atrial septal defect.  +--------------+--------++ LEFT VENTRICLE         +--------------+--------++ PLAX 2D                +--------------+--------++ LVIDd:        4.22 cm  +--------------+--------++ LVIDs:        3.20 cm  +--------------+--------++ LV PW:        0.48 cm  +--------------+--------++ LVOT diam:    1.80 cm  +--------------+--------++ LV SV:        39 ml    +--------------+--------++ LV SV Index:  22.14    +--------------+--------++ LVOT Area:    2.54 cm +--------------+--------++                        +--------------+--------++  +------------------+------------++ AORTIC VALVE                   +------------------+------------++ AV Area (Vmax):   0.87 cm      +------------------+------------++ AV Area (Vmean):  0.85 cm     +------------------+------------++ AV Area (VTI):    0.82 cm     +------------------+------------++ AV Vmax:          284.67 cm/s  +------------------+------------++ AV Vmean:         214.333 cm/s +------------------+------------++ AV VTI:           0.724 m      +------------------+------------++ AV Peak Grad:     32.4 mmHg    +------------------+------------++ AV Mean Grad:     22.7 mmHg    +------------------+------------++ LVOT Vmax:        97.20 cm/s   +------------------+------------++ LVOT Vmean:       71.900 cm/s  +------------------+------------++ LVOT VTI:         0.233 m      +------------------+------------++ LVOT/AV VTI ratio:0.32         +------------------+------------++  +-------------+---------++ MITRAL VALVE           +--------------+-------+ +-------------+---------++ SHUNTS                MV Peak grad:4.2 mmHg  +--------------+-------+ +-------------+---------++ Systemic VTI: 0.23 m  MV Mean grad:2.0 mmHg  +--------------+-------+ +-------------+---------++ Systemic Diam:1.80 cm MV Vmax:     1.02 m/s  +--------------+-------+ +-------------+---------++ MV Vmean:    61.3 cm/s +-------------+---------++ MV VTI:  0.32 m    +-------------+---------++   Kelly Mace MD Electronically signed by Kelly Mace MD Signature Date/Time: 01/31/2023/2:24:57 PM    Final        ______________________________________________________________________________________________      EKG:        Recent Labs: 02/06/2023: Magnesium  2.1 02/17/2023: ALT 25; BUN 13; Creatinine, Ser 1.16; Hemoglobin 9.5; Platelets 337; Potassium 3.6; Sodium 135  Recent Lipid Panel    Component Value Date/Time   CHOL 135 11/11/2023 0942   TRIG 93 11/11/2023 0942   HDL 70 11/11/2023 0942   CHOLHDL 1.9 11/11/2023 0942    LDLCALC 48 11/11/2023 0942           Physical Exam:    VS:  BP (!) 166/88 (BP Location: Left Arm, Patient Position: Sitting)   Pulse 74   Ht 5' 1 (1.549 m)   Wt 161 lb 6.4 oz (73.2 kg)   SpO2 97%   BMI 30.50 kg/m     Wt Readings from Last 3 Encounters:  11/21/23 161 lb 6.4 oz (73.2 kg)  02/27/23 152 lb (68.9 kg)  02/26/23 152 lb (68.9 kg)     GEN:  Well nourished, well developed in no acute distress HEENT: Normal NECK: No JVD; No carotid bruits LYMPHATICS: No lymphadenopathy CARDIAC: RRR, 2/6 systolic murmur at the right upper sternal border RESPIRATORY:  Clear to auscultation without rales, wheezing or rhonchi  ABDOMEN: Soft, non-tender, non-distended MUSCULOSKELETAL:  No edema; No deformity  SKIN: Warm and dry NEUROLOGIC:  Alert and oriented x 3 PSYCHIATRIC:  Normal affect   Assessment & Plan Nonrheumatic aortic (valve) stenosis The patient has done very well with bioprosthetic aortic valve replacement.  Her postoperative echo shows an LVEF of 50 to 55% and normal function of her aortic valve bioprosthesis with mean gradient of 14 mmHg, valve area of 1.43 cm, and dimensionless index of 0.63.  She will continue with lifelong SBE prophylaxis when indicated.  Continue aspirin  81 mg daily. Coronary artery disease involving coronary bypass graft of native heart without angina pectoris No recurrence of angina since undergoing CABG.  Continue aspirin , rosuvastatin , and Praluent . Hypercholesterolemia Lipids have responded very well to the addition of Praluent .  Her LDL cholesterol is now 48 mg/dL.  Continue current management. Essential hypertension Home blood pressures reviewed and they are in an excellent range.  She is having trouble splitting the small metoprolol  pills in half.  I am going to change her to metoprolol  succinate 25 mg daily.     Medication Adjustments/Labs and Tests Ordered: Current medicines are reviewed at length with the patient today.  Concerns  regarding medicines are outlined above.  No orders of the defined types were placed in this encounter.  Meds ordered this encounter  Medications   metoprolol  succinate (TOPROL  XL) 25 MG 24 hr tablet    Sig: Take 1 tablet (25 mg total) by mouth daily.    Dispense:  90 tablet    Refill:  3    Switching from Lopressor  to Toprol -XL    Patient Instructions  Medication Instructions:  STOP Metoprolol  Tartrate   START Metoprolol  Succinate (Toprol -XL) 25 mg once daily  *If you need a refill on your cardiac medications before your next appointment, please call your pharmacy*  Lab Work: None ordered today. If you have labs (blood work) drawn today and your tests are completely normal, you will receive your results only by: MyChart Message (if you have MyChart) OR A paper copy in the mail If you have any  lab test that is abnormal or we need to change your treatment, we will call you to review the results.  Testing/Procedures: None ordered today.  Follow-Up: At [facility], you and your health needs are our priority.  As part of our continuing mission to provide you with exceptional heart care, our providers are all part of one team.  This team includes your primary Cardiologist (physician) and Advanced Practice Providers or APPs (Physician Assistants and Nurse Practitioners) who all work together to provide you with the care you need, when you need it.  Your next appointment:   1 year(s)  Provider:   Ozell Fell, MD      Signed, Ozell Fell, MD  11/21/2023 11:15 AM    [facility]

## 2023-11-21 NOTE — Assessment & Plan Note (Signed)
 Home blood pressures reviewed and they are in an excellent range.  She is having trouble splitting the small metoprolol  pills in half.  I am going to change her to metoprolol  succinate 25 mg daily.

## 2024-02-17 ENCOUNTER — Other Ambulatory Visit (HOSPITAL_COMMUNITY): Payer: Self-pay

## 2024-03-23 ENCOUNTER — Other Ambulatory Visit: Payer: Self-pay | Admitting: Internal Medicine

## 2024-03-23 DIAGNOSIS — I25810 Atherosclerosis of coronary artery bypass graft(s) without angina pectoris: Secondary | ICD-10-CM

## 2024-03-23 DIAGNOSIS — E7800 Pure hypercholesterolemia, unspecified: Secondary | ICD-10-CM

## 2024-05-05 ENCOUNTER — Other Ambulatory Visit (HOSPITAL_COMMUNITY): Payer: Self-pay
